# Patient Record
Sex: Male | Born: 1960 | Race: White | Hispanic: No | Marital: Married | Smoking: Never smoker
Health system: Southern US, Community
[De-identification: ages and names within clinical notes are randomized; demographics above are authoritative.]

## PROBLEM LIST (undated history)

## (undated) DIAGNOSIS — I639 Cerebral infarction, unspecified: Secondary | ICD-10-CM

## (undated) DIAGNOSIS — R413 Other amnesia: Secondary | ICD-10-CM

## (undated) DIAGNOSIS — M1990 Unspecified osteoarthritis, unspecified site: Secondary | ICD-10-CM

## (undated) DIAGNOSIS — I330 Acute and subacute infective endocarditis: Secondary | ICD-10-CM

## (undated) DIAGNOSIS — D6861 Antiphospholipid syndrome: Secondary | ICD-10-CM

## (undated) DIAGNOSIS — Z953 Presence of xenogenic heart valve: Secondary | ICD-10-CM

## (undated) DIAGNOSIS — M3211 Endocarditis in systemic lupus erythematosus: Secondary | ICD-10-CM

## (undated) DIAGNOSIS — I358 Other nonrheumatic aortic valve disorders: Secondary | ICD-10-CM

## (undated) DIAGNOSIS — I82409 Acute embolism and thrombosis of unspecified deep veins of unspecified lower extremity: Secondary | ICD-10-CM

## (undated) DIAGNOSIS — I825Z9 Chronic embolism and thrombosis of unspecified deep veins of unspecified distal lower extremity: Secondary | ICD-10-CM

## (undated) DIAGNOSIS — D696 Thrombocytopenia, unspecified: Secondary | ICD-10-CM

## (undated) HISTORY — PX: HERNIA REPAIR: SHX51

## (undated) HISTORY — DX: Other amnesia: R41.3

## (undated) HISTORY — DX: Chronic embolism and thrombosis of unspecified deep veins of unspecified distal lower extremity: I82.5Z9

## (undated) HISTORY — DX: Endocarditis in systemic lupus erythematosus: M32.11

## (undated) HISTORY — DX: Other nonrheumatic aortic valve disorders: I35.8

## (undated) HISTORY — DX: Acute and subacute infective endocarditis: I33.0

## (undated) HISTORY — DX: Thrombocytopenia, unspecified: D69.6

## (undated) HISTORY — DX: Antiphospholipid syndrome: D68.61

## (undated) HISTORY — PX: MOUTH SURGERY: SHX715

## (undated) HISTORY — DX: Cerebral infarction, unspecified: I63.9

---

## 1999-02-10 ENCOUNTER — Ambulatory Visit (HOSPITAL_COMMUNITY): Admission: RE | Admit: 1999-02-10 | Discharge: 1999-02-10 | Payer: Self-pay | Admitting: *Deleted

## 1999-02-10 ENCOUNTER — Encounter: Payer: Self-pay | Admitting: *Deleted

## 2003-05-07 ENCOUNTER — Ambulatory Visit (HOSPITAL_COMMUNITY): Admission: RE | Admit: 2003-05-07 | Discharge: 2003-05-07 | Payer: Self-pay | Admitting: *Deleted

## 2008-08-12 ENCOUNTER — Ambulatory Visit (HOSPITAL_COMMUNITY): Admission: RE | Admit: 2008-08-12 | Discharge: 2008-08-12 | Payer: Self-pay | Admitting: Surgery

## 2010-05-09 LAB — HEMOGLOBIN AND HEMATOCRIT, BLOOD
HCT: 46.6 % (ref 39.0–52.0)
Hemoglobin: 16 g/dL (ref 13.0–17.0)

## 2010-06-15 NOTE — Op Note (Signed)
NAME:  JURIEL, CID NO.:  [id number]   MEDICAL RECORD NO.:  [id number]          PATIENT TYPE:  AMB   LOCATION:  DAY                          FACILITY:  [facility]   PHYSICIAN:  Thornton Park. Daphine Deutscher, MD  DATE OF BIRTH:  1960/08/16   DATE OF PROCEDURE:  08/12/2008  DATE OF DISCHARGE:                               OPERATIVE REPORT   PREOPERATIVE DIAGNOSIS:  Left inguinal hernia.   POSTOPERATIVE DIAGNOSIS:  Left direct inguinal hernia.   PROCEDURE:  Left inguinal herniorrhaphy with Ultrapro mesh.   SURGEON:  Dr. Daphine Deutscher.   ASSISTANT:  None.   ANESTHESIA:  General by LMA.   DESCRIPTION OF PROCEDURE:  This is a 50 year old white male brought to  the OR 11 on Tuesday, August 12, 2008, and given general anesthesia.  The  abdomen was clipped by him earlier and then prepped with a CHT  equivalent and draped sterilely.  An oblique incision was made in the  left inguinal region which is where we had marked it preoperatively.  This was carried down to the external oblique.  He had a fairly thick  fatty panniculus, but once we got down there we could feel the bulge  coming out of his cord and incised the external oblique fibers down to  the ring and this revealed a large bulging mass medially.  I dissected  this off the cord and found it to be coming from about a size of a  nickel defect in the floor of his inguinal canal consistent with a large  direct defect.  I went ahead and closed this with a figure-of-eight  suture of 2-0 Prolene to keep everything reduced.  With it reduced, I  then oversewed this area with a piece of Ultrapro II mesh, suturing it  along the inguinal ligament with a running 2-0 Prolene, bringing it  around the cord structures.  It was sutured medially to the internal  oblique and then when it came around it was tucked behind the external  oblique and then sutured to itself with 2-0 Prolene.   The external oblique was then closed with a running 2-0  Vicryl  incorporating the mesh into that closure as well.  This area was  infiltrated with Marcaine and lidocaine and closed with 4-0 Vicryl with  Benzoin and Steri-Strips.  The patient seemed to tolerate the procedure  well.  He was taken to the recovery room in satisfactory condition.   FINAL DIAGNOSIS:  Left direct inguinal artery, status post open repair  with Ultrapro mesh.      Thornton Park Daphine Deutscher, MD  Electronically Signed     MBM/MEDQ  D:  08/12/2008  T:  08/12/2008  Job:  [id number]   cc:   Joycelyn Rua, M.D.  Fax: [phone]

## 2010-06-18 NOTE — Op Note (Signed)
NAME:  WESTIN, KNOTTS                    ACCOUNT NO.:  [id number]   MEDICAL RECORD NO.:  [id number]                   PATIENT TYPE:  AMB   LOCATION:  DAY                                  FACILITY:  [facility]   PHYSICIAN:  Vikki Ports, M.D.         DATE OF BIRTH:  [birth year]   DATE OF PROCEDURE:  05/07/2003  DATE OF DISCHARGE:                                 OPERATIVE REPORT   PREOPERATIVE DIAGNOSES:  Right inguinal hernia.   POSTOPERATIVE DIAGNOSES:  Right inguinal hernia.   PROCEDURE:  Right inguinal hernia repair with mesh.   SURGEON:  Vikki Ports, M.D.   ANESTHESIA:  General.   DESCRIPTION OF PROCEDURE:  The patient was taken to the operating room,  placed in a supine position and after adequate anesthesia was induced using  endotracheal tube, the abdomen was  prepped and draped in the normal sterile  fashion.  Using an oblique incision overlying the inguinal canal, I  dissected down to the external oblique fascia. This was opened along its  fibers.  The ilioinguinal nerve was identified and preserved.  The spermatic  cord was encircled with a Penrose drain at the pubic tubercle.  Using blunt  dissection, the shelving edge of Cooper's ligament and the inguinal ligament  were identified as was the transversalis fascia.  There was a fairly large  direct inguinal hernia which was reduced into the peritoneum and the floor  of Hasselbalch's triangle was loosely closed with #0 Vicryl sutures. A piece  of onlay mesh was then placed over the floor, tacked to the pubic tubercle  to the transversalis fascia and to the inguinal ligament, split and brought  out lateral to the spermatic cord at the internal ring.  It was also tacked  laterally using a running 2-0 Prolene suture. All tissues were injected  using 0.5 Marcaine, spermatic cord was again inspected with no evidence of  indirect hernia sac was seen.  The external oblique fascia was closed with a  running 3-0  Vicryl, skin incision was closed with staples, sterile dressing  was applied.  The patient tolerated the procedure well and went to PACU in  good condition.                                               Vikki Ports, M.D.    KRH/MEDQ  D:  05/07/2003  T:  05/07/2003  Job:  [id number]

## 2015-07-20 ENCOUNTER — Encounter: Payer: Self-pay | Admitting: Sports Medicine

## 2015-07-20 ENCOUNTER — Ambulatory Visit (INDEPENDENT_AMBULATORY_CARE_PROVIDER_SITE_OTHER): Payer: BLUE CROSS/BLUE SHIELD | Admitting: Sports Medicine

## 2015-07-20 VITALS — BP 137/88 | HR 69 | Ht 68.0 in | Wt 182.0 lb

## 2015-07-20 DIAGNOSIS — M79662 Pain in left lower leg: Secondary | ICD-10-CM | POA: Diagnosis not present

## 2015-07-20 NOTE — Progress Notes (Signed)
Patient ID: Brian Lloyd, male   DOB: 11-10-1960, 55 y.o.   MRN: [id number]  CC: L posterior knee/calf pain  HPI: Brian EbbsWarren W Lloyd is a 55 y.o. yo male with noncontributory PMH presenting with 10 days of L posterior knee and calf pain.  Pt states was in normal state of health ten days ago when had to push 250 lb generator up a hill and near the top, felt a sudden twinge or strain in the back of his L knee.  Hurt right away, but proceeded to mow the lawn (walking up and down hills) and that made it increasingly sore.  Back of knee got swollen the next day, L calf was "swollen and hard like a cantaloupe," no bruising, pain has remained but swelling has come down a lot.  Saw PCP several days after it happened, was told "blood clot" was "ruled out," L calf at that time measured 2.5 cm's larger than R.  Today pain is about the same level as when the injury occurred however the swelling is less, still feels like L calf is harder than R.  Able to walk but hurts, also hurts to sit for prolonged periods.  Using compression sleeve, ice, elevation, and hydrocodone when pain gets really bad, all with relief.    Past medical history reviewed Medications reviewed Allergies reviewed  ROS: Gen: - fevers/chills MSK: - except as per HPI Neuro: no change in gait  PE: BP 137/88 mmHg  Pulse 69  Ht 5\' 8"  (1.727 m)  Wt 182 lb (82.555 kg)  BMI 27.68 kg/m2 Gen: 55 y.o. yo male sitting one exam table in NAD HEENT: normocephalic, atraumatic Pulm: normal work of breathing MSK:  LLE: Patient is tender to palpation diffusely along the medial calf. There is mild soft tissue swelling diffusely of the left lower extremity. Lower extremity measures 1 cm larger than the uninvolved right leg. No palpable defect. Good pulses distally. Strength is intact. Negative Homans. Walking with a slight limp.  Imaging:  MSK-US: Limited images of the posterior left knee and calf were obtained. No Baker's cyst. There is  diffuse hypoechoic changes seen within the medial head of the gastrocnemius but no evidence of hematoma formation. Lateral gastrocnemius appears unremarkable.  A/P: Brian Lloyd is a 55 y.o. yo male presenting with L posterior knee/calf pain of uncertain etiology, possible plantaris rupture.    L posterior knee/calf pain Differential includes plantaris rupture, partial calf tear, vs Baker's cyst rupture. No clinical suspicion of DVT given pt presentation. -- Counseled pt that he most likely suffered a plantaris muscle rupture, US findings explained to pt -- Told it takes a month or so to heel, but he can expect full function of his leg afterwards -- Advised to continue using compression sleeve, do not stretch/apply heat to leg -- Provided with 5/16 inch bilateral heel lifts -- Ice and pain medication PRN. Continue with daily Celebrex (patient is on this chronically)  Follow-up: 2 weeks   Francie Massingominick Kirt Chew, MS4  Patient seen and evaluated with the medical student. I agree with the above plan of care. Patient likely suffered a rupture of his plantaris tendon. This is difficult to see on ultrasound. We will continue to treat him with compression, elevation, activity modification, and heel lifts. Follow-up in 2 weeks for reevaluation.

## 2015-08-01 DIAGNOSIS — I825Z2 Chronic embolism and thrombosis of unspecified deep veins of left distal lower extremity: Secondary | ICD-10-CM | POA: Insufficient documentation

## 2015-08-01 DIAGNOSIS — I825Z9 Chronic embolism and thrombosis of unspecified deep veins of unspecified distal lower extremity: Secondary | ICD-10-CM

## 2015-08-01 HISTORY — DX: Chronic embolism and thrombosis of unspecified deep veins of unspecified distal lower extremity: I82.5Z9

## 2015-08-10 ENCOUNTER — Encounter: Payer: Self-pay | Admitting: Sports Medicine

## 2015-08-10 ENCOUNTER — Ambulatory Visit (HOSPITAL_COMMUNITY)
Admission: RE | Admit: 2015-08-10 | Discharge: 2015-08-10 | Disposition: A | Discharge status: Home / Self Care | Payer: BLUE CROSS/BLUE SHIELD | Source: Ambulatory Visit | Attending: Vascular Surgery | Admitting: Vascular Surgery

## 2015-08-10 ENCOUNTER — Ambulatory Visit (INDEPENDENT_AMBULATORY_CARE_PROVIDER_SITE_OTHER): Payer: BLUE CROSS/BLUE SHIELD | Admitting: Sports Medicine

## 2015-08-10 VITALS — BP 124/78 | HR 59 | Ht 68.0 in | Wt 182.0 lb

## 2015-08-10 DIAGNOSIS — I82402 Acute embolism and thrombosis of unspecified deep veins of left lower extremity: Secondary | ICD-10-CM

## 2015-08-10 DIAGNOSIS — M79605 Pain in left leg: Secondary | ICD-10-CM | POA: Diagnosis present

## 2015-08-10 DIAGNOSIS — I82492 Acute embolism and thrombosis of other specified deep vein of left lower extremity: Secondary | ICD-10-CM | POA: Insufficient documentation

## 2015-08-10 DIAGNOSIS — I82412 Acute embolism and thrombosis of left femoral vein: Secondary | ICD-10-CM | POA: Insufficient documentation

## 2015-08-10 DIAGNOSIS — I82432 Acute embolism and thrombosis of left popliteal vein: Secondary | ICD-10-CM | POA: Diagnosis not present

## 2015-08-10 DIAGNOSIS — I82442 Acute embolism and thrombosis of left tibial vein: Secondary | ICD-10-CM | POA: Diagnosis not present

## 2015-08-10 MED ORDER — RIVAROXABAN (XARELTO) VTE STARTER PACK (15 & 20 MG): ORAL_TABLET | ORAL | Status: DC

## 2015-08-10 NOTE — Progress Notes (Signed)
   Subjective:    Patient ID: Brian Lloyd, male    DOB: 18-Dec-1960, 55 y.o.   MRN: [id number]  HPI  L calf pain - Pain is much better - swelling if persistent, below calf and around ankle - Initially injured about 5 wks ago, while lifting generator - Sometimes has tingling related to swelling in leg - Using compression sleeve has helped - swelling worse with standing better with elevation - No h/o bruising, redness in that area  Review of Systems  Constitutional: Negative for fever and chills.  Respiratory: Negative for shortness of breath.   Musculoskeletal: Negative for myalgias and joint swelling.  Skin: Negative for color change and rash.  Neurological: Negative for numbness.      Objective:   Physical Exam  Constitutional: He appears well-developed and well-nourished.  HENT:  Head: Normocephalic and atraumatic.  Cardiovascular:  Pulses:      Dorsalis pedis pulses are 2+ on the right side, and 2+ on the left side.       Posterior tibial pulses are 1+ on the right side, and 1+ on the left side.  Musculoskeletal:       Left lower leg: He exhibits swelling and edema. He exhibits no tenderness, no bony tenderness, no deformity and no laceration.  L calf and ankle are swollen with 1+ pitting edema to mid calf.  L calf is non tender but is more tense than his R calf.  Few varicose veins in b/l LEs.  Negative homans sign.  Full ROM at knee and ankle.        Assessment & Plan:  Left Leg Swelling - Initially injured while lifting.  Pain has resolved but swelling persists - Considering persistent swelling without pain will r/o DVT with doppler today - From a MSK standpoint patient seems to be almost fully recovered from calf strain/muscle tear.  If doppler is negative he can continue wearing his compression sleeve prn and slow get back to normal activity.  - Will call patient with results of doppler  Addendum: Doppler is positive for DVT. I will go ahead and start the  patient on Xarelto and have him follow-up with his primary care physician, Dr. Tommi Lloyd, tomorrow morning. Patient is advised to seek treatment immediately in the emergency room if he develops any acute shortness of breath or chest pain. He understands.

## 2015-10-01 ENCOUNTER — Other Ambulatory Visit: Payer: Self-pay | Admitting: Physician Assistant

## 2015-10-01 DIAGNOSIS — H539 Unspecified visual disturbance: Secondary | ICD-10-CM

## 2015-10-06 ENCOUNTER — Ambulatory Visit (INDEPENDENT_AMBULATORY_CARE_PROVIDER_SITE_OTHER): Payer: BLUE CROSS/BLUE SHIELD

## 2015-10-06 DIAGNOSIS — I639 Cerebral infarction, unspecified: Secondary | ICD-10-CM

## 2015-10-06 DIAGNOSIS — I638 Other cerebral infarction: Secondary | ICD-10-CM

## 2015-10-06 DIAGNOSIS — H539 Unspecified visual disturbance: Secondary | ICD-10-CM

## 2015-10-06 HISTORY — DX: Cerebral infarction, unspecified: I63.9

## 2015-10-06 MED ORDER — GADOBENATE DIMEGLUMINE 529 MG/ML IV SOLN
17.0000 mL | Freq: Once | INTRAVENOUS | Status: AC | PRN
  Administered 2015-10-06: 17 mL via INTRAVENOUS

## 2015-10-07 ENCOUNTER — Other Ambulatory Visit: Payer: Self-pay | Admitting: Physician Assistant

## 2015-10-07 DIAGNOSIS — Z8673 Personal history of transient ischemic attack (TIA), and cerebral infarction without residual deficits: Secondary | ICD-10-CM

## 2015-10-07 DIAGNOSIS — Z86718 Personal history of other venous thrombosis and embolism: Secondary | ICD-10-CM

## 2015-10-08 ENCOUNTER — Other Ambulatory Visit: Payer: Self-pay | Admitting: Physician Assistant

## 2015-10-08 DIAGNOSIS — Z8673 Personal history of transient ischemic attack (TIA), and cerebral infarction without residual deficits: Secondary | ICD-10-CM

## 2015-10-09 ENCOUNTER — Ambulatory Visit (HOSPITAL_BASED_OUTPATIENT_CLINIC_OR_DEPARTMENT_OTHER): Payer: BLUE CROSS/BLUE SHIELD

## 2015-10-09 ENCOUNTER — Ambulatory Visit (HOSPITAL_COMMUNITY)
Admission: RE | Admit: 2015-10-09 | Discharge: 2015-10-09 | Disposition: A | Discharge status: Home / Self Care | Payer: BLUE CROSS/BLUE SHIELD | Source: Ambulatory Visit | Attending: Cardiology | Admitting: Cardiology

## 2015-10-09 ENCOUNTER — Other Ambulatory Visit: Payer: Self-pay

## 2015-10-09 ENCOUNTER — Other Ambulatory Visit: Payer: Self-pay | Admitting: Physician Assistant

## 2015-10-09 DIAGNOSIS — H539 Unspecified visual disturbance: Secondary | ICD-10-CM

## 2015-10-09 DIAGNOSIS — Z86718 Personal history of other venous thrombosis and embolism: Secondary | ICD-10-CM | POA: Diagnosis not present

## 2015-10-09 DIAGNOSIS — R42 Dizziness and giddiness: Secondary | ICD-10-CM | POA: Diagnosis not present

## 2015-10-09 DIAGNOSIS — Z8673 Personal history of transient ischemic attack (TIA), and cerebral infarction without residual deficits: Secondary | ICD-10-CM

## 2015-10-09 DIAGNOSIS — I6523 Occlusion and stenosis of bilateral carotid arteries: Secondary | ICD-10-CM | POA: Diagnosis not present

## 2015-10-09 HISTORY — PX: TRANSTHORACIC ECHOCARDIOGRAM: SHX275

## 2015-10-16 ENCOUNTER — Telehealth: Payer: Self-pay | Admitting: Cardiology

## 2015-10-16 ENCOUNTER — Other Ambulatory Visit (HOSPITAL_COMMUNITY)
Admission: AD | Admit: 2015-10-16 | Discharge: 2015-10-16 | Disposition: A | Discharge status: Home / Self Care | Payer: BLUE CROSS/BLUE SHIELD | Source: Ambulatory Visit | Attending: Physician Assistant | Admitting: Physician Assistant

## 2015-10-16 DIAGNOSIS — Z029 Encounter for administrative examinations, unspecified: Secondary | ICD-10-CM | POA: Diagnosis not present

## 2015-10-16 NOTE — Telephone Encounter (Signed)
Patient had an echocardiogram done with bubble study to evaluate possible TIA/stroke type symptoms. The study was essentially normal with the exception of concerning finding for possible aortic vegetation. Patient has not had any symptoms to suggest endocarditis, but would warrant to assess this aortic valve finding.  We will arrange initial consult visit forroughly Monday morning to schedule TEE.  Bryan Lemmaavid Javana Schey, MD

## 2015-10-16 NOTE — Telephone Encounter (Signed)
DR HARDING SPOKE TO MR HELPER PA CONCERNING PATIENT.  PER DR HARDING, SCHEDULE AN APPOINTMENT FOR 10/19/15. MR HELPER WILL HAVE PATIENT TO GET ABLODD CULTURES.  OUR OFFICE WILL CONTACT PATIENT ABOUT APPOINTMENT

## 2015-10-16 NOTE — Telephone Encounter (Signed)
Called patient and gave him date, time and location of appointment on 10-19-15 at 10:30 a.m.

## 2015-10-19 ENCOUNTER — Encounter: Payer: Self-pay | Admitting: Cardiology

## 2015-10-19 ENCOUNTER — Ambulatory Visit (INDEPENDENT_AMBULATORY_CARE_PROVIDER_SITE_OTHER): Payer: BLUE CROSS/BLUE SHIELD | Admitting: Cardiology

## 2015-10-19 VITALS — BP 122/90 | Ht 68.0 in | Wt 191.2 lb

## 2015-10-19 DIAGNOSIS — I639 Cerebral infarction, unspecified: Secondary | ICD-10-CM

## 2015-10-19 DIAGNOSIS — I825Z2 Chronic embolism and thrombosis of unspecified deep veins of left distal lower extremity: Secondary | ICD-10-CM

## 2015-10-19 DIAGNOSIS — I358 Other nonrheumatic aortic valve disorders: Secondary | ICD-10-CM | POA: Insufficient documentation

## 2015-10-19 DIAGNOSIS — Z01818 Encounter for other preprocedural examination: Secondary | ICD-10-CM

## 2015-10-19 DIAGNOSIS — I359 Nonrheumatic aortic valve disorder, unspecified: Secondary | ICD-10-CM

## 2015-10-19 DIAGNOSIS — M3211 Endocarditis in systemic lupus erythematosus: Secondary | ICD-10-CM

## 2015-10-19 DIAGNOSIS — Z8673 Personal history of transient ischemic attack (TIA), and cerebral infarction without residual deficits: Secondary | ICD-10-CM

## 2015-10-19 HISTORY — DX: Endocarditis in systemic lupus erythematosus: M32.11

## 2015-10-19 LAB — BASIC METABOLIC PANEL
BUN: 21 mg/dL (ref 7–25)
CALCIUM: 9.7 mg/dL (ref 8.6–10.3)
CO2: 27 mmol/L (ref 20–31)
Chloride: 100 mmol/L (ref 98–110)
Creat: 1.11 mg/dL (ref 0.70–1.33)
GLUCOSE: 98 mg/dL (ref 65–99)
Potassium: 4.6 mmol/L (ref 3.5–5.3)
SODIUM: 137 mmol/L (ref 135–146)

## 2015-10-19 NOTE — Assessment & Plan Note (Signed)
Remains on Xarelto. Will defer to PCP, but for now would continue Xarelto until more data comes out from the TEE

## 2015-10-19 NOTE — Progress Notes (Signed)
PCP: Ethel Rana  Clinic Note: Chief Complaint  Patient presents with  . New Evaluation    pt had blood clot after leg injury  . 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 male with a PMH below who presents today for Cardiology evaluation after an echocardiogram performed for stroke evaluation showed an aortic valve lesion. I was notified by his PCP ([name]) of the results of the echocardiogram and we arranged for him to be seen in follow-up for cardiology consultation.  Brian Lloyd has been treated by Dr. Margaretha Sheffield (Sports Med) for L Calf pain following an injury l(from lifting a heavy generator)- torn tendon, complicated by a DVT --> on Xarelto x 2 months now. He has not yet been to neurology for evaluation  Recent Hospitalizations: ? 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 No mass effect or hemorrhage.  2-D echo 10/09/2015: Mild LVH. EF 60-65%. Pseudo-normal grade 2 diastolic dysfunction. 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 TEE recommended.  Carotid Dopplers 10/09/2015: Homogenous plaque in the right carotid with mild intimal thickening on the left. Less than 40% bilateral stenosis. Normal subclavian arteries. Patent vertebral arteries with antegrade flow.  Interval History: Brian Lloyd has been a relatively healthy gentleman until he had his injury a few months ago. Before that he would walk 20-40 minutes a day up and down hills without any major problems. However he just try going back to doing that a week or so ago and really had a hard time getting back to his previous level. The major issue other than his injury was this recent cerebellar infarct that there is now really only limited his vertical visual field depth perception. When  it first happened a few weeks ago, he really noted almost walking with a list that turn in a circle. Since then he has had some vertigo, but no balance issues. Interestingly, he was on Xarelto when he had the stroke.  With the diagnosis of possible aortic valve vegetation, we checked the cultures which are no growth to date. He has not had any signs of endocarditis such as fevers or chills, fatigue, and no bruising or other unusual symptoms besides his stroke.  Restrict the cardiac standpoint, he is relatively a symptomatically Review of Symptoms as follows: No chest pain or shortness of breath with rest or exertion. He is somewhat deconditioned and had a hard time walking this last time. But no other symptoms to suggest angina or exertional dyspnea. No PND, orthopnea or edema. No palpitations, lightheadedness, dizziness, weakness or syncope/near syncope. No further TIA or amaurosis fugax symptoms. No melena, hematochezia, hematuria, or epstaxis. No claudication.  ROS: A comprehensive was performed. Review of Systems  Constitutional: Negative for chills, fever and malaise/fatigue.  HENT: Negative for nosebleeds.   Eyes:       Poor vertical depth perception  Respiratory: Negative for sputum production and wheezing.   Cardiovascular: Positive for leg swelling (still has mild swelling from recent DVT).       Per history of present illness  Gastrointestinal: Negative for heartburn (Depending on what he eats).  Genitourinary: Negative for hematuria.  Musculoskeletal: Negative for falls, joint pain and myalgias.  Neurological: Positive for dizziness (Vertigo from visual perception) and headaches (A bit more  since his stroke than usual.). Negative for seizures and weakness.  Psychiatric/Behavioral: Negative for depression and memory loss. The patient is nervous/anxious (A bit anxious about all these diagnoses coming on.). The patient does not have insomnia.     Past Medical History:  Diagnosis  Date  . 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 HERNIA REPAIR Bilateral   . MOUTH SURGERY     root canal  . TRANSTHORACIC ECHOCARDIOGRAM  10/09/2015   Mild LVH. EF 60-65%. Pseudo-normal grade 2 diastolic dysfunction. 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 TEE recommended.    Prior to Admission medications   Medication Sig Start Date Authorizing Provider  Rivaroxaban 15 & 20 MG TBPK Take as directed on package: Start with one 15mg  tablet by mouth twice a day with food. On Day 22, switch to one 20mg  tablet daily with food. 08/10/15 Ralene Cork, DO    Social History   Social History  . Marital status: Married    Spouse name: N/A  . Number of children: N/A  . Years of education: N/A   Social History Main Topics  . Smoking status: Never Smoker  . Smokeless tobacco: Never Used  . Alcohol use None  . Drug use: Unknown  . Sexual activity: Not Asked   Other Topics Concern  . None   Social History Narrative  . None   Family History  Problem Relation Age of Onset  . Heart disease Father   . Hypertension Father   . Heart disease Maternal Grandfather   . Hypertension Maternal Grandfather   . Diabetes Paternal Grandfather   . Heart disease Paternal Grandfather     Wt Readings from Last 3 Encounters:  10/19/15 191 lb 3.2 oz (86.7 kg)  08/10/15 182 lb (82.6 kg)  07/20/15 182 lb (82.6 kg)    PHYSICAL EXAM BP 122/90 (BP Location: Right Arm, Patient Position: Sitting, Cuff Size: Normal)   Ht 5\' 8"  (1.727 m)   Wt 191 lb 3.2 oz (86.7 kg)   BMI 29.07 kg/m  General appearance: alert, cooperative, appears stated age, no distres2s and borderline obese, otherwise well-nourished and well-groomed. Neck: no adenopathy,  no carotid bruit and no JVD Lungs: clear to auscultation bilaterally, normal percussion bilaterally and non-labored Heart: regular rate and rhythm, S1 & S2 normal, no murmur, click, rub or gallop; nondisplaced PMI Abdomen: soft, non-tender; bowel sounds normal; no masses,  no organomegaly; no HJR Extremities: extremities normal, atraumatic, no cyanosis or edema  Pulses: 2+ and symmetric;  Skin: mobility and turgor normal, no edema, no evidence of bleeding or bruising and no lesions noted Neurologic: Mental status: Alert, oriented, thought content appropriate Cranial nerves: normal (II-XII grossly intact)    Adult ECG Report  Rate: 63 ;  Rhythm: normal sinus rhythm and Normal axis, intervals and durations.;   Narrative Interpretation: Normal EKG   Other studies Reviewed: Additional studies/ records that were reviewed today include:  Recent Labs:  n/a    ASSESSMENT / PLAN: Problem List Items Addressed This Visit    History of CVA (cerebrovascular accident) without residual deficits   Relevant Orders   ECHO TEE   Basic metabolic panel   CBC   Protime-INR   EKG 12-Lead   DVT,  lower extremity, distal, chronic (HCC) (Chronic)    Remains on Xarelto. Will defer to PCP, but for now would continue Xarelto until more data comes out from the TEE      Cerebellar infarct [facility](HCC)    He really has only ever visual disturbance from residual effect of his stroke. Depending on what we see on the TEE, may consider continuing DOAC treatment with Xarelto versus aspirin.  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 Blood cultures are negative to date. HACEK orgainisms not checked, but in the absence of any febrile type illness to suggest endocarditis, this is unlikely. He may have had endocarditis in the past, but the last time he has had a prolonged febrile illness was several years ago. The last time he had any  dental work done was also couple years ago.  Plan: Based on echo report, will order TEE to better assess. It could likely be a myxoma or fibroblastoma. Having a stroke, we will then need to consider future treatment based on these findings. He may even require surgical removal. For now, I will continue on Xarelto until we have the results -- He will hold Xarelto the morning of his TEE.  I anticipate that the mass would be more likely a Myxoma vs. Fibroelastoma as vegetation is less likely in this clinical scenario.  Regardless, with his presentation being related to likely embolism/CVA, surgical resection is likely the next step. (Would consult CT Surgery following TEE if indicated)      Relevant Orders   ECHO TEE   Basic metabolic panel   CBC   Protime-INR   EKG 12-Lead    Other Visit Diagnoses    Pre-op testing       Relevant Orders   ECHO TEE   Basic metabolic panel   CBC   Protime-INR   EKG 12-Lead      Current medicines are reviewed at length with the patient today. (+/- concerns) n/a The following changes have been made: n/a Studies Ordered:   Orders Placed This Encounter  Procedures  . Basic metabolic panel  . CBC  . Protime-INR  . EKG 12-Lead  . ECHO TEE   SCHEDULE TEE AT [facility]  NEED LABS - PT ,CBC, BMP PRIOR TO PROCEDURE  PLEASE HOLD XARELTO THE MORNING OF TEE PROCEDURE  ROV 1 month with APP or Dr. Herbie BaltimoreHarding.   Bryan Lemmaavid Eddison Searls, M.D., M.S. Interventional Cardiologist   Pager # [phone] Phone # [phone] [address] [place], [place] [address]

## 2015-10-19 NOTE — Assessment & Plan Note (Signed)
He really has only ever visual disturbance from residual effect of his stroke. Depending on what we see on the TEE, may consider continuing DOAC treatment with Xarelto versus aspirin.  Defer to PCP/neurology

## 2015-10-19 NOTE — Progress Notes (Signed)
Instructions given

## 2015-10-19 NOTE — Patient Instructions (Addendum)
SCHEDULE TEE AT [facility]  NEED LABS - PT ,CBC, BMP PRIOR TO PROCEDURE  PLEASE HOLD XARELTO THE MORNING OF TEE PROCEDURE Your physician has requested that you have a TEE. During a TEE, sound waves are used to create images of your heart. It provides your doctor with information about the size and shape of your heart and how well your heart's chambers and valves are working. In this test, a transducer is attached to the end of a flexible tube that's guided down your throat and into your esophagus (the tube leading from you mouth to your stomach) to get a more detailed image of your heart. You are not awake for the procedure. Please see the instruction sheet given to you today. For further information please visit [email or web address].   Your physician recommends that you schedule a follow-up appointment in: 1 MONTH WITH EXTENDER OR DR HARDING.

## 2015-10-19 NOTE — Assessment & Plan Note (Addendum)
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 Blood cultures are negative to date. HACEK orgainisms not checked, but in the absence of any febrile type illness to suggest endocarditis, this is unlikely. He may have had endocarditis in the past, but the last time he has had a prolonged febrile illness was several years ago. The last time he had any dental work done was also couple years ago.  Plan: Based on echo report, will order TEE to better assess. It could likely be a myxoma or fibroblastoma. Having a stroke, we will then need to consider future treatment based on these findings. He may even require surgical removal. For now, I will continue on Xarelto until we have the results -- He will hold Xarelto the morning of his TEE.  I anticipate that the mass would be more likely a Myxoma vs. Fibroelastoma as vegetation is less likely in this clinical scenario.  Regardless, with his presentation being related to likely embolism/CVA, surgical resection is likely the next step. (Would consult CT Surgery following TEE if indicated)

## 2015-10-20 LAB — CBC
HCT: 41.3 % (ref 38.5–50.0)
HEMOGLOBIN: 14.2 g/dL (ref 13.2–17.1)
MCH: 29.2 pg (ref 27.0–33.0)
MCHC: 34.4 g/dL (ref 32.0–36.0)
MCV: 85 fL (ref 80.0–100.0)
MPV: 11.1 fL (ref 7.5–12.5)
RBC: 4.86 MIL/uL (ref 4.20–5.80)
RDW: 13.5 % (ref 11.0–15.0)
WBC: 5.1 10*3/uL (ref 3.8–10.8)

## 2015-10-20 LAB — PROTIME-INR
INR: 1.3 — AB
PROTHROMBIN TIME: 13.9 s — AB (ref 9.0–11.5)

## 2015-10-21 ENCOUNTER — Other Ambulatory Visit: Payer: Self-pay | Admitting: *Deleted

## 2015-10-21 DIAGNOSIS — Z01818 Encounter for other preprocedural examination: Secondary | ICD-10-CM

## 2015-10-21 LAB — CULTURE, BLOOD (ROUTINE X 2)
CULTURE: NO GROWTH
Culture: NO GROWTH

## 2015-10-22 ENCOUNTER — Ambulatory Visit (HOSPITAL_COMMUNITY)
Admission: RE | Admit: 2015-10-22 | Discharge: 2015-10-22 | Disposition: A | Discharge status: Home / Self Care | Payer: BLUE CROSS/BLUE SHIELD | Source: Ambulatory Visit | Attending: Internal Medicine | Admitting: Internal Medicine

## 2015-10-22 ENCOUNTER — Encounter (HOSPITAL_COMMUNITY)
Admission: RE | Disposition: A | Discharge status: Home / Self Care | Payer: Self-pay | Source: Ambulatory Visit | Attending: Internal Medicine

## 2015-10-22 ENCOUNTER — Ambulatory Visit (HOSPITAL_BASED_OUTPATIENT_CLINIC_OR_DEPARTMENT_OTHER): Payer: BLUE CROSS/BLUE SHIELD

## 2015-10-22 ENCOUNTER — Encounter (HOSPITAL_COMMUNITY): Payer: Self-pay

## 2015-10-22 DIAGNOSIS — I350 Nonrheumatic aortic (valve) stenosis: Secondary | ICD-10-CM | POA: Insufficient documentation

## 2015-10-22 DIAGNOSIS — I351 Nonrheumatic aortic (valve) insufficiency: Secondary | ICD-10-CM

## 2015-10-22 DIAGNOSIS — Z01818 Encounter for other preprocedural examination: Secondary | ICD-10-CM

## 2015-10-22 DIAGNOSIS — Z86718 Personal history of other venous thrombosis and embolism: Secondary | ICD-10-CM | POA: Insufficient documentation

## 2015-10-22 DIAGNOSIS — Z8673 Personal history of transient ischemic attack (TIA), and cerebral infarction without residual deficits: Secondary | ICD-10-CM | POA: Insufficient documentation

## 2015-10-22 HISTORY — PX: TEE WITHOUT CARDIOVERSION: SHX5443

## 2015-10-22 SURGERY — ECHOCARDIOGRAM, TRANSESOPHAGEAL
Anesthesia: Moderate Sedation

## 2015-10-22 MED ORDER — DIPHENHYDRAMINE HCL 50 MG/ML IJ SOLN
INTRAMUSCULAR | Status: AC
  Filled 2015-10-22: qty 1

## 2015-10-22 MED ORDER — MIDAZOLAM HCL 5 MG/ML IJ SOLN
INTRAMUSCULAR | Status: AC
  Filled 2015-10-22: qty 2

## 2015-10-22 MED ORDER — LIDOCAINE VISCOUS 2 % MT SOLN
OROMUCOSAL | Status: DC | PRN
  Administered 2015-10-22: 10 mL via OROMUCOSAL

## 2015-10-22 MED ORDER — FENTANYL CITRATE (PF) 100 MCG/2ML IJ SOLN
INTRAMUSCULAR | Status: DC | PRN
  Administered 2015-10-22: 25 ug via INTRAVENOUS
  Administered 2015-10-22: 12.5 ug via INTRAVENOUS
  Administered 2015-10-22: 25 ug via INTRAVENOUS

## 2015-10-22 MED ORDER — MIDAZOLAM HCL 10 MG/2ML IJ SOLN
INTRAMUSCULAR | Status: DC | PRN
  Administered 2015-10-22 (×2): 2 mg via INTRAVENOUS
  Administered 2015-10-22: 1 mg via INTRAVENOUS
  Administered 2015-10-22: 2 mg via INTRAVENOUS

## 2015-10-22 MED ORDER — FENTANYL CITRATE (PF) 100 MCG/2ML IJ SOLN
INTRAMUSCULAR | Status: AC
  Filled 2015-10-22: qty 2

## 2015-10-22 MED ORDER — SODIUM CHLORIDE 0.9 % IV SOLN
INTRAVENOUS | Status: DC
  Administered 2015-10-22: 500 mL via INTRAVENOUS

## 2015-10-22 MED ORDER — LIDOCAINE VISCOUS 2 % MT SOLN
OROMUCOSAL | Status: AC
  Filled 2015-10-22: qty 15

## 2015-10-22 NOTE — Discharge Instructions (Signed)

## 2015-10-22 NOTE — Op Note (Signed)
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 There is mild AI MV is normal  No signif MR TV is normal  Mild TR PV is normal  Trace PI LA LAA without masses NO PFO as tested by color doppler and with injection of agitated saline. LVEF is normal Thoracic aorta is normal.

## 2015-10-22 NOTE — Progress Notes (Signed)
  Echocardiogram Echocardiogram Transesophageal has been performed.  Janalyn HarderWest, Mekel Haverstock R 10/22/2015, 8:56 AM

## 2015-10-23 ENCOUNTER — Other Ambulatory Visit: Payer: Self-pay | Admitting: *Deleted

## 2015-10-23 ENCOUNTER — Encounter (HOSPITAL_COMMUNITY): Payer: Self-pay | Admitting: Internal Medicine

## 2015-10-23 ENCOUNTER — Ambulatory Visit (INDEPENDENT_AMBULATORY_CARE_PROVIDER_SITE_OTHER): Payer: BLUE CROSS/BLUE SHIELD | Admitting: Physician Assistant

## 2015-10-23 ENCOUNTER — Encounter: Payer: Self-pay | Admitting: Cardiovascular Disease

## 2015-10-23 VITALS — BP 126/76 | HR 63 | Ht 68.0 in | Wt 190.8 lb

## 2015-10-23 DIAGNOSIS — Z01818 Encounter for other preprocedural examination: Secondary | ICD-10-CM

## 2015-10-23 DIAGNOSIS — Z01811 Encounter for preprocedural respiratory examination: Secondary | ICD-10-CM

## 2015-10-23 DIAGNOSIS — D689 Coagulation defect, unspecified: Secondary | ICD-10-CM | POA: Diagnosis not present

## 2015-10-23 DIAGNOSIS — I82402 Acute embolism and thrombosis of unspecified deep veins of left lower extremity: Secondary | ICD-10-CM

## 2015-10-23 DIAGNOSIS — I6310 Cerebral infarction due to embolism of unspecified precerebral artery: Secondary | ICD-10-CM

## 2015-10-23 DIAGNOSIS — I359 Nonrheumatic aortic valve disorder, unspecified: Secondary | ICD-10-CM | POA: Diagnosis not present

## 2015-10-23 DIAGNOSIS — I358 Other nonrheumatic aortic valve disorders: Secondary | ICD-10-CM

## 2015-10-23 NOTE — Patient Instructions (Addendum)
Your physician has requested that you have a cardiac catheterization. Cardiac catheterization is used to diagnose and/or treat various heart conditions. Doctors may recommend this procedure for a number of different reasons. The most common reason is to evaluate chest pain. Chest pain can be a symptom of coronary artery disease (CAD), and cardiac catheterization can show whether plaque is narrowing or blocking your heart's arteries. This procedure is also used to evaluate the valves, as well as measure the blood flow and oxygen levels in different parts of your heart. For further information please visit [email or web address].   Following your catheterization, you will not be allowed to drive for 3 days.  No lifting, pushing, or pulling greater that 10 pounds is allowed for 1 week.  You will be required to have the following tests prior to the procedure:  1. Blood work-the blood work can be done no more than 7 days prior to the procedure.  It can be done at any [facility] lab.  There is one downstairs on the first floor of this building and one in the [facility] building [phone]([address], [address]).  2. Chest Xray-the chest xray order has [facility].      HOLD YOUR RIVAROXABAN 24 HOURS PRIOR TO THE CATH.  ( The condition that you want to look up is " FIBROELASTOMA" )

## 2015-10-23 NOTE — Progress Notes (Addendum)
Cardiology Office Note    Date:  10/23/2015   ID:  Brian Lloyd, Brian Lloyd [birth year], MRN [id number]  PCP:  Brian Lloyd  Cardiologist:  Dr. Herbie Lloyd  Chief Complaint  Patient presents with  . Follow-up    seen for Dr. Herbie Lloyd    History of Present Illness:  Brian Lloyd is a 55 y.o. male with PMH of DVT and CVA. He has also been treated by Dr. Margaretha Lloyd with sports medicine for left calf pain after injury which resulted in one tendon, complicated by DVT in July 2017. Patient was seen by Dr. Herbie Lloyd on 10/19/2015 for evaluation after echocardiogram performed for stroke evaluation showed aortic valve lesion. 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 MRI obtained on the same day showed small acute to subacute lacunar infarct in the left cerebellum. No mass effect or hemorrhage. 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 Carotid Doppler obtained on 9/80/2017 showed homogeneous plaque in the right carotid with mild intimal thickening on the left, less than 40% bilaterally.   He underwent outpatient transesophageal echocardiogram on 10/22/2015 showed large mobile mass along the ventricular surface of AV measuring 2 x 1 cm, service was very irregular, appears to be attached to known coronary cusp near the base. Suspicious of fibroelastoma. No evidence of thrombosis.  I have discussed with Dr. Herbie Lloyd and the patient and the her wife regarding benefit and risk of the cardiac catheterization. They are willing to proceed, however they have very concerned that this he has had at least 3 labs so far and his platelet level is still unknown at this point as all the blood draws have platelet clumping.    Past Medical History:  Diagnosis Date  . Cerebellar infarct (HCC) 10/06/2015   Cardiac MRI showed  acute to subacute lacunar infarct in the left cerebellum  . 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 HERNIA REPAIR Bilateral   . MOUTH SURGERY     root canal  . TEE WITHOUT CARDIOVERSION N/A 10/22/2015   Procedure: TRANSESOPHAGEAL ECHOCARDIOGRAM (TEE);  Surgeon: Brian Riffle, MD;  Location: [facility] ENDOSCOPY;  Service: Cardiovascular;  Laterality: N/A;  . TRANSTHORACIC ECHOCARDIOGRAM  10/09/2015   Mild LVH. EF 60-65%. Pseudo-normal grade 2 diastolic dysfunction. 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 TEE recommended.    Current Medications: Outpatient Medications Prior to Visit  Medication Sig Dispense Refill  . Rivaroxaban 15 & 20 MG TBPK Take as directed on package: Start with one 15mg  tablet by mouth twice a day with food. On Day 22, switch to one 20mg  tablet daily with food. 51 each 0   No facility-administered medications prior to visit.      Allergies:   Naproxen   Social History   Social History  . Marital status: Married    Spouse name: N/A  . Number of children: N/A  . Years of education: N/A   Social History Main Topics  . Smoking status: Never Smoker  . Smokeless tobacco: Never Used  . Alcohol use None     Comment: rarely  . Drug use: Unknown  . Sexual activity: Not Asked   Other Topics Concern  . None   Social History Narrative  .  None     Family History:  The patient's family history includes Diabetes in his paternal grandfather; Heart disease in his father, maternal grandfather, and paternal grandfather; Hypertension in his father and maternal grandfather.   ROS:   Please see the history of present illness.    ROS All other systems reviewed and are negative.   PHYSICAL EXAM:   VS:  BP 126/76   Pulse 63   Ht 5\' 8"  (1.727 m)   Wt 190 lb 12.8 oz (86.5 kg)   BMI 29.01 kg/m    GEN: Well nourished, well  developed, in no acute distress  HEENT: normal  Neck: no JVD, carotid bruits, or masses Cardiac: RRR; no murmurs, rubs, or gallops,no edema  Respiratory:  clear to auscultation bilaterally, normal work of breathing GI: soft, nontender, nondistended, + BS MS: no deformity or atrophy  Skin: warm and dry, no rash Neuro:  Alert and Oriented x 3, Strength and sensation are intact Psych: euthymic mood, full affect  Wt Readings from Last 3 Encounters:  10/23/15 190 lb 12.8 oz (86.5 kg)  10/19/15 191 lb 3.2 oz (86.7 kg)  08/10/15 182 lb (82.6 kg)      Studies/Labs Reviewed:   EKG:  EKG is not ordered today.    Recent Labs: 10/19/2015: BUN 21; Creat 1.11; Hemoglobin 14.2; Platelets SEE NOTE; Potassium 4.6; Sodium 137   Lipid Panel No results found for: CHOL, TRIG, HDL, CHOLHDL, VLDL, LDLCALC, LDLDIRECT  Additional studies/ records that were reviewed today include:   Echo 10/09/2015 LV EF: 60% -   65%  ------------------------------------------------------------------- Indications:      Z86.73 History of CVA.  BUBBLE STUDY.  ------------------------------------------------------------------- History:   PMH:  Acquired from the patient and from the patient&'s chart.  ------------------------------------------------------------------- Study Conclusions  - Left ventricle: The cavity size was normal. Wall thickness was   increased in a pattern of mild LVH. Systolic function was normal.   The estimated ejection fraction was in the range of 60% to 65%.   Wall motion was normal; there were no regional wall motion   abnormalities. Features are consistent with a pseudonormal left   ventricular filling pattern, with concomitant abnormal relaxation   and increased filling pressure (grade 2 diastolic dysfunction). - Aortic valve: There was a medium-sized, 1.4 cm (L) x 0.6 cm (W)   vegetation on the left ventricular aspect. - Atrial septum: No defect or patent foramen ovale was  identified.  Impressions:  - There is a medium sized mass on the aortic valve that is c/w a   vegetation.   Consider TEE for further evaluation if clinically indicated.   TEE 10/22/2015 - Aortic valve: Large mobile mass along ventricular surface of AV   Measures 2 x 1 cm. Surface is very irregular (frondlike) Appears   to be attached to noncoronary cusp, near base. Base of this cusp   is thickened. Suspicious for fibroelastoma. Mild AI. - Left atrium: No evidence of thrombus in the atrial cavity or   appendage.    ASSESSMENT:    1. Aortic valve mass   2. Pre-op testing   3. Blood clotting disorder (HCC)   4. Pre-op chest exam   5. DVT (deep venous thrombosis), left   6. 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 Aortic valve mass: Found incidentally as part of workup for recent stroke on TTE, TEE performed yesterday was concerning  for fibroelastoma. Apparently this case has been discussed with Dr. Cornelius Moras in the hospital. The plan is for the patient to undergo preoperative cardiac catheterization for assessment of coronary anatomy prior to possible surgery.  - Risk and benefit of procedure explained to the patient who display clear understanding and agree to proceed. Discussed with patient possible procedural risk include bleeding, vascular injury, renal injury, arrythmia, MI, stroke and loss of limb or life.  - We have tentatively arranged for cardiac catheterization while waiting for further assessment of his platelet level. I have personally talked with Arlys John in our cath lab to make sure that the interventional cardiologist is aware that we should NOT cross the valve during cardiac catheterization as this will increase his chance of stroke.  - I have not placed his cath order yet, I plan to do so after I figure out what to do with his platelet on Monday.  - he will need to hold his Xarelto for 24  hour prior to cath and 48 hour prior to any surgery.  2. Possible clotting disorder: Per wife, he has had 3 recent CBC, all shows clumped platelets on the smear therefore unable to assess total platelet level. She is very concerned about this. Although hypercoagulable clotting disorder can potentially cause his recent DVT and stroke, the presence of aortic mass was still warrant surgical consultation. This will unlikely to interfere with his upcoming cath, however I do agree this is something that we need to figure out prior to any possible surgery. I did call [facility] lab today, the representative was planning to call me back after checking with their lab to see what else we can do to assess the platelet level, I did check about 2 hours later, the representative was still waiting on their lab personal to give her advise, however by the end of day, and I still did not receive any phone call. I will forward this note to his PCP. I plan to call the [facility] main laboratory on Monday morning to discuss other options to assess platelet level, if there is no other way of assessing that, I plan to call the on-call hematologist to further discuss this case to see if he needs a hematology consult prior to any surgical workup. Otherwise his white blood cell count and red blood cell count appears to be normal.  3. CVA: As part of workup for stroke, incidentally found to have a mass on the aortic valve.  4. DVT after leg injury in July 2017, this has been treated with Xarelto    Medication Adjustments/Labs and Tests Ordered: Current medicines are reviewed at length with the patient today.  Concerns regarding medicines are outlined above.  Medication changes, Labs and Tests ordered today are listed in the Patient Instructions below. Patient Instructions  Your physician has requested that you have a cardiac catheterization. Cardiac catheterization is used to diagnose and/or treat various heart conditions.  Doctors may recommend this procedure for a number of different reasons. The most common reason is to evaluate chest pain. Chest pain can be a symptom of coronary artery disease (CAD), and cardiac catheterization can show whether plaque is narrowing or blocking your heart's arteries. This procedure is also used to evaluate the valves, as well as measure the blood flow and oxygen levels in different parts of your heart. For further information please visit [email or web address]   Following your catheterization, you will not be allowed to drive for 3 days.  No lifting, pushing, or  pulling greater that 10 pounds is allowed for 1 week.  You will be required to have the following tests prior to the procedure:  1. Blood work-the blood work can be done no more than 7 days prior to the procedure.  It can be done at any [facility] lab.  There is one downstairs on the first floor of this building and one in the [facility] building [phone]).  2. Chest Xray-the chest xray order has already been placed at the [facility] Building.      HOLD YOUR RIVAROXABAN 24 HOURS PRIOR TO THE CATH.  ( The condition that you want to look up is " FIBROELASTOMA" )        Signed, Azalee Course, PA  10/23/2015 11:53 PM    [facility] [address] [place], [place], [place]  [address] Phone: [phone]; Fax: [phone]

## 2015-10-26 ENCOUNTER — Other Ambulatory Visit: Payer: Self-pay | Admitting: Physician Assistant

## 2015-10-26 ENCOUNTER — Ambulatory Visit
Admission: RE | Admit: 2015-10-26 | Discharge: 2015-10-26 | Disposition: A | Discharge status: Home / Self Care | Payer: BLUE CROSS/BLUE SHIELD | Source: Ambulatory Visit | Attending: Physician Assistant | Admitting: Physician Assistant

## 2015-10-26 DIAGNOSIS — I358 Other nonrheumatic aortic valve disorders: Secondary | ICD-10-CM

## 2015-10-26 DIAGNOSIS — Z01811 Encounter for preprocedural respiratory examination: Secondary | ICD-10-CM

## 2015-10-26 DIAGNOSIS — D691 Qualitative platelet defects: Secondary | ICD-10-CM

## 2015-10-26 LAB — CBC
HEMATOCRIT: 40 % (ref 38.5–50.0)
HEMOGLOBIN: 14.1 g/dL (ref 13.2–17.1)
MCH: 29.7 pg (ref 27.0–33.0)
MCHC: 35.3 g/dL (ref 32.0–36.0)
MCV: 84.4 fL (ref 80.0–100.0)
RBC: 4.74 MIL/uL (ref 4.20–5.80)
RDW: 13.6 % (ref 11.0–15.0)
WBC: 4.3 10*3/uL (ref 3.8–10.8)

## 2015-10-26 NOTE — H&P (Signed)
    Primary Physician: Primary Cardiologist:  Herbie BaltimoreHarding    HPI: Pt rpresents for TEE Hix of DVT then CVA (sept 5)  Pt on Xarelto when had CVA    Pt hospitalized   Discharged home  No new neuro complaints       Past Medical History:  Diagnosis Date  . 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       Infusions:   Allergies  Allergen Reactions  . Naproxen Anaphylaxis    Social History   Social History  . Marital status: Married    Spouse name: N/A  . Number of children: N/A  . Years of education: N/A   Occupational History  . Not on file.   Social History Main Topics  . Smoking status: Never Smoker  . Smokeless tobacco: Never Used  . Alcohol use Not on file     Comment: rarely  . Drug use: Unknown  . Sexual activity: Not on file   Other Topics Concern  . Not on file   Social History Narrative  . No narrative on file    Family History  Problem Relation Age of Onset  . Heart disease Father   . Hypertension Father   . Heart disease Maternal Grandfather   . Hypertension Maternal Grandfather   . Diabetes Paternal Grandfather   . Heart disease Paternal Grandfather     REVIEW OF SYSTEMS:  All systems reviewed  Negative to the above problem except as noted above.    PHYSICAL EXAM: Vitals:   10/22/15 0855 10/22/15 0905  BP: 116/75 123/77  Pulse: (!) 59 62  Resp: 17 17  Temp:      No intake or output data in the 24 hours ending 10/26/15 2252  General:  Well appearing. No respiratory difficulty HEENT: normal Neck: supple. no JVD. Carotids 2+ bilat; no bruits. No lymphadenopathy or thryomegaly appreciated. Cor: PMI nondisplaced. Regular rate & rhythm. No rubs, gallops or murmurs. Lungs: clear Abdomen: soft, nontender, nondistended. No  hepatosplenomegaly. No bruits or masses. Good bowel sounds. Extremities: no cyanosis, clubbing, rash, edema Neuro  Deferred .  ECG:  No EKG  On tele:  SR    No results found for this or any previous visit (from the past 24 hour(s)). Dg Chest 2 View  Result Date: 10/26/2015 CLINICAL DATA:  Preop heart catheterization. EXAM: CHEST  2 VIEW COMPARISON:  None. FINDINGS: The heart size and mediastinal contours are within normal limits. Both lungs are clear. The visualized skeletal structures are unremarkable. IMPRESSION: No active cardiopulmonary disease. Electronically Signed   By: Elige KoHetal  Patel   On: 10/26/2015 12:24     ASSESSMENT:   PT is a 55 yo with recent CVA  Presetns for TEE  Procedure explained  Risks/benefits  Pt understands and agrees to proceed

## 2015-10-27 ENCOUNTER — Other Ambulatory Visit (HOSPITAL_COMMUNITY)
Admission: RE | Admit: 2015-10-27 | Discharge: 2015-10-27 | Disposition: A | Discharge status: Home / Self Care | Payer: BLUE CROSS/BLUE SHIELD | Source: Ambulatory Visit | Attending: Cardiology | Admitting: Cardiology

## 2015-10-27 ENCOUNTER — Telehealth: Payer: Self-pay | Admitting: Physician Assistant

## 2015-10-27 ENCOUNTER — Telehealth: Payer: Self-pay | Admitting: Pharmacist

## 2015-10-27 ENCOUNTER — Other Ambulatory Visit: Payer: Self-pay | Admitting: *Deleted

## 2015-10-27 ENCOUNTER — Other Ambulatory Visit: Payer: Self-pay | Admitting: Physician Assistant

## 2015-10-27 DIAGNOSIS — D691 Qualitative platelet defects: Secondary | ICD-10-CM | POA: Insufficient documentation

## 2015-10-27 LAB — BASIC METABOLIC PANEL
BUN: 18 mg/dL (ref 7–25)
CALCIUM: 9.8 mg/dL (ref 8.6–10.3)
CO2: 26 mmol/L (ref 20–31)
Chloride: 103 mmol/L (ref 98–110)
Creat: 1.27 mg/dL (ref 0.70–1.33)
GLUCOSE: 99 mg/dL (ref 65–99)
Potassium: 4.2 mmol/L (ref 3.5–5.3)
SODIUM: 136 mmol/L (ref 135–146)

## 2015-10-27 LAB — APTT: aPTT: 90 s (ref 22–34)

## 2015-10-27 LAB — PROTIME-INR
INR: 1.4 — ABNORMAL HIGH
Prothrombin Time: 14.4 s — ABNORMAL HIGH (ref 9.0–11.5)

## 2015-10-27 LAB — PLATELET COUNT: PLATELETS: 65 10*3/uL — AB (ref 150–400)

## 2015-10-27 NOTE — Telephone Encounter (Signed)
[facility] calling to report critical value -   aPTT - 90  INR -1.4  Was verified on repeat analysis. Results being faxed.   Will route to [facility], [place] as it appears he has already addressed this result and he was the ordering provider.

## 2015-10-27 NOTE — Telephone Encounter (Signed)
Patient had multiple CBC with clumped platelet resulting in unknown platelet level. I discussed with our [facility] which is part of [facility] about this case on 9/25. It was felt a group of people will have problem with clumping when their blood is collected via the traditional EDTA tube (lavender covered top). Even so, CBC obtained on 9/25 says "platelet clumps noted on smear - count appears adequate". After discussing with lab personal, it was recommended to collect samples in citrated tube which prevent clumping. (also note [facility] lab could not run citrated sample) Lab today shows platelet of 65.  Call the patient to report result. Also discussed with on-call hematologist Dr. Truett PernaSherrill and on-call pathologist Dr. Laureen OchsSmir. Both expressed the fact that platelet clumping alone could be normal variant and does not necessarily mean coagulation disorder, however that does not explain why the platelet is 65. Although there are some cases suggest platelet level obtained form citrated blood could be lower than normal, however per both hematologist and pathologist, from their experience, it should be pretty close if not the same as the true level, therefore a platelet of 65 is still lower than expected. No clumped were documented in this citrated blood work to explain the low number and result was reviewed via smear.   Per both hematology and pathology, repeat platelet level is recommended before surgery. Hematology recommended obtain 3 tubes of blood (CBC, platelet count in citrated blood and platelet count in heparin tube so they can do comparison). Pathologist recommended 2 tubes of blood (CBC and platelet count in citrated blood) and felt citrated tube is enough to replace the heparin tube.  Since overall platelet count is > 50, it is not prohibitive of upcoming diagnostic cath, however this lab should be repeated on the day of cath as this maybe important for upcoming cardiothoracic surgery. Also his  APTT yesterday was 90, his last dose of Xarelto was this morning. He is holding further dose for cath.  Our plan is obtain stat CBC (including platelet in EDTA tube), obtain citrated platelet in blue top tube, and also obtain platelet in heparin tube (if doable here) in the morning of cath. Also obtain APTT as well. If platelet still low, consider have hematology see him here before he leave the hospital in order to facilitate the workup for CT surgery.  Ramond DialSigned, Shayona Hibbitts PA Pager: [phone]

## 2015-10-27 NOTE — Telephone Encounter (Signed)
He is on your schedule for cath this Thursday. Patient was on Xarelto when he took his lab, I think clinical study of Xarelto shows it has nonlinear dose relationship with APTT, do you want him to have a repeat on the day of cath or do you think it will not interfere with the lab result?

## 2015-10-27 NOTE — Telephone Encounter (Addendum)
Discussed with Dr. Clifton JamesMcAlhany, no need to repeat APTT, will cancel APTT lab.

## 2015-10-27 NOTE — Telephone Encounter (Signed)
We do not need to repeat the PTT before his cath. Thanks, chris

## 2015-10-28 ENCOUNTER — Telehealth: Payer: Self-pay | Admitting: Cardiology

## 2015-10-28 NOTE — Telephone Encounter (Signed)
Plan is to recheck platelets tomorrow.  This is not an issue for having cath. If level is still "low", plan is to have Heme-Onc see potentially while in Short Stay.  Bryan Lemmaavid Harding, MD

## 2015-10-28 NOTE — Telephone Encounter (Signed)
Spoke to patient.  PLATELET COUNT Result given . Verbalized understanding  Patient pose the question of what is the surgeon cut off point for surgery in regards to platelet count.  Patient concerned about having cath to soon if platelet count is not improved, should hematology evaluate before cath.  Patient has an appointment with Dr Barry Dieneswens on 11/03/15 Tuesday 4pm.   RN informed patient that Dr Herbie BaltimoreHarding may have to dscuss with Dr Barry Dieneswens.  RN inform a patient will defer to Dr Herbie BaltimoreHarding and contact

## 2015-10-28 NOTE — Telephone Encounter (Signed)
Please call,pt is scheduled for Cath tomorrow. He needs to ask some questions about this asap.

## 2015-10-28 NOTE — Telephone Encounter (Signed)
No answer, left message on answer machine to call back

## 2015-10-29 ENCOUNTER — Telehealth: Payer: Self-pay | Admitting: Physician Assistant

## 2015-10-29 ENCOUNTER — Encounter (HOSPITAL_COMMUNITY): Payer: Self-pay | Admitting: *Deleted

## 2015-10-29 ENCOUNTER — Encounter (HOSPITAL_COMMUNITY)
Admission: RE | Disposition: A | Discharge status: Home / Self Care | Payer: Self-pay | Source: Ambulatory Visit | Attending: Cardiovascular Disease

## 2015-10-29 ENCOUNTER — Ambulatory Visit (HOSPITAL_COMMUNITY)
Admission: RE | Admit: 2015-10-29 | Discharge: 2015-10-29 | Disposition: A | Discharge status: Home / Self Care | Payer: BLUE CROSS/BLUE SHIELD | Source: Ambulatory Visit | Attending: Cardiovascular Disease | Admitting: Cardiovascular Disease

## 2015-10-29 DIAGNOSIS — Z8249 Family history of ischemic heart disease and other diseases of the circulatory system: Secondary | ICD-10-CM | POA: Insufficient documentation

## 2015-10-29 DIAGNOSIS — Z86718 Personal history of other venous thrombosis and embolism: Secondary | ICD-10-CM | POA: Insufficient documentation

## 2015-10-29 DIAGNOSIS — D696 Thrombocytopenia, unspecified: Secondary | ICD-10-CM

## 2015-10-29 DIAGNOSIS — Z01818 Encounter for other preprocedural examination: Secondary | ICD-10-CM

## 2015-10-29 DIAGNOSIS — Z833 Family history of diabetes mellitus: Secondary | ICD-10-CM | POA: Diagnosis not present

## 2015-10-29 DIAGNOSIS — D689 Coagulation defect, unspecified: Secondary | ICD-10-CM | POA: Insufficient documentation

## 2015-10-29 DIAGNOSIS — Z8673 Personal history of transient ischemic attack (TIA), and cerebral infarction without residual deficits: Secondary | ICD-10-CM | POA: Diagnosis not present

## 2015-10-29 DIAGNOSIS — I359 Nonrheumatic aortic valve disorder, unspecified: Secondary | ICD-10-CM | POA: Insufficient documentation

## 2015-10-29 DIAGNOSIS — Z7901 Long term (current) use of anticoagulants: Secondary | ICD-10-CM | POA: Insufficient documentation

## 2015-10-29 HISTORY — DX: Acute embolism and thrombosis of unspecified deep veins of unspecified lower extremity: I82.409

## 2015-10-29 HISTORY — PX: CARDIAC CATHETERIZATION: SHX172

## 2015-10-29 LAB — CBC
HCT: 40.2 % (ref 39.0–52.0)
Hemoglobin: 14.3 g/dL (ref 13.0–17.0)
MCH: 30 pg (ref 26.0–34.0)
MCHC: 35.6 g/dL (ref 30.0–36.0)
MCV: 84.5 fL (ref 78.0–100.0)
PLATELETS: 66 10*3/uL — AB (ref 150–400)
RBC: 4.76 MIL/uL (ref 4.22–5.81)
RDW: 12.8 % (ref 11.5–15.5)
WBC: 5.2 10*3/uL (ref 4.0–10.5)

## 2015-10-29 LAB — APTT: aPTT: 74 seconds — ABNORMAL HIGH (ref 24–36)

## 2015-10-29 SURGERY — CORONARY/GRAFT ANGIOGRAPHY

## 2015-10-29 MED ORDER — HEPARIN SODIUM (PORCINE) 1000 UNIT/ML IJ SOLN
INTRAMUSCULAR | Status: DC | PRN
  Administered 2015-10-29: 4500 [IU] via INTRAVENOUS

## 2015-10-29 MED ORDER — SODIUM CHLORIDE 0.9% FLUSH: 3.0000 mL | INTRAVENOUS | Status: DC | PRN

## 2015-10-29 MED ORDER — HEPARIN (PORCINE) IN NACL 2-0.9 UNIT/ML-% IJ SOLN
INTRAMUSCULAR | Status: DC | PRN
  Administered 2015-10-29: 1000 mL

## 2015-10-29 MED ORDER — ASPIRIN 81 MG PO CHEW
CHEWABLE_TABLET | ORAL | Status: AC
  Filled 2015-10-29: qty 1

## 2015-10-29 MED ORDER — SODIUM CHLORIDE 0.9 % IV SOLN
INTRAVENOUS | Status: DC
  Administered 2015-10-29: 09:00:00 via INTRAVENOUS

## 2015-10-29 MED ORDER — SODIUM CHLORIDE 0.9 % IV SOLN: 250.0000 mL | INTRAVENOUS | Status: DC | PRN

## 2015-10-29 MED ORDER — SODIUM CHLORIDE 0.9 % IV SOLN: INTRAVENOUS | Status: AC

## 2015-10-29 MED ORDER — VERAPAMIL HCL 2.5 MG/ML IV SOLN
INTRAVENOUS | Status: AC
  Filled 2015-10-29: qty 2

## 2015-10-29 MED ORDER — FENTANYL CITRATE (PF) 100 MCG/2ML IJ SOLN
INTRAMUSCULAR | Status: AC
  Filled 2015-10-29: qty 2

## 2015-10-29 MED ORDER — MIDAZOLAM HCL 2 MG/2ML IJ SOLN
INTRAMUSCULAR | Status: AC
  Filled 2015-10-29: qty 2

## 2015-10-29 MED ORDER — SODIUM CHLORIDE 0.9% FLUSH: 3.0000 mL | Freq: Two times a day (BID) | INTRAVENOUS | Status: DC

## 2015-10-29 MED ORDER — IOPAMIDOL (ISOVUE-370) INJECTION 76%
INTRAVENOUS | Status: DC | PRN
  Administered 2015-10-29: 40 mL via INTRA_ARTERIAL

## 2015-10-29 MED ORDER — HEPARIN SODIUM (PORCINE) 1000 UNIT/ML IJ SOLN
INTRAMUSCULAR | Status: AC
  Filled 2015-10-29: qty 1

## 2015-10-29 MED ORDER — IOPAMIDOL (ISOVUE-370) INJECTION 76%
INTRAVENOUS | Status: AC
  Filled 2015-10-29: qty 100

## 2015-10-29 MED ORDER — VERAPAMIL HCL 2.5 MG/ML IV SOLN
INTRAVENOUS | Status: DC | PRN
  Administered 2015-10-29: 10 mL via INTRA_ARTERIAL

## 2015-10-29 MED ORDER — HEPARIN (PORCINE) IN NACL 2-0.9 UNIT/ML-% IJ SOLN
INTRAMUSCULAR | Status: AC
  Filled 2015-10-29: qty 1000

## 2015-10-29 MED ORDER — LIDOCAINE HCL (PF) 1 % IJ SOLN
INTRAMUSCULAR | Status: AC
  Filled 2015-10-29: qty 30

## 2015-10-29 MED ORDER — HEPARIN (PORCINE) IN NACL 2-0.9 UNIT/ML-% IJ SOLN
INTRAMUSCULAR | Status: AC
  Filled 2015-10-29: qty 500

## 2015-10-29 MED ORDER — MIDAZOLAM HCL 2 MG/2ML IJ SOLN
INTRAMUSCULAR | Status: DC | PRN
  Administered 2015-10-29: 1 mg via INTRAVENOUS
  Administered 2015-10-29: 2 mg via INTRAVENOUS

## 2015-10-29 MED ORDER — LIDOCAINE HCL (PF) 1 % IJ SOLN
INTRAMUSCULAR | Status: DC | PRN
  Administered 2015-10-29: 3 mL

## 2015-10-29 MED ORDER — FENTANYL CITRATE (PF) 100 MCG/2ML IJ SOLN
INTRAMUSCULAR | Status: DC | PRN
  Administered 2015-10-29: 25 ug via INTRAVENOUS
  Administered 2015-10-29: 50 ug via INTRAVENOUS

## 2015-10-29 SURGICAL SUPPLY — 9 items
CATH IMPULSE 5F ANG/FL3.5 (CATHETERS) ×2 IMPLANT
DEVICE RAD COMP TR BAND LRG (VASCULAR PRODUCTS) ×2 IMPLANT
GLIDESHEATH SLEND SS 6F .021 (SHEATH) ×2 IMPLANT
KIT HEART LEFT (KITS) ×2 IMPLANT
PACK CARDIAC CATHETERIZATION (CUSTOM PROCEDURE TRAY) ×2 IMPLANT
TRANSDUCER W/STOPCOCK (MISCELLANEOUS) ×2 IMPLANT
TUBING CIL FLEX 10 FLL-RA (TUBING) ×2 IMPLANT
WIRE HI TORQ VERSACORE-J 145CM (WIRE) ×2 IMPLANT
WIRE SAFE-T 1.5MM-J .035X260CM (WIRE) ×2 IMPLANT

## 2015-10-29 NOTE — Interval H&P Note (Signed)
History and Physical Interval Note:  10/29/2015 9:21 AM  Brian Lloyd  has presented today for cardiac cath with the diagnosis of aortic valve mass, pre-op.The various methods of treatment have been discussed with the patient and family. After consideration of risks, benefits and other options for treatment, the patient has consented to  Procedure(s): Left Heart Cath and Coronary Angiography (N/A) as a surgical intervention .  The patient's history has been reviewed, patient examined, no change in status, stable for surgery.  I have reviewed the patient's chart and labs.  Questions were answered to the patient's satisfaction.    Cath Lab Visit (complete for each Cath Lab visit)  Clinical Evaluation Leading to the Procedure:   ACS: No.  Non-ACS:    Anginal Classification: No Symptoms  Anti-ischemic medical therapy: No Therapy  Non-Invasive Test Results: No non-invasive testing performed  Prior CABG: No previous CABG         Verne Carrowhristopher Kamarie Palma

## 2015-10-29 NOTE — Discharge Instructions (Signed)
Radial Site Care °Refer to this sheet in the next few weeks. These instructions provide you with information about caring for yourself after your procedure. Your health care provider may also give you more specific instructions. Your treatment has been planned according to current medical practices, but problems sometimes occur. Call your health care provider if you have any problems or questions after your procedure. °WHAT TO EXPECT AFTER THE PROCEDURE °After your procedure, it is typical to have the following: °· Bruising at the radial site that usually fades within 1-2 weeks. °· Blood collecting in the tissue (hematoma) that may be painful to the touch. It should usually decrease in size and tenderness within 1-2 weeks. °HOME CARE INSTRUCTIONS °· Take medicines only as directed by your health care provider. °· You may shower 24-48 hours after the procedure or as directed by your health care provider. Remove the bandage (dressing) and gently wash the site with plain soap and water. Pat the area dry with a clean towel. Do not rub the site, because this may cause bleeding. °· Do not take baths, swim, or use a hot tub until your health care provider approves. °· Check your insertion site every day for redness, swelling, or drainage. °· Do not apply powder or lotion to the site. °· Do not flex or bend the affected arm for 24 hours or as directed by your health care provider. °· Do not push or pull heavy objects with the affected arm for 24 hours or as directed by your health care provider. °· Do not lift over 10 lb (4.5 kg) for 5 days after your procedure or as directed by your health care provider. °· Ask your health care provider when it is okay to: °¨ Return to work or school. °¨ Resume usual physical activities or sports. °¨ Resume sexual activity. °· Do not drive home if you are discharged the same day as the procedure. Have someone else drive you. °· You may drive 24 hours after the procedure unless otherwise  instructed by your health care provider. °· Do not operate machinery or power tools for 24 hours after the procedure. °· If your procedure was done as an outpatient procedure, which means that you went home the same day as your procedure, a responsible adult should be with you for the first 24 hours after you arrive home. °· Keep all follow-up visits as directed by your health care provider. This is important. °SEEK MEDICAL CARE IF: °· You have a fever. °· You have chills. °· You have increased bleeding from the radial site. Hold pressure on the site. °SEEK IMMEDIATE MEDICAL CARE IF: °· You have unusual pain at the radial site. °· You have redness, warmth, or swelling at the radial site. °· You have drainage (other than a small amount of blood on the dressing) from the radial site. °· The radial site is bleeding, and the bleeding does not stop after 30 minutes of holding steady pressure on the site. °· Your arm or hand becomes pale, cool, tingly, or numb. °  °This information is not intended to replace advice given to you by your health care provider. Make sure you discuss any questions you have with your health care provider. °  °Document Released: 02/19/2010 Document Revised: 02/07/2014 Document Reviewed: 08/05/2013 °Elsevier Interactive Patient Education ©2016 [employer] ° °

## 2015-10-29 NOTE — Progress Notes (Signed)
Pt does not need aspirin pre cath per Victorino DikeJennifer, RN.

## 2015-10-29 NOTE — H&P (View-Only) (Signed)
Cardiology Office Note    Date:  10/23/2015   ID:  Brian Lloyd, Brian Lloyd [birth year], MRN [id number]  PCP:  Brian Lloyd  Cardiologist:  Dr. Herbie Lloyd  Chief Complaint  Patient presents with  . Follow-up    seen for Dr. Herbie Lloyd    History of Present Illness:  Brian Lloyd is a 55 y.o. male with PMH of DVT and CVA. He has also been treated by Dr. Margaretha Lloyd with sports medicine for left calf pain after injury which resulted in one tendon, complicated by DVT in July 2017. Patient was seen by Dr. Herbie Lloyd on 10/19/2015 for evaluation after echocardiogram performed for stroke evaluation showed aortic valve lesion. 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 MRI obtained on the same day showed small acute to subacute lacunar infarct in the left cerebellum. No mass effect or hemorrhage. 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 Carotid Doppler obtained on 9/80/2017 showed homogeneous plaque in the right carotid with mild intimal thickening on the left, less than 40% bilaterally.   He underwent outpatient transesophageal echocardiogram on 10/22/2015 showed large mobile mass along the ventricular surface of AV measuring 2 x 1 cm, service was very irregular, appears to be attached to known coronary cusp near the base. Suspicious of fibroelastoma. No evidence of thrombosis.  I have discussed with Dr. Herbie Lloyd and the patient and the her wife regarding benefit and risk of the cardiac catheterization. They are willing to proceed, however they have very concerned that this he has had at least 3 labs so far and his platelet level is still unknown at this point as all the blood draws have platelet clumping.    Past Medical History:  Diagnosis Date  . Cerebellar infarct (HCC) 10/06/2015   Cardiac MRI showed  acute to subacute lacunar infarct in the left cerebellum  . 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 HERNIA REPAIR Bilateral   . MOUTH SURGERY     root canal  . TEE WITHOUT CARDIOVERSION N/A 10/22/2015   Procedure: TRANSESOPHAGEAL ECHOCARDIOGRAM (TEE);  Surgeon: Pricilla Riffle, MD;  Location: [facility] ENDOSCOPY;  Service: Cardiovascular;  Laterality: N/A;  . TRANSTHORACIC ECHOCARDIOGRAM  10/09/2015   Mild LVH. EF 60-65%. Pseudo-normal grade 2 diastolic dysfunction. 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 TEE recommended.    Current Medications: Outpatient Medications Prior to Visit  Medication Sig Dispense Refill  . Rivaroxaban 15 & 20 MG TBPK Take as directed on package: Start with one 15mg  tablet by mouth twice a day with food. On Day 22, switch to one 20mg  tablet daily with food. 51 each 0   No facility-administered medications prior to visit.      Allergies:   Naproxen   Social History   Social History  . Marital status: Married    Spouse name: N/A  . Number of children: N/A  . Years of education: N/A   Social History Main Topics  . Smoking status: Never Smoker  . Smokeless tobacco: Never Used  . Alcohol use None     Comment: rarely  . Drug use: Unknown  . Sexual activity: Not Asked   Other Topics Concern  . None   Social History Narrative  .  None     Family History:  The patient's family history includes Diabetes in his paternal grandfather; Heart disease in his father, maternal grandfather, and paternal grandfather; Hypertension in his father and maternal grandfather.   ROS:   Please see the history of present illness.    ROS All other systems reviewed and are negative.   PHYSICAL EXAM:   VS:  BP 126/76   Pulse 63   Ht 5\' 8"  (1.727 m)   Wt 190 lb 12.8 oz (86.5 kg)   BMI 29.01 kg/m    GEN: Well nourished, well  developed, in no acute distress  HEENT: normal  Neck: no JVD, carotid bruits, or masses Cardiac: RRR; no murmurs, rubs, or gallops,no edema  Respiratory:  clear to auscultation bilaterally, normal work of breathing GI: soft, nontender, nondistended, + BS MS: no deformity or atrophy  Skin: warm and dry, no rash Neuro:  Alert and Oriented x 3, Strength and sensation are intact Psych: euthymic mood, full affect  Wt Readings from Last 3 Encounters:  10/23/15 190 lb 12.8 oz (86.5 kg)  10/19/15 191 lb 3.2 oz (86.7 kg)  08/10/15 182 lb (82.6 kg)      Studies/Labs Reviewed:   EKG:  EKG is not ordered today.    Recent Labs: 10/19/2015: BUN 21; Creat 1.11; Hemoglobin 14.2; Platelets SEE NOTE; Potassium 4.6; Sodium 137   Lipid Panel No results found for: CHOL, TRIG, HDL, CHOLHDL, VLDL, LDLCALC, LDLDIRECT  Additional studies/ records that were reviewed today include:   Echo 10/09/2015 LV EF: 60% -   65%  ------------------------------------------------------------------- Indications:      Z86.73 History of CVA.  BUBBLE STUDY.  ------------------------------------------------------------------- History:   PMH:  Acquired from the patient and from the patient&'s chart.  ------------------------------------------------------------------- Study Conclusions  - Left ventricle: The cavity size was normal. Wall thickness was   increased in a pattern of mild LVH. Systolic function was normal.   The estimated ejection fraction was in the range of 60% to 65%.   Wall motion was normal; there were no regional wall motion   abnormalities. Features are consistent with a pseudonormal left   ventricular filling pattern, with concomitant abnormal relaxation   and increased filling pressure (grade 2 diastolic dysfunction). - Aortic valve: There was a medium-sized, 1.4 cm (L) x 0.6 cm (W)   vegetation on the left ventricular aspect. - Atrial septum: No defect or patent foramen ovale was  identified.  Impressions:  - There is a medium sized mass on the aortic valve that is c/w a   vegetation.   Consider TEE for further evaluation if clinically indicated.   TEE 10/22/2015 - Aortic valve: Large mobile mass along ventricular surface of AV   Measures 2 x 1 cm. Surface is very irregular (frondlike) Appears   to be attached to noncoronary cusp, near base. Base of this cusp   is thickened. Suspicious for fibroelastoma. Mild AI. - Left atrium: No evidence of thrombus in the atrial cavity or   appendage.    ASSESSMENT:    1. Aortic valve mass   2. Pre-op testing   3. Blood clotting disorder (HCC)   4. Pre-op chest exam   5. DVT (deep venous thrombosis), left   6. 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 Aortic valve mass: Found incidentally as part of workup for recent stroke on TTE, TEE performed yesterday was concerning  for fibroelastoma. Apparently this case has been discussed with Dr. Cornelius Moras in the hospital. The plan is for the patient to undergo preoperative cardiac catheterization for assessment of coronary anatomy prior to possible surgery.  - Risk and benefit of procedure explained to the patient who display clear understanding and agree to proceed. Discussed with patient possible procedural risk include bleeding, vascular injury, renal injury, arrythmia, MI, stroke and loss of limb or life.  - We have tentatively arranged for cardiac catheterization while waiting for further assessment of his platelet level. I have personally talked with Arlys John in our cath lab to make sure that the interventional cardiologist is aware that we should NOT cross the valve during cardiac catheterization as this will increase his chance of stroke.  - I have not placed his cath order yet, I plan to do so after I figure out what to do with his platelet on Monday.  - he will need to hold his Xarelto for 24  hour prior to cath and 48 hour prior to any surgery.  2. Possible clotting disorder: Per wife, he has had 3 recent CBC, all shows clumped platelets on the smear therefore unable to assess total platelet level. She is very concerned about this. Although hypercoagulable clotting disorder can potentially cause his recent DVT and stroke, the presence of aortic mass was still warrant surgical consultation. This will unlikely to interfere with his upcoming cath, however I do agree this is something that we need to figure out prior to any possible surgery. I did call [facility] today, the representative was planning to call me back after checking with their lab to see what else we can do to assess the platelet level, I did check about 2 hours later, the representative was still waiting on their lab personal to give her advise, however by the end of day, and I still did not receive any phone call. I will forward this note to his PCP. I plan to call the [facility] main laboratory on Monday morning to discuss other options to assess platelet level, if there is no other way of assessing that, I plan to call the on-call hematologist to further discuss this case to see if he needs a hematology consult prior to any surgical workup. Otherwise his white blood cell count and red blood cell count appears to be normal.  3. CVA: As part of workup for stroke, incidentally found to have a mass on the aortic valve.  4. DVT after leg injury in July 2017, this has been treated with Xarelto    Medication Adjustments/Labs and Tests Ordered: Current medicines are reviewed at length with the patient today.  Concerns regarding medicines are outlined above.  Medication changes, Labs and Tests ordered today are listed in the Patient Instructions below. Patient Instructions  Your physician has requested that you have a cardiac catheterization. Cardiac catheterization is used to diagnose and/or treat various heart conditions.  Doctors may recommend this procedure for a number of different reasons. The most common reason is to evaluate chest pain. Chest pain can be a symptom of coronary artery disease (CAD), and cardiac catheterization can show whether plaque is narrowing or blocking your heart's arteries. This procedure is also used to evaluate the valves, as well as measure the blood flow and oxygen levels in different parts of your heart. For further information please visit [email or web address]   Following your catheterization, you will not be allowed to drive for 3 days.  No lifting, pushing, or  pulling greater that 10 pounds is allowed for 1 week.  You will be required to have the following tests prior to the procedure:  1. Blood work-the blood work can be done no more than 7 days prior to the procedure.  It can be done at any [facility] lab.  There is one downstairs on the first floor of this building and one in the [facility] building [phone]).  2. Chest Xray-the chest xray order has already been placed at the [facility] Building.      HOLD YOUR RIVAROXABAN 24 HOURS PRIOR TO THE CATH.  ( The condition that you want to look up is " FIBROELASTOMA" )        Signed, Azalee Course, PA  10/23/2015 11:53 PM    [facility] [address] [place], [place], [place]  [address] Phone: [phone]; Fax: [phone]

## 2015-10-29 NOTE — Telephone Encounter (Signed)
Was asked by Dr. Herbie BaltimoreHarding to make sure pt has request for heme-onc consult in the system. Have sent message to patient care coordinator to make sure this is in the works. Have also placed referral order. Zamani Crocker PA-C

## 2015-10-29 NOTE — Telephone Encounter (Signed)
Late entry called @ 5:01 pm 10/28/15.Left message to call back

## 2015-10-30 ENCOUNTER — Encounter (HOSPITAL_COMMUNITY): Payer: Self-pay | Admitting: Cardiovascular Disease

## 2015-11-03 ENCOUNTER — Other Ambulatory Visit: Payer: Self-pay | Admitting: Oncology

## 2015-11-03 ENCOUNTER — Encounter: Payer: BLUE CROSS/BLUE SHIELD | Admitting: Thoracic Surgery (Cardiothoracic Vascular Surgery)

## 2015-11-03 ENCOUNTER — Encounter: Payer: Self-pay | Admitting: Thoracic Surgery (Cardiothoracic Vascular Surgery)

## 2015-11-03 ENCOUNTER — Institutional Professional Consult (permissible substitution) (INDEPENDENT_AMBULATORY_CARE_PROVIDER_SITE_OTHER): Payer: BLUE CROSS/BLUE SHIELD | Admitting: Thoracic Surgery (Cardiothoracic Vascular Surgery)

## 2015-11-03 VITALS — BP 131/81 | HR 61 | Resp 20 | Ht 68.0 in | Wt 190.0 lb

## 2015-11-03 DIAGNOSIS — D696 Thrombocytopenia, unspecified: Secondary | ICD-10-CM

## 2015-11-03 DIAGNOSIS — I359 Nonrheumatic aortic valve disorder, unspecified: Secondary | ICD-10-CM | POA: Diagnosis not present

## 2015-11-03 DIAGNOSIS — I825Z1 Chronic embolism and thrombosis of unspecified deep veins of right distal lower extremity: Secondary | ICD-10-CM

## 2015-11-03 DIAGNOSIS — Z8673 Personal history of transient ischemic attack (TIA), and cerebral infarction without residual deficits: Secondary | ICD-10-CM

## 2015-11-03 DIAGNOSIS — I358 Other nonrheumatic aortic valve disorders: Secondary | ICD-10-CM

## 2015-11-03 NOTE — Patient Instructions (Signed)
Continue all previous medications without any changes at this time  

## 2015-11-03 NOTE — Progress Notes (Signed)
[address]       [place] [address]             [phone]     CARDIOTHORACIC SURGERY CONSULTATION REPORT  Referring Provider is Pricilla Riffle, MD  Primary Cardiologist is Marykay Lex, MD PCP is Lovenia Kim, PA-C  Chief Complaint  Patient presents with  . 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  The patient states that he was in his usual state of health until June of this year when he injured his left calf muscle.  He was pushing a 250 pound generator up a hill and felt a sudden strain behind his left knee. He developed swelling and pain that persisted for more than 10 days.  He was evaluated by Dr. Margaretha Sheffield in the sports medicine clinic and diagnosed with muscular strain and possible tear of the plantaris. Swelling persisted and the patient underwent a lower extremity duplex scan that was positive for DVT.  He was anticoagulated using Xarelto beginning on 08/10/2015. Approximately 2 weeks later the patient experienced an episode of severe dizziness and double vision which lasted approximately 2 minutes and resolved.  He has had several brief recurrent episodes of double vision and mild dizziness over the last 2 months, but all episodes have been shorter duration and less in severity. He reported these symptoms to his primary care physician and ultimately underwent MRI of the brain on 10/06/2015 which revealed a small acute or subacute lacunar infarct in the left cerebellum with no associated mass effect or hemorrhage. A carotid duplex scan was performed and notable for the absence of significant extracranial carotid artery disease and antegrade blood flow  in the vertebral arteries. A transthoracic echocardiogram was performed demonstrating a "medium-sized, 1.4 cm" mass on the aortic valve felt suspicious for possible vegetation. There was no aortic insufficiency. There was normal left ventricular size and systolic function. There was no evidence of patent foramen ovale and no other abnormalities were noted. The patient was referred for cardiology consultation and evaluated by Dr. Herbie Baltimore.  2 sets of blood cultures were obtained and remained no growth. The patient underwent transesophageal echocardiogram on 10/22/2015. This revealed a mobile mass along the ventricular surface of aortic valve.  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 The aortic valve was tricuspid and there was trivial aortic insufficiency. No other significant abnormalities were noted. The patient underwent diagnostic cardiac catheterization on 10/29/2015. This was notable for the absence of significant coronary artery disease. Prior to catheterization routine blood work was abnormal with severe clumping of platelets. Complete blood count was repeated a total of 3 additional times, and platelet count was estimated 65,000 and 66,000 on the 2 samples where platelet count was estimated. The patient was referred for surgical consultation.  He has not been seen by a neurologist  The patient is married and lives with his wife in [place].  He has been retired for many years, previously working as an Acupuncturist. More recently he has been spending much of this time restoring automobiles. The patient states that up until this past June he remained entirely active physically. He exercises on  a regular basis and enjoys walking regularly. He reports no physical limitations prior to this summer. He continues to experience occasional very brief episodes of double vision dating back to the more significant episode of double vision and dizziness  which began in July. He states that symptoms are positional and he can oftentimes stop the double vision simply by changing the position of his head. He denies any fevers, chills, or night sweats. He reports normal appetite. He has a long history of arthritis and arthralgias. He states that despite the fact that he remains anticoagulated using Xarelto his blood clots very quickly if he scratches himself or cuts himself.  His injury to the left calf has healed and his walking is now essentially back to normal.  Past Medical History:  Diagnosis Date  . 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 DVT (deep venous thrombosis) (HCC)    left leg, on Xarelto  . 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 Thrombocytopenia (HCC)     Past Surgical History:  Procedure Laterality Date  . CARDIAC CATHETERIZATION N/A 10/29/2015   Procedure: Coronary/Graft Angiography;  Surgeon: Kathleene Hazel, MD;  Location: [facility] INVASIVE CV LAB;  Service: Cardiovascular;  Laterality: N/A;  . HERNIA REPAIR Bilateral   . MOUTH SURGERY     root canal  . TEE WITHOUT CARDIOVERSION N/A 10/22/2015   Procedure: TRANSESOPHAGEAL ECHOCARDIOGRAM (TEE);  Surgeon: Pricilla Riffle, MD;  Location: [facility] ENDOSCOPY;  Service: Cardiovascular;  Laterality: N/A;  . TRANSTHORACIC ECHOCARDIOGRAM  10/09/2015   Mild LVH. EF 60-65%. Pseudo-normal grade 2 diastolic dysfunction. 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 TEE recommended.    Family History  Problem Relation Age of Onset  . Heart disease Father   . Hypertension Father   . Heart disease Maternal Grandfather   . Hypertension Maternal Grandfather   . Diabetes Paternal Grandfather   . Heart disease Paternal Grandfather     Social History   Social History  . Marital status: Married     Spouse name: N/A  . Number of children: N/A  . Years of education: N/A   Occupational History  . Not on file.   Social History Main Topics  . Smoking status: Never Smoker  . Smokeless tobacco: Never Used  . Alcohol use Not on file     Comment: rarely  . Drug use: No  . Sexual activity: Not on file   Other Topics Concern  . Not on file   Social History Narrative  . No narrative on file    Current Outpatient Prescriptions  Medication Sig Dispense Refill  . Multiple Vitamin (MULTIVITAMIN) tablet Take 1 tablet by mouth daily.    . rivaroxaban (XARELTO) 20 MG TABS tablet Take 20 mg by mouth daily with supper.     No current facility-administered medications for this visit.     Allergies  Allergen Reactions  . Naproxen Anaphylaxis      Review of Systems:   General:  normal appetite, normal energy, no weight gain, no weight loss, no fever  Cardiac:  no chest pain with exertion, no chest pain at rest, noSOB with exertion, no resting SOB, no PND, no orthopnea, no palpitations, no arrhythmia, no atrial fibrillation, no LE edema, no dizzy spells, no syncope  Respiratory:  no shortness of  breath, no home oxygen, no productive cough, no dry cough, no bronchitis, no wheezing, no hemoptysis, no asthma, no pain with inspiration or cough, no sleep apnea, no CPAP at night  GI:   no difficulty swallowing, no reflux, no frequent heartburn, no hiatal hernia, no abdominal pain, no constipation, no diarrhea, no hematochezia, no hematemesis, no melena  GU:   no dysuria,  no frequency, no urinary tract infection, no hematuria, no enlarged prostate, no kidney stones, no kidney disease  Vascular:  no pain suggestive of claudication, no pain in feet, no leg cramps, no varicose veins, no DVT, no non-healing foot ulcer  Neuro:   + stroke, + TIA's, no seizures, no headaches, no temporary blindness one eye,  no slurred speech, no peripheral neuropathy, no chronic pain, no instability of gait, no  memory/cognitive dysfunction  Musculoskeletal: + arthritis, no joint swelling, no myalgias, no difficulty walking, normal mobility   Skin:   no rash, no itching, no skin infections, no pressure sores or ulcerations  Psych:   no anxiety, no depression, no nervousness, no unusual recent stress  Eyes:   + blurry vision, no floaters, + recent vision changes, does not wear glasses or contacts  ENT:   no hearing loss, no loose or painful teeth, no dentures  Hematologic:  no easy bruising, ? abnormal bleeding, ? clotting disorder, no frequent epistaxis  Endocrine:  no diabetes, does not check CBG's at home     Physical Exam:   BP 131/81   Pulse 61   Resp 20   Ht 5\' 8"  (1.727 m)   Wt 190 lb (86.2 kg)   SpO2 98% Comment: RA  BMI 28.89 kg/m   General:    well-appearing  HEENT:  Unremarkable   Neck:   no JVD, no bruits, no adenopathy   Chest:   clear to auscultation, symmetrical breath sounds, no wheezes, no rhonchi   CV:   RRR, no murmur   Abdomen:  soft, non-tender, no masses   Extremities:  warm, well-perfused, pulses palpable, no LE edema  Rectal/GU  Deferred  Neuro:   Grossly non-focal and symmetrical throughout  Skin:   Clean and dry, no rashes, no breakdown   Diagnostic Tests:  MRI HEAD WITHOUT AND WITH CONTRAST  TECHNIQUE: Multiplanar, multiecho pulse sequences of the brain and surrounding structures were obtained without and with intravenous contrast.  CONTRAST:  17mL MULTIHANCE GADOBENATE DIMEGLUMINE 529 MG/ML IV SOLN  COMPARISON:  None.  FINDINGS: There is a small 5-6 mm focus of mildly restricted diffusion in the left superior cerebellum (series 100, image 16). Mild associated T2 and FLAIR hyperintensity.  Major intracranial vascular flow voids are preserved. No other diffusion abnormality.  Elsewhere normal for age gray and white matter signal throughout the brain. No chronic cortical encephalomalacia or chronic cerebral blood products. No abnormal  enhancement identified. No dural thickening. Normal cavernous sinus. Bilateral orbits soft tissues appear normal.  No midline shift, mass effect, evidence of mass lesion, ventriculomegaly, extra-axial collection or acute intracranial hemorrhage. Cervicomedullary junction and pituitary are within normal limits. Negative visualized cervical spine. Visible internal auditory structures appear normal. Mastoids are clear. Mild to moderate ethmoid and sphenoid sinus mucosal thickening. Negative scalp soft tissues. Visualized bone marrow signal is within normal limits.  IMPRESSION: 1. There is a small acute to subacute lacunar infarct in the left cerebellum. No associated mass effect or hemorrhage. 2. Otherwise the MRI appearance of the brain is normal for age.  Electronically Signed: By: Odessa Fleming  M.D. On: 10/06/2015 11:32   Transthoracic Echocardiography  Patient:    Ryne, Mctigue MR #:       [id number] Study Date: 10/09/2015 Gender:     M Age:        54 Height:     172.7 cm Weight:     82.6 kg BSA:        2.01 m^2 Pt. Status: Room:   ATTENDING    Rollene Rotunda, MD  SONOGRAPHER  Aida Raider, RDCS  ORDERING     Lovenia Kim [address], [place] [id number]  PERFORMING   [facility]  cc:  ------------------------------------------------------------------- LV EF: 60% -   65%  ------------------------------------------------------------------- Indications:      Z86.73 History of CVA.  BUBBLE STUDY.  ------------------------------------------------------------------- History:   PMH:  Acquired from the patient and from the patient&'s chart.  ------------------------------------------------------------------- Study Conclusions  - Left ventricle: The cavity size was normal. Wall thickness was   increased in a pattern of mild LVH. Systolic function was normal.   The estimated ejection fraction was in the range of 60% to 65%.   Wall  motion was normal; there were no regional wall motion   abnormalities. Features are consistent with a pseudonormal left   ventricular filling pattern, with concomitant abnormal relaxation   and increased filling pressure (grade 2 diastolic dysfunction). - Aortic valve: There was a medium-sized, 1.4 cm (L) x 0.6 cm (W)   vegetation on the left ventricular aspect. - Atrial septum: No defect or patent foramen ovale was identified.  Impressions:  - There is a medium sized mass on the aortic valve that is c/w a   vegetation.   Consider TEE for further evaluation if clinically indicated.  ------------------------------------------------------------------- Study data:   Study status:  Routine.  Procedure:  The patient reported no pain pre or post test. Transthoracic echocardiography for left ventricular function evaluation, for right ventricular function evaluation, and for assessment of valvular function. Image quality was adequate. Intravenous contrast (agitated saline) was administered to identify shunting.  Study completion:  There were no complications.          Transthoracic echocardiography.  M-mode, complete 2D, spectral Doppler, and color Doppler.  Birthdate: Patient birthdate: 12/18/1960.  Age:  Patient is 55 yr old.  Sex: Gender: male.    BMI: 27.7 kg/m^2.  Blood pressure:     124/78 Patient status:  Outpatient.  Study date:  Study date: 10/09/2015. Study time: 09:29 AM.  Location:  [facility]  -------------------------------------------------------------------  ------------------------------------------------------------------- Left ventricle:  The cavity size was normal. Wall thickness was increased in a pattern of mild LVH. Systolic function was normal. The estimated ejection fraction was in the range of 60% to 65%. Wall motion was normal; there were no regional wall motion abnormalities. Features are consistent with a pseudonormal left ventricular filling  pattern, with concomitant abnormal relaxation and increased filling pressure (grade 2 diastolic dysfunction).  ------------------------------------------------------------------- Aortic valve:   Mildly thickened leaflets. There was a medium-sized, 1.4 cm (L) x 0.6 cm (W) vegetation on the left ventricular aspect.  Doppler:   There was no stenosis.   There was no regurgitation.  ------------------------------------------------------------------- Aorta:  The aorta was normal, not dilated, and non-diseased. Aortic root: The aortic root was normal in size. Ascending aorta: The ascending aorta was normal in size.  ------------------------------------------------------------------- Mitral valve:   Mildly thickened leaflets .  Doppler:   There was no evidence for stenosis.   There was trivial  regurgitation. Peak gradient (D): 5 mm Hg.  ------------------------------------------------------------------- Left atrium:  The atrium was normal in size.  ------------------------------------------------------------------- Atrial septum:  Bubble contrast was given. There was no evidence of PFO or ASD No defect or patent foramen ovale was identified.  ------------------------------------------------------------------- Right ventricle:  The cavity size was normal. Systolic function was normal.  ------------------------------------------------------------------- Pulmonic valve:    Structurally normal valve.   Cusp separation was normal.  Doppler:  Transvalvular velocity was within the normal range. There was no regurgitation.  ------------------------------------------------------------------- Tricuspid valve:   The valve appears to be grossly normal. Doppler:  There was mild regurgitation.  ------------------------------------------------------------------- Right atrium:  The atrium was normal in  size.  ------------------------------------------------------------------- Pericardium:  There was no pericardial effusion.  ------------------------------------------------------------------- Systemic veins: Inferior vena cava: The vessel was normal in size. The respirophasic diameter changes were in the normal range (>= 50%), consistent with normal central venous pressure.  ------------------------------------------------------------------- Post procedure conclusions Ascending Aorta:  - The aorta was normal, not dilated, and non-diseased.  ------------------------------------------------------------------- Measurements   Left ventricle                         Value        Reference  LV ID, ED, PLAX chordal                44.5  mm     43 - 52  LV ID, ES, PLAX chordal                25.6  mm     23 - 38  LV fx shortening, PLAX chordal         42    %      >=29  LV PW thickness, ED                    11.3  mm     ---------  IVS/LV PW ratio, ED                    1            <=1.3  Stroke volume, 2D                      89    ml     ---------  Stroke volume/bsa, 2D                  44    ml/m^2 ---------  LV e&', lateral                         10.1  cm/s   ---------  LV E/e&', lateral                       10.89        ---------  LV e&', medial                          8.99  cm/s   ---------  LV E/e&', medial                        12.24        ---------  LV e&', average  9.55  cm/s   ---------  LV E/e&', average                       11.52        ---------    Ventricular septum                     Value        Reference  IVS thickness, ED                      11.3  mm     ---------    LVOT                                   Value        Reference  LVOT ID, S                             22    mm     ---------  LVOT area                              3.8   cm^2   ---------  LVOT ID                                22    mm     ---------  LVOT peak  velocity, S                  97.8  cm/s   ---------  LVOT mean velocity, S                  63    cm/s   ---------  LVOT VTI, S                            23.5  cm     ---------  LVOT peak gradient, S                  4     mm Hg  ---------  Stroke volume (SV), LVOT DP            89.3  ml     ---------  Stroke index (SV/bsa), LVOT DP         44.5  ml/m^2 ---------    Aorta                                  Value        Reference  Aortic root ID, ED                     36    mm     ---------    Left atrium                            Value        Reference  LA ID, A-P, ES  37    mm     ---------  LA ID/bsa, A-P                         1.84  cm/m^2 <=2.2  LA volume, S                           54    ml     ---------  LA volume/bsa, S                       26.9  ml/m^2 ---------  LA volume, ES, 1-p A4C                 53    ml     ---------  LA volume/bsa, ES, 1-p A4C             26.4  ml/m^2 ---------  LA volume, ES, 1-p A2C                 53    ml     ---------  LA volume/bsa, ES, 1-p A2C             26.4  ml/m^2 ---------    Mitral valve                           Value        Reference  Mitral E-wave peak velocity            110   cm/s   ---------  Mitral A-wave peak velocity            99.3  cm/s   ---------  Mitral deceleration time               222   ms     150 - 230  Mitral peak gradient, D                5     mm Hg  ---------  Mitral E/A ratio, peak                 1.1          ---------    Tricuspid valve                        Value        Reference  Tricuspid regurg peak velocity         255   cm/s   ---------  Tricuspid peak RV-RA gradient          26    mm Hg  ---------    Right ventricle                        Value        Reference  RV s&', lateral, S                      11.8  cm/s   ---------  Legend: (L)  and  (H)  mark values outside specified reference range.  ------------------------------------------------------------------- Prepared  and Electronically Authenticated by  Kristeen Miss, M.D. 2017-09-08T11:45:16   Transesophageal Echocardiography  Patient:    Yadiel, Aubry MR #:       [id number] Study Date: 10/22/2015 Gender:     Judie Petit  Age:        35 Height:     172.7 cm Weight:     86.8 kg BSA:        2.06 m^2 Pt. Status: Room:   ADMITTING    Dietrich Pates, M.D.  ATTENDING    Dietrich Pates, M.D.  ORDERING     Dietrich Pates, M.D.  PERFORMING   Dietrich Pates, M.D.  REFERRING    Dietrich Pates, M.D.  SONOGRAPHER  Sheralyn Boatman  cc:  -------------------------------------------------------------------  ------------------------------------------------------------------- Indications:      424.1 Aortic valve disorders.  ------------------------------------------------------------------- History:   PMH:   Stroke.  ------------------------------------------------------------------- Study Conclusions  - Aortic valve: Large mobile mass along ventricular surface of AV   Measures 2 x 1 cm. Surface is very irregular (frondlike) Appears   to be attached to noncoronary cusp, near base. Base of this cusp   is thickened. Suspicious for fibroelastoma. Mild AI. - Left atrium: No evidence of thrombus in the atrial cavity or   appendage.  ------------------------------------------------------------------- Study data:   Study status:  Routine.  Consent:  The risks, benefits, and alternatives to the procedure were explained to the patient and informed consent was obtained.  Procedure:  Initial setup. The patient was brought to the laboratory. Surface ECG leads were monitored. Sedation. Conscious sedation was administered by cardiology staff. Transesophageal echocardiography. Topical anesthesia was obtained using viscous lidocaine. An adult multiplane transesophageal probe was inserted by the attending cardiologistwithout difficulty. Image quality was adequate.  Study completion:  The patient tolerated the procedure well.  There were no complications.  Administered medications:   Fentanyl, 67.69mcg, IV.  Midazolam, 7mg , IV.          Diagnostic transesophageal echocardiography.  2D and color Doppler.  Birthdate:  Patient birthdate: 11/18/1960.  Age:  Patient is 55 yr old.  Sex:  Gender: male.    BMI: 29.1 kg/m^2.  Blood pressure:     136/87  Patient status:  Inpatient.  Study date:  Study date: 10/22/2015. Study time: 08:08 AM.  Location:  Endoscopy.  -------------------------------------------------------------------  ------------------------------------------------------------------- Left ventricle:  LVEF is normal.  ------------------------------------------------------------------- Aortic valve:  Large mobile mass along ventricular surface of AV Measures 2 x 1 cm. Surface is very irregular (frondlike) Appears to be attached to noncoronary cusp, near base. Base of this cusp is thickened. Suspicious for fibroelastoma. Mild AI.  ------------------------------------------------------------------- Aorta:  Normal thoracic aorta  ------------------------------------------------------------------- Mitral valve:  MV is normal Trace MR.  ------------------------------------------------------------------- Left atrium:   No evidence of thrombus in the atrial cavity or appendage.  ------------------------------------------------------------------- Atrial septum:  NO PFO as tested with injection of agitated saline.   ------------------------------------------------------------------- Pulmonic valve:   PV is normal . Trace PI.  ------------------------------------------------------------------- Tricuspid valve:  TV is normal MIld TR.   ------------------------------------------------------------------- Post procedure conclusions Ascending Aorta:  - Normal thoracic aorta  ------------------------------------------------------------------- Prepared and Electronically Authenticated by  Dietrich Pates, M.D. 2017-09-22T08:20:49   Coronary/Graft Angiography  Conclusion   1. No angiographic evidence of CAD  Recommendations: No further ischemic workup.   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 Cardiac cath indicated for pre-operative exclusion of CAD.   Procedure: The risks, benefits, complications, treatment options, and expected outcomes were discussed with the patient. The patient and/or family concurred with the proposed plan, giving informed consent. The patient was brought to the cath lab after IV hydration was begun and oral premedication was  given. The patient was further sedated with Versed and Fentanyl. The right wrist was assessed with a modified Allens test which was positive. The right wrist was prepped and draped in a sterile fashion. 1% lidocaine was used for local anesthesia. Using the modified Seldinger access technique, a 5 French sheath was placed in the right radial artery. 3 mg Verapamil was given through the sheath. 4500 units IV heparin was given. Standard diagnostic catheters were used to perform selective coronary angiography. I did not cross the aortic valve. The sheath was removed from the right radial artery and a Terumo hemostasis band was applied at the arteriotomy site on the right wrist.      Estimated blood loss <50 mL. . During this procedure the patient was administered the following to achieve and maintain moderate conscious sedation: Versed 3 mg, Fentanyl 75 mcg, while the patient's heart rate, blood pressure, and oxygen saturation were continuously monitored. The period of conscious sedation was 18 minutes, of which I was present face-to-face 100% of this time.    Complications   Complications documented before study signed (10/29/2015 10:07 AM EDT)    CORONARY/GRAFT ANGIOGRAPHY   None Documented by Kathleene Hazel, MD 10/29/2015  10:07 AM EDT  Time Range: Intra-procedure      Coronary Findings   Dominance: Right  Left Anterior Descending  Vessel is large.  Second Diagonal Branch  Vessel is moderate in size.  Third Diagonal Branch  Vessel is small in size.  Ramus Intermedius  Vessel is moderate in size.  Left Circumflex  Vessel is moderate in size.  Second Obtuse Marginal Branch  Vessel is small in size.  Right Coronary Artery  Vessel is large. Vessel is angiographically normal.  Right Posterior Descending Artery  Vessel is large in size.  Coronary Diagrams   Diagnostic Diagram     Implants     No implant documentation for this case.  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  I have personally reviewed the patient's MRI of the brain, recent transthoracic and transesophageal echocardiograms, and diagnostic cardiac catheterization. The mass measures approximately 1 x 2 cm in its greatest dimensions.  It is difficult to tell for certain but the mass appears to be adherent to the ventricular surface of the aortic valve near the base of the left and non-coronary leaflets. The mass is quite mobile.  Anatomical characteristics by echocardiogram are not definitively diagnostic but could be consistent with a papillary fibro-elastoma, vegetation, or thrombus.  The aortic valve is tricuspid and appears to be functioning normally with trivial aortic insufficiency. Left ventricular size and function remains normal. The patient does not have significant coronary artery disease. Recent blood cultures were negative and the patient has no constitutional signs or symptoms to suggest a  history of endocarditis. Patient does have abnormal blood work with severe clumping of platelets and thrombocytopenia on routine complete blood count performed prior to catheterization.   Plan:  I have discussed matters at length with the patient and his wife in the office today. They understand that the only definitive means to identify the etiology  of the mass adherent to his aortic valve is with surgical resection. Given the history of recent small embolic stroke, elective surgical intervention seems most appropriate both for definitive diagnosis and to prevent further embolic events. Surgical resection will likely best be approached through the aortic valve and will likely require excision of at least a portion of the valve leaflets. Although it may be possible to repair the aortic valve, there is a significant chance that valve replacement may be required. Prior to proceeding with surgery the patient will need hematology consultation to rule out the possibility of an underlying hypercoagulable state and further characterize the nature of the patient's abnormal platelet count. If findings are suggestive of the presence of a hypercoagulable state then it might be that the mass actually represents a clot and it might be possible to avoid surgery altogether. During the interim period of time we will obtain a cardiac gated CT angiogram of the heart to further evaluate the aortic valve and the mass itself. I suspect the mass is probably too small and too mobile to be adequately characterize using MRI.  Finally, it might be reasonable for the patient to be formally evaluated by a neurologist, particularly if he continues to have recurrent episodes of diplopia. The patient will return to our office within the next few weeks once he has been formally evaluated by a hematologist. Both he and his wife understand that there remains a small but significant risk of recurrent embolization and possible stroke during the  interim period of time.   I spent in excess of 90 minutes during the conduct of this office consultation and >50% of this time involved direct face-to-face encounter with the patient for counseling and/or coordination of their care.   Salvatore Decentlarence H. Cornelius Moraswen, MD 11/03/2015 2:56 PM

## 2015-11-03 NOTE — Progress Notes (Signed)
Doris - I can see him Wed 10/4 at 1:30 PM. I will put in lab. Please call patient Thanks DrG

## 2015-11-04 ENCOUNTER — Ambulatory Visit (INDEPENDENT_AMBULATORY_CARE_PROVIDER_SITE_OTHER): Payer: BLUE CROSS/BLUE SHIELD | Admitting: Oncology

## 2015-11-04 ENCOUNTER — Other Ambulatory Visit: Payer: Self-pay | Admitting: Oncology

## 2015-11-04 ENCOUNTER — Other Ambulatory Visit: Payer: Self-pay | Admitting: *Deleted

## 2015-11-04 ENCOUNTER — Encounter: Payer: Self-pay | Admitting: Oncology

## 2015-11-04 ENCOUNTER — Encounter (INDEPENDENT_AMBULATORY_CARE_PROVIDER_SITE_OTHER): Payer: Self-pay

## 2015-11-04 ENCOUNTER — Encounter: Payer: Self-pay | Admitting: Hematology and Oncology

## 2015-11-04 VITALS — BP 119/73 | HR 82 | Temp 97.9°F | Ht 68.0 in | Wt 189.9 lb

## 2015-11-04 DIAGNOSIS — Z886 Allergy status to analgesic agent status: Secondary | ICD-10-CM | POA: Diagnosis not present

## 2015-11-04 DIAGNOSIS — Z8673 Personal history of transient ischemic attack (TIA), and cerebral infarction without residual deficits: Secondary | ICD-10-CM | POA: Diagnosis not present

## 2015-11-04 DIAGNOSIS — D696 Thrombocytopenia, unspecified: Secondary | ICD-10-CM

## 2015-11-04 DIAGNOSIS — I359 Nonrheumatic aortic valve disorder, unspecified: Secondary | ICD-10-CM

## 2015-11-04 DIAGNOSIS — I825Z1 Chronic embolism and thrombosis of unspecified deep veins of right distal lower extremity: Secondary | ICD-10-CM | POA: Diagnosis not present

## 2015-11-04 DIAGNOSIS — I358 Other nonrheumatic aortic valve disorders: Secondary | ICD-10-CM

## 2015-11-04 LAB — COMPREHENSIVE METABOLIC PANEL
ALBUMIN: 4.4 g/dL (ref 3.5–5.0)
ALT: 30 U/L (ref 17–63)
ANION GAP: 8 (ref 5–15)
AST: 30 U/L (ref 15–41)
Alkaline Phosphatase: 53 U/L (ref 38–126)
BUN: 20 mg/dL (ref 6–20)
CHLORIDE: 106 mmol/L (ref 101–111)
CO2: 25 mmol/L (ref 22–32)
Calcium: 10.1 mg/dL (ref 8.9–10.3)
Creatinine, Ser: 1.41 mg/dL — ABNORMAL HIGH (ref 0.61–1.24)
GFR calc Af Amer: >60 mL/min (ref >60)
GFR calc non Af Amer: 54 mL/min — ABNORMAL LOW (ref >60)
GLUCOSE: 101 mg/dL — AB (ref 65–99)
POTASSIUM: 4.5 mmol/L (ref 3.5–5.1)
SODIUM: 139 mmol/L (ref 135–145)
Total Bilirubin: 1.2 mg/dL (ref 0.3–1.2)
Total Protein: 7.8 g/dL (ref 6.5–8.1)

## 2015-11-04 LAB — CBC WITH DIFFERENTIAL/PLATELET
BASOS ABS: 0 10*3/uL (ref 0.0–0.1)
BASOS PCT: 0 %
EOS ABS: 0.1 10*3/uL (ref 0.0–0.7)
Eosinophils Relative: 2 %
HCT: 40 % (ref 39.0–52.0)
Hemoglobin: 13.9 g/dL (ref 13.0–17.0)
Lymphocytes Relative: 20 %
Lymphs Abs: 1.2 10*3/uL (ref 0.7–4.0)
MCH: 29.4 pg (ref 26.0–34.0)
MCHC: 34.8 g/dL (ref 30.0–36.0)
MCV: 84.6 fL (ref 78.0–100.0)
MONO ABS: 0.3 10*3/uL (ref 0.1–1.0)
MONOS PCT: 5 %
NEUTROS PCT: 74 %
Neutro Abs: 4.6 10*3/uL (ref 1.7–7.7)
Platelets: DECREASED 10*3/uL (ref 150–400)
RBC: 4.73 MIL/uL (ref 4.22–5.81)
RDW: 12.8 % (ref 11.5–15.5)
WBC: 6.2 10*3/uL (ref 4.0–10.5)

## 2015-11-04 LAB — SAVE SMEAR

## 2015-11-04 LAB — LACTATE DEHYDROGENASE: LDH: 262 U/L — AB (ref 98–192)

## 2015-11-04 MED ORDER — ASPIRIN EC 81 MG PO TBEC: 81.0000 mg | DELAYED_RELEASE_TABLET | Freq: Every day | ORAL | Status: AC

## 2015-11-04 NOTE — Patient Instructions (Signed)
Stop Xarelto when you finish current prescription Begin aspirin 81 mg one daily Schedule an appointment with an eye doctor Return visit with Hematology Dr Reece AgarG in 4-6 weeks

## 2015-11-04 NOTE — Progress Notes (Signed)
New Patient Hematology   Brian Lloyd [id number] 18-Nov-1960 55 y.o. 11/04/2015  CC:   Reason for referral:  Rule out spurious thrombocytopenia in a gentleman who had a DVT in July and recently had a left cerebellar lacunar infarct.   HPI:  55 year old man who I has been an overall excellent health without any major medical or surgical illness. He works restoring cars. He tore a tendon in his left calf at the end of May. He treated this with ice and elevation. About 4-5 weeks later he developed pain, swelling, and discoloration of the left calf. Venous Doppler study done on July 10 showed a deep venous thrombosis from the mid femoral vein down including the peroneal, popliteal, and tibial veins. He was started on anticoagulation with Xarelto. He did well until early September when he developed sudden onset of vertigo and change in his vision which he describes as a disturbance in his vertical but not horizontal vision. No associated headache. MRI of the brain was done on September 5 and I personally reviewed these images with our neuroradiologists. There is a small acute versus subacute area of infarction in the left cerebellum in the distribution of the vertebrobasilar artery.. No other evidence of chronic vascular disease in the brain. He has no cardiovascular risk factors. He is a normotensive, nonsmoker, nondiabetic.  Carotid Doppler studies done on September 8 which showed homogeneous plaque in the right distal common carotid artery, intimal thickening in the distal left common carotid artery, 1-39 percent stenosis in the internal carotid arteries with no obstruction to flow, normal subclavian arteries, normal flow in the vertebral arteries. An echocardiogram with a bubble study done on September 8 showed normal left ventricular ventricular size and function, mild LVH, ejection fraction 60-65 percent, grade 2 diastolic dysfunction. A 1.4 x 0.6 cm vegetation seen on the left ventricular  aspect of the aortic valve. No atrial septal defect or patent foramen ovale identified. Transesophageal echocardiogram done on September 21 confirmed the presence of a large mobile mass on the ventricular surface of the aortic valve. The vegetation had an irregular frond-like appearance and appeared to be attached to a noncoronary cusp near the base. Echo characteristics suspicious for a fibroelastoma. No evidence of thrombus in the atrial cavity or atrial appendage. He was evaluated by cardiology and underwent coronary angiography on September 28 which showed no evidence of coronary artery disease.  Recent CBCs initially recorded on September 18 showed a normal white count, hemoglobin of 14, but platelets could not be measured due to platelet clumping. This occurred again on CBCs done on September 25. A CBC done on September 26 reported platelets of 65,000 and on September 28, 66,000. A blood chemistry profile done today remarkable for an elevated creatinine of 1.4 with a normal BUN of 20, borderline elevation of calcium at 10.1 with normal albumin of 4.4. Normal transaminases. Bilirubin upper normal at 1.2. Mild elevation of LDH 262 lab normal up to 192. CBC with hemoglobin 13.9, MCV 85, white count 6200, 74% neutrophils, 2011 says, 5 monocytes, 2 eosinophils. Platelets again clumped in vitro.  He has no signs or symptoms of a collagen vascular disease. 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 He has had chronic musculoskeletal pain in his neck for years. Naprosyn worked very well for the pain but he had to stop it when he developed an anaphylactic reaction to a medication that contained Naprosyn and another component. He was taking Celebrex without any problems. He has  no history of hepatitis, yellow jaundice, malaria, or mononucleosis. He does not get headaches. He states that he always clots very quickly when he gets cut even since he has been on a blood thinner.  There is no prior history of any renal disease. No thyroid disease. No prior blood clots. No signs or symptoms of infection. His teeth are in good repair. He appreciates the fact that bad teeth can lead to endocarditis. This happened to his father who had to have his infected heart valve replaced.    PMH: Past Medical History:  Diagnosis Date  . 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 Cerebellar infarct (HCC) 10/06/2015   Brain MRI showed acute to subacute lacunar infarct in the left cerebellum  . DVT (deep venous thrombosis) (HCC)    left leg, on Xarelto  . 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 Thrombocytopenia (HCC)   No history of seizure. No history of inflammatory arthritis and specifically denies history of lupus or rheumatoid arthritis.  Past Surgical History:  Procedure Laterality Date  . CARDIAC CATHETERIZATION N/A 10/29/2015   Procedure: Coronary/Graft Angiography;  Surgeon: Kathleene Hazel, MD;  Location: [facility] INVASIVE CV LAB;  Service: Cardiovascular;  Laterality: N/A;  . HERNIA REPAIR Bilateral   . MOUTH SURGERY     root canal  . TEE WITHOUT CARDIOVERSION N/A 10/22/2015   Procedure: TRANSESOPHAGEAL ECHOCARDIOGRAM (TEE);  Surgeon: Pricilla Riffle, MD;  Location: [facility] ENDOSCOPY;  Service: Cardiovascular;  Laterality: N/A;  . TRANSTHORACIC ECHOCARDIOGRAM  10/09/2015   Mild LVH. EF 60-65%. Pseudo-normal grade 2 diastolic dysfunction. 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 TEE recommended.    Allergies: Allergies  Allergen Reactions  . Naproxen Anaphylaxis  Patient states he has taken aspirin in the past and has not had any reactions.  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 He is exposed to paints and blood thinners. Married. Wife accompanies him today. They have no children. He has a 57 year old sister who is obese but  otherwise healthy.  he has never smoked. . he does not use drugs and specifically denies any IV drug use or cocaine use.Marland Kitchen He drinks alcoholic beverages only on rare occasions.  Family History: Family History  Problem Relation Age of Onset  . Heart disease Father   . Hypertension Father   . Heart disease Maternal Grandfather   . Hypertension Maternal Grandfather   . Diabetes Paternal Grandfather   . Heart disease Paternal Grandfather     Review of Systems: See HPI  Physical Exam: Blood pressure 119/73, pulse 82, temperature 97.9 F (36.6 C), temperature source Oral, height 5\' 8"  (1.727 m), weight 189 lb 14.4 oz (86.1 kg), SpO2 99 %. Wt Readings from Last 3 Encounters:  11/04/15 189 lb 14.4 oz (86.1 kg)  11/03/15 190 lb (86.2 kg)  10/29/15 190 lb (86.2 kg)     General appearance: Healthy-appearing Caucasian man HENNT: Pharynx no erythema, exudate, mass, or ulcer.Good dentition. No thyromegaly or thyroid nodules Lymph nodes: No cervical, supraclavicular, or axillary lymphadenopathy Breasts:  Lungs: Clear to auscultation, resonant to percussion throughout Heart: Regular rhythm, no murmur, no gallop, no rub, no click, no edema Abdomen: Soft, nontender, normal bowel sounds, no mass, no organomegaly Extremities: No edema, no calf tenderness. Left calf 42 cm, right 41 cm. Left ankle 23.5 cm, right 22 cm. Musculoskeletal: no joint deformities GU:  Vascular: Carotid pulses 2+, no bruits, distal pulses: Dorsalis pedis and posterior tibial pulses 2+ symmetric Neurologic: Alert, oriented, PERRLA, optic discs sharp, vessels prominent, increased cup-to-disc ratio , no hemorrhage or exudate, cranial nerves grossly normal, motor strength 5 over 5, reflexes 1+ symmetric, upper body coordination normal, gait normal, Skin: No rash or ecchymosis    Lab Results: Lab Results  Component Value Date   WBC 6.2 11/04/2015   HGB 13.9 11/04/2015   HCT 40.0 11/04/2015   MCV 84.6 11/04/2015   PLT   11/04/2015    PLATELET CLUMPS NOTED ON SMEAR, COUNT APPEARS DECREASED     Chemistry      Component Value Date/Time   NA 139 11/04/2015 1350   K 4.5 11/04/2015 1350   CL 106 11/04/2015 1350   CO2 25 11/04/2015 1350   BUN 20 11/04/2015 1350   CREATININE 1.41 (H) 11/04/2015 1350   CREATININE 1.27 10/26/2015 1114      Component Value Date/Time   CALCIUM 10.1 11/04/2015 1350   ALKPHOS 53 11/04/2015 1350   AST 30 11/04/2015 1350   ALT 30 11/04/2015 1350   BILITOT 1.2 11/04/2015 1350          Review of peripheral blood film: Normochromic normocytic red cells. No spherocytes, schistocytes, or polychromasia. No Red cell inclusions. Neutrophils and lymphocytes appear mature. Platelets are mildly decreased 6-8 per high-power field estimated count 80-120,000. There are a number of platelet clumps on the sample anticoagulated with  EDTA. I saw a single platelet clump on the citrate anticoagulated tube.   Radiological Studies: See discussion above    Summary and Impression: 55 year old man with a DVT following trauma to his left calf back in July. Now presents while on full dose Xarelto anticoagulation with a punctate left cerebellar infarction. Evaluation shows an atypical appearing vegetation on the aortic valve. No evidence of a PFO or ASD on TTE or TEE. Normal coronaries. Normal carotid artery Dopplers for age. No obvious risk factors for cerebrovascular disease. No obvious infection. Good dentition. No skin lesions. Platelet clumping noted on multiple blood specimens.  I think we can dispense with the platelet clumping as the etiology of his aortic valve vegetation. This is a in vitro artifact seen in some patients. It has nothing to do with in vivo platelet function as far as we know. This can usually be obviated by drawing the blood in a citrate anticoagulant. This worked partially in this man although I was still able to see some platelet clumping in the citrate sample. My estimate  of his platelet count by blood film review with 6-10 platelets per high-power field is approximately 100-120,000.  With respect to a "hypercoagulable state" one can sometimes see marantic endocarditis in patients with collagen vascular disease and antiphospholipid antibody syndrome. He has no signs or symptoms pointing to a collagen vascular disorder. He has no signs or symptoms to suggest a bacterial  nonbacterial endocarditis. I think the impression of the cardiologist on the TEE that this could represent a fibroelastoma is the most tenable possibility at present.   In the absence of findings of a PFO or ASD, and given the fact that the CNS event happened while he was on full dose anticoagulation, I doubt very much that the clot in his leg is related to what we are seeing in his heart. In fact, I do not think that extending his Xarelto anticoagulation is going to give him any benefit or protection. I think he would be better served  by starting an antiplatelet agent.  Recommendation: I reviewed his scans with our neuro radiologists. They felt that a cardiac MRI might be able to tell clot versus solid mass on the aortic valve. I discussed the situation with the referring cardio vascular surgeon Dr. Cornelius Moras. He feels the lesion is too small to be adequately assessed by cardiac MRI and has scheduled a CT scan. The patient will complete 3 months of anticoagulation in the next few days. I advised him to continue the Xarelto until he completed the remaining pills in his bottle but to begin aspirin 81 mg daily today and continue the aspirin after he stops the Xarelto. I am going to screen him for antiphospholipid antibodies. This is one of the rare situations in hematology where one can see both arterial and venous thrombosis in the same individual. The other is in association with a myeloproliferative disorder. There is no evidence, given his normal hemoglobin and white count, that this is the case in this  gentleman.  Retinal vessels are prominent with an increased cup-to-disc ratio. I advised him to get a formal ophthalmologic consultation.  This is a challenging situation. He may ultimately come to cardiac valve surgery.  Time spent on this consult 1 hour of direct face-to-face time with the patient and his wife, an additional 3 hours reviewing peripheral blood film, radiographs, ancillary data, and dictation.    Cephas Darby, MD, FACP  Hematology-Oncology/Internal Medicine  11/04/2015, 4:07 PM

## 2015-11-05 ENCOUNTER — Other Ambulatory Visit: Payer: Self-pay | Admitting: *Deleted

## 2015-11-05 DIAGNOSIS — I358 Other nonrheumatic aortic valve disorders: Secondary | ICD-10-CM

## 2015-11-05 LAB — BETA-2-GLYCOPROTEIN I ABS, IGG/M/A
BETA 2 GLYCO 1 IGM: 77 GPI IgM units — AB (ref 0–32)
Beta-2 Glyco 1 IgA: 60 GPI IgA units — ABNORMAL HIGH (ref 0–25)
Beta-2 Glyco I IgG: 98 GPI IgG units — ABNORMAL HIGH (ref 0–20)

## 2015-11-06 ENCOUNTER — Other Ambulatory Visit: Payer: Self-pay | Admitting: Oncology

## 2015-11-06 ENCOUNTER — Encounter: Payer: Self-pay | Admitting: Oncology

## 2015-11-06 DIAGNOSIS — D6861 Antiphospholipid syndrome: Secondary | ICD-10-CM

## 2015-11-06 DIAGNOSIS — I330 Acute and subacute infective endocarditis: Secondary | ICD-10-CM | POA: Insufficient documentation

## 2015-11-06 DIAGNOSIS — I824Y1 Acute embolism and thrombosis of unspecified deep veins of right proximal lower extremity: Secondary | ICD-10-CM

## 2015-11-06 DIAGNOSIS — I63111 Cerebral infarction due to embolism of right vertebral artery: Secondary | ICD-10-CM

## 2015-11-06 HISTORY — DX: Antiphospholipid syndrome: D68.61

## 2015-11-06 LAB — CARDIOLIPIN ANTIBODIES, IGG, IGM, IGA
ANTICARDIOLIPIN IGA: 10 U/mL (ref 0–11)
ANTICARDIOLIPIN IGM: 45 [MPL'U]/mL — AB (ref 0–12)
Anticardiolipin IgG: 132 GPL U/mL — ABNORMAL HIGH (ref 0–14)

## 2015-11-06 MED ORDER — WARFARIN SODIUM 5 MG PO TABS: 5.0000 mg | ORAL_TABLET | Freq: Every day | ORAL | 11 refills | Status: DC

## 2015-11-06 NOTE — Progress Notes (Signed)
Gentleman I just evaluated 2 days ago referred for further evaluation of in vitro platelet clumping. I had repeat blood samples drawn in both EDTA and citrate anticoagulants. In vitro platelet clumping is usually a artifact which results in a spuriously low platelet count. I reviewed his peripheral blood film. This confirmed platelet clumping. This was more prominent in the EDTA specimen but there were still some platelet clumps in the citrate specimen. Estimate of platelet count by visual inspection was 6-8 per high-power field which would be approximately 90-100,000, still lower than normal but higher than the machine count of 65,000. Lab just returned today showing very high titers of anti-phospholipid antibodies to both anticardiolipin and beta 2 glycoprotein 1 both IgG and IgM. Given recent venous clot in his left lower extremity and presumable arterial embolus to the left vertebrobasilar artery system with findings of a punctate infarction in the cerebellum, together with the antibody studies, this is diagnostic for antiphospholipid antibody syndrome. This may explain the vegetation on his aortic valve. This could very well be marantic nonbacterial endocarditis. There is controversy in the literature on the best way to anticoagulate these patients. It is my opinion given both venous and arterial events that he should be on both Coumadin and low-dose aspirin. We have seen an unacceptable number of recurrent thrombotic events and antiphospholipid antibody patients anticoagulated with the NOACs which might explain why he had these events while on Xarelto. With respect to marantic endocarditis, there is literature in patients with lupus that Plaquenil decreases the formation of new lesions. I'm not sure that this can be extrapolated to this man's situation. He has no signs or symptoms of a collagen vascular disorder.  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 This is a complicated situation. He seemed to have a good understanding of what we talked about. I'm going to have him start Coumadin today 5 mg daily. Stop Xarelto on Sunday. Come in Monday for a PT/INR. He will likely need a Lovenox bridge until he is fully anticoagulated on the Coumadin. He is advised to continue low-dose aspirin 81 mg which I started on October 4.

## 2015-11-06 NOTE — Progress Notes (Signed)
Man I just saw 2 days ago to evaluate platelet clumping in setting of recent venous (LLE) DVT & arterial (cerebellar/vertebrobasialr artery infarction). Review of blood film showed platelet clumping in both EDTA & citrate anticoagulant but more pronounced in EDTA.  Platelet estimate by my review 6-8 platelets/HPF estimate 90-120,000. Lab returned with high positive antibody titers to both IgG & IgM  anticardiolipin & beta-2-glycoprotein-1. Findings in combination with recent thrombotic events diagnostic for antiphospholipid antibody syndrome. I called and discussed with patient. I  am advising that he start coumadin 5 mg daily today. Take last dose of Xarelto Sunday. Come to office Monday for PT/INR. He will likely need lovenox bridge until coumadin therapeutic.  Continue ASA 81 mg which I started on 10/4. 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  Controversy in literature on best way to anticoagulate these people. In my opinion, given both venous & arterial events, he should be on both coumadin & aspirin long term. There is some literature in patients with lupus that plaquenil may prevent formation of new vegetations. I am not sure this can be extrapolated to this patient with no signs or symptoms of a collagen vascular disorder. I will check ANA & ESR.

## 2015-11-09 ENCOUNTER — Other Ambulatory Visit: Payer: BLUE CROSS/BLUE SHIELD

## 2015-11-09 ENCOUNTER — Telehealth: Payer: Self-pay | Admitting: Pharmacist

## 2015-11-09 NOTE — Telephone Encounter (Signed)
Contacted patient for interchange from rivaroxaban to warfarin (bridge w/ enoxaparin). Patient states he wants to hold on switching for now until plans for cardiac surgery are in place. Provided education to patient that warfarin is preferred clinically due to thrombosis on rivaroxaban therapy, antiphospholipid antibody syndrome, and heart valve conditions. Patient verbalized understanding and insisted on maintaining on rivaroxaban for now. Will follow up with patient within 1 week to re-visit anticoagulation plan of care. Dr. Cyndie ChimeGranfortuna notified.

## 2015-11-10 ENCOUNTER — Ambulatory Visit (INDEPENDENT_AMBULATORY_CARE_PROVIDER_SITE_OTHER)
Admission: RE | Admit: 2015-11-10 | Discharge: 2015-11-10 | Disposition: A | Discharge status: Home / Self Care | Payer: BLUE CROSS/BLUE SHIELD | Source: Ambulatory Visit | Attending: Cardiology | Admitting: Cardiology

## 2015-11-10 DIAGNOSIS — I359 Nonrheumatic aortic valve disorder, unspecified: Secondary | ICD-10-CM | POA: Diagnosis not present

## 2015-11-10 DIAGNOSIS — I358 Other nonrheumatic aortic valve disorders: Secondary | ICD-10-CM

## 2015-11-10 MED ORDER — IOPAMIDOL (ISOVUE-370) INJECTION 76%
80.0000 mL | Freq: Once | INTRAVENOUS | Status: AC | PRN
  Administered 2015-11-10: 80 mL via INTRAVENOUS

## 2015-11-11 ENCOUNTER — Encounter: Payer: BLUE CROSS/BLUE SHIELD | Admitting: Hematology and Oncology

## 2015-11-11 ENCOUNTER — Encounter: Payer: Self-pay | Admitting: Thoracic Surgery (Cardiothoracic Vascular Surgery)

## 2015-11-11 ENCOUNTER — Ambulatory Visit (INDEPENDENT_AMBULATORY_CARE_PROVIDER_SITE_OTHER): Payer: BLUE CROSS/BLUE SHIELD | Admitting: Thoracic Surgery (Cardiothoracic Vascular Surgery)

## 2015-11-11 ENCOUNTER — Other Ambulatory Visit: Payer: Self-pay | Admitting: *Deleted

## 2015-11-11 VITALS — BP 133/88 | HR 66 | Resp 18 | Ht 68.0 in | Wt 190.0 lb

## 2015-11-11 DIAGNOSIS — D6861 Antiphospholipid syndrome: Secondary | ICD-10-CM

## 2015-11-11 DIAGNOSIS — I358 Other nonrheumatic aortic valve disorders: Secondary | ICD-10-CM

## 2015-11-11 DIAGNOSIS — I359 Nonrheumatic aortic valve disorder, unspecified: Secondary | ICD-10-CM

## 2015-11-11 DIAGNOSIS — I330 Acute and subacute infective endocarditis: Secondary | ICD-10-CM

## 2015-11-11 DIAGNOSIS — M3211 Endocarditis in systemic lupus erythematosus: Secondary | ICD-10-CM | POA: Diagnosis not present

## 2015-11-11 NOTE — Progress Notes (Signed)
[address]       [place] [address]             [phone]     CARDIOTHORACIC SURGERY OFFICE NOTE  Referring Provider is Pricilla Riffle, MD  Primary Cardiologist is Marykay Lex, MD PCP is Lovenia Kim, PA-C   HPI:  Patient returns to the office today for follow-up of the small somewhat mobile-appearing mass noted on the ventricular surface of the aortic valve on previous transthoracic and transesophageal echocardiograms. He was originally seen in consultation on 11/03/2015. Since then he was seen in consultation by Dr. Cyndie Chime who has found that the patient has anti-phospholipid antibody syndrome documented by the presence of significant elevation of IgG anticardiolipin and beta-2-GP-1 antibodies. He has recommended that the patient be transitioned to warfarin and aspirin for long-term anticoagulation. The patient has also undergone cardiac gated CT angiogram of the heart to further evaluate the anatomical characteristics associated with this mass, and the patient returns to our office today for follow-up. He has not yet started taking warfarin and currently remains anticoagulated using Xarelto.  He reports no new problems or complaints since his last office visit.   Current Outpatient Prescriptions  Medication Sig Dispense Refill  . aspirin EC 81 MG tablet Take 1 tablet (81 mg total) by mouth daily. Patient states he has taken aspirin in the past without getting a reaction    . Multiple Vitamin (MULTIVITAMIN) tablet Take 1 tablet by mouth daily.    Marland Kitchen warfarin (COUMADIN) 5 MG tablet Take 1 tablet (5 mg total) by mouth daily. (Patient not taking: Reported on 11/11/2015) 30 tablet 11   No current facility-administered medications for this visit.       Physical Exam:   BP 133/88 (BP Location: Right Arm, Cuff Size: Normal)   Pulse 66   Resp 18   Ht 5\' 8"  (1.727 m)   Wt 190 lb (86.2 kg)   SpO2 98%   BMI 28.89 kg/m    General:  Well-appearing  Chest:   Clear to auscultation  CV:   Regular rate and rhythm without murmur  Incisions:  n/a  Abdomen:  Soft  Extremities:  Warm and well-perfused  Diagnostic Tests:  Cardiac CTA  TECHNIQUE: The patient was scanned on a Philips 256 scanner. A 120 kV retrospective scan was triggered in the descending thoracic aorta at 111 HU's. Gantry rotation speed was 270 msecs and collimation was .9 mm. No beta blockade or nitro were given. The 3D data set was reconstructed in 5% intervals of the R-R cycle. Systolic and diastolic phases were analyzed on a dedicated work station using MPR, MIP and VRT modes. The patient received 80 cc of contrast.  FINDINGS: Aortic Valve: Trileaflet. No calcifications. There is a lesion in between left and non-coronary cusp that measures 17 x 12 x 9 mm that could represents a thrombus or a fibroelastoma.  Aorta:  Normal size, no calcifications, no dissection.  Sinotubular Junction:  30 x 30 mm  Ascending Thoracic Aorta:  31 x 31 mm  Aortic Arch:  28 x 27 mm  Descending Thoracic Aorta:  27 x 27 mm  Sinus of Valsalva Measurements:  Non-coronary:  36 mm  Right -coronary:  34 mm  Left -coronary:  36 mm  Coronary Arteries:  The study was performed without use of NTG.  There is right dominance. Left coronary artery originates posteriorly in between left and non-coronary cusp and continues anteriorly in a usual  fashion. There is no plaque in the proximal arteries.  There is normal pulmonary vein drainage into the left atrium.  Left atrial appendage has no thrombus.  Normal size of the pulmonary artery.  IMPRESSION: 1. There is a lesion in between left and non-coronary cusp that measures 17 x 12 x 9 mm that could represents a thrombus or a fibroelastoma. There is no invasion into the adjacent structures.  An aggressive anticoagulation and follow up TEE in 3-4 weeks is recommended.  2. Normal  size of the thoracic aorta, no calcifications, no dissection.  3. Left coronary artery originates posteriorly in between left and non-coronary cusp and continues anteriorly in a usual fashion. There is no plaque in the proximal arteries.  Tobias AlexanderKatarina Nelson   Electronically Signed   By: Tobias AlexanderKatarina  Nelson   On: 11/10/2015 17:30    Impression:  I completely agree with Dr. 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 Unfortunately, that does not come with adequate clinical data to definitively make a recommendation for treatment. With regards to the small mass noted on echocardiogram adherent to the ventricular surface of the aortic valve, the diagnosis makes perfect sense. The appearance of this mass on TEE and cardiac gated CT are most consistent with the appearance of a vegetation. In the setting of Libman-Sacks endocarditis this is not infectious but presumably an autoimmune phenomenon with the vegetation consisting of a combination of inflammatory cells and components of clot. He only definitive means to establish a diagnosis would be with surgical resection. Risk of recurrent embolization with or without long-term anticoagulation is not well-defined. Given the size of this lesion and the fact that surgery should come with relatively low associated risks, I favor surgical resection. Moreover, presuming this is a noninfectious vegetation surgery should come with relatively high likelihood that the aortic valve can be preserved.    Plan:  I discussed matters at length with the patient and his wife in the office today. The indications, risks, and potential benefits of surgery have been discussed in detail. The primary indications include ability to obtain a definitive diagnosis and eliminate risk for repeat embolization. Risks associated with surgery should be very low given the fact that the patient is  relatively young, has normal left ventricular function, does not have significant coronary artery disease, and has relatively few comorbid medical problems. He certainly might be at risk for additional clotting or embolic events, but hopefully this should be mediated through the use of systemic anticoagulation. The likelihood that his aortic valve should be able to be preserved remained high but there remains a chance that valve repair or replacement might be necessary. Long-term risks without surgical intervention are somewhat difficult to predict. All of the patient's questions have been answered. He desires to proceed with surgery at some point within the next few weeks.  We tented we plan to proceed with resection of the aortic valve mass on Wednesday, 12/09/2015. The patient will return to our office for follow-up on Monday, 12/07/2015. He is now agreeable with transitioning to warfarin and aspirin anticoagulation as previously recommended by Dr. Cyndie ChimeGranfortuna.  I have instructed the patient to stop taking warfarin on Friday, 12/04/2015 in anticipation of his upcoming surgery. We will make arrangements for Lovenox injections for bridging.  All of his questions have been addressed.  I spent in excess of 30 minutes during the conduct of this office consultation and >50% of this time involved direct face-to-face encounter  with the patient for counseling and/or coordination of their care.    Salvatore Decent. Cornelius Moras, MD 11/11/2015 3:21 PM

## 2015-11-11 NOTE — Patient Instructions (Signed)
Continue all previous medications without any changes at this time  Transition to warfarin and aspirin as instructed by Dr Cyndie ChimeGranfortuna  Stop taking warfarin (Coumadin) on Friday 12/03/2015 in anticipation of surgery  Begin lovenox injections after stopping warfarin

## 2015-11-12 ENCOUNTER — Encounter: Payer: Self-pay | Admitting: Oncology

## 2015-11-12 ENCOUNTER — Encounter: Payer: BLUE CROSS/BLUE SHIELD | Admitting: Thoracic Surgery (Cardiothoracic Vascular Surgery)

## 2015-11-12 ENCOUNTER — Telehealth: Payer: Self-pay | Admitting: Oncology

## 2015-11-12 ENCOUNTER — Other Ambulatory Visit: Payer: Self-pay | Admitting: Oncology

## 2015-11-12 DIAGNOSIS — I824Y2 Acute embolism and thrombosis of unspecified deep veins of left proximal lower extremity: Secondary | ICD-10-CM

## 2015-11-12 DIAGNOSIS — D6861 Antiphospholipid syndrome: Secondary | ICD-10-CM

## 2015-11-12 DIAGNOSIS — I63112 Cerebral infarction due to embolism of left vertebral artery: Secondary | ICD-10-CM

## 2015-11-12 MED ORDER — ENOXAPARIN SODIUM 150 MG/ML ~~LOC~~ SOLN: 120.0000 mg | SUBCUTANEOUS | Status: DC

## 2015-11-12 MED ORDER — ENOXAPARIN SODIUM 120 MG/0.8ML ~~LOC~~ SOLN: 120.0000 mg | SUBCUTANEOUS | 3 refills | Status: DC

## 2015-11-12 NOTE — Progress Notes (Signed)
Mr Brian PetitM. Malvin JohnsSaw Dr Cornelius Moraswen, CVTS, yesterday. Decision to proceed with surgery to remove vegetation from aortic valve on 11/8.  I have advised patient to start Coumadin on Friday, 10/13; stop Xarelto on Saturday 10/14. Report to my office Tues AM  10/17 for PT/INR check. If not therapeutic, lovenox bridge. Stop coumadin 5 days before surgery. Peri-op lovenox bridge. Patient has my cell phone # to call if any issues.

## 2015-11-12 NOTE — Telephone Encounter (Signed)
warfarin (COUMADIN) 5 MG tablet pt has questions about the medication Call the home phone unless after lunch then call the cell [phone]

## 2015-11-12 NOTE — Telephone Encounter (Signed)
Pt has questions about coumadin He states he is going out of town and will not be available for lab work until Tuesday am 10/17, he has not started the coumadin If you would like to speak to him you may call his home # before lunch or his cell at lunch or after Please advise

## 2015-11-13 ENCOUNTER — Telehealth: Payer: Self-pay | Admitting: Pharmacist

## 2015-11-13 NOTE — Telephone Encounter (Signed)
Tried contacting patient to schedule appointment for follow-up warfarin/enoxaparin bridge. Unable to reach patient.

## 2015-11-16 ENCOUNTER — Other Ambulatory Visit: Payer: Self-pay | Admitting: *Deleted

## 2015-11-16 NOTE — Telephone Encounter (Signed)
I spoke with him on Friday and he was instructed to start lovenox 120 mg daily and coumadin 5 mg daily. He will come in Tues 10/17 for PT/INR.

## 2015-11-17 ENCOUNTER — Other Ambulatory Visit: Payer: Self-pay | Admitting: Oncology

## 2015-11-17 ENCOUNTER — Encounter: Payer: Self-pay | Admitting: Oncology

## 2015-11-17 ENCOUNTER — Other Ambulatory Visit (INDEPENDENT_AMBULATORY_CARE_PROVIDER_SITE_OTHER): Payer: BLUE CROSS/BLUE SHIELD

## 2015-11-17 DIAGNOSIS — I824Y1 Acute embolism and thrombosis of unspecified deep veins of right proximal lower extremity: Secondary | ICD-10-CM

## 2015-11-17 DIAGNOSIS — D6861 Antiphospholipid syndrome: Secondary | ICD-10-CM

## 2015-11-17 DIAGNOSIS — I63111 Cerebral infarction due to embolism of right vertebral artery: Secondary | ICD-10-CM

## 2015-11-17 DIAGNOSIS — M3211 Endocarditis in systemic lupus erythematosus: Secondary | ICD-10-CM

## 2015-11-17 DIAGNOSIS — I330 Acute and subacute infective endocarditis: Secondary | ICD-10-CM

## 2015-11-17 DIAGNOSIS — I639 Cerebral infarction, unspecified: Secondary | ICD-10-CM

## 2015-11-17 DIAGNOSIS — I825Z1 Chronic embolism and thrombosis of unspecified deep veins of right distal lower extremity: Secondary | ICD-10-CM

## 2015-11-17 LAB — CBC WITH DIFFERENTIAL/PLATELET
BASOS ABS: 0 10*3/uL (ref 0.0–0.1)
BASOS PCT: 0 %
Eosinophils Absolute: 0.2 10*3/uL (ref 0.0–0.7)
Eosinophils Relative: 4 %
HEMATOCRIT: 42.5 % (ref 39.0–52.0)
HEMOGLOBIN: 15.1 g/dL (ref 13.0–17.0)
LYMPHS PCT: 26 %
Lymphs Abs: 1.3 10*3/uL (ref 0.7–4.0)
MCH: 29.7 pg (ref 26.0–34.0)
MCHC: 35.5 g/dL (ref 30.0–36.0)
MCV: 83.7 fL (ref 78.0–100.0)
Monocytes Absolute: 0.3 10*3/uL (ref 0.1–1.0)
Monocytes Relative: 5 %
NEUTROS ABS: 3.2 10*3/uL (ref 1.7–7.7)
NEUTROS PCT: 64 %
Platelets: DECREASED 10*3/uL (ref 150–400)
RBC: 5.08 MIL/uL (ref 4.22–5.81)
RDW: 12.8 % (ref 11.5–15.5)
WBC: 5 10*3/uL (ref 4.0–10.5)

## 2015-11-17 LAB — PROTIME-INR
INR: 1.22
PROTHROMBIN TIME: 15.5 s — AB (ref 11.4–15.2)

## 2015-11-17 NOTE — Addendum Note (Signed)
Addended by: Bufford SpikesFULCHER, Neeley Sedivy N on: 11/17/2015 10:59 AM   Modules accepted: Orders

## 2015-11-17 NOTE — Progress Notes (Signed)
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 Found to have a vegetation on aortic valve and significant elevation of antiphospholipid antibodies. Started lovenox 1.5 mg/kg SQ 10/13 & coumadin 5 mg QD. ASA 81 mg started 10/11.In for PT/INR this AM. I called to advise him he is still subtherapeutic and needs to increase coumadin to 7.5 mg daily and continue lovenox. He reports  1 episode over weekend and one this AM of recurrent neuro symptoms with change in vision & transient memory loss. I told him to come to ED now so we can get an MRI. He feels fine now and wants to wait until tomorrow. I told him if he has another spell tonight, he needs to come to hospital tonight. He agreed.

## 2015-11-18 ENCOUNTER — Ambulatory Visit (HOSPITAL_COMMUNITY)
Admission: RE | Admit: 2015-11-18 | Discharge: 2015-11-18 | Disposition: A | Discharge status: Home / Self Care | Payer: BLUE CROSS/BLUE SHIELD | Source: Ambulatory Visit | Attending: Oncology | Admitting: Oncology

## 2015-11-18 ENCOUNTER — Other Ambulatory Visit: Payer: Self-pay | Admitting: Oncology

## 2015-11-18 ENCOUNTER — Encounter (HOSPITAL_COMMUNITY): Payer: Self-pay | Admitting: Oncology

## 2015-11-18 ENCOUNTER — Inpatient Hospital Stay (HOSPITAL_COMMUNITY)
Admission: AD | Admit: 2015-11-18 | Discharge: 2015-11-20 | DRG: 065 | Disposition: A | Discharge status: Home / Self Care | Payer: BLUE CROSS/BLUE SHIELD | Source: Ambulatory Visit | Attending: Internal Medicine | Admitting: Internal Medicine

## 2015-11-18 DIAGNOSIS — I825Z2 Chronic embolism and thrombosis of unspecified deep veins of left distal lower extremity: Secondary | ICD-10-CM | POA: Diagnosis present

## 2015-11-18 DIAGNOSIS — I509 Heart failure, unspecified: Secondary | ICD-10-CM

## 2015-11-18 DIAGNOSIS — M3211 Endocarditis in systemic lupus erythematosus: Secondary | ICD-10-CM | POA: Diagnosis present

## 2015-11-18 DIAGNOSIS — I63112 Cerebral infarction due to embolism of left vertebral artery: Secondary | ICD-10-CM

## 2015-11-18 DIAGNOSIS — R42 Dizziness and giddiness: Secondary | ICD-10-CM | POA: Diagnosis present

## 2015-11-18 DIAGNOSIS — I2510 Atherosclerotic heart disease of native coronary artery without angina pectoris: Secondary | ICD-10-CM | POA: Diagnosis not present

## 2015-11-18 DIAGNOSIS — B9689 Other specified bacterial agents as the cause of diseases classified elsewhere: Secondary | ICD-10-CM

## 2015-11-18 DIAGNOSIS — N179 Acute kidney failure, unspecified: Secondary | ICD-10-CM | POA: Diagnosis present

## 2015-11-18 DIAGNOSIS — I824Y2 Acute embolism and thrombosis of unspecified deep veins of left proximal lower extremity: Secondary | ICD-10-CM

## 2015-11-18 DIAGNOSIS — I6340 Cerebral infarction due to embolism of unspecified cerebral artery: Secondary | ICD-10-CM | POA: Diagnosis present

## 2015-11-18 DIAGNOSIS — I358 Other nonrheumatic aortic valve disorders: Secondary | ICD-10-CM

## 2015-11-18 DIAGNOSIS — J9811 Atelectasis: Secondary | ICD-10-CM

## 2015-11-18 DIAGNOSIS — D696 Thrombocytopenia, unspecified: Secondary | ICD-10-CM

## 2015-11-18 DIAGNOSIS — Z7901 Long term (current) use of anticoagulants: Secondary | ICD-10-CM

## 2015-11-18 DIAGNOSIS — I63119 Cerebral infarction due to embolism of unspecified vertebral artery: Secondary | ICD-10-CM | POA: Diagnosis not present

## 2015-11-18 DIAGNOSIS — I639 Cerebral infarction, unspecified: Secondary | ICD-10-CM | POA: Diagnosis not present

## 2015-11-18 DIAGNOSIS — I330 Acute and subacute infective endocarditis: Secondary | ICD-10-CM | POA: Diagnosis not present

## 2015-11-18 DIAGNOSIS — I825Z1 Chronic embolism and thrombosis of unspecified deep veins of right distal lower extremity: Secondary | ICD-10-CM

## 2015-11-18 DIAGNOSIS — R740 Nonspecific elevation of levels of transaminase and lactic acid dehydrogenase [LDH]: Secondary | ICD-10-CM | POA: Diagnosis not present

## 2015-11-18 DIAGNOSIS — Z888 Allergy status to other drugs, medicaments and biological substances status: Secondary | ICD-10-CM

## 2015-11-18 DIAGNOSIS — D6861 Antiphospholipid syndrome: Secondary | ICD-10-CM

## 2015-11-18 DIAGNOSIS — I82402 Acute embolism and thrombosis of unspecified deep veins of left lower extremity: Secondary | ICD-10-CM

## 2015-11-18 DIAGNOSIS — Z8249 Family history of ischemic heart disease and other diseases of the circulatory system: Secondary | ICD-10-CM | POA: Diagnosis not present

## 2015-11-18 DIAGNOSIS — Z86718 Personal history of other venous thrombosis and embolism: Secondary | ICD-10-CM

## 2015-11-18 DIAGNOSIS — D691 Qualitative platelet defects: Secondary | ICD-10-CM | POA: Diagnosis not present

## 2015-11-18 DIAGNOSIS — Z833 Family history of diabetes mellitus: Secondary | ICD-10-CM | POA: Diagnosis not present

## 2015-11-18 DIAGNOSIS — Z8673 Personal history of transient ischemic attack (TIA), and cerebral infarction without residual deficits: Secondary | ICD-10-CM | POA: Diagnosis not present

## 2015-11-18 DIAGNOSIS — I359 Nonrheumatic aortic valve disorder, unspecified: Secondary | ICD-10-CM | POA: Diagnosis not present

## 2015-11-18 DIAGNOSIS — H532 Diplopia: Secondary | ICD-10-CM

## 2015-11-18 HISTORY — DX: Cerebral infarction, unspecified: I63.9

## 2015-11-18 MED ORDER — GADOBENATE DIMEGLUMINE 529 MG/ML IV SOLN
20.0000 mL | Freq: Once | INTRAVENOUS | Status: AC
  Administered 2015-11-18: 18 mL via INTRAVENOUS

## 2015-11-18 MED ORDER — HEPARIN (PORCINE) IN NACL 100-0.45 UNIT/ML-% IJ SOLN
1150.0000 [IU]/h | INTRAMUSCULAR | Status: DC
  Administered 2015-11-18: 1200 [IU]/h via INTRAVENOUS
  Filled 2015-11-18: qty 250

## 2015-11-18 MED ORDER — HEPARIN (PORCINE) IN NACL 100-0.45 UNIT/ML-% IJ SOLN: 1200.0000 [IU]/h | INTRAMUSCULAR | Status: DC

## 2015-11-18 MED ORDER — HEPARIN (PORCINE) IN NACL 100-0.45 UNIT/ML-% IJ SOLN
1200.0000 [IU]/h | INTRAMUSCULAR | Status: DC
  Filled 2015-11-18: qty 250

## 2015-11-18 MED ORDER — SODIUM CHLORIDE 0.9% FLUSH
3.0000 mL | Freq: Two times a day (BID) | INTRAVENOUS | Status: DC
  Administered 2015-11-18: 3 mL via INTRAVENOUS
  Administered 2015-11-19: 10 mL via INTRAVENOUS
  Administered 2015-11-19: 3 mL via INTRAVENOUS

## 2015-11-18 NOTE — Consult Note (Signed)
Referring MD: Dr. 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      HPI: 55 year old man I just saw for the first time on October 4 referred to evaluate thrombocytopenia. Please see my office consult for complete details. To summarize, he tore a tendon in his left calf at the end of May. He developed a left lower extremity DVT on July 10 and was started on anticoagulation with Xarelto. While on anticoagulation, he developed sudden onset of vertigo and change in vision. MRI done on September 5 showed a small acute versus subacute infarction in the left cerebellum. Carotid Dopplers were normal except for some plaque in the right distal common carotid. No obstruction to flow. Echocardiogram with bubble study done September 8 with normal left ventricular size and function, mild LVH, EF 60-65 percent, grade 2 diastolic dysfunction. A 1.4 x 0.6. Centimeter vegetation seen on the left ventricular aspect of the aortic valve. No ASD or PFO on bubble study. TEE on 9/21 confirmed a large mobile mass on the aortic valve. It was attached to the noncoronary cusp. No thrombus in the atrial cavity or atrial appendage. Coronary angiography done September 28 showed no coronary artery disease. CT coronary angiogram done on October 10 confirms the presence of the vegetation in between the left and noncoronary cusp of the aortic valve. 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 The patient had no signs or symptoms of infection. White count 6200 with 74% neutrophils. No fevers. ESR 2 mm Good dentition. No signs or symptoms of a collagen vascular disorder. Borderline elevation of creatinine at 1.4. Platelet count recorded as 66,000 but platelet clumping noted on smear. I reviewed the blood film with a citrate anticoagulant. This got rid of most of the platelet clumps but not all.  I estimated that his platelet count was approximately 100-120,000. I sent labs for antiphospholipid antibodies and both anticardiolipin antibodies and antibodies to beta 2 glycoprotein 1 were significantly elevated. Lupus anticoagulant couldn't be checked since he was on Xarelto at that time. 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 I stopped the Xarelto and started him on Lovenox 1.5 mg/kg daily, Coumadin 5 mg daily, and aspirin 81 mg daily beginning on October 13. INR 1.2 after 4 doses of Coumadin checked on October 17. When I called the patient on the evening of October 17 to tell him the lab values and advise him to continue the Lovenox and increase his Coumadin to 7.5 mg daily, he reported to me that over the weekend he had another episode of change in vision and vertigo on Sunday with loss of memory for the event. He had another episode on Tuesday morning. I strongly encouraged him to come to the hospital for further evaluation. He elected to wait until today. MRI and MRA were done. They showed 2 new small areas of acute infarction 1 in the left and the other in the right frontal lobes. Evolutionary changes in the area of the previous infarction in the left cerebellum. I called the patient in for urgent evaluation and treatment. He has not had any headaches, dysarthria, focal weakness or paresthesias. He continues to have pain in his neck and trapezius muscles. Acute on chronic.     Past Medical History:  Diagnosis Date  . 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 Antiphospholipid antibody syndrome ([place]) 11/06/2015   11/04/15 significant elevation of IgG anticardiolipin & beta-2-GP-1 antibodies  . 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 DVT (deep venous thrombosis) ([facility])    left leg, on Xarelto  . 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 Thrombocytopenia ([place])   :No history of hepatitis, yellow jaundice, malaria, or mononucleosis. No prior history of a collagen vascular disorder.   Past Surgical History:  Procedure Laterality Date  . CARDIAC CATHETERIZATION N/A 10/29/2015   Procedure: Coronary/Graft Angiography;  Surgeon: Burnell Blanks, MD;  Location: [facility];  Service: Cardiovascular;  Laterality: N/A;  . HERNIA REPAIR Bilateral   . MOUTH SURGERY     root canal  . TEE WITHOUT CARDIOVERSION N/A 10/22/2015   Procedure: TRANSESOPHAGEAL ECHOCARDIOGRAM (TEE);  Surgeon: Fay Records, MD;  Location: [facility] ENDOS[facility];  Service: Cardiovascular;  Laterality: N/A;  . TRANSTHORACIC ECHOCARDIOGRAM  10/09/2015   Mild LVH. EF 60-65%. Pseudo-normal grade 2 diastolic dysfunction. 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 TEE recommended.  :  :  Allergies  Allergen Reactions  . Naproxen Anaphylaxis  :  Family History  Problem Relation Age of Onset  . Heart disease Father   . Hypertension Father   . Heart disease Maternal Grandfather   . Hypertension Maternal Grandfather   . Diabetes Paternal Grandfather   . Heart disease Paternal Grandfather   :See my office consult note. No family member with collagen vascular disorder. His father had endocarditis and required a heart valve replacement. Source of infection felt to be a carious tooth.   Social History   Social History  . Marital status: Married    Spouse name: N/A  . Number of children: None   . Years of education: N/A   Occupational History  . He works as a Cabin crew    Social History Main Topics  . Smoking status: Never Smoker  . Smokeless tobacco: Never Used  . Alcohol  use Not on file     Comment: rarely  . Drug use: No  . Sexual activity: Not on file   Other Topics Concern  . Not on file   Social History Narrative  . No narrative on file  :  ROS: Eyes:See history of present illness Throat: No dysphagia  Neck: Chronic neck pain Resp:  No cough or dyspnea. No chest pain or palpitations Cardio: See above GI:No abdominal pain or change in bowel habit. Extremities: Recent left lower extremity DVT. Lymph nodes:  Neurologic:  See history of present illness Skin: . No rash or ecchymosis. Genitourinary: No urinary tract symptoms. No hematuria. Hematology: No bleeding problems.   Vitals: Vitals:   11/18/15 2000  BP: 130/90  Pulse: 68  Resp: 18  Temp: 99 F (37.2 C)    PHYSICAL EXAM: General appearance:Well-nourished Caucasian man who is alert and oriented 3 HEENT: Pharynx no erythema or exudate or mass. Teeth in good repair. Neck with full range of motion. Lymph Nodes: No cervical, supraclavicular, or axillary adenopathy Resp: Lungs clear to auscultation and resonant to percussion throughout Cardio: Regular cardiac rhythm without murmur gallop or rub Vascular: Carotids 2+ no bruits. Absent right radial  pulse. 2+ ulnar pulse. Ulnar and radial pulses 2+ on the left. Dorsalis pedis pulses 2+. Breasts: GI: Abdomen is soft and nontender without mass or organomegaly GU: Extremities: No edema. No calf tenderness. Neurologic: Alert and oriented 3. No facial asymmetry. Tongue is midline. Palate elevates symmetrically. Full extraocular movements. No nystagmus. Visual fields not tested. Pupils equal round and reactive to light. Optic discs sharp. Vessels normal. No hemorrhage or exudate. No Roth spots. Motor strength is 5 over 5 all extremities upper and lower. Upper body coordination is normal. Gait was normal on my office exam last week. Not tested tonight. Skin: No rash or ecchymosis. No Osler nodes. No Janeway spots. No split or  hemorrhages.  Labs:   Recent Labs  11/17/15 1020  WBC 5.0  HGB 15.1  HCT 42.5  PLT PLATELET CLUMPS NOTED ON SMEAR, COUNT APPEARS DECREASED   No results for input(s): NA, K, CL, CO2, GLUCOSE, BUN, CREATININE, CALCIUM in the last 72 hours.  Blood smear review: See office consult note. Platelets mildly decreased. Platelet clumping noted. No immature myeloid cells are lymphocytes. No schistocytes.  Images Studies/Results:  Mr Angiogram Head Wo Contrast  Result Date: 11/18/2015 CLINICAL DATA:  Dizziness, confusion, and memory loss with blurred vision over the last 3 months. Aortic valve vegetation and endocarditis. DVT. Cerebellar infarct. Anti phospholipid antibody syndrome. EXAM: MRI HEAD WITHOUT AND WITH CONTRAST MRA HEAD WITHOUT CONTRAST TECHNIQUE: Multiplanar, multiecho pulse sequences of the brain and surrounding structures were obtained without and with intravenous contrast. Angiographic images of the head were obtained using MRA technique without contrast. CONTRAST:  104m MULTIHANCE GADOBENATE DIMEGLUMINE 529 MG/ML IV SOLN COMPARISON:  MRI brain 10/06/2015. FINDINGS: MRI HEAD FINDINGS Brain: Previously noted punctate infarct in the posterior left cerebellum is near completely resolved on the DWI images. A new acute punctate cortical infarct is present in the anterior left frontal lobe on image 37 of series 3 in 27 of series 6. No other new or acute infarcts are present. T2 signal changes associated with the cerebellar infarct. Focal T2 cortical signal abnormality is present anteriorly in the right frontal lobe on image 19 of series 7 without associated diffusion abnormality. There is associated enhancement within this region of the right frontal lobe. No other pathologic enhancement is present. No other significant white matter disease is present. The basal ganglia are intact. The insular ribbon is normal. The brainstem and cerebellum are otherwise normal. The internal auditory canals are  within normal limits. Vascular: Flow is present in the major intracranial arteries. Skull and upper cervical spine: The skullbase is within normal limits. Midline sagittal structures are normal. The upper cervical spine is within normal limits. Sinuses/Orbits: The paranasal sinuses and mastoid air cells are clear. The globes and orbits are intact. MRA HEAD FINDINGS Internal carotid arteries are within normal limits from the high cervical segments through the ICA termini bilaterally. The A1 and M1 segments are normal. MCA bifurcations are within normal limits. The anterior communicating artery is patent. ACA and MCA branch vessels are within normal limits. The vertebral arteries are codominant. The left PICA origin is visualized and normal. The right PICA origin is not visualized. The basilar artery is within normal limits. The right posterior cerebral artery originates from the basilar tip. The left posterior cerebral artery is of fetal type with a small left P1 segment. IMPRESSION: 1. Interval evolution of punctate left cerebellar infarct. 2. Subacute cortical infarct in the anterior right frontal lobe with associated enhancement. The differential diagnosis would include  less likely in a septic emboli with focal encephalitis. There is no abscess formation. 3. Acute punctate cortical nonhemorrhagic infarct in the anterior left frontal lobe. 4. Infarcts of varying ages are compatible with a central embolic source in this patient with endocarditis. 5. Normal variant MRA circle of Willis without significant proximal stenosis, aneurysm, or branch vessel occlusion. Electronically Signed   By: San Morelle M.D.   On: 11/18/2015 15:04   Mr Jeri Cos WP Contrast  Result Date: 11/18/2015 CLINICAL DATA:  Dizziness, confusion, and memory loss with blurred vision over the last 3 months. Aortic valve vegetation and endocarditis. DVT. Cerebellar infarct. Anti phospholipid antibody syndrome. EXAM: MRI HEAD WITHOUT AND  WITH CONTRAST MRA HEAD WITHOUT CONTRAST TECHNIQUE: Multiplanar, multiecho pulse sequences of the brain and surrounding structures were obtained without and with intravenous contrast. Angiographic images of the head were obtained using MRA technique without contrast. CONTRAST:  6m MULTIHANCE GADOBENATE DIMEGLUMINE 529 MG/ML IV SOLN COMPARISON:  MRI brain 10/06/2015. FINDINGS: MRI HEAD FINDINGS Brain: Previously noted punctate infarct in the posterior left cerebellum is near completely resolved on the DWI images. A new acute punctate cortical infarct is present in the anterior left frontal lobe on image 37 of series 3 in 27 of series 6. No other new or acute infarcts are present. T2 signal changes associated with the cerebellar infarct. Focal T2 cortical signal abnormality is present anteriorly in the right frontal lobe on image 19 of series 7 without associated diffusion abnormality. There is associated enhancement within this region of the right frontal lobe. No other pathologic enhancement is present. No other significant white matter disease is present. The basal ganglia are intact. The insular ribbon is normal. The brainstem and cerebellum are otherwise normal. The internal auditory canals are within normal limits. Vascular: Flow is present in the major intracranial arteries. Skull and upper cervical spine: The skullbase is within normal limits. Midline sagittal structures are normal. The upper cervical spine is within normal limits. Sinuses/Orbits: The paranasal sinuses and mastoid air cells are clear. The globes and orbits are intact. MRA HEAD FINDINGS Internal carotid arteries are within normal limits from the high cervical segments through the ICA termini bilaterally. The A1 and M1 segments are normal. MCA bifurcations are within normal limits. The anterior communicating artery is patent. ACA and MCA branch vessels are within normal limits. The vertebral arteries are codominant. The left PICA origin is  visualized and normal. The right PICA origin is not visualized. The basilar artery is within normal limits. The right posterior cerebral artery originates from the basilar tip. The left posterior cerebral artery is of fetal type with a small left P1 segment. IMPRESSION: 1. Interval evolution of punctate left cerebellar infarct. 2. Subacute cortical infarct in the anterior right frontal lobe with associated enhancement. The differential diagnosis would include less likely in a septic emboli with focal encephalitis. There is no abscess formation. 3. Acute punctate cortical nonhemorrhagic infarct in the anterior left frontal lobe. 4. Infarcts of varying ages are compatible with a central embolic source in this patient with endocarditis. 5. Normal variant MRA circle of Willis without significant proximal stenosis, aneurysm, or branch vessel occlusion. Electronically Signed   By: CSan MorelleM.D.   On: 11/18/2015 15:04        Assessment: Active Problems:   Cerebellar infarct (H[facility])   Antiphospholipid antibody syndrome ([facility])   Aortic valve vegetation   Libman-Sacks endocarditis ([facility])   Acute cerebral infarction ([facility]   Impression: Complicated situation with likely  primary antiphospholipid antibody syndrome with a platelet/fibrin vegetation on the aortic valve. Initial cerebellar lacunar infarct while on Xarelto for a left lower extremity DVT which occurred 2 months prior to the neurologic symptoms. Now new bifrontal cerebral infarcts likely embolic which occurred while the patient was on aspirin, Lovenox 1.5 mg/kg daily, and Coumadin which was not yet therapeutic. No evidence of an ASD or PFO, significant carotid disease, or abnormalities in major cerebral vessels. Emboli likely coming from the aortic valve vegetation despite anticoagulation.  Recommendation: We had originally planned for  elective cardiac surgery to remove the vegetation scheduled for November 8. However in view of events over  the last 72 hours, we need to reconsider this plan and attempt surgery as soon as possible. I am advising full dose unfractionated heparin anticoagulation. Once heparin started then reverse Coumadin with 2 mg of IV vitamin K. Hold aspirin pending cardiovascular surgery evaluation. We will get survey blood cultures although I think the chance of this being a infectious endocarditis are extremely low. We will ask for neurology opinion. Cardiovascular surgery already involved and will see the patient.  Above plan discussed in detail with the patient and his wife. Wife expresses great concern that it is the blood thinners that were responsible for his stroke. She is concerned that the anticoagulants are allowing the vegetation to fragment. There is no way to know for sure, this is a unique situation with little guidance from the medical literature. It is my opinion that the risk of not continuing anticoagulation far exceeds the risk of keeping him on heparin until a decision is made on surgery. Prior to the events of the last 24 hours, I did discuss this patient's clinical situation with Dr. Johny Blamer, international expert on antiphospholipid antibody syndrome and coagulation expert at [facility]. He agreed with our initial plan of Lovenox/Coumadin then either observation for an interval of 4-6 weeks with repeat imaging or to proceed with resection of the vegetation. I communicated this to the patient and his wife. The patient has agreed with the plan outlined above.  Time spent on this consultation 3 hours with over 90 minutes with direct face-to-face contact with the patient and his wife.   Murriel Hopper, MD, [place]  Hematology-Oncology/Internal Medicine  11/18/2015, 8:24 PM

## 2015-11-18 NOTE — Progress Notes (Signed)
ANTICOAGULATION CONSULT NOTE - Initial Consult  Pharmacy Consult for Heparin Indication: stroke and antiphospholipid antibody syndrome  Allergies  Allergen Reactions  . Naproxen Anaphylaxis    Patient Measurements: Height: 5\' 8"  (172.7 cm) Weight: 190 lb 0.6 oz (86.2 kg) IBW/kg (Calculated) : 68.4 Heparin Dosing Weight: 86 kg  Vital Signs: Temp: 99 F (37.2 C) (10/18 2000) Temp Source: Oral (10/18 2000) BP: 130/90 (10/18 2000) Pulse Rate: 68 (10/18 2000)  Labs:  Recent Labs  11/17/15 1020  HGB 15.1  HCT 42.5  PLT PLATELET CLUMPS NOTED ON SMEAR, COUNT APPEARS DECREASED  LABPROT 15.5*  INR 1.22    Estimated Creatinine Clearance: 61.7 mL/min (by C-G formula based on SCr of 1.41 mg/dL (H)).   Medical History: Past Medical History:  Diagnosis Date  . Acute cerebral infarction ([facility]) 11/18/2015   Bifrontal embolic infarcts MRI/MRA 11/18/15; known aortic valve vegetation on lovenox/coumadin/ASA  . Antiphospholipid antibody syndrome ([facility]) 11/06/2015   11/04/15 significant elevation of IgG anticardiolipin & beta-2-GP-1 antibodies  . 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 DVT (deep venous thrombosis) ([facility])    left leg, on Xarelto  . 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 Thrombocytopenia [facility](HCC)    Assessment:  55 yr old male with complicated coagulation issues to begin IV heparin tonight.  Recently diagnosed antiphospholipid antibody syndrome, known vegetation on aortic valve, with recurrent cerebral emboli. 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   Has been on Lovenox 1.5 mg/kg (120 mg) daily since 10/13.  Coumadin 5 mg daily begun on 10/13, then increased to 7.5 mg daily on 10/17 when INR only 1.22.   Also taking Aspirin 81 mg daily. Thrombocytopenia. Platelets in clumps on 10/17 CBC, last value reported was 66 on 10/29/15. Dr. Cyndie ChimeGranfortuna estimated platelet count 100-200,000 after reviewing smear and getting rid of most of the clumps.  Patient reports no bleeding.    Patient reports last Lovenox injection this morning at ~0830, along with Coumadin 7.5 mg.  Admit labs to be drawn.  IV access to be obtained.  Discussed with Dr. Beckie Saltsivet, who discussed with Dr. Cyndie ChimeGranfortuna.  They would like to begin IV heparin tonight, rather than waiting 24 hrs from last Lovenox injection. Will target lower end of therapeutic heparin range for stroke.  Discussed with patient and wife.  Goal of Therapy:  Heparin level 0.3-0.5 units/ml  Monitor platelets by anticoagulation protocol: Yes   Plan:   Add baseline heparin level to admit labs, due to Lovenox dose this am.  Begin Heparin drip at 1200 units/hr (~14 units/kg/hr)  Heparin level ~ 6 hrs after drip begins.  Daily heparin level and CBC while on heparin.  Dennie FettersEgan, Nil Xiong Donovan, RPh Pager: [phone] 11/18/2015,10:51 PM

## 2015-11-18 NOTE — Progress Notes (Signed)
Pt admitted directly from home with stroke diagnosis, alert and oriented, denies any discomfort, pt settled in bed with tele monitor put and verified on pt, admitting Doc paged for orders, call light and family at bedside, will however continue to monitor. Obasogie-Asidi, Jeremias [place] Efe

## 2015-11-19 ENCOUNTER — Encounter (HOSPITAL_COMMUNITY): Payer: Self-pay | Admitting: *Deleted

## 2015-11-19 DIAGNOSIS — I359 Nonrheumatic aortic valve disorder, unspecified: Secondary | ICD-10-CM

## 2015-11-19 DIAGNOSIS — Z951 Presence of aortocoronary bypass graft: Secondary | ICD-10-CM | POA: Insufficient documentation

## 2015-11-19 DIAGNOSIS — N179 Acute kidney failure, unspecified: Secondary | ICD-10-CM

## 2015-11-19 DIAGNOSIS — Z886 Allergy status to analgesic agent status: Secondary | ICD-10-CM

## 2015-11-19 DIAGNOSIS — M25511 Pain in right shoulder: Secondary | ICD-10-CM

## 2015-11-19 DIAGNOSIS — I63119 Cerebral infarction due to embolism of unspecified vertebral artery: Secondary | ICD-10-CM | POA: Diagnosis present

## 2015-11-19 DIAGNOSIS — I639 Cerebral infarction, unspecified: Secondary | ICD-10-CM

## 2015-11-19 DIAGNOSIS — M542 Cervicalgia: Secondary | ICD-10-CM

## 2015-11-19 DIAGNOSIS — G8929 Other chronic pain: Secondary | ICD-10-CM

## 2015-11-19 DIAGNOSIS — R748 Abnormal levels of other serum enzymes: Secondary | ICD-10-CM

## 2015-11-19 DIAGNOSIS — Z86718 Personal history of other venous thrombosis and embolism: Secondary | ICD-10-CM

## 2015-11-19 LAB — CBC WITH DIFFERENTIAL/PLATELET
BASOS ABS: 0 10*3/uL (ref 0.0–0.1)
BASOS PCT: 0 %
EOS ABS: 0.2 10*3/uL (ref 0.0–0.7)
Eosinophils Relative: 4 %
HEMATOCRIT: 37.2 % — AB (ref 39.0–52.0)
HEMOGLOBIN: 13 g/dL (ref 13.0–17.0)
Lymphocytes Relative: 34 %
Lymphs Abs: 1.6 10*3/uL (ref 0.7–4.0)
MCH: 29.2 pg (ref 26.0–34.0)
MCHC: 34.9 g/dL (ref 30.0–36.0)
MCV: 83.6 fL (ref 78.0–100.0)
MONOS PCT: 6 %
Monocytes Absolute: 0.3 10*3/uL (ref 0.1–1.0)
NEUTROS ABS: 2.6 10*3/uL (ref 1.7–7.7)
NEUTROS PCT: 56 %
Platelets: 68 10*3/uL — ABNORMAL LOW (ref 150–400)
RBC: 4.45 MIL/uL (ref 4.22–5.81)
RDW: 12.9 % (ref 11.5–15.5)
WBC: 4.7 10*3/uL (ref 4.0–10.5)

## 2015-11-19 LAB — BASIC METABOLIC PANEL
Anion gap: 38 — ABNORMAL HIGH (ref 5–15)
BUN: 21 mg/dL — AB (ref 6–20)
CHLORIDE: 97 mmol/L — AB (ref 101–111)
CO2: 17 mmol/L — ABNORMAL LOW (ref 22–32)
Calcium: 6.2 mg/dL — CL (ref 8.9–10.3)
Creatinine, Ser: 1.05 mg/dL (ref 0.61–1.24)
GFR calc Af Amer: >60 mL/min (ref >60)
GFR calc non Af Amer: >60 mL/min (ref >60)
GLUCOSE: 108 mg/dL — AB (ref 65–99)
POTASSIUM: 3.8 mmol/L (ref 3.5–5.1)
SODIUM: 152 mmol/L — AB (ref 135–145)

## 2015-11-19 LAB — SEDIMENTATION RATE: SED RATE: 2 mm/h (ref 0–30)

## 2015-11-19 LAB — PROTIME-INR
INR: 2.56
INR: 1.39
INR: 1.7
PROTHROMBIN TIME: 20.2 s — AB (ref 11.4–15.2)
Prothrombin Time: 28 seconds — ABNORMAL HIGH (ref 11.4–15.2)
Prothrombin Time: 17.1 seconds — ABNORMAL HIGH (ref 11.4–15.2)

## 2015-11-19 LAB — COMPREHENSIVE METABOLIC PANEL
ALBUMIN: 4.1 g/dL (ref 3.5–5.0)
ALBUMIN: 4 g/dL (ref 3.5–5.0)
ALBUMIN: 4.1 g/dL (ref 3.5–5.0)
ALK PHOS: 44 U/L (ref 38–126)
ALK PHOS: 43 U/L (ref 38–126)
ALK PHOS: 44 U/L (ref 38–126)
ALT: 149 U/L — AB (ref 17–63)
ALT: 141 U/L — AB (ref 17–63)
ALT: 142 U/L — AB (ref 17–63)
ANION GAP: 10 (ref 5–15)
ANION GAP: 7 (ref 5–15)
AST: 84 U/L — AB (ref 15–41)
AST: 77 U/L — AB (ref 15–41)
AST: 95 U/L — AB (ref 15–41)
Anion gap: 7 (ref 5–15)
BILIRUBIN TOTAL: 0.9 mg/dL (ref 0.3–1.2)
BILIRUBIN TOTAL: 0.6 mg/dL (ref 0.3–1.2)
BILIRUBIN TOTAL: 0.8 mg/dL (ref 0.3–1.2)
BUN: 21 mg/dL — AB (ref 6–20)
BUN: 18 mg/dL (ref 6–20)
BUN: 23 mg/dL — AB (ref 6–20)
CALCIUM: 9.1 mg/dL (ref 8.9–10.3)
CALCIUM: 9.2 mg/dL (ref 8.9–10.3)
CALCIUM: 9.4 mg/dL (ref 8.9–10.3)
CO2: 26 mmol/L (ref 22–32)
CO2: 24 mmol/L (ref 22–32)
CO2: 26 mmol/L (ref 22–32)
CREATININE: 1.3 mg/dL — AB (ref 0.61–1.24)
CREATININE: 1.17 mg/dL (ref 0.61–1.24)
Chloride: 103 mmol/L (ref 101–111)
Chloride: 106 mmol/L (ref 101–111)
Chloride: 106 mmol/L (ref 101–111)
Creatinine, Ser: 1.32 mg/dL — ABNORMAL HIGH (ref 0.61–1.24)
GFR calc Af Amer: >60 mL/min (ref >60)
GFR calc Af Amer: >60 mL/min (ref >60)
GFR calc Af Amer: >60 mL/min (ref >60)
GFR calc non Af Amer: 58 mL/min — ABNORMAL LOW (ref >60)
GFR calc non Af Amer: >60 mL/min (ref >60)
GFR, EST NON AFRICAN AMERICAN: 59 mL/min — AB (ref >60)
GLUCOSE: 145 mg/dL — AB (ref 65–99)
GLUCOSE: 150 mg/dL — AB (ref 65–99)
GLUCOSE: 155 mg/dL — AB (ref 65–99)
Potassium: 3.5 mmol/L (ref 3.5–5.1)
Potassium: 3.8 mmol/L (ref 3.5–5.1)
Potassium: 4.6 mmol/L (ref 3.5–5.1)
SODIUM: 136 mmol/L (ref 135–145)
SODIUM: 140 mmol/L (ref 135–145)
Sodium: 139 mmol/L (ref 135–145)
TOTAL PROTEIN: 7.3 g/dL (ref 6.5–8.1)
TOTAL PROTEIN: 7.2 g/dL (ref 6.5–8.1)
TOTAL PROTEIN: 7.3 g/dL (ref 6.5–8.1)

## 2015-11-19 LAB — CBC
HEMATOCRIT: 38.8 % — AB (ref 39.0–52.0)
Hemoglobin: 13.4 g/dL (ref 13.0–17.0)
MCH: 29.2 pg (ref 26.0–34.0)
MCHC: 34.5 g/dL (ref 30.0–36.0)
MCV: 84.5 fL (ref 78.0–100.0)
PLATELETS: 65 10*3/uL — AB (ref 150–400)
RBC: 4.59 MIL/uL (ref 4.22–5.81)
RDW: 12.9 % (ref 11.5–15.5)
WBC: 4.7 10*3/uL (ref 4.0–10.5)

## 2015-11-19 LAB — HEPARIN LEVEL (UNFRACTIONATED)
HEPARIN UNFRACTIONATED: 0.27 [IU]/mL — AB (ref 0.30–0.70)
Heparin Unfractionated: 0.55 IU/mL (ref 0.30–0.70)
Heparin Unfractionated: 0.61 IU/mL (ref 0.30–0.70)
Heparin Unfractionated: 0.56 IU/mL (ref 0.30–0.70)

## 2015-11-19 LAB — IMMUNOFIXATION ELECTROPHORESIS
IgA/Immunoglobulin A, Serum: 207 mg/dL (ref 90–386)
IgG (Immunoglobin G), Serum: 1516 mg/dL (ref 700–1600)
IgM (Immunoglobulin M), Srm: 338 mg/dL — ABNORMAL HIGH (ref 20–172)
TOTAL PROTEIN: 8.4 g/dL (ref 6.0–8.5)

## 2015-11-19 LAB — ANTINUCLEAR ANTIBODIES, IFA: ANA TITER 1: NEGATIVE

## 2015-11-19 LAB — RHEUMATOID FACTOR

## 2015-11-19 LAB — APTT: APTT: 113 s — AB (ref 24–36)

## 2015-11-19 MED ORDER — CHLORHEXIDINE GLUCONATE 0.12 % MT SOLN: 15.0000 mL | Freq: Once | OROMUCOSAL | Status: DC

## 2015-11-19 MED ORDER — HYDROCODONE-ACETAMINOPHEN 5-325 MG PO TABS
1.0000 | ORAL_TABLET | Freq: Four times a day (QID) | ORAL | Status: DC | PRN
Start: 2015-11-19 — End: 2015-11-19
  Administered 2015-11-19: 2 via ORAL
  Filled 2015-11-19: qty 2

## 2015-11-19 MED ORDER — METOPROLOL TARTRATE 12.5 MG HALF TABLET
12.5000 mg | ORAL_TABLET | Freq: Once | ORAL | Status: AC
  Administered 2015-11-19: 12.5 mg via ORAL
  Filled 2015-11-19: qty 1

## 2015-11-19 MED ORDER — CHLORHEXIDINE GLUCONATE 4 % EX LIQD
30.0000 mL | CUTANEOUS | Status: DC
  Filled 2015-11-19: qty 30

## 2015-11-19 MED ORDER — HYDROCODONE-ACETAMINOPHEN 5-325 MG PO TABS
1.0000 | ORAL_TABLET | Freq: Once | ORAL | Status: AC
  Administered 2015-11-19: 1 via ORAL
  Filled 2015-11-19: qty 1

## 2015-11-19 MED ORDER — HYDROCODONE-ACETAMINOPHEN 5-325 MG PO TABS: 1.0000 | ORAL_TABLET | Freq: Every day | ORAL | Status: DC

## 2015-11-19 MED ORDER — ACETAMINOPHEN 325 MG PO TABS
650.0000 mg | ORAL_TABLET | Freq: Four times a day (QID) | ORAL | Status: DC | PRN
  Administered 2015-11-19: 650 mg via ORAL
  Filled 2015-11-19: qty 2

## 2015-11-19 MED ORDER — VITAMIN K1 10 MG/ML IJ SOLN
2.0000 mg | Freq: Once | INTRAVENOUS | Status: AC
  Administered 2015-11-19: 2 mg via INTRAVENOUS
  Filled 2015-11-19: qty 0.2

## 2015-11-19 NOTE — Progress Notes (Signed)
ANTICOAGULATION CONSULT NOTE - Follow-up Consult  Pharmacy Consult for Heparin Indication: stroke and antiphospholipid antibody syndrome  Allergies  Allergen Reactions  . Naproxen Anaphylaxis    Patient Measurements: Height: 5\' 8"  (172.7 cm) Weight: 190 lb 0.6 oz (86.2 kg) IBW/kg (Calculated) : 68.4 Heparin Dosing Weight: 86 kg  Vital Signs: Temp: 98.2 F (36.8 C) (10/19 2150) Temp Source: Oral (10/19 2150) BP: 121/80 (10/19 2150) Pulse Rate: 62 (10/19 2150)  Labs:  Recent Labs  11/17/15 1020 11/18/15 2348  11/19/15 0501 11/19/15 0728 11/19/15 1202 11/19/15 1940  HGB 15.1 13.0  --   --   --  13.4  --   HCT 42.5 37.2*  --   --   --  38.8*  --   PLT PLATELET CLUMPS NOTED ON SMEAR, COUNT APPEARS DECREASED 68*  --   --   --  65*  --   APTT  --  113*  --   --   --   --   --   LABPROT 15.5* 20.2*  --   --  28.0*  --  17.1*  INR 1.22 1.70  --   --  2.56  --  1.39  HEPARINUNFRC  --   --   < > 0.61  --  0.56 0.27*  CREATININE  --  1.32*  --   --  1.05 1.30*  --   < > = values in this interval not displayed.  Estimated Creatinine Clearance: 66.9 mL/min (by C-G formula based on SCr of 1.3 mg/dL (H)).   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 Pt is on IV heparin now, plan to have excision of arotic mass on 10/24  Heparin level 0.27 this evening slightly subtherapeutic after rate decreased to 950 units/hr. INR was 2.56 this morning, pt received 2 mg IV vitamin K, I rechecked INR this evening which is now 1.39.   Goal of Therapy:  Heparin level 0.3-0.5 units/ml  Monitor platelets by anticoagulation protocol: Yes   Plan:  Increase heparin rate slightly to 1000 units/hr F/u AM labs Daily heparin level and CBC Follow for any bleeding  Bayard HuggerMei Emonnie Cannady, PharmD, BCPS  Clinical Pharmacist  Pager: [phone]   11/19/2015  10:34 PM

## 2015-11-19 NOTE — Progress Notes (Signed)
Hematology: Stable overnight with no new neuro signs or symptoms. Neuro exam this AM remains stable with no focal deficits. Point tenderness C-5-6. INR was still subtherapeutic @ 1.7 yesterday post coumadin 5 mg x 4, 7.5 x 1 but up this AM @ 2.6. Coumadin & ASA stopped in anticipation of probable surgery to excise vegetation from aortic valve. On unfractionated heparin. I am not concerned with platelet count of 66,000: platelet counts have been spuriously low due to in vitro platelet clumping. ANA, RF negative. ESR 2 mm.  New transaminase elevations. Creatinine increased but stable at 1.3. Impression: 1. 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 LLE DVT - initially felt to be a provoked event but subsequent arterial event while on Xarelto 3. Initial left cerebellar embolic infarct 4. 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 No signs/sxs/lab evidence of infection felt secondary to APLS/Libman-Sachs endocarditis. No PFO/ASD 5. New neuro sxs w findings of new, bifrontal cerebral infarcts on 10/18 MRI while on lovenox, ASA, & coumadin Rec: See consult note: Cardiac Surg to see & determine timeline for resection of vegetation Continue therapeutic UF heparin despite platelet count - see above Hold ASA for now Consider renal US to R/O emboli to kidneys  Consider MRI C-spine to eval acute on chronic neck/right shoulder pain previously well controlled w celebrex

## 2015-11-19 NOTE — H&P (Signed)
Date: 11/19/2015               Patient Name:  Brian Lloyd MRN: [id number]  DOB: January 29, 1961 Age / Sex: 55 y.o., male   PCP: Lovenia Kim, PA-C         Medical Service: Internal Medicine Teaching Service         Attending Physician: Dr. Inez Catalina, MD    First Contact: Dr. Nelson Chimes Pager: [phone]  Second Contact: Dr. Earlene Plater Pager: [phone]       After Hours (After 5p/  First Contact Pager: [phone]  weekends / holidays): Second Contact Pager: [phone]   Chief Complaint: Sudden onset of vertigo and vision changes  History of Present Illness: Brian Lloyd is a 54 year old male with PMHx of DVT, CVA and antiphospholipid antibody syndrome who presents after completing an MRI and MRA today showing two new  infarctions, one in the left and another in the right frontal lobes.  He had stated that over the weekend he had experienced change in vision, vertigo and acute memory loss which prompted the current imaging.    He has been having several events with these symptoms since he was started on Xarelto in 07/17 for a DVT.  Two months prior he had a tendon rupture in his left calf.  These episodes of vertigo and vision changes started in 9/17.  He reported they can last from several minutes to an hour and occur spontaneously.    His first CVA was noted on 10/06/15.  MRI showed a small infarction in the left cerebellum.  Carotid dopplers showed no flow obstruction.  Echocardiogram with bubble study on 10/09/15 showed a medium sized mass on the aortic valve consistent with a vegetation and confirmed with a TEE.  No ASD or PFO on bubble study. 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    During the course of the last couple of months there were no signs of systemic infection.  However, Antiphospholipid antibodies were significantly elevated.  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  He was switched  from Xarelto to lovenox 1.5mg /kg daily, coumadin 5mg  daily and aspirin 81mg  daily on 11/13/15.  On 11/17/15 patient's INR showed that he was still sub-therapeutic.  When Dr. Cyndie Chime called to let him know to increase his coumadin dose the patient reported the symptoms he had over the weekend.  He was advised to come into the hospital but elected to wait until the following day.  Today after the MRI and MRA results returned patient was notified and agreed to come into the hospital.  Patient denied headaches, dysarthria, focal or generalized weakness, or dysphagia.   Meds:  Current Facility-Administered Medications for the 11/18/15 encounter [facility] Encounter)  Medication  . enoxaparin (LOVENOX) injection 120 mg   Current Meds  Medication Sig  . aspirin EC 81 MG tablet Take 1 tablet (81 mg total) by mouth daily. Patient states he has taken aspirin in the past without getting a reaction  . enoxaparin (LOVENOX) 120 MG/0.8ML injection Inject 0.8 mLs (120 mg total) into the skin daily.  . Multiple Vitamin (MULTIVITAMIN) tablet Take 1 tablet by mouth daily.  . psyllium (METAMUCIL) 58.6 % packet Take 1 packet by mouth daily.  Marland Kitchen warfarin (COUMADIN) 5 MG tablet Take 1 tablet (5 mg total) by mouth daily.     Allergies: Allergies as of 11/18/2015 - Review Complete 11/18/2015  Allergen Reaction Noted  .  Naproxen Anaphylaxis 07/20/2015   Past Medical History:  Diagnosis Date  . Acute cerebral infarction (HCC) 11/18/2015   Bifrontal embolic infarcts MRI/MRA 11/18/15; known aortic valve vegetation on lovenox/coumadin/ASA  . Antiphospholipid antibody syndrome (HCC) 11/06/2015   11/04/15 significant elevation of IgG anticardiolipin & beta-2-GP-1 antibodies  . 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 DVT (deep venous  thrombosis) (HCC)    left leg, on Xarelto  . 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 Thrombocytopenia (HCC)     Family History Father:  Heart disease and Hypertension Maternal Grandfather:  Heart disease and hypertension Paternal Grandfather:  Diabetes and Heart disease  Social History: Tobacco use: never smoker Alcohol use:  Rarely Illicit drug use:  Denies   Review of Systems: A complete ROS was negative except as per HPI.  Physical Exam: Blood pressure 123/84, pulse 67, temperature 99 F (37.2 C), temperature source Oral, resp. rate 18, height 5\' 8"  (1.727 m), weight 190 lb 0.6 oz (86.2 kg), SpO2 98 %. Vitals:   11/18/15 2000 11/18/15 2200 11/19/15 0000  BP: 130/90 123/84 129/80  Pulse: 68 67 70  Resp: 18 18 18   Temp: 99 F (37.2 C)    TempSrc: Oral    SpO2: 99% 98% 97%  Weight: 190 lb 0.6 oz (86.2 kg)    Height: 5\' 8"  (1.727 m)     General: Vital signs reviewed.  Patient is well-developed and well-nourished, in no acute distress and cooperative with exam.  Head: Normocephalic and atraumatic. Eyes: EOMI, conjunctivae normal, no scleral icterus.  Optic discs sharp and no hemorrhages noted.  Neck: Supple, trachea midline, normal ROM Cardiovascular: RRR, S1 normal, S2 normal, no murmurs, gallops, or rubs. Pulmonary/Chest: Clear to auscultation bilaterally, no wheezes, rales, or rhonchi. Abdominal: Soft, non-tender, non-distended, BS +, no masses, organomegaly, or guarding present.  Musculoskeletal: No joint deformities, erythema, or stiffness, ROM full and nontender. Extremities: No lower extremity edema bilaterally,  pulses symmetric and intact bilaterally. No cyanosis or clubbing. Neurological: A&O x3, Strength is normal and symmetric bilaterally, cranial nerve II-XII are grossly intact, no focal motor deficit, sensory intact to light touch bilaterally.  Skin: Warm, dry and intact. No  rashes or erythema. No osler nodes or janeway spots. Psychiatric: Normal mood and affect. speech and behavior is normal. Cognition and memory are normal.   CMP     Component Value Date/Time   NA 140 11/18/2015 2348   K 3.5 11/18/2015 2348   CL 106 11/18/2015 2348   CO2 24 11/18/2015 2348   GLUCOSE 155 (H) 11/18/2015 2348   BUN 23 (H) 11/18/2015 2348   CREATININE 1.32 (H) 11/18/2015 2348   CREATININE 1.27 10/26/2015 1114   CALCIUM 9.2 11/18/2015 2348   PROT 7.2 11/18/2015 2348   PROT 8.4 11/17/2015 1020   ALBUMIN 4.0 11/18/2015 2348   AST 95 (H) 11/18/2015 2348   ALT 142 (H) 11/18/2015 2348   ALKPHOS 44 11/18/2015 2348   BILITOT 0.6 11/18/2015 2348   GFRNONAA 58 (L) 11/18/2015 2348   GFRAA >60 11/18/2015 2348   CBC    Component Value Date/Time   WBC 4.7 11/18/2015 2348   RBC 4.45 11/18/2015 2348   HGB 13.0 11/18/2015 2348   HCT 37.2 (L)  11/18/2015 2348   PLT 68 (L) 11/18/2015 2348   MCV 83.6 11/18/2015 2348   MCH 29.2 11/18/2015 2348   MCHC 34.9 11/18/2015 2348   RDW 12.9 11/18/2015 2348   LYMPHSABS 1.6 11/18/2015 2348   MONOABS 0.3 11/18/2015 2348   EOSABS 0.2 11/18/2015 2348   BASOSABS 0.0 11/18/2015 2348     EKG: none  CXR: none  Assessment & Plan by Problem: [name] is a 55 year old with recurrent cerebral emboli with recently diagnosed antiphospholipid antibody with known vegetation on the aortic valve.  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  Libman-Sacks endocarditis is often associated with antiphospholipid antibody syndrome of which the patient  was recently diagnosed with.  Patient was planned for elective cardiac surgery to remove the vegetation on 12/09/15.  Because of the most recent events the recommendation was to have the surgery as soon as possible.  The recommendation was discussed with the patient and wife for over one hour.  They agreed to having the surgery moved up and remain on  anticoagulation up until surgery.  He will be started on unfractionated heparin.  Once heparin is started then recommend reversing coumadin with 2mg  of IV vitamin K.  We will hold aspirin.  Will obtain blood cultures although suspicion is low for infectious endocarditis.  Cardiology is already following. - Heparin per pharmacy consult - Cardiac monitoring - INR - Blood cultures - CMP - CBC w/diff  Acute cerebral infarction  His acute cerebral infarctions are likely embolic from his aortic valve vegetation.  His initial cerebellar lacunar infarct while on Xarelto for DVT of left lower extremity occurred in September 2017. Today new bifrontal cerebral infarcts occurred will patient was on aspirin, Lovenox 1.5mg /kg daily and Coumadin, not yet therapeutic.  No evidence of ASD or PFO or significant carotid disease or abnormalities in major cerebral vessels.  It is likely that the patient will need cardiothoracic surgery to help prevent future embolic strokes if anticoagulation alone is not beneficial. - Heparin per pharmacy consult - Cardiology following  Antiphospholipid antibody syndrome  Recently diagnosed this month. - Heparin per pharmacy consult  Acutely Elevated LFTs AST is 95 and ALT is 142.  Etiology is not clear.  Patients LFTs were normal on 11/04/15.  The start of aspirin could be the cause of the acute liver injury. - CMP in the morning   Dispo: Admit patient to Inpatient with expected length of stay greater than 2 midnights.  Signed: Camelia PhenesJessica Ratliff Cariah Salatino, DO 11/19/2015, 12:03 AM  Pager: [phone]

## 2015-11-19 NOTE — Consult Note (Signed)
[facility] for Infectious Disease  Total days of antibiotics 0               Reason for Consult: Endocarditis    Referring Physician: Dr. Daryll Drown  Principal Problem:   Acute cerebral infarction 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 ID was consulted for further recommendations regarding his endocarditis.   Over the course of the last several months, patient denies fever, chills, weight loss, chest pain, shortness of breath, cough, URI symptoms, nausea, vomiting, diarrhea, constipation, skin rashes, blood in his stool. He admits to chronic intermittent night sweats which have not changed.   Patient does admit to having 4 cats and frequent scratches. The most recent cat was rescued off of the street about one year ago. He denies other pets or exposures to other animals. Patient traveled to [place] about one year ago and denies any unusual symptoms. Patient went snorkeling in the [place] in [place] about one year ago as well and reports developing a severe upper respiratory infection at that time which resolved on its own.   Patient is up to date on his colon cancer screening. He had a colonoscopy at age 8 which he reports as "clean as a whistle" and did not need follow up for 10 years. He is  non-smoker and occasional drinker.   Past Medical History:  Diagnosis Date  . Acute cerebral infarction ([place]) 11/18/2015   Bifrontal embolic infarcts MRI/MRA 11/18/15; known aortic valve vegetation on lovenox/coumadin/ASA  . Antiphospholipid antibody syndrome ([place]) 11/06/2015   11/04/15 significant elevation of IgG anticardiolipin & beta-2-GP-1 antibodies  . 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 Cerebellar infarct ([place]) 10/06/2015   Cardiac MRI showed acute to subacute lacunar infarct in the left cerebellum  . DVT (deep venous thrombosis) (HCC)    left leg, on Xarelto  . DVT, lower extremity, distal, chronic ([place]) 08/2015   Left calf DVT following injury  . 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 Thrombocytopenia ([place])     Allergies:  Allergies  Allergen Reactions  . Naproxen Anaphylaxis    Current antibiotics: None  MEDICATIONS: . sodium chloride flush  3 mL Intravenous Q12H    Social History  Substance Use Topics  . Smoking status: Never Smoker  . Smokeless tobacco: Never Used  . Alcohol use No     Comment: rarely    Family History  Problem Relation Age of Onset  . Heart disease Father   . Hypertension Father   . Heart disease Maternal Grandfather   . Hypertension Maternal Grandfather   . Diabetes Paternal Grandfather   . Heart disease Paternal Grandfather    Review of Systems: A complete ROS was negative except as per HPI.   OBJECTIVE:  Vitals:   11/19/15 0400 11/19/15 0600 11/19/15 0800 11/19/15 1334  BP: 119/68 124/73 118/66 108/67  Pulse: 79 (!) 59 77 (!) 58  Resp: 18 18 18 18   Temp:  98.2 F (36.8 C) 98 F (36.7 C) 97.9 F (36.6 C)  TempSrc:  Oral Oral Oral  SpO2: 97% 99% 100% 99%  Weight:      Height:       General: Vital signs reviewed.  Patient is well-developed and well-nourished, in no acute distress and cooperative with exam.  Head: Normocephalic and atraumatic. Eyes: PERRLA,  conjunctivae normal, no scleral icterus.  Neck: Supple, trachea midline, no anterior or posterior cervical lymphadenopathy. No supraclavicular adenopathy. Mouth: Normal oropharynx, no signs of thrush.  Cardiovascular: RRR, S1 normal, S2 normal, no murmurs, gallops, or rubs. Pulmonary/Chest: Clear to auscultation bilaterally, no wheezes, rales, or rhonchi. Abdominal: Soft, non-tender, non-distended, BS + Extremities: No lower extremity edema bilaterally, pulses symmetric and intact bilaterally. No left calf tenderness. Neurological: A&O x3, Strength is normal and symmetric bilaterally, cranial nerve II-XII are grossly intact, no focal motor deficit, sensory intact to light touch bilaterally.  Skin: Warm, dry and intact. No rashes or erythema. No evidence of thromboembolic phenomena such as janeway lesions, splinter hemorrhages or osler nodes. Psychiatric: Normal mood and affect. speech and behavior is normal. Cognition and memory are normal.   LABS: Results for orders placed or performed during the hospital encounter of 11/18/15 (from the past 48 hour(s))  Protime-INR     Status: Abnormal   Collection Time: 11/18/15 11:48 PM  Result Value Ref Range   Prothrombin Time 20.2 (H) 11.4 - 15.2 seconds   INR 1.70   APTT     Status: Abnormal   Collection Time: 11/18/15 11:48 PM  Result Value Ref Range   aPTT 113 (H) 24 - 36 seconds    Comment:        IF BASELINE aPTT IS ELEVATED, SUGGEST PATIENT RISK ASSESSMENT BE USED TO DETERMINE APPROPRIATE ANTICOAGULANT THERAPY.   CBC WITH DIFFERENTIAL     Status: Abnormal   Collection Time: 11/18/15 11:48 PM  Result Value Ref Range   WBC 4.7 4.0 - 10.5 K/uL   RBC 4.45 4.22 - 5.81 MIL/uL   Hemoglobin 13.0 13.0 - 17.0 g/dL   HCT 37.2 (L) 39.0 - 52.0 %   MCV 83.6 78.0 - 100.0 fL   MCH 29.2 26.0 - 34.0 pg   MCHC 34.9 30.0 - 36.0 g/dL   RDW 12.9 11.5 - 15.5 %   Platelets 68 (L) 150 - 400 K/uL    Comment: CONSISTENT WITH PREVIOUS RESULT   Neutrophils  Relative % 56 %   Neutro Abs 2.6 1.7 - 7.7 K/uL   Lymphocytes Relative 34 %   Lymphs Abs 1.6 0.7 - 4.0 K/uL   Monocytes Relative 6 %   Monocytes Absolute 0.3 0.1 - 1.0 K/uL   Eosinophils Relative 4 %   Eosinophils Absolute 0.2 0.0 - 0.7 K/uL   Basophils Relative 0 %   Basophils Absolute 0.0 0.0 - 0.1 K/uL  Comprehensive metabolic panel     Status: Abnormal   Collection Time: 11/18/15 11:48 PM  Result Value Ref Range   Sodium 140 135 - 145 mmol/L   Potassium 3.5 3.5 - 5.1 mmol/L   Chloride 106 101 - 111 mmol/L   CO2 24 22 - 32 mmol/L   Glucose, Bld 155 (H) 65 - 99 mg/dL   BUN 23 (H) 6 - 20 mg/dL   Creatinine,  Ser 1.32 (H) 0.61 - 1.24 mg/dL   Calcium 9.2 8.9 - 10.3 mg/dL   Total Protein 7.2 6.5 - 8.1 g/dL   Albumin 4.0 3.5 - 5.0 g/dL   AST 95 (H) 15 - 41 U/L   ALT 142 (H) 17 - 63 U/L   Alkaline Phosphatase 44 38 - 126 U/L   Total Bilirubin 0.6 0.3 - 1.2 mg/dL   GFR calc non Af Amer 58 (L) >60 mL/min   GFR calc Af Amer >60 >60 mL/min    Comment: (NOTE) The eGFR has been calculated using the CKD EPI equation. This calculation has not been validated in all clinical situations. eGFR's persistently <60 mL/min signify possible Chronic Kidney Disease.    Anion gap 10 5 - 15  Heparin level (unfractionated)     Status: None   Collection Time: 11/18/15 11:49 PM  Result Value Ref Range   Heparin Unfractionated 0.55 0.30 - 0.70 IU/mL    Comment:        IF HEPARIN RESULTS ARE BELOW EXPECTED VALUES, AND PATIENT DOSAGE HAS BEEN CONFIRMED, SUGGEST FOLLOW UP TESTING OF ANTITHROMBIN III LEVELS.   Heparin level (unfractionated)     Status: None   Collection Time: 11/19/15  5:01 AM  Result Value Ref Range   Heparin Unfractionated 0.61 0.30 - 0.70 IU/mL    Comment:        IF HEPARIN RESULTS ARE BELOW EXPECTED VALUES, AND PATIENT DOSAGE HAS BEEN CONFIRMED, SUGGEST FOLLOW UP TESTING OF ANTITHROMBIN III LEVELS.   Basic metabolic panel     Status: Abnormal   Collection Time: 11/19/15   7:28 AM  Result Value Ref Range   Sodium 152 (H) 135 - 145 mmol/L    Comment: DELTA CHECK NOTED   Potassium 3.8 3.5 - 5.1 mmol/L   Chloride 97 (L) 101 - 111 mmol/L   CO2 17 (L) 22 - 32 mmol/L   Glucose, Bld 108 (H) 65 - 99 mg/dL   BUN 21 (H) 6 - 20 mg/dL   Creatinine, Ser 1.05 0.61 - 1.24 mg/dL   Calcium 6.2 (LL) 8.9 - 10.3 mg/dL    Comment: DELTA CHECK NOTED CRITICAL RESULT CALLED TO, READ BACK BY AND VERIFIED WITH: D.JOHNSON,RN 11/19/15 @0903  BY V.WILKINS    GFR calc non Af Amer >60 >60 mL/min   GFR calc Af Amer >60 >60 mL/min    Comment: (NOTE) The eGFR has been calculated using the CKD EPI equation. This calculation has not been validated in all clinical situations. eGFR's persistently <60 mL/min signify possible Chronic Kidney Disease.    Anion gap 38 (H) 5 - 15  Protime-INR     Status: Abnormal   Collection Time: 11/19/15  7:28 AM  Result Value Ref Range   Prothrombin Time 28.0 (H) 11.4 - 15.2 seconds   INR 2.56   Heparin level (unfractionated)     Status: None   Collection Time: 11/19/15 12:02 PM  Result Value Ref Range   Heparin Unfractionated 0.56 0.30 - 0.70 IU/mL    Comment:        IF HEPARIN RESULTS ARE BELOW EXPECTED VALUES, AND PATIENT DOSAGE HAS BEEN CONFIRMED, SUGGEST FOLLOW UP TESTING OF ANTITHROMBIN III LEVELS.   Comprehensive metabolic panel     Status: Abnormal   Collection Time: 11/19/15 12:02 PM  Result Value Ref Range   Sodium 139 135 - 145 mmol/L    Comment: DELTA CHECK NOTED   Potassium 4.6 3.5 - 5.1 mmol/L    Comment: DELTA CHECK  NOTED   Chloride 106 101 - 111 mmol/L   CO2 26 22 - 32 mmol/L   Glucose, Bld 145 (H) 65 - 99 mg/dL   BUN 21 (H) 6 - 20 mg/dL   Creatinine, Ser 1.30 (H) 0.61 - 1.24 mg/dL   Calcium 9.1 8.9 - 10.3 mg/dL    Comment: DELTA CHECK NOTED   Total Protein 7.3 6.5 - 8.1 g/dL   Albumin 4.1 3.5 - 5.0 g/dL   AST 84 (H) 15 - 41 U/L   ALT 149 (H) 17 - 63 U/L   Alkaline Phosphatase 44 38 - 126 U/L   Total Bilirubin 0.9 0.3  - 1.2 mg/dL   GFR calc non Af Amer 59 (L) >60 mL/min   GFR calc Af Amer >60 >60 mL/min    Comment: (NOTE) The eGFR has been calculated using the CKD EPI equation. This calculation has not been validated in all clinical situations. eGFR's persistently <60 mL/min signify possible Chronic Kidney Disease.    Anion gap 7 5 - 15  CBC     Status: Abnormal   Collection Time: 11/19/15 12:02 PM  Result Value Ref Range   WBC 4.7 4.0 - 10.5 K/uL   RBC 4.59 4.22 - 5.81 MIL/uL   Hemoglobin 13.4 13.0 - 17.0 g/dL   HCT 38.8 (L) 39.0 - 52.0 %   MCV 84.5 78.0 - 100.0 fL   MCH 29.2 26.0 - 34.0 pg   MCHC 34.5 30.0 - 36.0 g/dL   RDW 12.9 11.5 - 15.5 %   Platelets 65 (L) 150 - 400 K/uL    Comment: REPEATED TO VERIFY CONSISTENT WITH PREVIOUS RESULT     MICRO: BCx 9/15 >> NGTD BCx 10/18 >> pending  IMAGING: Mr Angiogram Head Wo Contrast  Result Date: 11/18/2015 CLINICAL DATA:  Dizziness, confusion, and memory loss with blurred vision over the last 3 months. Aortic valve vegetation and endocarditis. DVT. Cerebellar infarct. Anti phospholipid antibody syndrome. EXAM: MRI HEAD WITHOUT AND WITH CONTRAST MRA HEAD WITHOUT CONTRAST TECHNIQUE: Multiplanar, multiecho pulse sequences of the brain and surrounding structures were obtained without and with intravenous contrast. Angiographic images of the head were obtained using MRA technique without contrast. CONTRAST:  8m MULTIHANCE GADOBENATE DIMEGLUMINE 529 MG/ML IV SOLN COMPARISON:  MRI brain 10/06/2015. FINDINGS: MRI HEAD FINDINGS Brain: Previously noted punctate infarct in the posterior left cerebellum is near completely resolved on the DWI images. A new acute punctate cortical infarct is present in the anterior left frontal lobe on image 37 of series 3 in 27 of series 6. No other new or acute infarcts are present. T2 signal changes associated with the cerebellar infarct. Focal T2 cortical signal abnormality is present anteriorly in the right frontal lobe on  image 19 of series 7 without associated diffusion abnormality. There is associated enhancement within this region of the right frontal lobe. No other pathologic enhancement is present. No other significant white matter disease is present. The basal ganglia are intact. The insular ribbon is normal. The brainstem and cerebellum are otherwise normal. The internal auditory canals are within normal limits. Vascular: Flow is present in the major intracranial arteries. Skull and upper cervical spine: The skullbase is within normal limits. Midline sagittal structures are normal. The upper cervical spine is within normal limits. Sinuses/Orbits: The paranasal sinuses and mastoid air cells are clear. The globes and orbits are intact. MRA HEAD FINDINGS Internal carotid arteries are within normal limits from the high cervical segments through the ICA termini bilaterally. The A1  and M1 segments are normal. MCA bifurcations are within normal limits. The anterior communicating artery is patent. ACA and MCA branch vessels are within normal limits. The vertebral arteries are codominant. The left PICA origin is visualized and normal. The right PICA origin is not visualized. The basilar artery is within normal limits. The right posterior cerebral artery originates from the basilar tip. The left posterior cerebral artery is of fetal type with a small left P1 segment. IMPRESSION: 1. Interval evolution of punctate left cerebellar infarct. 2. Subacute cortical infarct in the anterior right frontal lobe with associated enhancement. The differential diagnosis would include less likely in a septic emboli with focal encephalitis. There is no abscess formation. 3. Acute punctate cortical nonhemorrhagic infarct in the anterior left frontal lobe. 4. Infarcts of varying ages are compatible with a central embolic source in this patient with endocarditis. 5. Normal variant MRA circle of Willis without significant proximal stenosis, aneurysm, or  branch vessel occlusion. Electronically Signed   By: San Morelle M.D.   On: 11/18/2015 15:04   Mr Jeri Cos KL Contrast  Result Date: 11/18/2015 CLINICAL DATA:  Dizziness, confusion, and memory loss with blurred vision over the last 3 months. Aortic valve vegetation and endocarditis. DVT. Cerebellar infarct. Anti phospholipid antibody syndrome. EXAM: MRI HEAD WITHOUT AND WITH CONTRAST MRA HEAD WITHOUT CONTRAST TECHNIQUE: Multiplanar, multiecho pulse sequences of the brain and surrounding structures were obtained without and with intravenous contrast. Angiographic images of the head were obtained using MRA technique without contrast. CONTRAST:  71m MULTIHANCE GADOBENATE DIMEGLUMINE 529 MG/ML IV SOLN COMPARISON:  MRI brain 10/06/2015. FINDINGS: MRI HEAD FINDINGS Brain: Previously noted punctate infarct in the posterior left cerebellum is near completely resolved on the DWI images. A new acute punctate cortical infarct is present in the anterior left frontal lobe on image 37 of series 3 in 27 of series 6. No other new or acute infarcts are present. T2 signal changes associated with the cerebellar infarct. Focal T2 cortical signal abnormality is present anteriorly in the right frontal lobe on image 19 of series 7 without associated diffusion abnormality. There is associated enhancement within this region of the right frontal lobe. No other pathologic enhancement is present. No other significant white matter disease is present. The basal ganglia are intact. The insular ribbon is normal. The brainstem and cerebellum are otherwise normal. The internal auditory canals are within normal limits. Vascular: Flow is present in the major intracranial arteries. Skull and upper cervical spine: The skullbase is within normal limits. Midline sagittal structures are normal. The upper cervical spine is within normal limits. Sinuses/Orbits: The paranasal sinuses and mastoid air cells are clear. The globes and orbits are  intact. MRA HEAD FINDINGS Internal carotid arteries are within normal limits from the high cervical segments through the ICA termini bilaterally. The A1 and M1 segments are normal. MCA bifurcations are within normal limits. The anterior communicating artery is patent. ACA and MCA branch vessels are within normal limits. The vertebral arteries are codominant. The left PICA origin is visualized and normal. The right PICA origin is not visualized. The basilar artery is within normal limits. The right posterior cerebral artery originates from the basilar tip. The left posterior cerebral artery is of fetal type with a small left P1 segment. IMPRESSION: 1. Interval evolution of punctate left cerebellar infarct. 2. Subacute cortical infarct in the anterior right frontal lobe with associated enhancement. The differential diagnosis would include less likely in a septic emboli with focal encephalitis. There is no  abscess formation. 3. Acute punctate cortical nonhemorrhagic infarct in the anterior left frontal lobe. 4. Infarcts of varying ages are compatible with a central embolic source in this patient with endocarditis. 5. Normal variant MRA circle of Willis without significant proximal stenosis, aneurysm, or branch vessel occlusion. Electronically Signed   By: San Morelle M.D.   On: 11/18/2015 15:04   HISTORICAL MICRO/IMAGING  Assessment/Plan:   Mr. Brass is a 47 M with recent diagnosis of left plantaris rupture in May 2017, followed by a presumed provoked left DVT in July 2017 treated with Xarelto, Embolic CVA in September 2017 with subsequent diagnosis of a 1.4 x 0.6 cm vegetation on the aortic valve and a new diagnosis of Antiphospholipid Antibody Syndrome (elevated Beta-2 glycoprotein 1) who presented to the ED on 10/18 after his follow up MRI and MRA revealed new acute infarctions associated with diplopia and vertigo. ID was consulted for further recommendations regarding his endocarditis.    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 Regarding an infectious etiology, patient has been afebrile and without leukocytosis since diagnosis on October 16, 2015. He reports no infectious symptoms and no evidence of thromboembolic phenomena on his skin; however, patient has had recurrent embolic strokes likely due to his large vegetation on his aortic valve. BCx from September are NGTD. Repeat BCx are pending. Culture negative infective endocarditis is a possibility (albeit he does not display infectious symptoms); however, he does have exposure to cats (Bartonella). Otherwise, he does not have significant other risk factors for culture negative IE including homelessness, alcoholism, travel to the [place], recent antibiotic use or exposure to farm animals (Brucella, Coxiella, Legionella). The most common causes of culture negative IE are zoonotic and fungi. The HACEK organisms should be easy to isolate on culture. Non-bacterial thrombotic endocarditis is most commonly seen in patients with advanced malignancy or in SLE; however, patient is up to date on his colon cancer screening, never-smoker and there are no obvious symptoms pointing towards another source of malignancy or SLE. Given his diagnosis of antiphospholipid antibody syndrome, most likely diagnosis is Libman-Sacks Endocarditis. However, to be prudent we will complete work up for IE with repeat blood cultures and Bartonella testing.  -BCx 10/18>> pending -Needs repeat BCx (2 more for a total of 3) -Repeat CBC/CMET tomorrow am (will try to coordinate blood draws) -Check Bartonella antibody, Hepatitis C antibody and HIV -Continue anticoagulation per primary and hematology -Repeat TTE -CVTS to see for possible surgical intervention  Elevated Liver Enzymes: With acute mild to moderate elevations of AST and ALT, previously normal, would favor medication effect versus embolic phenomena  (however, would expect to be higher).  -Caution with hydrocodone-acetaminophen--> would consider switching to oxycodone only or NSAIDs -Hepatitis C antibody -HIV  Martyn Malay, DO PGY-3 Internal Medicine Resident Pager # [phone] 11/19/2015 5:28 PM

## 2015-11-19 NOTE — Progress Notes (Signed)
ANTICOAGULATION CONSULT NOTE - Follow-up Consult  Pharmacy Consult for Heparin Indication: stroke and antiphospholipid antibody syndrome  Allergies  Allergen Reactions  . Naproxen Anaphylaxis    Patient Measurements: Height: 5\' 8"  (172.7 cm) Weight: 190 lb 0.6 oz (86.2 kg) IBW/kg (Calculated) : 68.4 Heparin Dosing Weight: 86 kg  Vital Signs: Temp: 98.2 F (36.8 C) (10/19 0600) Temp Source: Oral (10/19 0600) BP: 124/73 (10/19 0600) Pulse Rate: 59 (10/19 0600)  Labs:  Recent Labs  11/17/15 1020 11/18/15 2348 11/18/15 2349 11/19/15 0501 11/19/15 0728  HGB 15.1 13.0  --   --   --   HCT 42.5 37.2*  --   --   --   PLT PLATELET CLUMPS NOTED ON SMEAR, COUNT APPEARS DECREASED 68*  --   --   --   APTT  --  113*  --   --   --   LABPROT 15.5* 20.2*  --   --  28.0*  INR 1.22 1.70  --   --  2.56  HEPARINUNFRC  --   --  0.55 0.61  --   CREATININE  --  1.32*  --   --  1.05    Estimated Creatinine Clearance: 82.9 mL/min (by C-G formula based on SCr of 1.05 mg/dL).   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 Pt has been on Lovenox 1.5 mg/kg daily + Coumadin 5 mg daily since 10/13, then increased to 7.5 mg daily on 10/17 when INR only 1.22. Thrombocytopenia. Hgb wnl, plt 68 (low but stable over past month or so, [place] not concerned per note).    Heparin level slightly supratherapeutic for lower goal (0.56) after rate decrease. No bleed reported.  Goal of Therapy:  Heparin level 0.3-0.5 units/ml  Monitor platelets by anticoagulation protocol: Yes   Plan:  Decrease heparin to 950 units/h 6hr heparin level Daily heparin level and CBC Follow for any bleeding Surgery for 11/8, may move up  Babs BertinHaley Sriya Kroeze, PharmD, BCPS Clinical Pharmacist 11/19/2015 9:46 AM

## 2015-11-19 NOTE — Progress Notes (Signed)
CRITICAL VALUE ALERT  Critical value received: 6.2  Date of notification:  11/19/15  Time of notification:  0904  Critical value read back:Yes.    Nurse who received alert:  Bard Herbertaphne, RN  MD notified (1st page):  yes  Time of first page:  0904  MD notified (2nd page):  Time of second page:  Responding MD:  Dr. Nelson ChimesAmin  Time MD responded:  [phone]

## 2015-11-19 NOTE — Progress Notes (Signed)
   Subjective: Patient was feeling better this morning. He states that he did not had any more episode of blurry vision or transient loss of memory since he was in hospital. He had one episode yesterday before coming to the hospital.  Objective:  Vital signs in last 24 hours: Vitals:   11/19/15 0400 11/19/15 0600 11/19/15 0800 11/19/15 1334  BP: 119/68 124/73 118/66 108/67  Pulse: 79 (!) 59 77 (!) 58  Resp: 18 18 18 18   Temp:  98.2 F (36.8 C) 98 F (36.7 C) 97.9 F (36.6 C)  TempSrc:  Oral Oral Oral  SpO2: 97% 99% 100% 99%  Weight:      Height:       Gen. well-built, well-nourished man, sitting comfortably in a chair, alert and oriented. Lungs. Clear bilaterally CV. Regular rate and rhythm, no murmur rub or gallop. Abdomen. Soft, nontender, nondistended, bowel sounds positive. Neuro. Strength and sensations are normal and equal bilaterally. No focal deficit noted. Extremities. No edema, no cyanosis, pulses 2+ bilaterally  Assessment/Plan:  [name] is a 55 year old with recurrent cerebral emboli with recently diagnosed antiphospholipid antibody with known vegetation on the aortic valve.  Libman-Sacks endocarditis. He is on heparin infusion. Only definitive treatment is surgery to remove those vegetations. He has been seen by Dr. Barry Dieneswens as an outpatient, and has a scheduled surgery for November 9 initially. I was told by Dr. Barry Dieneswens office that he is going to evaluate him tonight after OR, and they will schedule him for surgery either on October 24 or 27th. -Blood culture results are not back yet.  The current embolic stroke. He is getting the current embolic stroke from his aortic valve vegetations. He is on heparin infusion with therapeutic INR. He needs a surgery for definitive management. -Monitor for any focal deficits symptoms suggested stroke.  Abnormal liver function. Not sure about the cause, as his liver functions were normal on October 4. Might be due to  this embolic phenomena affecting his liver. -We will monitor liver functions.  Dispo: Anticipated discharge in approximately 2-3 day(s).   Arnetha CourserSumayya Albirda Shiel, MD 11/19/2015, 3:47 PM Pager: [phone]

## 2015-11-19 NOTE — Progress Notes (Signed)
ANTICOAGULATION CONSULT NOTE - Follow-up Consult  Pharmacy Consult for Heparin Indication: stroke and antiphospholipid antibody syndrome  Allergies  Allergen Reactions  . Naproxen Anaphylaxis    Patient Measurements: Height: 5\' 8"  (172.7 cm) Weight: 190 lb 0.6 oz (86.2 kg) IBW/kg (Calculated) : 68.4 Heparin Dosing Weight: 86 kg  Vital Signs: Temp: 99 F (37.2 C) (10/18 2000) Temp Source: Oral (10/18 2000) BP: 119/68 (10/19 0400) Pulse Rate: 79 (10/19 0400)  Labs:  Recent Labs  11/17/15 1020 11/18/15 2348 11/18/15 2349 11/19/15 0501  HGB 15.1 13.0  --   --   HCT 42.5 37.2*  --   --   PLT PLATELET CLUMPS NOTED ON SMEAR, COUNT APPEARS DECREASED 68*  --   --   APTT  --  113*  --   --   LABPROT 15.5* 20.2*  --   --   INR 1.22 1.70  --   --   HEPARINUNFRC  --   --  0.55 0.61  CREATININE  --  1.32*  --   --     Estimated Creatinine Clearance: 65.9 mL/min (by C-G formula based on SCr of 1.32 mg/dL (H)).   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 Pt has been on Lovenox 1.5 mg/kg (120 mg) daily since 10/13.  Coumadin 5 mg daily begun on 10/13, then increased to 7.5 mg daily on 10/17 when INR only 1.22.  Also taking Aspirin 81 mg daily. Thrombocytopenia. Hgb 13, plt 68 (low but stable over past month or so)    Patient reports last Lovenox injection 10/18~0830, along with Coumadin 7.5 mg. Baseline heparin level 0.55 which is therapeutic - likely due to Lovenox dose from 10/18. Heparin level 0.61 (slightly supratherapeutic for low goal level of 0.3-0.5) on gtt at 1200 units/hr.  Goal of Therapy:  Heparin level 0.3-0.5 units/ml  Monitor platelets by anticoagulation protocol: Yes   Plan:  Decrease heparin drip to 1050 units/hr  F/u 6 hr heparin level  Christoper Fabianaron Walter Min, PharmD, BCPS Clinical pharmacist, pager  [phone] 11/19/2015,5:55 AM

## 2015-11-19 NOTE — Progress Notes (Signed)
      [address]       [name] [id number]             [phone]     CARDIOTHORACIC SURGERY PROGRESS NOTE  Subjective: Feels fine at present, but today was a stressful day for the patient and his family.  Details of brief neurologic events discussed.    Objective: Vital signs in last 24 hours: Temp:  [97.8 F (36.6 C)-98.2 F (36.8 C)] 97.8 F (36.6 C) (10/19 1729) Pulse Rate:  [58-79] 60 (10/19 1729) Cardiac Rhythm: Normal sinus rhythm (10/19 1930) Resp:  [16-18] 18 (10/19 1729) BP: (108-130)/(66-84) 130/80 (10/19 1729) SpO2:  [97 %-100 %] 99 % (10/19 1729)  Physical Exam:  Rhythm:   sinus  Breath sounds: clear  Heart sounds:  RRR w/out murmur  Incisions:  n/a  Abdomen:  soft  Extremities:  warm   Intake/Output from previous day: 10/18 0701 - 10/19 0700 In: 59.6 [I.V.:59.6] Out: -  Intake/Output this shift: No intake/output data recorded.  Lab Results:  Recent Labs  11/18/15 2348 11/19/15 1202  WBC 4.7 4.7  HGB 13.0 13.4  HCT 37.2* 38.8*  PLT 68* 65*   BMET:  Recent Labs  11/19/15 0728 11/19/15 1202  NA 152* 139  K 3.8 4.6  CL 97* 106  CO2 17* 26  GLUCOSE 108* 145*  BUN 21* 21*  CREATININE 1.05 1.30*  CALCIUM 6.2* 9.1    CBG (last 3)  No results for input(s): GLUCAP in the last 72 hours. PT/INR:   Recent Labs  11/19/15 0728  LABPROT 28.0*  INR 2.56    CXR:  N/A  Assessment/Plan:   Patient is well known to me from recent outpatient consultation.  He was last seen in our office on 11/11/2015 at which time we made tentative plans for surgery.  Given recurrent embolic events it makes sense to proceed sooner rather than later.    We tentatively plan for OR on Tuesday 11/24/2015 presuming that his INR has normalized and the patient remains clinically stable.  Will follow.  I spent in excess of 15 minutes during the conduct of this hospital encounter and >50% of this time involved direct face-to-face encounter with the  patient for counseling and/or coordination of their care.   Purcell Nailslarence H Jerret Mcbane, MD 11/19/2015 9:13 PM

## 2015-11-19 NOTE — Care Management Note (Signed)
Case Management Note  Patient Details  Name: Nolene EbbsWarren W Jarmon MRN: [id number] Date of Birth: 1960-12-27  Subjective/Objective:   Pt admitted with CVA. He is from home with his spouse. On lovenox/ coumadin for antiphospholipid syndrome. 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               Action/Plan: On IV heparin for possible surgery. CM following for discharge needs.   Expected Discharge Date:                  Expected Discharge Plan:     In-House Referral:     Discharge planning Services     Post Acute Care Choice:    Choice offered to:     DME Arranged:    DME Agency:     HH Arranged:    HH Agency:     Status of Service:  In process, will continue to follow  If discussed at Long Length of Stay Meetings, dates discussed:    Additional Comments:  Kermit BaloKelli F Izabel Chim, RN 11/19/2015, 10:36 AM

## 2015-11-19 NOTE — Progress Notes (Signed)
Paged by the nurse about headache.  We went to evaluate the patient and he states he gets headaches twice a month that starts in his neck and travels up to his head and becomes diffuse.  He states this is similar to his monthly headaches.  Neuro exam showed CN II-XII intact.  Denies vision changes, focal or generalized weakness, dysarthria or dysphagia.  He asked for tylenol and it was ordered.

## 2015-11-20 ENCOUNTER — Inpatient Hospital Stay (HOSPITAL_COMMUNITY): Payer: BLUE CROSS/BLUE SHIELD

## 2015-11-20 ENCOUNTER — Other Ambulatory Visit: Payer: Self-pay | Admitting: Oncology

## 2015-11-20 ENCOUNTER — Other Ambulatory Visit: Payer: Self-pay | Admitting: *Deleted

## 2015-11-20 DIAGNOSIS — M3211 Endocarditis in systemic lupus erythematosus: Secondary | ICD-10-CM

## 2015-11-20 DIAGNOSIS — I358 Other nonrheumatic aortic valve disorders: Secondary | ICD-10-CM

## 2015-11-20 DIAGNOSIS — R740 Nonspecific elevation of levels of transaminase and lactic acid dehydrogenase [LDH]: Secondary | ICD-10-CM

## 2015-11-20 DIAGNOSIS — H532 Diplopia: Secondary | ICD-10-CM

## 2015-11-20 DIAGNOSIS — I330 Acute and subacute infective endocarditis: Secondary | ICD-10-CM

## 2015-11-20 DIAGNOSIS — I63119 Cerebral infarction due to embolism of unspecified vertebral artery: Secondary | ICD-10-CM

## 2015-11-20 DIAGNOSIS — I6340 Cerebral infarction due to embolism of unspecified cerebral artery: Principal | ICD-10-CM

## 2015-11-20 DIAGNOSIS — D691 Qualitative platelet defects: Secondary | ICD-10-CM

## 2015-11-20 DIAGNOSIS — D6861 Antiphospholipid syndrome: Secondary | ICD-10-CM

## 2015-11-20 LAB — COMPREHENSIVE METABOLIC PANEL
ALBUMIN: 4 g/dL (ref 3.5–5.0)
ALT: 132 U/L — ABNORMAL HIGH (ref 17–63)
ANION GAP: 7 (ref 5–15)
AST: 66 U/L — AB (ref 15–41)
Alkaline Phosphatase: 45 U/L (ref 38–126)
BUN: 16 mg/dL (ref 6–20)
CHLORIDE: 104 mmol/L (ref 101–111)
CO2: 25 mmol/L (ref 22–32)
Calcium: 9.3 mg/dL (ref 8.9–10.3)
Creatinine, Ser: 1.15 mg/dL (ref 0.61–1.24)
GFR calc Af Amer: >60 mL/min (ref >60)
GFR calc non Af Amer: >60 mL/min (ref >60)
GLUCOSE: 107 mg/dL — AB (ref 65–99)
POTASSIUM: 3.8 mmol/L (ref 3.5–5.1)
SODIUM: 136 mmol/L (ref 135–145)
Total Bilirubin: 0.8 mg/dL (ref 0.3–1.2)
Total Protein: 7.2 g/dL (ref 6.5–8.1)

## 2015-11-20 LAB — CBC
HCT: 38.8 % — ABNORMAL LOW (ref 39.0–52.0)
HEMATOCRIT: 38 % — AB (ref 39.0–52.0)
Hemoglobin: 13.2 g/dL (ref 13.0–17.0)
Hemoglobin: 13.7 g/dL (ref 13.0–17.0)
MCH: 29.3 pg (ref 26.0–34.0)
MCH: 29.7 pg (ref 26.0–34.0)
MCHC: 34.7 g/dL (ref 30.0–36.0)
MCHC: 35.3 g/dL (ref 30.0–36.0)
MCV: 84.4 fL (ref 78.0–100.0)
MCV: 84.2 fL (ref 78.0–100.0)
PLATELETS: 57 10*3/uL — AB (ref 150–400)
Platelets: 57 10*3/uL — ABNORMAL LOW (ref 150–400)
RBC: 4.5 MIL/uL (ref 4.22–5.81)
RBC: 4.61 MIL/uL (ref 4.22–5.81)
RDW: 12.8 % (ref 11.5–15.5)
RDW: 12.8 % (ref 11.5–15.5)
WBC: 5.7 10*3/uL (ref 4.0–10.5)
WBC: 5 10*3/uL (ref 4.0–10.5)

## 2015-11-20 LAB — PROTIME-INR
INR: 1.28
INR: 1.21
PROTHROMBIN TIME: 16.1 s — AB (ref 11.4–15.2)
Prothrombin Time: 15.4 seconds — ABNORMAL HIGH (ref 11.4–15.2)

## 2015-11-20 LAB — PULMONARY FUNCTION TEST
DL/VA % pred: 95 %
DL/VA: 4.29 ml/min/mmHg/L
DLCO COR % PRED: 78 %
DLCO COR: 23.14 ml/min/mmHg
DLCO unc % pred: 76 %
DLCO unc: 22.53 ml/min/mmHg
FEF 25-75 POST: 2.17 L/s
FEF 25-75 Pre: 2.09 L/sec
FEF2575-%Change-Post: 3 %
FEF2575-%PRED-POST: 73 %
FEF2575-%Pred-Pre: 71 %
FEV1-%Change-Post: 1 %
FEV1-%PRED-POST: 81 %
FEV1-%Pred-Pre: 81 %
FEV1-Post: 2.83 L
FEV1-Pre: 2.8 L
FEV1FVC-%CHANGE-POST: 4 %
FEV1FVC-%PRED-PRE: 96 %
FEV6-%Change-Post: -3 %
FEV6-%PRED-PRE: 88 %
FEV6-%Pred-Post: 85 %
FEV6-Post: 3.71 L
FEV6-Pre: 3.83 L
FEV6FVC-%Pred-Post: 104 %
FEV6FVC-%Pred-Pre: 104 %
FVC-%CHANGE-POST: -3 %
FVC-%PRED-POST: 81 %
FVC-%PRED-PRE: 84 %
FVC-POST: 3.71 L
FVC-PRE: 3.83 L
POST FEV1/FVC RATIO: 76 %
PRE FEV1/FVC RATIO: 73 %
Post FEV6/FVC ratio: 100 %
Pre FEV6/FVC Ratio: 100 %
RV % pred: 74 %
RV: 1.54 L
TLC % pred: 81 %
TLC: 5.33 L

## 2015-11-20 LAB — ABO/RH: ABO/RH(D): A POS

## 2015-11-20 LAB — HIV ANTIBODY (ROUTINE TESTING W REFLEX): HIV Screen 4th Generation wRfx: NONREACTIVE

## 2015-11-20 LAB — HEMOGLOBIN A1C
Hgb A1c MFr Bld: 5.3 % (ref 4.8–5.6)
Mean Plasma Glucose: 105 mg/dL

## 2015-11-20 LAB — APTT: aPTT: 79 seconds — ABNORMAL HIGH (ref 24–36)

## 2015-11-20 LAB — HEPARIN LEVEL (UNFRACTIONATED)
HEPARIN UNFRACTIONATED: 0.17 [IU]/mL — AB (ref 0.30–0.70)
HEPARIN UNFRACTIONATED: 0.14 [IU]/mL — AB (ref 0.30–0.70)

## 2015-11-20 LAB — TYPE AND SCREEN
ABO/RH(D): A POS
ANTIBODY SCREEN: NEGATIVE

## 2015-11-20 MED ORDER — ENOXAPARIN SODIUM 120 MG/0.8ML ~~LOC~~ SOLN: 90.0000 mg | Freq: Two times a day (BID) | SUBCUTANEOUS | 3 refills | Status: DC

## 2015-11-20 MED ORDER — ALBUTEROL SULFATE (2.5 MG/3ML) 0.083% IN NEBU
2.5000 mg | INHALATION_SOLUTION | Freq: Once | RESPIRATORY_TRACT | Status: AC
  Administered 2015-11-20: 2.5 mg via RESPIRATORY_TRACT

## 2015-11-20 NOTE — Progress Notes (Signed)
Patient being discharged home today due to family circumstances. Surgery scheduled for Tuesday with anticoagulation until that time. Thorough discussions with Dr. Cyndie ChimeGranfortuna and the Resident Team conclude patient will go home on enoxaparin. Patient weight is 86kg, therefore will supply enoxaparin 90mg  every 12 hours. Follow-up anti-Xa heparin level scheduled for Monday with Dr. Cyndie ChimeGranfortuna. Patient supplied with six syringes of enoxaparin 100mg /mL (Lot [id number], expiration 10/2017). Counseled on how to expel 0.1 mL liquid from syringe before use, proper administration technique and timing, and possible side effects. Patient verbalized understanding.   Claria Diceachel Stryder Poitra, PharmD Candidate, [facility] Monika SalkAngela Hazelwood, PharmD Candidate, [facility] Roxana HiresSandra Abasa-Addo, PharmD Candidate, [facility]

## 2015-11-20 NOTE — Progress Notes (Signed)
Patient refusing repeat blood cultures and further infectious disease work up including physical exam. This was discussed with primary team and Dr. Cyndie ChimeGranfortuna. Infectious Disease will sign off for now, but will follow results of Blood Cultures drawn on 10/18 which are in process, HIV, Hepatitis C, and Bartonella antibody. Agree with current treatment plan put in place by Primary Team, CVTS and Hematology.  Karlene LinemanAlexa Shonte Beutler, DO PGY-3 Internal Medicine Resident Pager # [phone] 11/20/2015 10:26 AM

## 2015-11-20 NOTE — Discharge Summary (Signed)
Name: Brian Lloyd MRN: [id number] DOB: [birth year] 55 y.o. PCP: Brian KimMark Hepler, PA-C  Date of Admission: 11/18/2015  7:47 PM Date of Discharge: 11/20/2015 Attending Physician: Brian CatalinaEmily B Mullen, MD  Discharge Diagnosis: 1. Multiple embolic stroke. 2.Libman-Sacks endocarditis 3.Antiphospholipid antibody syndrome    Discharge Medications:   Medication List    STOP taking these medications   warfarin 5 MG tablet Commonly known as:  COUMADIN     TAKE these medications   aspirin EC 81 MG tablet Take 1 tablet (81 mg total) by mouth daily. Patient states he has taken aspirin in the past without getting a reaction   enoxaparin 120 MG/0.8ML injection Commonly known as:  LOVENOX Inject 0.6 mLs (90 mg total) into the skin every 12 (twelve) hours. What changed:  how much to take  when to take this   multivitamin tablet Take 1 tablet by mouth daily.   psyllium 58.6 % packet Commonly known as:  METAMUCIL Take 1 packet by mouth daily.       Disposition and follow-up:   [facility]  At the hospital follow up visit please address:  1.  His anticoagulation status, and INR.  2.  Labs / imaging needed at time of follow-up: INR  3.  Pending labs/ test needing follow-up: Blood culture results.  Follow-up Appointments:  Monday, October 23 with Brian Lloyd at [facility]10:45AM.  Hospital Course by problem list:   Brian Lloyd has had a complicated history over the last few months.  In May of this year, he had a tendon rupture in the calf and had decreased mobility afterwards.  He developed a DVT in July of this year and was paced on xarelto.  He  had these episodes of blurry vision, foggy sensorium and vertigo since that time.  He had an MRI in September showing a small infarction in the left CBL.  At that time he had a TTE which showed an aortic vegetation and he was subsequently diagnosed with  antiphospholipid antibody Syndrome.  At that time he was changed to coumadin for anticoagulation.  The vegetation is thought to be related to his Antiphospholipid antibody syndrome. Later he had more episodes of transient loss of memory, blurry vision and vertigo, on repeat CT scan shows 2 new infarcts.  Libman-Sacks endocarditis with frequent embolic strokes. As he continued to get infarcts in spite of being on anticoagulation, he was admitted to the hospital for IV heparin. Cardiothoracic surgery was involved, he was seeing Brian Lloyd as an outpatient, and he  was  planned to get his surgery for aortic valve replacement on November 9. Because of his frequent embolic strokes, surgery was moved to October 24. He was discharge home due to a family emergency on Lovenox 90 mg twice daily. He has an follow-up appointment on Monday to see Brian Lloyd and have his INR checked. Although he has no sign of infection, we did blood cultures and the results are still pending. He was instructed to seek immediate medical attention if he ever experienced those symptoms again.  Abnormal liver function. His liver enzymes are still elevated, although slightly improved on the second day.  Might be due to embolic phenomena or autoimmune process itself.  Thrombocytopenia. He is having gradually decreasing platelets. There are stable at 57 last 2 days. There was in vitro platelet clumping on smear.  Discharge Vitals:   BP 105/68 (BP Location: Left Arm)   Pulse Marland Kitchen(!)  56   Temp 97.7 F (36.5 C) (Oral)   Resp 18   Ht 5\' 8"  (1.727 m)   Wt 190 lb 0.6 oz (86.2 kg)   SpO2 100%   BMI 28.89 kg/m   Gen.well-built, well-nourished man, alert and oriented. Lungs.Clear bilaterally CV. Regular rate and rhythm,no murmur rub or gallop. Abdomen.Soft, nontender, nondistended, bowel sounds positive. Extremities.No edema, no cyanosis, pulses 2+ bilaterally  Pertinent Labs, Studies, and Procedures:  CBC Latest Ref Rng &  Units 11/20/2015 11/19/2015 11/19/2015  WBC 4.0 - 10.5 K/uL 5.0 5.7 4.7  Hemoglobin 13.0 - 17.0 g/dL 09.8 11.9 14.7  Hematocrit 39.0 - 52.0 % 38.8(L) 38.0(L) 38.8(L)  Platelets 150 - 400 K/uL 57(L) 57(L) 65(L)   CMP Latest Ref Rng & Units 11/20/2015 11/19/2015 11/19/2015  Glucose 65 - 99 mg/dL 829(F) 621(H) 086(V)  BUN 6 - 20 mg/dL 16 18 78(I)  Creatinine 0.61 - 1.24 mg/dL 6.96 2.95 2.84(X)  Sodium 135 - 145 mmol/L 136 136 139  Potassium 3.5 - 5.1 mmol/L 3.8 3.8 4.6  Chloride 101 - 111 mmol/L 104 103 106  CO2 22 - 32 mmol/L 25 26 26   Calcium 8.9 - 10.3 mg/dL 9.3 9.4 9.1  Total Protein 6.5 - 8.1 g/dL 7.2 7.3 7.3  Total Bilirubin 0.3 - 1.2 mg/dL 0.8 0.8 0.9  Alkaline Phos 38 - 126 U/L 45 43 44  AST 15 - 41 U/L 66(H) 77(H) 84(H)  ALT 17 - 63 U/L 132(H) 141(H) 149(H)   Hep C antibody. <0.1 HIV. Nonreactive  MR brain with and without contrast and MRA.  FINDINGS: MRI HEAD FINDINGS  Brain: Previously noted punctate infarct in the posterior left cerebellum is near completely resolved on the DWI images. A new acute punctate cortical infarct is present in the anterior left frontal lobe on image 37 of series 3 in 27 of series 6. No other new or acute infarcts are present. T2 signal changes associated with the cerebellar infarct.  Focal T2 cortical signal abnormality is present anteriorly in the right frontal lobe on image 19 of series 7 without associated diffusion abnormality. There is associated enhancement within this region of the right frontal lobe. No other pathologic enhancement is present.  No other significant white matter disease is present. The basal ganglia are intact. The insular ribbon is normal.  The brainstem and cerebellum are otherwise normal. The internal auditory canals are within normal limits.  Vascular: Flow is present in the major intracranial arteries.  Skull and upper cervical spine: The skullbase is within normal limits. Midline sagittal structures are  normal. The upper cervical spine is within normal limits.  Sinuses/Orbits: The paranasal sinuses and mastoid air cells are clear. The globes and orbits are intact.  MRA HEAD FINDINGS  Internal carotid arteries are within normal limits from the high cervical segments through the ICA termini bilaterally. The A1 and M1 segments are normal. MCA bifurcations are within normal limits. The anterior communicating artery is patent. ACA and MCA branch vessels are within normal limits.  The vertebral arteries are codominant. The left PICA origin is visualized and normal. The right PICA origin is not visualized. The basilar artery is within normal limits. The right posterior cerebral artery originates from the basilar tip. The left posterior cerebral artery is of fetal type with a small left P1 segment.  IMPRESSION: 1. Interval evolution of punctate left cerebellar infarct. 2. Subacute cortical infarct in the anterior right frontal lobe with associated enhancement. The differential diagnosis would include less likely in  a septic emboli with focal encephalitis. There is no abscess formation. 3. Acute punctate cortical nonhemorrhagic infarct in the anterior left frontal lobe. 4. Infarcts of varying ages are compatible with a central embolic source in this patient with endocarditis. 5. Normal variant MRA circle of Willis without significant proximal stenosis, aneurysm, or branch vessel occlusion.  Discharge Instructions: Discharge Instructions    Diet - low sodium heart healthy    Complete by:  As directed    Discharge instructions    Complete by:  As directed    It was pleasure taking care of you. Please inject 90 mg of Lovenox in your skin every 12 hourly as directed. Please come Monday morning for your level check. U have an appointment with Dr. Dorette Grate on Monday morning at 10:30AM. Please follow the instructions given by your surgeon regarding your upcoming surgery.   Increase activity  slowly    Complete by:  As directed       Signed: Arnetha Courser, MD 11/20/2015, 1:32 PM   Pager: [phone]

## 2015-11-20 NOTE — Progress Notes (Signed)
Hematology Addendum: Full discussion of premature release of pre-op orders and erroneous medication administration with patient in the company of our medical team.  I was contacted by his wife. Concerned re his mental health  staying in hospital until planned surgery. In addition, her father died yesterday and Mr Brian Lloyd wants to be able to provide emotional support for her. I discussed risks/benefits of staying on IV heparin. Not practical to do this as outpatient on a weekend. Would also require a central venous catheter & I do not want anything irritating his heart right now. As a compromise, I will change him to 1 mg/kg  BID dosing of lovenox. He will return to hospital immediately if any new neuro symptoms.

## 2015-11-20 NOTE — Progress Notes (Signed)
ANTICOAGULATION CONSULT NOTE - Follow-up Consult  Pharmacy Consult for Heparin Indication: stroke and antiphospholipid antibody syndrome  Allergies  Allergen Reactions  . Naproxen Anaphylaxis    Patient Measurements: Height: 5\' 8"  (172.7 cm) Weight: 190 lb 0.6 oz (86.2 kg) IBW/kg (Calculated) : 68.4 Heparin Dosing Weight: 86 kg  Vital Signs: Temp: 97.7 F (36.5 C) (10/20 0641) Temp Source: Oral (10/20 0641) BP: 105/68 (10/20 0641) Pulse Rate: 56 (10/20 0641)  Labs:  Recent Labs  11/18/15 2348  11/19/15 0728 11/19/15 1202 11/19/15 1940 11/19/15 2248 11/20/15 0528  HGB 13.0  --   --  13.4  --  13.2  --   HCT 37.2*  --   --  38.8*  --  38.0*  --   PLT 68*  --   --  65*  --  57*  --   APTT 113*  --   --   --   --  79*  --   LABPROT 20.2*  --  28.0*  --  17.1* 16.1* 15.4*  INR 1.70  --  2.56  --  1.39 1.28 1.21  HEPARINUNFRC  --   < >  --  0.56 0.27*  --  0.17*  CREATININE 1.32*  --  1.05 1.30*  --  1.17  --   < > = values in this interval not displayed.  Estimated Creatinine Clearance: 74.4 mL/min (by C-G formula based on SCr of 1.17 mg/dL).  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 Pt is on IV heparin now, plan to have excision of arotic mass on 10/24.  Heparin level subtherapeutic (0.17) on gtt at 1000 units/hr. No issues with line or bleeding reported per RN.  Plt remain low at 57 - follow trend closely.  Goal of Therapy:  Heparin level 0.3-0.5 units/ml  Monitor platelets by anticoagulation protocol: Yes   Plan:  Increase heparin rate slightly to 1150 units/hr F/u 6 hr heparin level  Christoper Fabianaron Esparanza Krider, PharmD, BCPS Clinical pharmacist, pager [phone] 11/20/2015 7:03 AM

## 2015-11-20 NOTE — Care Management Note (Signed)
Case Management Note  Patient Details  Name: Brian Lloyd MRN: [id number] Date of Birth: 08/13/1960  Subjective/Objective:                    Action/Plan: Pt discharging home with self care. Pt on lovenox at discharge but was taking this prior to admission. No further needs per CM.  Expected Discharge Date:                  Expected Discharge Plan:  Home/Self Care  In-House Referral:     Discharge planning Services     Post Acute Care Choice:    Choice offered to:     DME Arranged:    DME Agency:     HH Arranged:    HH Agency:     Status of Service:  Completed, signed off  If discussed at [employer] Length of Stay Meetings, dates discussed:    Additional Comments:  [name], RN 11/20/2015, 2:07 PM

## 2015-11-20 NOTE — Progress Notes (Signed)
Patient is being dc home. Dc instructions given and patient verbalized understanding. Patient awaiting transportation at this time. 

## 2015-11-20 NOTE — Progress Notes (Signed)
      [address]       [name] [id number]             [phone]     CARDIOTHORACIC SURGERY PROGRESS NOTE  Subjective: No complaints.  No more transient neurologic Sx.  Patient has decided to go home on lovenox injections between now and the time of surgery  Objective: Vital signs in last 24 hours: Temp:  [97.7 F (36.5 C)-98.3 F (36.8 C)] 98.3 F (36.8 C) (10/20 1420) Pulse Rate:  [56-62] 60 (10/20 1420) Cardiac Rhythm: Sinus bradycardia (10/20 0700) Resp:  [18] 18 (10/20 1420) BP: (105-130)/(60-80) 128/72 (10/20 1420) SpO2:  [99 %-100 %] 100 % (10/20 1420)  Physical Exam:  Rhythm:   sinus  Breath sounds: clear  Heart sounds:  RRR  Incisions:  n/a  Abdomen:  soft  Extremities:  warm   Intake/Output from previous day: 10/19 0701 - 10/20 0700 In: 250 [I.V.:250] Out: -  Intake/Output this shift: No intake/output data recorded.  Lab Results:  Recent Labs  11/19/15 2248 11/20/15 0528  WBC 5.7 5.0  HGB 13.2 13.7  HCT 38.0* 38.8*  PLT 57* 57*   BMET:  Recent Labs  11/19/15 2248 11/20/15 0528  NA 136 136  K 3.8 3.8  CL 103 104  CO2 26 25  GLUCOSE 150* 107*  BUN 18 16  CREATININE 1.17 1.15  CALCIUM 9.4 9.3    CBG (last 3)  No results for input(s): GLUCAP in the last 72 hours. PT/INR:   Recent Labs  11/20/15 0528  LABPROT 15.4*  INR 1.21    CXR:  N/A  Assessment/Plan:  I have again reviewed the indications, risks, and potential benefits of surgery for resection of the presumed vegetation or mass adherent to the aortic valve with the patient this afternoon.  In the event that the patient's aortic valve cannot be repaired, discussion was held comparing the relative risks of mechanical valve replacement with need for lifelong anticoagulation versus use of a bioprosthetic tissue valve and the associated potential for late structural valve deterioration and failure.  This discussion was placed in the context of the patient's  particular circumstances, and as a result the patient specifically requests that their valve be replaced using a bioprosthetic tissue valve.   The patient understands and accepts all potential associated risks of surgery including but not limited to risk of death, stroke, myocardial infarction, congestive heart failure, respiratory failure, renal failure, pneumonia, bleeding requiring blood transfusion and or reexploration, arrhythmia, heart block or bradycardia requiring permanent pacemaker, aortic dissection or other major vascular complication, pleural effusions or other delayed complications related to continued congestive heart failure, and other late complications related to valve replacement including structural valve deterioration and failure, thrombosis, endocarditis, or paravalvular leak.  For surgery next Tuesday. Patient has been instructed to take his last dose of Lovenox on Monday morning.   I spent in excess of 15 minutes during the conduct of this hospital encounter and >50% of this time involved direct face-to-face encounter with the patient for counseling and/or coordination of their care.    Purcell Nailslarence H Owen, MD 11/20/2015 2:30pm

## 2015-11-20 NOTE — Progress Notes (Signed)
Writer called Lab again for patient's heparin level before dc.

## 2015-11-20 NOTE — Progress Notes (Signed)
Called lab to notify about patient's heparin level draw.

## 2015-11-20 NOTE — Progress Notes (Signed)
Hematology: No new neuro signs or sxs. Heparin subtherapeutic this AM & dose has been adjusted. Hb/Platelets stable. Exam  No focal neuro deficits. He saw cardiac surgeon last PM. Surgery tentatively posted for next Tuesday 10/24. Pre-op orders got released inadvertantly  by RN this AM and he got a dose of metoprolol. BP now 105/68, pulse 56. Other tests ordered for surgery will now be cancelled. Persistent, but not progressive,unexplained, transaminase elevations. Auto-immune? Only new meds were coumadin & lovenox. Impression: 1.Primary anti-phospholipid antibody syndrome 2.Libman-Sachs endocarditis due to 1. 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 Critical that he be kept in therapeutic range     Brian DarbyJames Tsuneo Faison, MD, FACP  Hematology-Oncology/Internal Medicine [phone]

## 2015-11-20 NOTE — Progress Notes (Signed)
Subjective: Patient was little upset today, that while he was kept nothing by mouth and was given a dose of metoprolol, which makes him dizzy and dropped his blood pressure.  Objective:  Vital signs in last 24 hours: Vitals:   11/19/15 1334 11/19/15 1729 11/19/15 2150 11/20/15 0641  BP: 108/67 130/80 121/80 105/68  Pulse: (!) 58 60 62 (!) 56  Resp: 18 18 18 18   Temp: 97.9 F (36.6 C) 97.8 F (36.6 C) 98.2 F (36.8 C) 97.7 F (36.5 C)  TempSrc: Oral Oral Oral Oral  SpO2: 99% 99% 100% 100%  Weight:      Height:       Gen. well-built, well-nourished man, alert and oriented. Lungs. Clear bilaterally CV. Regular rate and rhythm, no murmur rub or gallop. Abdomen. Soft, nontender, nondistended, bowel sounds positive. Extremities. No edema, no cyanosis, pulses 2+ bilaterally Neuro. Patient refused to have a neuro exam done, stating that Dr. Cyndie ChimeGranfortuna already did this morning.  Labs. CMP Latest Ref Rng & Units 11/20/2015 11/19/2015 11/19/2015  Glucose 65 - 99 mg/dL 454(U107(H) 981(X150(H) 914(N145(H)  BUN 6 - 20 mg/dL 16 18 82(N21(H)  Creatinine 0.61 - 1.24 mg/dL 5.621.15 1.301.17 8.65(H1.30(H)  Sodium 135 - 145 mmol/L 136 136 139  Potassium 3.5 - 5.1 mmol/L 3.8 3.8 4.6  Chloride 101 - 111 mmol/L 104 103 106  CO2 22 - 32 mmol/L 25 26 26   Calcium 8.9 - 10.3 mg/dL 9.3 9.4 9.1  Total Protein 6.5 - 8.1 g/dL 7.2 7.3 7.3  Total Bilirubin 0.3 - 1.2 mg/dL 0.8 0.8 0.9  Alkaline Phos 38 - 126 U/L 45 43 44  AST 15 - 41 U/L 66(H) 77(H) 84(H)  ALT 17 - 63 U/L 132(H) 141(H) 149(H)   CBC Latest Ref Rng & Units 11/20/2015 11/19/2015 11/19/2015  WBC 4.0 - 10.5 K/uL 5.0 5.7 4.7  Hemoglobin 13.0 - 17.0 g/dL 84.613.7 96.213.2 95.213.4  Hematocrit 39.0 - 52.0 % 38.8(L) 38.0(L) 38.8(L)  Platelets 150 - 400 K/uL 57(L) 57(L) 65(L)   INR. 1.21  Assessment/Plan:  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  his INR this morning was subtherapeutic at 1.21. Heparin dose was increased. Blood culture results still pending. Dr. Barry Dieneswens saw him last evening, he'll have his surgery now on October 24. His father-in-law passed away yesterday and he wants to go home to give his wife emotional support and attend funeral. After discussion with Dr. Cyndie ChimeGranfortuna we will stop his heparin at 3 PM today and he will be discharged home on Lovenox 1 mg per KG twice daily dose. And will follow the instructions given by Dr. Barry Dieneswens regarding his surgery.  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 He did not had any recent focal deficits. And denies any more nausea and episodes of being dizzy or blurry vision as he had them before with his stroke.  Abnormal liver function. His liver enzymes are still elevated, mildly improved as compared to yesterday. Might be due to embolic phenomena or autoimmune process itself.  Thrombocytopenia. He is having gradually decreasing platelets. There are stable at 57 last 2 days. There was in vitro platelet clumping on smear.  Previous abnormal kidney functions. His creatinine and BUN has been normalized.   Dispo: Being discharged today.  Arnetha CourserSumayya Fredrica Capano, MD 11/20/2015, 8:59 AM Pager: [phone]

## 2015-11-20 NOTE — Progress Notes (Signed)
Patient asked to not be disturbed tonight due to headache..the patient refused 1am vitals

## 2015-11-20 NOTE — Progress Notes (Signed)
Staff walked patient to the car at this time.

## 2015-11-20 NOTE — Progress Notes (Signed)
Patient wanted to take a shower and he is on heparin drip, MD paged and she said it was ok for patient to be saline locked for the shower.

## 2015-11-20 NOTE — Progress Notes (Signed)
Received a call from vascular regarding patient's doppler schedule for Monday at 9 am. Patient was notified.

## 2015-11-20 NOTE — Progress Notes (Signed)
RT note- made attempt to draw ABG on 11/19/15 at 22:00 hrs with negative results. Pt was suffering from a migraine at this time and did not want any one in his room until morning.

## 2015-11-21 LAB — HEPATITIS C ANTIBODY

## 2015-11-23 ENCOUNTER — Inpatient Hospital Stay (HOSPITAL_COMMUNITY): Admit: 2015-11-23 | Payer: BLUE CROSS/BLUE SHIELD

## 2015-11-23 ENCOUNTER — Encounter: Payer: Self-pay | Admitting: Oncology

## 2015-11-23 ENCOUNTER — Encounter (HOSPITAL_COMMUNITY): Payer: Self-pay | Admitting: Certified Registered Nurse Anesthetist

## 2015-11-23 ENCOUNTER — Ambulatory Visit (HOSPITAL_COMMUNITY)
Admission: RE | Admit: 2015-11-23 | Discharge: 2015-11-23 | Disposition: A | Discharge status: Home / Self Care | Payer: BLUE CROSS/BLUE SHIELD | Source: Ambulatory Visit | Attending: Oncology | Admitting: Oncology

## 2015-11-23 ENCOUNTER — Other Ambulatory Visit: Payer: Self-pay | Admitting: *Deleted

## 2015-11-23 ENCOUNTER — Inpatient Hospital Stay (HOSPITAL_COMMUNITY)
Admission: RE | Admit: 2015-11-23 | Discharge: 2015-11-23 | Disposition: A | Discharge status: Home / Self Care | Payer: BLUE CROSS/BLUE SHIELD | Source: Ambulatory Visit

## 2015-11-23 ENCOUNTER — Ambulatory Visit (INDEPENDENT_AMBULATORY_CARE_PROVIDER_SITE_OTHER): Payer: BLUE CROSS/BLUE SHIELD | Admitting: Oncology

## 2015-11-23 VITALS — BP 123/78 | HR 58 | Temp 97.7°F | Ht 68.0 in | Wt 189.1 lb

## 2015-11-23 DIAGNOSIS — I63119 Cerebral infarction due to embolism of unspecified vertebral artery: Secondary | ICD-10-CM

## 2015-11-23 DIAGNOSIS — D696 Thrombocytopenia, unspecified: Secondary | ICD-10-CM

## 2015-11-23 DIAGNOSIS — D6861 Antiphospholipid syndrome: Secondary | ICD-10-CM

## 2015-11-23 DIAGNOSIS — I330 Acute and subacute infective endocarditis: Secondary | ICD-10-CM

## 2015-11-23 DIAGNOSIS — I749 Embolism and thrombosis of unspecified artery: Secondary | ICD-10-CM | POA: Diagnosis not present

## 2015-11-23 DIAGNOSIS — Z01818 Encounter for other preprocedural examination: Secondary | ICD-10-CM | POA: Insufficient documentation

## 2015-11-23 DIAGNOSIS — Z8673 Personal history of transient ischemic attack (TIA), and cerebral infarction without residual deficits: Secondary | ICD-10-CM

## 2015-11-23 DIAGNOSIS — I639 Cerebral infarction, unspecified: Secondary | ICD-10-CM

## 2015-11-23 DIAGNOSIS — I825Z2 Chronic embolism and thrombosis of unspecified deep veins of left distal lower extremity: Secondary | ICD-10-CM

## 2015-11-23 DIAGNOSIS — B9689 Other specified bacterial agents as the cause of diseases classified elsewhere: Secondary | ICD-10-CM

## 2015-11-23 LAB — COMPREHENSIVE METABOLIC PANEL
ALT: 100 U/L — AB (ref 17–63)
ANION GAP: 7 (ref 5–15)
AST: 52 U/L — ABNORMAL HIGH (ref 15–41)
Albumin: 4.2 g/dL (ref 3.5–5.0)
Alkaline Phosphatase: 43 U/L (ref 38–126)
BUN: 19 mg/dL (ref 6–20)
CHLORIDE: 106 mmol/L (ref 101–111)
CO2: 24 mmol/L (ref 22–32)
CREATININE: 1.19 mg/dL (ref 0.61–1.24)
Calcium: 9.5 mg/dL (ref 8.9–10.3)
GFR calc non Af Amer: >60 mL/min (ref >60)
Glucose, Bld: 101 mg/dL — ABNORMAL HIGH (ref 65–99)
POTASSIUM: 4.4 mmol/L (ref 3.5–5.1)
SODIUM: 137 mmol/L (ref 135–145)
Total Bilirubin: 0.8 mg/dL (ref 0.3–1.2)
Total Protein: 7.5 g/dL (ref 6.5–8.1)

## 2015-11-23 LAB — URINALYSIS, ROUTINE W REFLEX MICROSCOPIC
Bilirubin Urine: NEGATIVE
Glucose, UA: NEGATIVE mg/dL
Hgb urine dipstick: NEGATIVE
Ketones, ur: NEGATIVE mg/dL
LEUKOCYTES UA: NEGATIVE
NITRITE: NEGATIVE
PH: 6 (ref 5.0–8.0)
Protein, ur: NEGATIVE mg/dL
SPECIFIC GRAVITY, URINE: 1.026 (ref 1.005–1.030)

## 2015-11-23 LAB — CBC WITH DIFFERENTIAL/PLATELET
BASOS PCT: 1 %
Basophils Absolute: 0 10*3/uL (ref 0.0–0.1)
EOS ABS: 0.1 10*3/uL (ref 0.0–0.7)
Eosinophils Relative: 3 %
HCT: 39.6 % (ref 39.0–52.0)
HEMOGLOBIN: 14 g/dL (ref 13.0–17.0)
Lymphocytes Relative: 31 %
Lymphs Abs: 1.4 10*3/uL (ref 0.7–4.0)
MCH: 29.4 pg (ref 26.0–34.0)
MCHC: 35.4 g/dL (ref 30.0–36.0)
MCV: 83.2 fL (ref 78.0–100.0)
MONOS PCT: 6 %
Monocytes Absolute: 0.3 10*3/uL (ref 0.1–1.0)
NEUTROS PCT: 60 %
Neutro Abs: 2.7 10*3/uL (ref 1.7–7.7)
PLATELETS: 106 10*3/uL — AB (ref 150–400)
RBC: 4.76 MIL/uL (ref 4.22–5.81)
RDW: 12.5 % (ref 11.5–15.5)
WBC: 4.5 10*3/uL (ref 4.0–10.5)

## 2015-11-23 LAB — BARTONELLA ANITBODY PANEL
B HENSELAE IGG: NEGATIVE {titer}
B QUINTANA IGM: NEGATIVE {titer}

## 2015-11-23 LAB — HEPARIN ANTI-XA: HEPARIN LMW: 0.53 [IU]/mL

## 2015-11-23 LAB — BARTONELLA ANTIBODY PANEL
B henselae IgM: NEGATIVE titer
B quintana IgG: NEGATIVE titer

## 2015-11-23 MED ORDER — SODIUM CHLORIDE 0.9 % IV SOLN
INTRAVENOUS | Status: DC
  Filled 2015-11-23: qty 2.5

## 2015-11-23 MED ORDER — DEXMEDETOMIDINE HCL IN NACL 400 MCG/100ML IV SOLN
0.1000 ug/kg/h | INTRAVENOUS | Status: DC
  Filled 2015-11-23: qty 100

## 2015-11-23 MED ORDER — POTASSIUM CHLORIDE 2 MEQ/ML IV SOLN
80.0000 meq | INTRAVENOUS | Status: DC
  Filled 2015-11-23: qty 40

## 2015-11-23 MED ORDER — CEFUROXIME SODIUM 1.5 G IJ SOLR
1.5000 g | INTRAMUSCULAR | Status: AC
  Administered 2015-11-24: .5 g via INTRAVENOUS
  Administered 2015-11-24: 1.5 g via INTRAVENOUS
  Filled 2015-11-23 (×2): qty 1.5

## 2015-11-23 MED ORDER — TRANEXAMIC ACID 1000 MG/10ML IV SOLN
1.5000 mg/kg/h | INTRAVENOUS | Status: AC
  Administered 2015-11-24 (×2): 1.5 mg/kg/h via INTRAVENOUS
  Filled 2015-11-23: qty 25

## 2015-11-23 MED ORDER — MAGNESIUM SULFATE 50 % IJ SOLN
40.0000 meq | INTRAMUSCULAR | Status: DC
Start: 2015-11-24 — End: 2015-11-24
  Filled 2015-11-23: qty 10

## 2015-11-23 MED ORDER — VANCOMYCIN HCL 1000 MG IV SOLR
INTRAVENOUS | Status: AC
  Administered 2015-11-24: 1000 mL
  Filled 2015-11-23: qty 1000

## 2015-11-23 MED ORDER — TRANEXAMIC ACID (OHS) PUMP PRIME SOLUTION
2.0000 mg/kg | INTRAVENOUS | Status: DC
  Filled 2015-11-23: qty 1.72

## 2015-11-23 MED ORDER — DEXTROSE 5 % IV SOLN
750.0000 mg | INTRAVENOUS | Status: DC
  Filled 2015-11-23: qty 750

## 2015-11-23 MED ORDER — DOPAMINE-DEXTROSE 3.2-5 MG/ML-% IV SOLN
0.0000 ug/kg/min | INTRAVENOUS | Status: DC
  Filled 2015-11-23: qty 250

## 2015-11-23 MED ORDER — SODIUM CHLORIDE 0.9 % IV SOLN
1250.0000 mg | INTRAVENOUS | Status: AC
  Administered 2015-11-24: 1250 mg via INTRAVENOUS
  Filled 2015-11-23: qty 1250

## 2015-11-23 MED ORDER — SODIUM CHLORIDE 0.9 % IV SOLN
INTRAVENOUS | Status: DC
  Filled 2015-11-23: qty 30

## 2015-11-23 MED ORDER — PLASMA-LYTE 148 IV SOLN
INTRAVENOUS | Status: DC
  Filled 2015-11-23: qty 2.5

## 2015-11-23 MED ORDER — PHENYLEPHRINE HCL 10 MG/ML IJ SOLN
30.0000 ug/min | INTRAMUSCULAR | Status: DC
  Filled 2015-11-23: qty 2

## 2015-11-23 MED ORDER — NITROGLYCERIN IN D5W 200-5 MCG/ML-% IV SOLN
2.0000 ug/min | INTRAVENOUS | Status: AC
  Administered 2015-11-24: 5 ug/min via INTRAVENOUS
  Filled 2015-11-23: qty 250

## 2015-11-23 MED ORDER — TRANEXAMIC ACID (OHS) BOLUS VIA INFUSION
15.0000 mg/kg | INTRAVENOUS | Status: AC
  Administered 2015-11-24: 1287 mg via INTRAVENOUS
  Filled 2015-11-23: qty 1287

## 2015-11-23 MED ORDER — EPINEPHRINE PF 1 MG/ML IJ SOLN
0.0000 ug/min | INTRAVENOUS | Status: AC
  Administered 2015-11-24: 5 ug/min via INTRAVENOUS
  Filled 2015-11-23: qty 4

## 2015-11-23 NOTE — Pre-Procedure Instructions (Signed)
    [facility]  11/23/2015      [id number] - [place], [place] - [address] [place] [id number] Phone: [phone] Fax: [phone]    Your procedure is scheduled on  Tuesday, November 24, 2015  [facility] at [facility]30 [facility].M.  Call this number if you have problems the morning of surgery:  [phone]   Remember:  Do not eat food or drink liquids after midnight.  Take these medicines the morning of surgery with A SIP OF WATER : if needed: pain medication, Visine eye drops Stop taking vitamins, fish oil and herbal medications. Do not take any NSAIDs ie: Ibuprofen, Advil, Naproxen, BC and Goody Powder;stop now.  Do not wear jewelry, make-up or nail polish.  Do not wear lotions, powders, or perfumes, or deoderant.  Do not shave 48 hours prior to surgery.  Men may shave face and neck.  Do not bring valuables to the hospital.  [facility] any belongings or valuables.  Contacts, dentures or bridgework may not be worn into surgery.  Leave your suitcase in the car.  After surgery it may be brought to your room.  For patients admitted to the hospital, discharge time will be determined by your treatment team.  Patients discharged the day of surgery will not be allowed to drive home.

## 2015-11-23 NOTE — Patient Instructions (Signed)
To Radiology now for Chest X-ray Take 40 mg of lovenox when you get home today [facility] at 5:30 AM tomorrow for surgery Return visit with Dr Reece AgarG in 3-4 weeks

## 2015-11-23 NOTE — Progress Notes (Signed)
Pt denies having a stress test. Pt made aware to stop taking vitamins, fish oil and herbal medications. Do not take any NSAIDs ie: Ibuprofen, Advil, Naproxen, BC and Goody Powder. Pt verbalized understanding of all pre-op instructions. Spoke with Alycia Rossettiyan, RN, regarding Aspirin administration the morning of procedure; Alycia RossettiRyan advised, pt to hold Aspirin on morning of surgery. Pt verbalized understanding of all pre-op instructions. Anesthesia reviewed pt history ( see note).

## 2015-11-23 NOTE — Progress Notes (Signed)
Hematology and Oncology Follow Up Visit  Brian Lloyd [id number] May 01, 1960 55 y.o. 11/23/2015 12:35 PM   Principle Diagnosis: Encounter Diagnoses  Name Primary?  . Thrombocytopenia ([place]) Yes  . 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 Aortic valve vegetation   . Cerebellar infarct ([place])   . Acute cerebral infarction ([place])   . Antiphospholipid antibody syndrome ([facility])      Interim History:  Short interval follow-up visit for this 55 year old man I recently saw for an office consultation on October 4 referred to evaluate thrombocytopenia. This turned out to be the tip of the iceberg. He presented with a history of a left lower extremity DVT occurring about one month following a tendon injury and was on full dose Xarelto. In mid-September he had sudden onset of vertigo and vertical diplopia and was found to have a small acute infarct in the left cerebellum. Echocardiogram did not show any ASD or PFO. However, a large 1.7 cm vegetation was seen on the aortic valve. At time of his visit with me, I reviewed the peripheral blood film. His platelets clumped spontaneously in vitro. This was partially but not completely obviated by using citrate anticoagulant. I obtained antiphospholipid antibodies and they were elevated in all 3 immunoglobulin classes both anticardiolipin and antibody beta 2 glycoprotein 1. I Advised the patient to stop the Xarelto. I started him on Lovenox 1.5 mg/kg per day, aspirin 81 mg daily, and Coumadin 5 mg daily. When he returned for an INR check on last Tuesday, he reported new neurologic symptoms over the weekend despite anticoagulants noted above. He was admitted to the hospital Wednesday evening.. Coumadin stopped. He was put on intravenous unfractionated heparin. Repeat MRI now showed 2 new areas of  acute/subacute infarction in the left and right frontal lobes. He had no further neurologic events on the IV heparin and low-dose aspirin. He has been under evaluation by cardiovascular surgery. Initial elective resection of the vegetation was scheduled for early November. However in view of the recurrent thrombotic events on anticoagulation, surgery has been moved up and will be done tomorrow 10/24. While he was in the hospital, his wife's father died. Although I was uncomfortable sending him home until definitive surgery, under the circumstances, he was discharged on Friday 10/21 on split dose Lovenox 1 mg/kg every 12 hours. Low-dose aspirin continued. He was instructed to take his Lovenox at 7 AM this morning prior to his visit with me. He received some conflicting information from his surgeon and did not take his dose. He reports no new neurologic symptoms over the weekend. No new changes in vision. No vertigo. Additional lab obtained at time of his visit and subsequently showed a normal white count with differential, ESR 2 mm, mild polyclonal IgM elevation, ANA negative, rheumatoid factor negative, negative blood cultures, negative HIV and hepatitis C, Bartonella antibodies pending.  Medications: reviewed  Allergies:  Allergies  Allergen Reactions  . Naproxen Anaphylaxis    Review of Systems: See interim history Remaining ROS negative:   Physical Exam: Blood pressure 123/78, pulse (!) 58, temperature 97.7 F (36.5 C), temperature source Oral, height 5' 8"  (1.727 m), weight 189 lb 1.6 oz (85.8 kg), SpO2 100 %. Wt Readings from Last 3 Encounters:  11/23/15 189 lb 1.6 oz (85.8 kg)  11/18/15 190 lb 0.6 oz (86.2 kg)  11/11/15 190 lb (86.2  kg)     General appearance: Well-nourished Caucasian man HENNT: Pharynx no erythema, exudate, mass, or ulcer. No thyromegaly or thyroid nodules Lymph nodes: No cervical, supraclavicular, or axillary lymphadenopathy Breasts:  Lungs: Clear to  auscultation, resonant to percussion throughout Heart: Regular rhythm, no murmur, no gallop, no rub, no click, no edema Abdomen: Soft, nontender, normal bowel sounds, no mass, no organomegaly Extremities: No edema, no calf tenderness Musculoskeletal: no joint deformities GU:  Vascular: Carotid pulses 2+, no bruits, distal pulses:  Neurologic: Alert, oriented, PERRLA, optic discs sharp and vessels normal, no hemorrhage or exudate but there is symmetric changes of the arteries which are atypical. cranial nerves grossly normal, full extraocular movements. No nystagmus. No facial asymmetry. Tongue midline. Palate elevates symmetrically. motor strength 5 over 5, reflexes 1+ symmetric, upper body coordination normal, gait normal,  Skin: No rash or ecchymosis  Lab Results: CBC W/Diff    Component Value Date/Time   WBC 4.5 11/23/2015 1058   RBC 4.76 11/23/2015 1058   HGB 14.0 11/23/2015 1058   HCT 39.6 11/23/2015 1058   PLT 106 (L) 11/23/2015 1058   MCV 83.2 11/23/2015 1058   MCH 29.4 11/23/2015 1058   MCHC 35.4 11/23/2015 1058   RDW 12.5 11/23/2015 1058   LYMPHSABS 1.4 11/23/2015 1058   MONOABS 0.3 11/23/2015 1058   EOSABS 0.1 11/23/2015 1058   BASOSABS 0.0 11/23/2015 1058     Chemistry      Component Value Date/Time   NA 137 11/23/2015 1058   K 4.4 11/23/2015 1058   CL 106 11/23/2015 1058   CO2 24 11/23/2015 1058   BUN 19 11/23/2015 1058   CREATININE 1.19 11/23/2015 1058   CREATININE 1.27 10/26/2015 1114      Component Value Date/Time   CALCIUM 9.5 11/23/2015 1058   ALKPHOS 43 11/23/2015 1058   AST 52 (H) 11/23/2015 1058   ALT 100 (H) 11/23/2015 1058   BILITOT 0.8 11/23/2015 1058    Anti-Xa level this morning representing a trough: 0.53 which is still in the therapeutic range.   Radiological Studies: Dg Chest 2 View  Result Date: 10/26/2015 CLINICAL DATA:  Preop heart catheterization. EXAM: CHEST  2 VIEW COMPARISON:  None. FINDINGS: The heart size and mediastinal  contours are within normal limits. Both lungs are clear. The visualized skeletal structures are unremarkable. IMPRESSION: No active cardiopulmonary disease. Electronically Signed   By: Kathreen Devoid   On: 10/26/2015 12:24   Mr Angiogram Head Wo Contrast  Result Date: 11/18/2015 CLINICAL DATA:  Dizziness, confusion, and memory loss with blurred vision over the last 3 months. Aortic valve vegetation and endocarditis. DVT. Cerebellar infarct. Anti phospholipid antibody syndrome. EXAM: MRI HEAD WITHOUT AND WITH CONTRAST MRA HEAD WITHOUT CONTRAST TECHNIQUE: Multiplanar, multiecho pulse sequences of the brain and surrounding structures were obtained without and with intravenous contrast. Angiographic images of the head were obtained using MRA technique without contrast. CONTRAST:  21m MULTIHANCE GADOBENATE DIMEGLUMINE 529 MG/ML IV SOLN COMPARISON:  MRI brain 10/06/2015. FINDINGS: MRI HEAD FINDINGS Brain: Previously noted punctate infarct in the posterior left cerebellum is near completely resolved on the DWI images. A new acute punctate cortical infarct is present in the anterior left frontal lobe on image 37 of series 3 in 27 of series 6. No other new or acute infarcts are present. T2 signal changes associated with the cerebellar infarct. Focal T2 cortical signal abnormality is present anteriorly in the right frontal lobe on image 19 of series 7 without associated diffusion abnormality. There is  associated enhancement within this region of the right frontal lobe. No other pathologic enhancement is present. No other significant white matter disease is present. The basal ganglia are intact. The insular ribbon is normal. The brainstem and cerebellum are otherwise normal. The internal auditory canals are within normal limits. Vascular: Flow is present in the major intracranial arteries. Skull and upper cervical spine: The skullbase is within normal limits. Midline sagittal structures are normal. The upper cervical spine  is within normal limits. Sinuses/Orbits: The paranasal sinuses and mastoid air cells are clear. The globes and orbits are intact. MRA HEAD FINDINGS Internal carotid arteries are within normal limits from the high cervical segments through the ICA termini bilaterally. The A1 and M1 segments are normal. MCA bifurcations are within normal limits. The anterior communicating artery is patent. ACA and MCA branch vessels are within normal limits. The vertebral arteries are codominant. The left PICA origin is visualized and normal. The right PICA origin is not visualized. The basilar artery is within normal limits. The right posterior cerebral artery originates from the basilar tip. The left posterior cerebral artery is of fetal type with a small left P1 segment. IMPRESSION: 1. Interval evolution of punctate left cerebellar infarct. 2. Subacute cortical infarct in the anterior right frontal lobe with associated enhancement. The differential diagnosis would include less likely in a septic emboli with focal encephalitis. There is no abscess formation. 3. Acute punctate cortical nonhemorrhagic infarct in the anterior left frontal lobe. 4. Infarcts of varying ages are compatible with a central embolic source in this patient with endocarditis. 5. Normal variant MRA circle of Willis without significant proximal stenosis, aneurysm, or branch vessel occlusion. Electronically Signed   By: San Morelle M.D.   On: 11/18/2015 15:04   Mr Jeri Cos ZO Contrast  Result Date: 11/18/2015 CLINICAL DATA:  Dizziness, confusion, and memory loss with blurred vision over the last 3 months. Aortic valve vegetation and endocarditis. DVT. Cerebellar infarct. Anti phospholipid antibody syndrome. EXAM: MRI HEAD WITHOUT AND WITH CONTRAST MRA HEAD WITHOUT CONTRAST TECHNIQUE: Multiplanar, multiecho pulse sequences of the brain and surrounding structures were obtained without and with intravenous contrast. Angiographic images of the head were  obtained using MRA technique without contrast. CONTRAST:  32m MULTIHANCE GADOBENATE DIMEGLUMINE 529 MG/ML IV SOLN COMPARISON:  MRI brain 10/06/2015. FINDINGS: MRI HEAD FINDINGS Brain: Previously noted punctate infarct in the posterior left cerebellum is near completely resolved on the DWI images. A new acute punctate cortical infarct is present in the anterior left frontal lobe on image 37 of series 3 in 27 of series 6. No other new or acute infarcts are present. T2 signal changes associated with the cerebellar infarct. Focal T2 cortical signal abnormality is present anteriorly in the right frontal lobe on image 19 of series 7 without associated diffusion abnormality. There is associated enhancement within this region of the right frontal lobe. No other pathologic enhancement is present. No other significant white matter disease is present. The basal ganglia are intact. The insular ribbon is normal. The brainstem and cerebellum are otherwise normal. The internal auditory canals are within normal limits. Vascular: Flow is present in the major intracranial arteries. Skull and upper cervical spine: The skullbase is within normal limits. Midline sagittal structures are normal. The upper cervical spine is within normal limits. Sinuses/Orbits: The paranasal sinuses and mastoid air cells are clear. The globes and orbits are intact. MRA HEAD FINDINGS Internal carotid arteries are within normal limits from the high cervical segments through the ICA termini  bilaterally. The A1 and M1 segments are normal. MCA bifurcations are within normal limits. The anterior communicating artery is patent. ACA and MCA branch vessels are within normal limits. The vertebral arteries are codominant. The left PICA origin is visualized and normal. The right PICA origin is not visualized. The basilar artery is within normal limits. The right posterior cerebral artery originates from the basilar tip. The left posterior cerebral artery is of fetal  type with a small left P1 segment. IMPRESSION: 1. Interval evolution of punctate left cerebellar infarct. 2. Subacute cortical infarct in the anterior right frontal lobe with associated enhancement. The differential diagnosis would include less likely in a septic emboli with focal encephalitis. There is no abscess formation. 3. Acute punctate cortical nonhemorrhagic infarct in the anterior left frontal lobe. 4. Infarcts of varying ages are compatible with a central embolic source in this patient with endocarditis. 5. Normal variant MRA circle of Willis without significant proximal stenosis, aneurysm, or branch vessel occlusion. Electronically Signed   By: San Morelle M.D.   On: 11/18/2015 15:04   Ct Coronary Morph W/cta Cor W/score W/ca W/cm &/or Wo/cm  Addendum Date: 11/10/2015   ADDENDUM REPORT: 11/10/2015 17:30 CLINICAL DATA:  55 year old male with h/o stroke and a mobile echodensity found on the non-coronary cusp of the aortic valve. EXAM: Cardiac CTA TECHNIQUE: The patient was scanned on a Philips 256 scanner. A 120 kV retrospective scan was triggered in the descending thoracic aorta at 111 HU's. Gantry rotation speed was 270 msecs and collimation was .9 mm. No beta blockade or nitro were given. The 3D data set was reconstructed in 5% intervals of the R-R cycle. Systolic and diastolic phases were analyzed on a dedicated work station using MPR, MIP and VRT modes. The patient received 80 cc of contrast. FINDINGS: Aortic Valve: Trileaflet. No calcifications. There is a lesion in between left and non-coronary cusp that measures 17 x 12 x 9 mm that could represents a thrombus or a fibroelastoma. Aorta:  Normal size, no calcifications, no dissection. Sinotubular Junction:  30 x 30 mm Ascending Thoracic Aorta:  31 x 31 mm Aortic Arch:  28 x 27 mm Descending Thoracic Aorta:  27 x 27 mm Sinus of Valsalva Measurements: Non-coronary:  36 mm Right -coronary:  34 mm Left -coronary:  36 mm Coronary Arteries:   The study was performed without use of NTG. There is right dominance. Left coronary artery originates posteriorly in between left and non-coronary cusp and continues anteriorly in a usual fashion. There is no plaque in the proximal arteries. There is normal pulmonary vein drainage into the left atrium. Left atrial appendage has no thrombus. Normal size of the pulmonary artery. IMPRESSION: 1. There is a lesion in between left and non-coronary cusp that measures 17 x 12 x 9 mm that could represents a thrombus or a fibroelastoma. There is no invasion into the adjacent structures. An aggressive anticoagulation and follow up TEE in 3-4 weeks is recommended. 2. Normal size of the thoracic aorta, no calcifications, no dissection. 3. Left coronary artery originates posteriorly in between left and non-coronary cusp and continues anteriorly in a usual fashion. There is no plaque in the proximal arteries. Ena Dawley Electronically Signed   By: Ena Dawley   On: 11/10/2015 17:30   Result Date: 11/10/2015 EXAM: OVER-READ INTERPRETATION  CT CHEST The following report is an over-read performed by radiologist Dr. Rebekah Chesterfield [facility] on 11/10/2015. This over-read does not include interpretation of cardiac or coronary anatomy  or pathology. The coronary CTA interpretation by the cardiologist is attached. COMPARISON:  None. FINDINGS: Several tiny pulmonary nodules are noted throughout the visualized portions of the lungs, measuring up to 4 mm in the right lower lobe (image 43 of series 5). No other larger more suspicious appearing pulmonary nodules or masses are noted within the visualized portions of the thorax. Within the visualized thorax there is no acute consolidative airspace disease, no pleural effusions, no pneumothorax and no lymphadenopathy. Visualized portions of the upper abdomen are unremarkable. There are no aggressive appearing lytic or blastic lesions noted in the visualized portions  of the skeleton. IMPRESSION: 1. Several tiny pulmonary nodules in the lungs bilaterally measuring 4 mm or less in size. These are nonspecific, and are statistically likely benign. No follow-up needed if patient is low-risk (and has no known or suspected primary neoplasm). Non-contrast chest CT can be considered in 12 months if patient is high-risk. This recommendation follows the consensus statement: Guidelines for Management of Incidental Pulmonary Nodules Detected on CT Images: From the Fleischner Society 2017; Radiology 2017; 284:228-243. Electronically Signed: By: Vinnie Langton M.D. On: 11/10/2015 15:07    Impression:  #1. Primary antiphospholipid antibody syndrome  #2. 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 Aortic valve vegetation  #4. Recurrent embolic stroke secondary to #1 and #3  Plan: He will proceed with attempted resection of the aortic valve vegetation tomorrow by Dr. Darylene Price. He remains at a extremely high risk for recurrent events and I am recommending that he take 40 mg of Lovenox when he returns home today. Resume anticoagulation as soon as possible after the surgery and continue indefinitely. Following 5 mg of warfarin 4 days and a single dose at 7.5 mg, INR became therapeutic while he was in the hospital. I would resume his warfarin as possible after the surgery at 5 mg daily and adjust as needed to maintain INR 2.5-3.5.    CC: Patient Care Team: Corine Shelter, PA-C as PCP - General (Physician Assistant)   Annia Belt, MD 10/23/201712:35 PM

## 2015-11-23 NOTE — Anesthesia Preprocedure Evaluation (Addendum)
Anesthesia Evaluation  Patient identified by MRN, date of birth, ID band Patient awake    Reviewed: Allergy & Precautions, H&P , NPO status , Patient's Chart, lab work & pertinent test results  Airway Mallampati: II  TM Distance: >3 FB Neck ROM: Full    Dental no notable dental hx. (+) Teeth Intact, Dental Advisory Given   Pulmonary neg pulmonary ROS,    Pulmonary exam normal breath sounds clear to auscultation       Cardiovascular + Valvular Problems/Murmurs  Rhythm:Regular Rate:Normal     Neuro/Psych CVA, No Residual Symptoms negative psych ROS   GI/Hepatic negative GI ROS, Neg liver ROS,   Endo/Other  negative endocrine ROS  Renal/GU negative Renal ROS  negative genitourinary   Musculoskeletal   Abdominal   Peds  Hematology negative hematology ROS (+)   Anesthesia Other Findings   Reproductive/Obstetrics negative OB ROS                          Anesthesia Physical Anesthesia Plan  ASA: III  Anesthesia Plan: General   Post-op Pain Management:    Induction: Intravenous  Airway Management Planned: Oral ETT  Additional Equipment: Arterial line, CVP, PA Cath, TEE, Ultrasound Guidance Line Placement and 3D TEE  Intra-op Plan:   Post-operative Plan: Post-operative intubation/ventilation  Informed Consent: I have reviewed the patients History and Physical, chart, labs and discussed the procedure including the risks, benefits and alternatives for the proposed anesthesia with the patient or authorized representative who has indicated his/her understanding and acceptance.   Dental advisory given  Plan Discussed with: CRNA  Anesthesia Plan Comments:         Anesthesia Quick Evaluation

## 2015-11-23 NOTE — Progress Notes (Addendum)
Anesthesia Chart Review: SAME DAY WORK-UP.  Patient is a 55 year old male scheduled for excision of aortic valve mass, possible AV repair or replacement on 11/24/15 by Dr. Cornelius Moras.  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  Patient was seen by hematologist Dr. Cyndie Chime today. He wrote: "He will proceed with attempted resection of the aortic valve vegetation tomorrow by Dr. Tressie Stalker. He remains at a extremely high risk for recurrent events and I am recommending that he take 40 mg of Lovenox when he returns home today. Resume anticoagulation as soon as possible after the surgery and continue indefinitely. Following 5 mg of warfarin 4 days and a single dose at 7.5 mg, INR became therapeutic while he was in the hospital. 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 Primary cardiologist is Dr. Bryan Lemma.  Meds include ASA 81 mg, Norco. Has been on Lovenox bridge while warfarin on hold.  10/19/15 EKG: NSR.  11/10/15 CT cardiac: IMPRESSION: 1. There is a lesion in between left and non-coronary cusp that measures 17 x 12 x 9 mm that could represents a thrombus or a fibroelastoma. There is no invasion into the adjacent structures. An aggressive anticoagulation and follow up TEE in 3-4 weeks is recommended. 2. Normal size of the thoracic aorta, no calcifications, no dissection. 3. Left coronary artery originates posteriorly in between left and non-coronary cusp and continues anteriorly in a usual fashion.  There is no plaque in the proximal arteries.  10/29/15 Cardiac cath: 1. No angiographic evidence of CAD Recommendations: No further ischemic workup.   10/22/15 TEE: Study Conclusions - Aortic valve: Large mobile mass along ventricular surface of AV   Measures 2 x 1 cm. Surface is very irregular (frondlike) Appears   to be attached to noncoronary cusp, near base. Base of this cusp   is thickened. Suspicious for fibroelastoma. Mild AI. - Left atrium: No evidence of thrombus in the atrial cavity or   appendage.  10/09/15 TTE/Bubble Study: Study Conclusions - Left ventricle: The cavity size was normal. Wall thickness was   increased in a pattern of mild LVH. Systolic function was normal.   The estimated ejection fraction was in the range of 60% to 65%.   Wall motion was normal; there were no regional wall motion   abnormalities. Features are consistent with a pseudonormal left   ventricular filling pattern, with concomitant abnormal relaxation   and increased filling pressure (grade 2 diastolic dysfunction). - Aortic valve: There was a medium-sized, 1.4 cm (L) x 0.6 cm (W)   vegetation on the left ventricular aspect. - Atrial septum: No defect or patent foramen ovale was identified. Impressions: - There is a medium sized mass on the aortic valve that is c/w a   vegetation.   Consider TEE for further evaluation if clinically indicated.  10/09/15 Carotid U/S: Homogeneous plaque on the right. Intimal thickening on the left. 1-39% BICA stenosis. Normal subclavian arteries, bilaterally. Patent vertebral arteries with antegrade flow.   11/18/15 MRI/MRA Head: IMPRESSION: 1. Interval evolution of punctate left cerebellar infarct. 2. Subacute cortical infarct in the anterior right frontal lobe with associated enhancement.  The differential diagnosis would include less likely in a septic emboli with focal encephalitis. There is no abscess formation. 3. Acute punctate cortical nonhemorrhagic  infarct in the anterior left frontal lobe. 4. Infarcts of varying ages are compatible with a central embolic source in this patient with endocarditis. 5. Normal variant MRA circle of Willis without significant proximal stenosis, aneurysm, or branch vessel occlusion.  11/23/15 CXR: MPRESSION: No active cardiopulmonary disease.  11/20/15 PFTs: FVC 3.83 (84%), FEV1 2.80 (81%), DLCO 22.53 (76%).   Labs from 11/23/15 noted. PLT count 106K. H/H 14.0/39.6. Cr 1.19. AST 52, ALT 100, slightly down from 11/20/15. HIV non-reactive, HCV ab < 0.1 on 11/20/15. He will need a T&S, PT/PTT on arrival and ABG if ordered by surgeon or anesthesiologists. (Update: No ABG needed per Dr. Cornelius Moraswen.)  Shonna ChockAllison Kailena Lubas, PA-C [facility]Anest[facility]iology Phone [phone] 11/23/2015 2:47 PM

## 2015-11-24 ENCOUNTER — Inpatient Hospital Stay (HOSPITAL_COMMUNITY): Payer: BLUE CROSS/BLUE SHIELD | Admitting: Certified Registered Nurse Anesthetist

## 2015-11-24 ENCOUNTER — Encounter (HOSPITAL_COMMUNITY)
Admission: RE | Disposition: A | Discharge status: Home / Self Care | Payer: Self-pay | Source: Ambulatory Visit | Attending: Thoracic Surgery (Cardiothoracic Vascular Surgery)

## 2015-11-24 ENCOUNTER — Inpatient Hospital Stay (HOSPITAL_COMMUNITY): Payer: BLUE CROSS/BLUE SHIELD

## 2015-11-24 ENCOUNTER — Other Ambulatory Visit: Payer: Self-pay

## 2015-11-24 ENCOUNTER — Inpatient Hospital Stay (HOSPITAL_COMMUNITY)
Admission: RE | Admit: 2015-11-24 | Discharge: 2015-12-02 | DRG: 219 | Disposition: A | Discharge status: Home / Self Care | Payer: BLUE CROSS/BLUE SHIELD | Source: Ambulatory Visit | Attending: Thoracic Surgery (Cardiothoracic Vascular Surgery) | Admitting: Thoracic Surgery (Cardiothoracic Vascular Surgery)

## 2015-11-24 ENCOUNTER — Inpatient Hospital Stay (HOSPITAL_COMMUNITY): Admit: 2015-11-24 | Payer: BLUE CROSS/BLUE SHIELD | Admitting: Thoracic Surgery (Cardiothoracic Vascular Surgery)

## 2015-11-24 ENCOUNTER — Encounter (HOSPITAL_COMMUNITY): Payer: Self-pay | Admitting: *Deleted

## 2015-11-24 DIAGNOSIS — Z6828 Body mass index (BMI) 28.0-28.9, adult: Secondary | ICD-10-CM

## 2015-11-24 DIAGNOSIS — I359 Nonrheumatic aortic valve disorder, unspecified: Secondary | ICD-10-CM | POA: Diagnosis not present

## 2015-11-24 DIAGNOSIS — I330 Acute and subacute infective endocarditis: Secondary | ICD-10-CM

## 2015-11-24 DIAGNOSIS — Z86718 Personal history of other venous thrombosis and embolism: Secondary | ICD-10-CM | POA: Diagnosis not present

## 2015-11-24 DIAGNOSIS — E8770 Fluid overload, unspecified: Secondary | ICD-10-CM | POA: Diagnosis not present

## 2015-11-24 DIAGNOSIS — Y838 Other surgical procedures as the cause of abnormal reaction of the patient, or of later complication, without mention of misadventure at the time of the procedure: Secondary | ICD-10-CM | POA: Diagnosis not present

## 2015-11-24 DIAGNOSIS — D62 Acute posthemorrhagic anemia: Secondary | ICD-10-CM | POA: Diagnosis not present

## 2015-11-24 DIAGNOSIS — N170 Acute kidney failure with tubular necrosis: Secondary | ICD-10-CM | POA: Diagnosis not present

## 2015-11-24 DIAGNOSIS — Y92234 Operating room of hospital as the place of occurrence of the external cause: Secondary | ICD-10-CM | POA: Diagnosis not present

## 2015-11-24 DIAGNOSIS — M3211 Endocarditis in systemic lupus erythematosus: Secondary | ICD-10-CM

## 2015-11-24 DIAGNOSIS — Z8673 Personal history of transient ischemic attack (TIA), and cerebral infarction without residual deficits: Secondary | ICD-10-CM

## 2015-11-24 DIAGNOSIS — Z8249 Family history of ischemic heart disease and other diseases of the circulatory system: Secondary | ICD-10-CM | POA: Diagnosis not present

## 2015-11-24 DIAGNOSIS — Z7901 Long term (current) use of anticoagulants: Secondary | ICD-10-CM

## 2015-11-24 DIAGNOSIS — Z888 Allergy status to other drugs, medicaments and biological substances status: Secondary | ICD-10-CM

## 2015-11-24 DIAGNOSIS — I959 Hypotension, unspecified: Secondary | ICD-10-CM | POA: Diagnosis not present

## 2015-11-24 DIAGNOSIS — J9811 Atelectasis: Secondary | ICD-10-CM | POA: Diagnosis not present

## 2015-11-24 DIAGNOSIS — Z7982 Long term (current) use of aspirin: Secondary | ICD-10-CM

## 2015-11-24 DIAGNOSIS — I351 Nonrheumatic aortic (valve) insufficiency: Secondary | ICD-10-CM | POA: Diagnosis present

## 2015-11-24 DIAGNOSIS — I358 Other nonrheumatic aortic valve disorders: Secondary | ICD-10-CM | POA: Diagnosis present

## 2015-11-24 DIAGNOSIS — I2510 Atherosclerotic heart disease of native coronary artery without angina pectoris: Secondary | ICD-10-CM | POA: Diagnosis not present

## 2015-11-24 DIAGNOSIS — Z953 Presence of xenogenic heart valve: Secondary | ICD-10-CM

## 2015-11-24 DIAGNOSIS — I97790 Other intraoperative cardiac functional disturbances during cardiac surgery: Secondary | ICD-10-CM | POA: Diagnosis not present

## 2015-11-24 DIAGNOSIS — I663 Occlusion and stenosis of cerebellar arteries: Secondary | ICD-10-CM | POA: Diagnosis not present

## 2015-11-24 DIAGNOSIS — Z951 Presence of aortocoronary bypass graft: Secondary | ICD-10-CM

## 2015-11-24 DIAGNOSIS — J939 Pneumothorax, unspecified: Secondary | ICD-10-CM

## 2015-11-24 DIAGNOSIS — I6310 Cerebral infarction due to embolism of unspecified precerebral artery: Secondary | ICD-10-CM | POA: Diagnosis not present

## 2015-11-24 DIAGNOSIS — D6861 Antiphospholipid syndrome: Secondary | ICD-10-CM

## 2015-11-24 DIAGNOSIS — I825Z2 Chronic embolism and thrombosis of unspecified deep veins of left distal lower extremity: Secondary | ICD-10-CM

## 2015-11-24 DIAGNOSIS — I63119 Cerebral infarction due to embolism of unspecified vertebral artery: Secondary | ICD-10-CM

## 2015-11-24 DIAGNOSIS — D696 Thrombocytopenia, unspecified: Secondary | ICD-10-CM | POA: Diagnosis present

## 2015-11-24 DIAGNOSIS — Q245 Malformation of coronary vessels: Secondary | ICD-10-CM | POA: Diagnosis not present

## 2015-11-24 DIAGNOSIS — I82402 Acute embolism and thrombosis of unspecified deep veins of left lower extremity: Secondary | ICD-10-CM | POA: Diagnosis not present

## 2015-11-24 DIAGNOSIS — J90 Pleural effusion, not elsewhere classified: Secondary | ICD-10-CM

## 2015-11-24 DIAGNOSIS — Y712 Prosthetic and other implants, materials and accessory cardiovascular devices associated with adverse incidents: Secondary | ICD-10-CM | POA: Diagnosis not present

## 2015-11-24 HISTORY — PX: AORTIC VALVE REPLACEMENT: SHX41

## 2015-11-24 HISTORY — PX: TEE WITHOUT CARDIOVERSION: SHX5443

## 2015-11-24 HISTORY — DX: Presence of xenogenic heart valve: Z95.3

## 2015-11-24 HISTORY — PX: EXCISION OF ATRIAL MYXOMA: SHX5821

## 2015-11-24 HISTORY — PX: CORONARY ARTERY BYPASS GRAFT: SHX141

## 2015-11-24 HISTORY — DX: Presence of aortocoronary bypass graft: Z95.1

## 2015-11-24 LAB — POCT I-STAT, CHEM 8
BUN: 14 mg/dL (ref 6–20)
BUN: 15 mg/dL (ref 6–20)
BUN: 15 mg/dL (ref 6–20)
BUN: 15 mg/dL (ref 6–20)
BUN: 15 mg/dL (ref 6–20)
BUN: 16 mg/dL (ref 6–20)
BUN: 17 mg/dL (ref 6–20)
BUN: 17 mg/dL (ref 6–20)
BUN: 17 mg/dL (ref 6–20)
BUN: 18 mg/dL (ref 6–20)
BUN: 18 mg/dL (ref 6–20)
CALCIUM ION: 1.33 mmol/L (ref 1.15–1.40)
CALCIUM ION: 1.08 mmol/L — AB (ref 1.15–1.40)
CALCIUM ION: 1.32 mmol/L (ref 1.15–1.40)
CALCIUM ION: 1.17 mmol/L (ref 1.15–1.40)
CALCIUM ION: 1.15 mmol/L (ref 1.15–1.40)
CALCIUM ION: 1.08 mmol/L — AB (ref 1.15–1.40)
CHLORIDE: 104 mmol/L (ref 101–111)
CHLORIDE: 104 mmol/L (ref 101–111)
CHLORIDE: 101 mmol/L (ref 101–111)
CHLORIDE: 104 mmol/L (ref 101–111)
CHLORIDE: 104 mmol/L (ref 101–111)
CHLORIDE: 108 mmol/L (ref 101–111)
CHLORIDE: 102 mmol/L (ref 101–111)
CREATININE: 0.8 mg/dL (ref 0.61–1.24)
CREATININE: 0.8 mg/dL (ref 0.61–1.24)
CREATININE: 0.8 mg/dL (ref 0.61–1.24)
CREATININE: 1 mg/dL (ref 0.61–1.24)
CREATININE: 1.1 mg/dL (ref 0.61–1.24)
CREATININE: 1.2 mg/dL (ref 0.61–1.24)
Calcium, Ion: 1.13 mmol/L — ABNORMAL LOW (ref 1.15–1.40)
Calcium, Ion: 1.17 mmol/L (ref 1.15–1.40)
Calcium, Ion: 1.19 mmol/L (ref 1.15–1.40)
Calcium, Ion: 1.38 mmol/L (ref 1.15–1.40)
Calcium, Ion: 1.43 mmol/L — ABNORMAL HIGH (ref 1.15–1.40)
Chloride: 102 mmol/L (ref 101–111)
Chloride: 105 mmol/L (ref 101–111)
Chloride: 105 mmol/L (ref 101–111)
Chloride: 105 mmol/L (ref 101–111)
Creatinine, Ser: 0.7 mg/dL (ref 0.61–1.24)
Creatinine, Ser: 0.9 mg/dL (ref 0.61–1.24)
Creatinine, Ser: 1 mg/dL (ref 0.61–1.24)
Creatinine, Ser: 1.1 mg/dL (ref 0.61–1.24)
Creatinine, Ser: 1.1 mg/dL (ref 0.61–1.24)
GLUCOSE: 149 mg/dL — AB (ref 65–99)
GLUCOSE: 113 mg/dL — AB (ref 65–99)
GLUCOSE: 171 mg/dL — AB (ref 65–99)
GLUCOSE: 173 mg/dL — AB (ref 65–99)
GLUCOSE: 75 mg/dL (ref 65–99)
GLUCOSE: 85 mg/dL (ref 65–99)
Glucose, Bld: 118 mg/dL — ABNORMAL HIGH (ref 65–99)
Glucose, Bld: 126 mg/dL — ABNORMAL HIGH (ref 65–99)
Glucose, Bld: 175 mg/dL — ABNORMAL HIGH (ref 65–99)
Glucose, Bld: 87 mg/dL (ref 65–99)
Glucose, Bld: 95 mg/dL (ref 65–99)
HCT: 35 % — ABNORMAL LOW (ref 39.0–52.0)
HCT: 33 % — ABNORMAL LOW (ref 39.0–52.0)
HCT: 26 % — ABNORMAL LOW (ref 39.0–52.0)
HCT: 21 % — ABNORMAL LOW (ref 39.0–52.0)
HCT: 29 % — ABNORMAL LOW (ref 39.0–52.0)
HCT: 20 % — ABNORMAL LOW (ref 39.0–52.0)
HEMATOCRIT: 23 % — AB (ref 39.0–52.0)
HEMATOCRIT: 25 % — AB (ref 39.0–52.0)
HEMATOCRIT: 27 % — AB (ref 39.0–52.0)
HEMATOCRIT: 27 % — AB (ref 39.0–52.0)
HEMATOCRIT: 29 % — AB (ref 39.0–52.0)
HEMOGLOBIN: 8.5 g/dL — AB (ref 13.0–17.0)
HEMOGLOBIN: 8.8 g/dL — AB (ref 13.0–17.0)
HEMOGLOBIN: 9.2 g/dL — AB (ref 13.0–17.0)
HEMOGLOBIN: 9.9 g/dL — AB (ref 13.0–17.0)
Hemoglobin: 11.9 g/dL — ABNORMAL LOW (ref 13.0–17.0)
Hemoglobin: 11.2 g/dL — ABNORMAL LOW (ref 13.0–17.0)
Hemoglobin: 9.2 g/dL — ABNORMAL LOW (ref 13.0–17.0)
Hemoglobin: 7.1 g/dL — ABNORMAL LOW (ref 13.0–17.0)
Hemoglobin: 9.9 g/dL — ABNORMAL LOW (ref 13.0–17.0)
Hemoglobin: 6.8 g/dL — CL (ref 13.0–17.0)
Hemoglobin: 7.8 g/dL — ABNORMAL LOW (ref 13.0–17.0)
POTASSIUM: 3.9 mmol/L (ref 3.5–5.1)
POTASSIUM: 4.1 mmol/L (ref 3.5–5.1)
POTASSIUM: 4.9 mmol/L (ref 3.5–5.1)
POTASSIUM: 5 mmol/L (ref 3.5–5.1)
POTASSIUM: 5.3 mmol/L — AB (ref 3.5–5.1)
Potassium: 4.3 mmol/L (ref 3.5–5.1)
Potassium: 4.4 mmol/L (ref 3.5–5.1)
Potassium: 4.5 mmol/L (ref 3.5–5.1)
Potassium: 4.7 mmol/L (ref 3.5–5.1)
Potassium: 4.6 mmol/L (ref 3.5–5.1)
Potassium: 5 mmol/L (ref 3.5–5.1)
SODIUM: 134 mmol/L — AB (ref 135–145)
SODIUM: 138 mmol/L (ref 135–145)
SODIUM: 138 mmol/L (ref 135–145)
SODIUM: 138 mmol/L (ref 135–145)
SODIUM: 139 mmol/L (ref 135–145)
SODIUM: 140 mmol/L (ref 135–145)
SODIUM: 142 mmol/L (ref 135–145)
Sodium: 139 mmol/L (ref 135–145)
Sodium: 138 mmol/L (ref 135–145)
Sodium: 140 mmol/L (ref 135–145)
Sodium: 139 mmol/L (ref 135–145)
TCO2: 20 mmol/L (ref 0–100)
TCO2: 23 mmol/L (ref 0–100)
TCO2: 23 mmol/L (ref 0–100)
TCO2: 25 mmol/L (ref 0–100)
TCO2: 26 mmol/L (ref 0–100)
TCO2: 26 mmol/L (ref 0–100)
TCO2: 26 mmol/L (ref 0–100)
TCO2: 26 mmol/L (ref 0–100)
TCO2: 27 mmol/L (ref 0–100)
TCO2: 27 mmol/L (ref 0–100)
TCO2: 27 mmol/L (ref 0–100)

## 2015-11-24 LAB — PREPARE RBC (CROSSMATCH)

## 2015-11-24 LAB — PROTIME-INR
INR: 1.02
INR: 1.38
PROTHROMBIN TIME: 17 s — AB (ref 11.4–15.2)
Prothrombin Time: 13.4 seconds (ref 11.4–15.2)

## 2015-11-24 LAB — GRAM STAIN

## 2015-11-24 LAB — POCT I-STAT 3, ART BLOOD GAS (G3+)
ACID-BASE DEFICIT: 1 mmol/L (ref 0.0–2.0)
ACID-BASE EXCESS: 1 mmol/L (ref 0.0–2.0)
Acid-base deficit: 6 mmol/L — ABNORMAL HIGH (ref 0.0–2.0)
BICARBONATE: 21 mmol/L (ref 20.0–28.0)
BICARBONATE: 25.4 mmol/L (ref 20.0–28.0)
BICARBONATE: 23.8 mmol/L (ref 20.0–28.0)
BICARBONATE: 23.7 mmol/L (ref 20.0–28.0)
Bicarbonate: 25.1 mmol/L (ref 20.0–28.0)
O2 SAT: 100 %
O2 Saturation: 100 %
O2 Saturation: 99 %
O2 Saturation: 99 %
O2 Saturation: 96 %
PCO2 ART: 48.9 mmHg — AB (ref 32.0–48.0)
PCO2 ART: 42.2 mmHg (ref 32.0–48.0)
PH ART: 7.241 — AB (ref 7.350–7.450)
PH ART: 7.387 (ref 7.350–7.450)
PO2 ART: 379 mmHg — AB (ref 83.0–108.0)
PO2 ART: 119 mmHg — AB (ref 83.0–108.0)
PO2 ART: 145 mmHg — AB (ref 83.0–108.0)
PO2 ART: 88 mmHg (ref 83.0–108.0)
Patient temperature: 36.9
TCO2: 26 mmol/L (ref 0–100)
TCO2: 23 mmol/L (ref 0–100)
TCO2: 27 mmol/L (ref 0–100)
TCO2: 25 mmol/L (ref 0–100)
TCO2: 25 mmol/L (ref 0–100)
pCO2 arterial: 42.8 mmHg (ref 32.0–48.0)
pCO2 arterial: 29 mmHg — ABNORMAL LOW (ref 32.0–48.0)
pCO2 arterial: 41.6 mmHg (ref 32.0–48.0)
pH, Arterial: 7.376 (ref 7.350–7.450)
pH, Arterial: 7.523 — ABNORMAL HIGH (ref 7.350–7.450)
pH, Arterial: 7.365 (ref 7.350–7.450)
pO2, Arterial: 348 mmHg — ABNORMAL HIGH (ref 83.0–108.0)

## 2015-11-24 LAB — GLUCOSE, CAPILLARY
GLUCOSE-CAPILLARY: 127 mg/dL — AB (ref 65–99)
GLUCOSE-CAPILLARY: 129 mg/dL — AB (ref 65–99)
GLUCOSE-CAPILLARY: 150 mg/dL — AB (ref 65–99)
Glucose-Capillary: 121 mg/dL — ABNORMAL HIGH (ref 65–99)
Glucose-Capillary: 168 mg/dL — ABNORMAL HIGH (ref 65–99)
Glucose-Capillary: 135 mg/dL — ABNORMAL HIGH (ref 65–99)

## 2015-11-24 LAB — CULTURE, BLOOD (ROUTINE X 2)
Culture: NO GROWTH
Culture: NO GROWTH

## 2015-11-24 LAB — CBC
HEMATOCRIT: 29.1 % — AB (ref 39.0–52.0)
Hemoglobin: 10.4 g/dL — ABNORMAL LOW (ref 13.0–17.0)
MCH: 29.5 pg (ref 26.0–34.0)
MCHC: 35.7 g/dL (ref 30.0–36.0)
MCV: 82.7 fL (ref 78.0–100.0)
PLATELETS: 61 10*3/uL — AB (ref 150–400)
RBC: 3.52 MIL/uL — ABNORMAL LOW (ref 4.22–5.81)
RDW: 12.8 % (ref 11.5–15.5)
WBC: 13.6 10*3/uL — AB (ref 4.0–10.5)

## 2015-11-24 LAB — SURGICAL PCR SCREEN
MRSA, PCR: NEGATIVE
Staphylococcus aureus: NEGATIVE

## 2015-11-24 LAB — POCT I-STAT 4, (NA,K, GLUC, HGB,HCT)
Glucose, Bld: 108 mg/dL — ABNORMAL HIGH (ref 65–99)
HCT: 29 % — ABNORMAL LOW (ref 39.0–52.0)
Hemoglobin: 9.9 g/dL — ABNORMAL LOW (ref 13.0–17.0)
Potassium: 4.4 mmol/L (ref 3.5–5.1)
Sodium: 142 mmol/L (ref 135–145)

## 2015-11-24 LAB — APTT
APTT: 45 s — AB (ref 24–36)
aPTT: 75 seconds — ABNORMAL HIGH (ref 24–36)

## 2015-11-24 LAB — PLATELET COUNT: Platelets: 138 10*3/uL — ABNORMAL LOW (ref 150–400)

## 2015-11-24 SURGERY — EXCISION, MYXOMA, CARDIAC ATRIUM
Anesthesia: General | Site: Chest

## 2015-11-24 MED ORDER — PHENYLEPHRINE 40 MCG/ML (10ML) SYRINGE FOR IV PUSH (FOR BLOOD PRESSURE SUPPORT)
PREFILLED_SYRINGE | INTRAVENOUS | Status: AC
  Filled 2015-11-24: qty 40

## 2015-11-24 MED ORDER — MUPIROCIN 2 % EX OINT
1.0000 "application " | TOPICAL_OINTMENT | Freq: Once | CUTANEOUS | Status: AC
  Administered 2015-11-24: 1 via TOPICAL

## 2015-11-24 MED ORDER — ORAL CARE MOUTH RINSE
15.0000 mL | Freq: Two times a day (BID) | OROMUCOSAL | Status: DC
  Administered 2015-11-24: 15 mL via OROMUCOSAL

## 2015-11-24 MED ORDER — SODIUM CHLORIDE 0.9 % IV SOLN
INTRAVENOUS | Status: DC
  Administered 2015-11-24: 18:00:00 via INTRAVENOUS

## 2015-11-24 MED ORDER — ASPIRIN EC 325 MG PO TBEC: 325.0000 mg | DELAYED_RELEASE_TABLET | Freq: Every day | ORAL | Status: DC

## 2015-11-24 MED ORDER — PHENYLEPHRINE HCL 10 MG/ML IJ SOLN
INTRAVENOUS | Status: DC | PRN
  Administered 2015-11-24: 15 ug/min via INTRAVENOUS

## 2015-11-24 MED ORDER — PROTAMINE SULFATE 10 MG/ML IV SOLN
INTRAVENOUS | Status: DC | PRN
  Administered 2015-11-24: 50 mg via INTRAVENOUS
  Administered 2015-11-24: 30 mg via INTRAVENOUS
  Administered 2015-11-24 (×2): 50 mg via INTRAVENOUS
  Administered 2015-11-24 (×2): 30 mg via INTRAVENOUS
  Administered 2015-11-24 (×2): 50 mg via INTRAVENOUS
  Administered 2015-11-24: 20 mg via INTRAVENOUS
  Administered 2015-11-24: 50 mg via INTRAVENOUS

## 2015-11-24 MED ORDER — PROPOFOL 10 MG/ML IV BOLUS
INTRAVENOUS | Status: AC
  Filled 2015-11-24: qty 40

## 2015-11-24 MED ORDER — TRANEXAMIC ACID 1000 MG/10ML IV SOLN
1.5000 mg/kg/h | INTRAVENOUS | Status: DC
  Filled 2015-11-24: qty 25

## 2015-11-24 MED ORDER — FENTANYL CITRATE (PF) 250 MCG/5ML IJ SOLN
INTRAMUSCULAR | Status: AC
  Filled 2015-11-24: qty 5

## 2015-11-24 MED ORDER — 0.9 % SODIUM CHLORIDE (POUR BTL) OPTIME
TOPICAL | Status: DC | PRN
  Administered 2015-11-24: 6000 mL
  Administered 2015-11-24 (×3): 1000 mL

## 2015-11-24 MED ORDER — HEPARIN SODIUM (PORCINE) 1000 UNIT/ML IJ SOLN
INTRAMUSCULAR | Status: AC
  Filled 2015-11-24: qty 2

## 2015-11-24 MED ORDER — SODIUM CHLORIDE 0.9% FLUSH: 10.0000 mL | INTRAVENOUS | Status: DC | PRN

## 2015-11-24 MED ORDER — ACETAMINOPHEN 160 MG/5ML PO SOLN: 650.0000 mg | Freq: Once | ORAL | Status: AC

## 2015-11-24 MED ORDER — EPINEPHRINE PF 1 MG/10ML IJ SOSY
PREFILLED_SYRINGE | INTRAMUSCULAR | Status: AC
  Filled 2015-11-24: qty 10

## 2015-11-24 MED ORDER — MUPIROCIN 2 % EX OINT
TOPICAL_OINTMENT | CUTANEOUS | Status: AC
  Administered 2015-11-24: 07:00:00
  Filled 2015-11-24: qty 22

## 2015-11-24 MED ORDER — PLASMA-LYTE 148 IV SOLN
INTRAVENOUS | Status: DC
  Filled 2015-11-24: qty 2.5

## 2015-11-24 MED ORDER — PROPOFOL 10 MG/ML IV BOLUS
INTRAVENOUS | Status: DC | PRN
  Administered 2015-11-24: 100 mg via INTRAVENOUS
  Administered 2015-11-24: 50 mg via INTRAVENOUS

## 2015-11-24 MED ORDER — ALBUMIN HUMAN 5 % IV SOLN
250.0000 mL | INTRAVENOUS | Status: AC | PRN
  Administered 2015-11-24 (×4): 250 mL via INTRAVENOUS
  Filled 2015-11-24 (×2): qty 250

## 2015-11-24 MED ORDER — BISACODYL 5 MG PO TBEC
10.0000 mg | DELAYED_RELEASE_TABLET | Freq: Every day | ORAL | Status: DC
  Administered 2015-11-27 – 2015-12-01 (×2): 10 mg via ORAL
  Filled 2015-11-24 (×4): qty 2

## 2015-11-24 MED ORDER — CEFUROXIME SODIUM 1.5 G IJ SOLR
1.5000 g | Freq: Two times a day (BID) | INTRAMUSCULAR | Status: AC
  Administered 2015-11-24 – 2015-11-26 (×4): 1.5 g via INTRAVENOUS
  Filled 2015-11-24 (×5): qty 1.5

## 2015-11-24 MED ORDER — LACTATED RINGERS IV SOLN
INTRAVENOUS | Status: DC | PRN
  Administered 2015-11-24 (×2): via INTRAVENOUS

## 2015-11-24 MED ORDER — METOPROLOL TARTRATE 25 MG/10 ML ORAL SUSPENSION: 12.5000 mg | Freq: Two times a day (BID) | ORAL | Status: DC

## 2015-11-24 MED ORDER — NOREPINEPHRINE BITARTRATE 1 MG/ML IV SOLN
0.0000 ug/min | INTRAVENOUS | Status: DC
  Filled 2015-11-24: qty 4

## 2015-11-24 MED ORDER — FENTANYL CITRATE (PF) 250 MCG/5ML IJ SOLN
INTRAMUSCULAR | Status: AC
  Filled 2015-11-24: qty 20

## 2015-11-24 MED ORDER — TRAMADOL HCL 50 MG PO TABS
50.0000 mg | ORAL_TABLET | ORAL | Status: DC | PRN
  Administered 2015-11-28: 50 mg via ORAL
  Administered 2015-11-29: 100 mg via ORAL
  Filled 2015-11-24: qty 2
  Filled 2015-11-24: qty 1

## 2015-11-24 MED ORDER — ROCURONIUM BROMIDE 10 MG/ML (PF) SYRINGE
PREFILLED_SYRINGE | INTRAVENOUS | Status: AC
  Filled 2015-11-24: qty 40

## 2015-11-24 MED ORDER — ONDANSETRON HCL 4 MG/2ML IJ SOLN
4.0000 mg | Freq: Four times a day (QID) | INTRAMUSCULAR | Status: DC | PRN
  Administered 2015-11-24 – 2015-11-28 (×4): 4 mg via INTRAVENOUS
  Filled 2015-11-24 (×4): qty 2

## 2015-11-24 MED ORDER — POTASSIUM CHLORIDE 10 MEQ/50ML IV SOLN: 10.0000 meq | INTRAVENOUS | Status: DC

## 2015-11-24 MED ORDER — METOPROLOL TARTRATE 12.5 MG HALF TABLET
12.5000 mg | ORAL_TABLET | Freq: Once | ORAL | Status: AC
  Administered 2015-11-24: 12.5 mg via ORAL
  Filled 2015-11-24: qty 1

## 2015-11-24 MED ORDER — SODIUM CHLORIDE 0.9 % IV SOLN
INTRAVENOUS | Status: DC
  Administered 2015-11-25: 3.4 [IU]/h via INTRAVENOUS
  Filled 2015-11-24: qty 2.5

## 2015-11-24 MED ORDER — METOPROLOL TARTRATE 5 MG/5ML IV SOLN: 2.5000 mg | INTRAVENOUS | Status: DC | PRN

## 2015-11-24 MED ORDER — DOPAMINE-DEXTROSE 3.2-5 MG/ML-% IV SOLN
INTRAVENOUS | Status: DC | PRN
  Administered 2015-11-24: 5 ug/kg/min via INTRAVENOUS

## 2015-11-24 MED ORDER — MAGNESIUM SULFATE 4 GM/100ML IV SOLN
4.0000 g | Freq: Once | INTRAVENOUS | Status: AC
  Administered 2015-11-24: 4 g via INTRAVENOUS
  Filled 2015-11-24: qty 100

## 2015-11-24 MED ORDER — CHLORHEXIDINE GLUCONATE 4 % EX LIQD: 30.0000 mL | CUTANEOUS | Status: DC

## 2015-11-24 MED ORDER — MORPHINE SULFATE (PF) 2 MG/ML IV SOLN
1.0000 mg | INTRAVENOUS | Status: DC | PRN
  Administered 2015-11-24 (×2): 2 mg via INTRAVENOUS
  Filled 2015-11-24 (×2): qty 1

## 2015-11-24 MED ORDER — FENTANYL CITRATE (PF) 250 MCG/5ML IJ SOLN
INTRAMUSCULAR | Status: DC | PRN
  Administered 2015-11-24 (×2): 250 ug via INTRAVENOUS
  Administered 2015-11-24: 100 ug via INTRAVENOUS
  Administered 2015-11-24: 650 ug via INTRAVENOUS
  Administered 2015-11-24: 250 ug via INTRAVENOUS

## 2015-11-24 MED ORDER — SODIUM CHLORIDE 0.9% FLUSH: 3.0000 mL | INTRAVENOUS | Status: DC | PRN

## 2015-11-24 MED ORDER — DOPAMINE-DEXTROSE 1.6-5 MG/ML-% IV SOLN
INTRAVENOUS | Status: DC | PRN
  Administered 2015-11-24: 5 ug/kg/min via INTRAVENOUS

## 2015-11-24 MED ORDER — MILRINONE LACTATE IN DEXTROSE 20-5 MG/100ML-% IV SOLN
0.3750 ug/kg/min | INTRAVENOUS | Status: AC
  Administered 2015-11-24: .2 ug/kg/min via INTRAVENOUS
  Filled 2015-11-24: qty 100

## 2015-11-24 MED ORDER — MILRINONE LACTATE IN DEXTROSE 20-5 MG/100ML-% IV SOLN
0.0000 ug/kg/min | INTRAVENOUS | Status: DC
  Administered 2015-11-25: 0.2 ug/kg/min via INTRAVENOUS
  Filled 2015-11-24: qty 100

## 2015-11-24 MED ORDER — ALBUMIN HUMAN 5 % IV SOLN
INTRAVENOUS | Status: DC | PRN
Start: 2015-11-24 — End: 2015-11-24
  Administered 2015-11-24 (×3): via INTRAVENOUS

## 2015-11-24 MED ORDER — SODIUM CHLORIDE 0.9 % IV SOLN
INTRAVENOUS | Status: DC | PRN
  Administered 2015-11-24: 0.2 ug/kg/h via INTRAVENOUS

## 2015-11-24 MED ORDER — VANCOMYCIN HCL IN DEXTROSE 1-5 GM/200ML-% IV SOLN
1000.0000 mg | Freq: Once | INTRAVENOUS | Status: AC
Start: 2015-11-24 — End: 2015-11-24
  Administered 2015-11-24: 1000 mg via INTRAVENOUS
  Filled 2015-11-24 (×2): qty 200

## 2015-11-24 MED ORDER — SODIUM CHLORIDE 0.9% FLUSH
3.0000 mL | Freq: Two times a day (BID) | INTRAVENOUS | Status: DC
  Administered 2015-11-25 – 2015-11-26 (×3): 3 mL via INTRAVENOUS
  Administered 2015-11-26: 09:00:00 via INTRAVENOUS
  Administered 2015-11-27 – 2015-12-01 (×6): 3 mL via INTRAVENOUS

## 2015-11-24 MED ORDER — LACTATED RINGERS IV SOLN
INTRAVENOUS | Status: DC | PRN
  Administered 2015-11-24: 07:00:00 via INTRAVENOUS

## 2015-11-24 MED ORDER — GLUTARALDEHYDE 0.625% SOAKING SOLUTION
TOPICAL | Status: DC | PRN
  Administered 2015-11-24: 1 via TOPICAL

## 2015-11-24 MED ORDER — SODIUM CHLORIDE 0.9 % IV SOLN: 250.0000 mL | INTRAVENOUS | Status: DC

## 2015-11-24 MED ORDER — PHENYLEPHRINE HCL 10 MG/ML IJ SOLN
INTRAMUSCULAR | Status: DC | PRN
  Administered 2015-11-24: 120 ug via INTRAVENOUS
  Administered 2015-11-24: 40 ug via INTRAVENOUS
  Administered 2015-11-24 (×3): 80 ug via INTRAVENOUS

## 2015-11-24 MED ORDER — MIDAZOLAM HCL 10 MG/2ML IJ SOLN
INTRAMUSCULAR | Status: AC
  Filled 2015-11-24: qty 2

## 2015-11-24 MED ORDER — CALCIUM CHLORIDE 10 % IV SOLN
INTRAVENOUS | Status: DC | PRN
  Administered 2015-11-24: 300 mg via INTRAVENOUS

## 2015-11-24 MED ORDER — LACTATED RINGERS IV SOLN
500.0000 mL | Freq: Once | INTRAVENOUS | Status: AC | PRN
  Administered 2015-11-24: 500 mL via INTRAVENOUS

## 2015-11-24 MED ORDER — FENTANYL CITRATE (PF) 250 MCG/5ML IJ SOLN
INTRAMUSCULAR | Status: AC
  Filled 2015-11-24: qty 10

## 2015-11-24 MED ORDER — FAMOTIDINE IN NACL 20-0.9 MG/50ML-% IV SOLN
20.0000 mg | Freq: Two times a day (BID) | INTRAVENOUS | Status: AC
  Administered 2015-11-24: 20 mg via INTRAVENOUS

## 2015-11-24 MED ORDER — ROCURONIUM BROMIDE 100 MG/10ML IV SOLN
INTRAVENOUS | Status: DC | PRN
  Administered 2015-11-24 (×4): 50 mg via INTRAVENOUS
  Administered 2015-11-24: 100 mg via INTRAVENOUS
  Administered 2015-11-24: 50 mg via INTRAVENOUS

## 2015-11-24 MED ORDER — SODIUM CHLORIDE 0.9 % IV SOLN
INTRAVENOUS | Status: DC | PRN
  Administered 2015-11-24: 1 [IU]/h via INTRAVENOUS

## 2015-11-24 MED ORDER — NITROGLYCERIN IN D5W 200-5 MCG/ML-% IV SOLN: 0.0000 ug/min | INTRAVENOUS | Status: DC

## 2015-11-24 MED ORDER — CHLORHEXIDINE GLUCONATE 0.12 % MT SOLN
15.0000 mL | Freq: Once | OROMUCOSAL | Status: AC
  Administered 2015-11-24: 15 mL via OROMUCOSAL
  Filled 2015-11-24: qty 15

## 2015-11-24 MED ORDER — PANTOPRAZOLE SODIUM 40 MG PO TBEC
40.0000 mg | DELAYED_RELEASE_TABLET | Freq: Every day | ORAL | Status: DC
  Administered 2015-11-25 – 2015-12-02 (×8): 40 mg via ORAL
  Filled 2015-11-24 (×8): qty 1

## 2015-11-24 MED ORDER — ACETAMINOPHEN 160 MG/5ML PO SOLN: 1000.0000 mg | Freq: Four times a day (QID) | ORAL | Status: DC

## 2015-11-24 MED ORDER — OXYCODONE HCL 5 MG PO TABS
5.0000 mg | ORAL_TABLET | ORAL | Status: DC | PRN
  Administered 2015-11-24: 5 mg via ORAL
  Administered 2015-11-25 – 2015-12-02 (×15): 10 mg via ORAL
  Filled 2015-11-24 (×2): qty 2
  Filled 2015-11-24: qty 1
  Filled 2015-11-24 (×4): qty 2
  Filled 2015-11-24: qty 1
  Filled 2015-11-24 (×7): qty 2
  Filled 2015-11-24: qty 1
  Filled 2015-11-24: qty 2

## 2015-11-24 MED ORDER — INSULIN REGULAR BOLUS VIA INFUSION
0.0000 [IU] | Freq: Three times a day (TID) | INTRAVENOUS | Status: DC
  Filled 2015-11-24: qty 10

## 2015-11-24 MED ORDER — GLUTARALDEHYDE 0.625% SOAKING SOLUTION
Freq: Once | TOPICAL | Status: DC
  Filled 2015-11-24: qty 50

## 2015-11-24 MED ORDER — DEXMEDETOMIDINE HCL IN NACL 200 MCG/50ML IV SOLN
0.0000 ug/kg/h | INTRAVENOUS | Status: DC
  Administered 2015-11-24: 0.1 ug/kg/h via INTRAVENOUS
  Filled 2015-11-24: qty 50

## 2015-11-24 MED ORDER — PLASMA-LYTE 148 IV SOLN
INTRAVENOUS | Status: DC | PRN
  Administered 2015-11-24: 500 mL

## 2015-11-24 MED ORDER — EPINEPHRINE PF 1 MG/10ML IJ SOSY
PREFILLED_SYRINGE | INTRAMUSCULAR | Status: DC | PRN
  Administered 2015-11-24: .3 ug via INTRAVENOUS
  Administered 2015-11-24: .1 ug via INTRAVENOUS
  Administered 2015-11-24 (×2): .3 ug via INTRAVENOUS

## 2015-11-24 MED ORDER — MIDAZOLAM HCL 2 MG/2ML IJ SOLN: 2.0000 mg | INTRAMUSCULAR | Status: DC | PRN

## 2015-11-24 MED ORDER — ACETAMINOPHEN 500 MG PO TABS
1000.0000 mg | ORAL_TABLET | Freq: Four times a day (QID) | ORAL | Status: AC
  Administered 2015-11-24 – 2015-11-29 (×16): 1000 mg via ORAL
  Filled 2015-11-24 (×17): qty 2

## 2015-11-24 MED ORDER — ACETAMINOPHEN 650 MG RE SUPP
650.0000 mg | Freq: Once | RECTAL | Status: AC
  Administered 2015-11-24: 650 mg via RECTAL

## 2015-11-24 MED ORDER — DOCUSATE SODIUM 100 MG PO CAPS
200.0000 mg | ORAL_CAPSULE | Freq: Every day | ORAL | Status: DC
  Administered 2015-11-25 – 2015-12-02 (×6): 200 mg via ORAL
  Filled 2015-11-24 (×7): qty 2

## 2015-11-24 MED ORDER — DEXMEDETOMIDINE HCL IN NACL 400 MCG/100ML IV SOLN
0.4000 ug/kg/h | INTRAVENOUS | Status: DC
  Filled 2015-11-24: qty 100

## 2015-11-24 MED ORDER — MORPHINE SULFATE (PF) 2 MG/ML IV SOLN
1.0000 mg | INTRAVENOUS | Status: DC | PRN
  Administered 2015-11-24 – 2015-11-26 (×6): 2 mg via INTRAVENOUS
  Filled 2015-11-24 (×6): qty 1

## 2015-11-24 MED ORDER — SODIUM CHLORIDE 0.45 % IV SOLN
INTRAVENOUS | Status: DC | PRN
  Administered 2015-11-24: 17:00:00 via INTRAVENOUS

## 2015-11-24 MED ORDER — PHENYLEPHRINE HCL 10 MG/ML IJ SOLN
0.0000 ug/min | INTRAVENOUS | Status: DC
  Administered 2015-11-25: 50 ug/min via INTRAVENOUS
  Filled 2015-11-24 (×3): qty 2

## 2015-11-24 MED ORDER — MIDAZOLAM HCL 5 MG/5ML IJ SOLN
INTRAMUSCULAR | Status: DC | PRN
  Administered 2015-11-24: 3 mg via INTRAVENOUS
  Administered 2015-11-24: 1 mg via INTRAVENOUS
  Administered 2015-11-24 (×3): 2 mg via INTRAVENOUS

## 2015-11-24 MED ORDER — LACTATED RINGERS IV SOLN: INTRAVENOUS | Status: DC

## 2015-11-24 MED ORDER — BISACODYL 10 MG RE SUPP: 10.0000 mg | Freq: Every day | RECTAL | Status: DC

## 2015-11-24 MED ORDER — SODIUM CHLORIDE 0.9 % IJ SOLN
OROMUCOSAL | Status: DC | PRN
  Administered 2015-11-24 (×6): 4 mL via TOPICAL

## 2015-11-24 MED ORDER — ASPIRIN 81 MG PO CHEW: 324.0000 mg | CHEWABLE_TABLET | Freq: Every day | ORAL | Status: DC

## 2015-11-24 MED ORDER — HEPARIN SODIUM (PORCINE) 1000 UNIT/ML IJ SOLN
INTRAMUSCULAR | Status: DC | PRN
  Administered 2015-11-24 (×2): 25 mL via INTRAVENOUS

## 2015-11-24 MED ORDER — CHLORHEXIDINE GLUCONATE 0.12 % MT SOLN
15.0000 mL | OROMUCOSAL | Status: AC
  Administered 2015-11-24: 15 mL via OROMUCOSAL

## 2015-11-24 MED ORDER — LACTATED RINGERS IV SOLN
INTRAVENOUS | Status: DC | PRN
  Administered 2015-11-24 (×3): via INTRAVENOUS

## 2015-11-24 MED ORDER — LACTATED RINGERS IV SOLN
INTRAVENOUS | Status: DC
  Administered 2015-11-24: 17:00:00 via INTRAVENOUS

## 2015-11-24 MED ORDER — METOPROLOL TARTRATE 12.5 MG HALF TABLET
12.5000 mg | ORAL_TABLET | Freq: Two times a day (BID) | ORAL | Status: DC
  Administered 2015-11-26 – 2015-12-02 (×10): 12.5 mg via ORAL
  Filled 2015-11-24 (×12): qty 1

## 2015-11-24 MED ORDER — SODIUM CHLORIDE 0.9 % IR SOLN
Status: DC | PRN
  Administered 2015-11-24: 3000 mL

## 2015-11-24 MED ORDER — NITROGLYCERIN 0.2 MG/ML ON CALL CATH LAB
INTRAVENOUS | Status: DC | PRN
  Administered 2015-11-24 (×4): 40 ug via INTRAVENOUS

## 2015-11-24 MED ORDER — SODIUM CHLORIDE 0.9% FLUSH
10.0000 mL | Freq: Two times a day (BID) | INTRAVENOUS | Status: DC
  Administered 2015-11-24: 40 mL
  Administered 2015-11-25: 10 mL
  Administered 2015-11-25: 20 mL
  Administered 2015-11-26 (×2): 10 mL

## 2015-11-24 MED FILL — Potassium Chloride Inj 2 mEq/ML: INTRAVENOUS | Qty: 40 | Status: AC

## 2015-11-24 MED FILL — Sodium Chloride IV Soln 0.9%: INTRAVENOUS | Qty: 3000 | Status: AC

## 2015-11-24 MED FILL — Mannitol IV Soln 20%: INTRAVENOUS | Qty: 500 | Status: AC

## 2015-11-24 MED FILL — Electrolyte-R (PH 7.4) Solution: INTRAVENOUS | Qty: 4000 | Status: AC

## 2015-11-24 MED FILL — Heparin Sodium (Porcine) Inj 1000 Unit/ML: INTRAMUSCULAR | Qty: 10 | Status: AC

## 2015-11-24 MED FILL — Heparin Sodium (Porcine) Inj 1000 Unit/ML: INTRAMUSCULAR | Qty: 30 | Status: AC

## 2015-11-24 MED FILL — Sodium Bicarbonate IV Soln 8.4%: INTRAVENOUS | Qty: 50 | Status: AC

## 2015-11-24 MED FILL — Magnesium Sulfate Inj 50%: INTRAMUSCULAR | Qty: 10 | Status: AC

## 2015-11-24 MED FILL — Lidocaine HCl IV Inj 20 MG/ML: INTRAVENOUS | Qty: 10 | Status: AC

## 2015-11-24 SURGICAL SUPPLY — 139 items
ADAPTER CARDIO PERF ANTE/RETRO (ADAPTER) ×6 IMPLANT
ADAPTER MULTI PERFUSION 15 (ADAPTER) ×3 IMPLANT
APPLIER CLIP 9.375 SM OPEN (CLIP) ×3
ATTRACTOMAT 16X20 MAGNETIC DRP (DRAPES) ×3 IMPLANT
BANDAGE ELASTIC 4 VELCRO ST LF (GAUZE/BANDAGES/DRESSINGS) ×3 IMPLANT
BANDAGE ELASTIC 6 VELCRO ST LF (GAUZE/BANDAGES/DRESSINGS) ×3 IMPLANT
BLADE MINI RND TIP GREEN BEAV (BLADE) ×3 IMPLANT
BLADE STERNUM SYSTEM 6 (BLADE) ×3 IMPLANT
BLADE SURG 10 STRL SS (BLADE) ×3 IMPLANT
BLADE SURG 11 STRL SS (BLADE) ×6 IMPLANT
BLADE SURG 15 STRL LF DISP TIS (BLADE) ×4 IMPLANT
BLADE SURG 15 STRL SS (BLADE) ×2
BLADE ULTRA TIP 2M (BLADE) ×3 IMPLANT
BNDG GAUZE ELAST 4 BULKY (GAUZE/BANDAGES/DRESSINGS) ×3 IMPLANT
CANISTER SUCTION 2500CC (MISCELLANEOUS) ×3 IMPLANT
CANNULA AORTIC ROOT 9FR (CANNULA) ×3 IMPLANT
CANNULA EZ GLIDE AORTIC 21FR (CANNULA) ×6 IMPLANT
CANNULA GUNDRY RCSP 15FR (MISCELLANEOUS) ×6 IMPLANT
CANNULA MC2 2 STG 36/46 NON-V (CANNULA) ×4 IMPLANT
CANNULA SOFTFLOW AORTIC 7M21FR (CANNULA) ×3 IMPLANT
CANNULA VENOUS 2 STG 34/46 (CANNULA) ×2
CANNULA VESSEL 3MM 2 BLNT TIP (CANNULA) ×6 IMPLANT
CATH CPB KIT OWEN (MISCELLANEOUS) IMPLANT
CATH HEART VENT LEFT (CATHETERS) ×2 IMPLANT
CATH THORACIC 36FR RT ANG (CATHETERS) ×3 IMPLANT
CLEANER TIP ELECTROSURG 2X2 (MISCELLANEOUS) ×3 IMPLANT
CLIP APPLIE 9.375 SM OPEN (CLIP) ×2 IMPLANT
CLIP FOGARTY SPRING 6M (CLIP) ×6 IMPLANT
CLIP RETRACTION 3.0MM CORONARY (MISCELLANEOUS) ×3 IMPLANT
CLIP TI MEDIUM 24 (CLIP) ×6 IMPLANT
CLIP TI WIDE RED SMALL 24 (CLIP) ×6 IMPLANT
CONN 1/2X1/2X1/2  BEN (MISCELLANEOUS) ×1
CONN 1/2X1/2X1/2 BEN (MISCELLANEOUS) ×2 IMPLANT
CONT SPEC 4OZ CLIKSEAL STRL BL (MISCELLANEOUS) ×6 IMPLANT
COUNTER NEEDLE 20 DBL MAG RED (NEEDLE) ×6 IMPLANT
COVER SURGICAL LIGHT HANDLE (MISCELLANEOUS) IMPLANT
CRADLE DONUT ADULT HEAD (MISCELLANEOUS) ×3 IMPLANT
DERMABOND ADVANCED (GAUZE/BANDAGES/DRESSINGS) ×1
DERMABOND ADVANCED .7 DNX12 (GAUZE/BANDAGES/DRESSINGS) ×2 IMPLANT
DEVICE SUT CK QUICK LOAD INDV (Prosthesis & Implant Heart) ×6 IMPLANT
DEVICE SUT CK QUICK LOAD MINI (Prosthesis & Implant Heart) ×18 IMPLANT
DRAIN CHANNEL 32F RND 10.7 FF (WOUND CARE) ×3 IMPLANT
DRAPE BILATERAL SPLIT (DRAPES) ×3 IMPLANT
DRAPE CARDIOVASCULAR INCISE (DRAPES)
DRAPE CV SPLIT W-CLR ANES SCRN (DRAPES) ×3 IMPLANT
DRAPE HALF SHEET 40X57 (DRAPES) ×3 IMPLANT
DRAPE INCISE IOBAN 66X45 STRL (DRAPES) ×12 IMPLANT
DRAPE PERI GROIN 82X75IN TIB (DRAPES) ×3 IMPLANT
DRAPE SLUSH/WARMER DISC (DRAPES) ×3 IMPLANT
DRAPE SRG 135X102X78XABS (DRAPES) IMPLANT
DRSG AQUACEL AG ADV 3.5X14 (GAUZE/BANDAGES/DRESSINGS) ×6 IMPLANT
DRSG COVADERM 4X14 (GAUZE/BANDAGES/DRESSINGS) IMPLANT
ELECT BLADE 4.0 EZ CLEAN MEGAD (MISCELLANEOUS) ×3
ELECT REM PT RETURN 9FT ADLT (ELECTROSURGICAL) ×6
ELECTRODE BLDE 4.0 EZ CLN MEGD (MISCELLANEOUS) ×2 IMPLANT
ELECTRODE REM PT RTRN 9FT ADLT (ELECTROSURGICAL) ×4 IMPLANT
FELT TEFLON 1X6 (MISCELLANEOUS) ×6 IMPLANT
FLUID NSS /IRRIG 3000 ML XXX (IV SOLUTION) ×3 IMPLANT
GAUZE SPONGE 4X4 12PLY STRL (GAUZE/BANDAGES/DRESSINGS) IMPLANT
GAUZE SPONGE 4X4 16PLY XRAY LF (GAUZE/BANDAGES/DRESSINGS) ×3 IMPLANT
GLOVE BIO SURGEON STRL SZ 6 (GLOVE) ×12 IMPLANT
GLOVE BIO SURGEON STRL SZ 6.5 (GLOVE) ×12 IMPLANT
GLOVE BIO SURGEON STRL SZ7 (GLOVE) ×3 IMPLANT
GLOVE BIO SURGEON STRL SZ7.5 (GLOVE) IMPLANT
GLOVE ORTHO TXT STRL SZ7.5 (GLOVE) ×9 IMPLANT
GOWN STRL REUS W/ TWL LRG LVL3 (GOWN DISPOSABLE) ×16 IMPLANT
GOWN STRL REUS W/TWL LRG LVL3 (GOWN DISPOSABLE) ×8
HEMOSTAT POWDER SURGIFOAM 1G (HEMOSTASIS) ×9 IMPLANT
INSERT FOGARTY XLG (MISCELLANEOUS) ×3 IMPLANT
KIT BASIN OR (CUSTOM PROCEDURE TRAY) ×3 IMPLANT
KIT DRAINAGE VACCUM ASSIST (KITS) ×3 IMPLANT
KIT ROOM TURNOVER OR (KITS) ×3 IMPLANT
KIT SUCTION CATH 14FR (SUCTIONS) ×15 IMPLANT
KIT SUT CK MINI COMBO 4X17 (Prosthesis & Implant Heart) ×36 IMPLANT
LEAD PACING MYOCARDI (MISCELLANEOUS) ×3 IMPLANT
LINE VENT (MISCELLANEOUS) ×3 IMPLANT
LOOP VESSEL MAXI BLUE (MISCELLANEOUS) ×3 IMPLANT
MARKER GRAFT CORONARY BYPASS (MISCELLANEOUS) ×6 IMPLANT
NS IRRIG 1000ML POUR BTL (IV SOLUTION) ×24 IMPLANT
PACK OPEN HEART (CUSTOM PROCEDURE TRAY) ×3 IMPLANT
PAD ARMBOARD 7.5X6 YLW CONV (MISCELLANEOUS) ×6 IMPLANT
PENCIL BUTTON HOLSTER BLD 10FT (ELECTRODE) ×6 IMPLANT
PUNCH AORTIC ROTATE  4.5MM 8IN (MISCELLANEOUS) ×3 IMPLANT
SET CARDIOPLEGIA MPS 5001102 (MISCELLANEOUS) ×6 IMPLANT
SET IRRIG TUBING LAPAROSCOPIC (IRRIGATION / IRRIGATOR) ×3 IMPLANT
SOLUTION ANTI FOG 6CC (MISCELLANEOUS) ×6 IMPLANT
SPONGE GAUZE 4X4 12PLY STER LF (GAUZE/BANDAGES/DRESSINGS) ×6 IMPLANT
SPONGE LAP 18X18 X RAY DECT (DISPOSABLE) ×12 IMPLANT
SPONGE LAP 4X18 X RAY DECT (DISPOSABLE) ×3 IMPLANT
SUT BONE WAX W31G (SUTURE) ×6 IMPLANT
SUT ETHIBON 2 0 V 52N 30 (SUTURE) ×6 IMPLANT
SUT ETHIBON EXCEL 2-0 V-5 (SUTURE) IMPLANT
SUT ETHIBOND 2 0 SH (SUTURE)
SUT ETHIBOND 2 0 SH 36X2 (SUTURE) IMPLANT
SUT ETHIBOND 2 0 V4 (SUTURE) IMPLANT
SUT ETHIBOND 2 0V4 GREEN (SUTURE) IMPLANT
SUT ETHIBOND 4 0 RB 1 (SUTURE) IMPLANT
SUT ETHIBOND V-5 VALVE (SUTURE) IMPLANT
SUT ETHIBOND X763 2 0 SH 1 (SUTURE) ×18 IMPLANT
SUT MNCRL AB 3-0 PS2 18 (SUTURE) ×12 IMPLANT
SUT MNCRL AB 4-0 PS2 18 (SUTURE) ×3 IMPLANT
SUT PDS AB 1 CTX 36 (SUTURE) ×15 IMPLANT
SUT PROLENE 4 0 RB 1 (SUTURE) ×15
SUT PROLENE 4 0 SH DA (SUTURE) ×9 IMPLANT
SUT PROLENE 4-0 RB1 .5 CRCL 36 (SUTURE) ×30 IMPLANT
SUT PROLENE 5 0 C 1 36 (SUTURE) IMPLANT
SUT PROLENE 6 0 C 1 30 (SUTURE) ×15 IMPLANT
SUT PROLENE 7 0 BV1 MDA (SUTURE) ×3 IMPLANT
SUT PROLENE 7.0 RB 3 (SUTURE) ×15 IMPLANT
SUT PROLENE BLUE 7 0 (SUTURE) ×3 IMPLANT
SUT PROLENE POLY MONO (SUTURE) ×9 IMPLANT
SUT SILK  1 MH (SUTURE) ×1
SUT SILK 1 MH (SUTURE) ×2 IMPLANT
SUT SILK 1 TIES 10X30 (SUTURE) ×3 IMPLANT
SUT SILK 2 0 SH CR/8 (SUTURE) IMPLANT
SUT SILK 2 0 TIES 10X30 (SUTURE) ×3 IMPLANT
SUT SILK 3 0 SH CR/8 (SUTURE) IMPLANT
SUT SILK 4 0 TIE 10X30 (SUTURE) ×3 IMPLANT
SUT STEEL 6MS V (SUTURE) IMPLANT
SUT STEEL STERNAL CCS#1 18IN (SUTURE) ×6 IMPLANT
SUT STEEL SZ 6 DBL 3X14 BALL (SUTURE) ×12 IMPLANT
SUT VIC AB 2-0 CT1 27 (SUTURE) ×1
SUT VIC AB 2-0 CT1 TAPERPNT 27 (SUTURE) ×2 IMPLANT
SUT VIC AB 2-0 CTX 27 (SUTURE) IMPLANT
SUTURE E-PAK OPEN HEART (SUTURE) ×6 IMPLANT
SWAB COLLECTION DEVICE MRSA (MISCELLANEOUS) ×3 IMPLANT
SYSTEM SAHARA CHEST DRAIN ATS (WOUND CARE) ×3 IMPLANT
TAPE CLOTH SURG 4X10 WHT LF (GAUZE/BANDAGES/DRESSINGS) ×6 IMPLANT
TOWEL OR 17X24 6PK STRL BLUE (TOWEL DISPOSABLE) ×9 IMPLANT
TOWEL OR 17X26 10 PK STRL BLUE (TOWEL DISPOSABLE) ×6 IMPLANT
TRAY FOLEY IC TEMP SENS 16FR (CATHETERS) ×3 IMPLANT
TUBE ANAEROBIC SPECIMEN COL (MISCELLANEOUS) ×3 IMPLANT
TUBE SUCTION CARDIAC 10FR (CANNULA) ×3 IMPLANT
TUBING INSUFFLATION (TUBING) ×3 IMPLANT
UNDERPAD 30X30 (UNDERPADS AND DIAPERS) ×3 IMPLANT
VALVE MAGNA EASE AORTIC 25MM (Prosthesis & Implant Heart) ×3 IMPLANT
VENT LEFT HEART 12002 (CATHETERS) ×3
WATER STERILE IRR 1000ML POUR (IV SOLUTION) ×6 IMPLANT
YANKAUER SUCT BULB TIP NO VENT (SUCTIONS) ×3 IMPLANT

## 2015-11-24 NOTE — Progress Notes (Addendum)
RT NOTE:  Cardiac rapid wean started @ 2020. Pt following commands.

## 2015-11-24 NOTE — Brief Op Note (Addendum)
11/24/2015  11:12 AM  PATIENT:  Brian Lloyd  55 y.o. male  PRE-OPERATIVE DIAGNOSIS:  1. Libman-Sacks endocarditis 2.AV MASS 3. Mild to moderate AI  POST-OPERATIVE DIAGNOSIS: 1. Libman-Sacks endocarditis 2.AV MASS 3. Mild to moderate AI  PROCEDURE:  TRANSESOPHAGEAL ECHOCARDIOGRAM (TEE),  MEDIAN STERNOTOMY for EXCISION OF AORTIC VALVE MASS,   AORTIC VALVE REPLACEMENT  CORONARY ARTERY BYPASS GRAFTING X 2 -SVG to LAD -SVG to OM1 ENDOSCOPIC HARVEST GREATER SAPHENOUS VEIN -Right Thigh   SURGEON:    Purcell Nailslarence H Owen, MD  ASSISTANTS:  Ardelle Ballsonielle M Zimmerman, PA-C  ANESTHESIA:   Gaynelle AduWilliam Fitzgerald, MD  CROSSCLAMP TIME:   125'  CARDIOPULMONARY BYPASS TIME: 167'  FINDINGS:  Hemorrhagic vegetation densely adherent to ventricular surface of aortic valve  Numerous PMN's but no organisms seen on intraoperative Gram stain of vegetation  Moderate (2+/3+) central aortic insufficiency after attempted valve repair requiring valve replacement  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

## 2015-11-24 NOTE — Procedures (Signed)
Extubation Procedure Note NIF: -40 VC: 1400 Pt completed Rapid wean without complication Follows all commands, cuff leak positive, no stridor Pt clearly speaks name, location Cough/clears secretions Extubated to 2L [place]  Patient Details:   Name: Brian Lloyd DOB: [birth year] MRN: [id number]   Airway Documentation:   Extubated to 2L [place]  Evaluation  O2 sats: stable throughout Complications: No apparent complications Patient did tolerate procedure well. Bilateral Breath Sounds: Clear   Yes  Elmer PickerClifton, Delise Simenson D 11/24/2015, 9:30 PM

## 2015-11-24 NOTE — OR Nursing (Signed)
1714 Rolling call made to SICU.

## 2015-11-24 NOTE — Anesthesia Procedure Notes (Addendum)
Procedure Name: Intubation Date/Time: 11/24/2015 8:03 AM Performed by: Reine JustFLOWERS, Aston Lieske T Pre-anesthesia Checklist: Patient identified, Emergency Drugs available, Suction available, Patient being monitored and Timeout performed Patient Re-evaluated:Patient Re-evaluated prior to inductionOxygen Delivery Method: Circle system utilized and Simple face mask Preoxygenation: Pre-oxygenation with 100% oxygen Intubation Type: IV induction Ventilation: Mask ventilation without difficulty Laryngoscope Size: Miller and 3 Grade View: Grade I Tube type: Subglottic suction tube Tube size: 8.0 mm Number of attempts: 1 Airway Equipment and Method: Patient positioned with wedge pillow and Stylet Placement Confirmation: ETT inserted through vocal cords under direct vision,  positive ETCO2 and breath sounds checked- equal and bilateral Secured at: 22 cm Tube secured with: Tape Dental Injury: Teeth and Oropharynx as per pre-operative assessment

## 2015-11-24 NOTE — Op Note (Signed)
CARDIOTHORACIC SURGERY OPERATIVE NOTE  Date of Procedure:  11/24/2015  Preoperative Diagnosis:   Aortic Valve Mass  Recurrent Embolic Stroke  Postoperative Diagnosis: same   Procedure:    Excision of Aortic Valve Mass   Aortic Valve Replacement  [facility] Magna Ease Pericardial Tissue Valve (size 25 mm, model # 3300TFX, serial # [id number])   Coronary Artery Bypass Grafting x 2   Saphenous Vein Graft to Distal Left Anterior Descending Coronary Artery  Saphenous Vein Graft to Obtuse Marginal Branch of Left Circumflex Coronary Artery  Endoscopic Vein Harvest from Right Thigh     Surgeon: Salvatore Decent. 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 above the left-non coronary commissure  Acute onset diffuse ST segment elevation with hypotension and severe dysfunction of anterior and lateral wall of LV consistent with myocardial ischemia involving left main coronary artery at termination of procedure prior to transport  Normal LV function at completion of procedure after CABG x2                  BRIEF CLINICAL NOTE AND INDICATIONS FOR SURGERY  Patient is a 55 year old male with unremarkable past medical history who has been referred for surgical consultation because of arecently small embolic stroke and was found to have a mass adherent to his aortic valve on transesophageal echocardiogram. The patient states that he was in his usual state of health until June of this year when he injured his left calf muscle. He was pushing a 250 pound generator up a hill and felt a  sudden strain behind his left knee. He developed swelling and pain that persisted for more than 10 days. He was evaluated by Dr. Margaretha Sheffield in the sports medicine clinic and diagnosed with muscular strain and possible tear of the plantaris. Swelling persisted and the patient underwent a lower extremity duplex scan that was positive for DVT. He was anticoagulated using Xarelto beginning on 08/10/2015. Approximately 2 weeks later the patient experienced an episode of severe dizziness and double vision which lasted approximately 2 minutes and resolved. He has had several brief recurrent episodes of double vision and mild dizziness over the last 2 months, but all episodes have been shorter duration and less in severity. He reported these symptoms to his primary care physician and ultimately underwent MRI of the brain on 10/06/2015 which revealed a small acute or subacute lacunar infarct in the left cerebellum with no associated mass effect or hemorrhage. A carotid duplex scan was performed andnotable for the absence of significant extracranial carotid artery disease and antegrade blood flow in the vertebral arteries. A transthoracic echocardiogram was performed demonstrating a "medium-sized, 1.4 cm" mass on the aortic valve felt suspicious for possible vegetation. There was no aortic insufficiency. There was normal left ventricular size and systolic function. There was no evidence of patent foramen ovale and no other abnormalities were noted.The patient was referred for cardiology consultation and evaluated by Dr. Herbie Baltimore. 2 sets of blood cultures were obtained and remained no growth. The patient underwent transesophageal echocardiogram on 10/22/2015. This revealed a mobile mass along the ventricular surface of aortic valve. 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 The aortic valve was tricuspid and there was trivial aortic insufficiency. No other significant  abnormalities were noted. The patient underwent diagnostic cardiac catheterization on 10/29/2015. This was notable for the absence of significant coronary artery disease. Prior to catheterization routine blood work was abnormal with severe clumping of platelets. Complete blood count was repeated a total of 3 additional times, and platelet count was estimated 65,000and 66,000 on the 2 samples where platelet count was estimated. The patient was referred for surgical consultation.  He was seen in consultation by Dr. Cyndie Chime who has found that the patient has anti-phospholipid antibody syndrome documented by the presence of significant elevation of IgG anticardiolipin and beta-2-GP-1 antibodies. He has recommended that the patient be transitioned to warfarin and aspirin for long-term anticoagulation.  He was readmitted to the hospital 11/18/2015 with transient neurologic symptoms c/w recurrent small embolic stroke despite warfarin anticoagulation.  The patient has been seen in consultation and counseled at length regarding the indications, risks and potential benefits of surgery.  All questions have been answered, and the patient provides full informed consent for the operation as described.    DETAILS OF THE OPERATIVE PROCEDURE  Preparation:  The patient is brought to the operating room on the above mentioned date and central monitoring was established by the anesthesia team including placement of Swan-Ganz catheter and radial arterial line. The patient is placed in the supine position on the operating table.  Intravenous antibiotics are administered. General endotracheal anesthesia is induced uneventfully. A Foley catheter is placed.  Baseline transesophageal echocardiogram was performed.  Findings were notable for normal left ventricular systolic function. There was mild central aortic insufficiency. On initial images the mass adherent to the ventricular surface of the aortic valve was not well seen.  With careful examination the mass could be identified but it appeared somewhat smaller than it appeared when it was imaged previously. In particular, the long frond-like mobile portion appeared to be gone but there remained a sessile mass that was clearly visualized.  The patient's chest, abdomen, both groins, and both lower extremities are prepared and draped in a sterile manner. A time out procedure is performed.   Surgical Approach:  A median sternotomy incision was performed and the pericardium is opened. The ascending aorta is normal in appearance.    Extracorporeal Cardiopulmonary Bypass and Myocardial Protection:  The ascending aorta and the right atrium are cannulated for cardiopulmonary bypass.  Adequate heparinization is verified.   A retrograde cardioplegia cannula is placed through the right atrium into the coronary sinus.  The operative field was continuously flooded with carbon dioxide gas.  The entire pre-bypass portion of the operation was notable for stable hemodynamics.  Cardiopulmonary bypass was begun and the surface of the heart is inspected.  A left ventricular vent was placed through the right superior pulmonary vein. A cardioplegia cannula is placed in the ascending aorta.  A temperature probe was placed in the interventricular septum.  The patient is cooled to 32C systemic temperature.  The aortic cross clamp is applied and cold blood cardioplegia is delivered initially in an antegrade fashion through the aortic root.  Supplemental cardioplegia is given retrograde through the coronary sinus catheter.  Iced saline slush is applied for topical hypothermia.  The initial cardioplegic arrest is rapid with early diastolic arrest.  Repeat doses of cardioplegia are administered intermittently throughout the entire cross clamp portion of the operation through the coronary sinus catheter in order to maintain completely flat electrocardiogram and septal myocardial temperature below  15C.  Myocardial protection was felt to be excellent.   Excision of Aortic Valve Mass:  An oblique transverse aortotomy incision was performed.  The aortic valve is exposed and examined. The aortic surface of the valve appeared normal. The aortic valve was trileaflet. There was a friable sessile mass located just beneath the valve adherent to the ventricular surface of the leaflets immediately beneath the commissure between the left and noncoronary leaflets of the valve. On gross inspection the valve appeared similar to a vegetation with particulate debris and some component of organized clot. Portions of the vegetation are sent for stat Gram stain, routine culture, fungal culture, and AFB culture. The mass is then carefully shaved off of the undersurface of the valve using a 15 blade knife. The mass has dense fibrotic component adherent to the undersurface of the valve and the tissue immediately below the annulus. The masses shaved off in 1 piece and sent to pathology for histology. Preliminary Gram stain reveals no organisms seen with numerous white blood cells including mostly PMNs.   Aortic Valve Repair:  A pulse lavage irrigator is utilized to irrigate the valve, the aortic root, and the ventricle.  The valve was carefully inspected. There are fenestrations noted in the tips of the leaflets of both the left and the noncoronary leaflet at the apex of the commissure between the left and the noncoronary leaflets. The valve itself appears intact and potentially repairable. The fenestrations are closed using running 7-0 Prolene suture. The commissure between the left and the noncoronary leaflets is then plicated using a pair of pledgeted horizontal mattress 4-0 Prolene sutures. The valve is tested with saline and appears competent.  The aortotomy was closed using a 2-layer closure of running 4-0 Prolene suture.  One final dose of warm retrograde "hot shot" cardioplegia was administered retrograde  through the coronary sinus catheter while all air was evacuated through the aortic root.  The aortic cross clamp was removed after a total cross clamp time of 64 minutes.  Epicardial pacing wires are fixed to the right ventricular outflow tract and to the right atrial appendage. The patient is rewarmed to 37C temperature. The aortic and left ventricular vents are removed.  The patient is weaned and disconnected from cardiopulmonary bypass.  The patient's rhythm at separation from bypass was sinus.  The patient was weaned from cardioplegic bypass without any inotropic support. Total cardiopulmonary bypass time for the operation was 86 minutes.  Followup transesophageal echocardiogram performed after separation from bypass revealed increased central aortic insufficiency.  The jet of aortic insufficiency with central but noticeably more severe than it was preoperatively. Severity was estimated between 2+ and 3+. The patient was evaluated intraoperatively by Dr. Eden Emms. Attempts to quantify severity of aortic insufficiency were unsuccessful.  It was felt that the aortic insufficiency was significantly worse than preoperative and likely to be associated with inadequate long-term outcome. A decision was made to proceed with valve replacement.   Aortic Valve Replacement:  Cardiopulmonary bypass was resumed.  A left ventricular vent was replaced through the right superior pulmonary vein. A retrograde cardioplegia cannula was replaced into the coronary sinus. A cardioplegia cannula is placed in the ascending aorta.  A temperature probe was placed in the interventricular septum.  The patient is cooled to 32C systemic temperature.  The aortic cross clamp is applied and cold blood cardioplegia is delivered initially in an antegrade fashion through the aortic root.  Supplemental cardioplegia is given retrograde through the coronary sinus catheter.  Iced  saline slush is applied for topical hypothermia.  The initial  cardioplegic arrest is rapid with early diastolic arrest.  Repeat doses of cardioplegia are administered intermittently throughout the entire cross clamp portion of the operation through the coronary sinus catheter in order to maintain completely flat electrocardiogram and septal myocardial temperature below 15C.  Myocardial protection was felt to be excellent.  The aortotomy incision was reopened.   The aortic valve leaflets were excised sharply.  The aortic annulus was sized to accept a 25 mm prosthesis.  The aortic root and left ventricle were irrigated with copious cold saline solution.  The patient's left main coronary artery was notably anomalous and arose immediately above the aortic valve annulus at the commissure between the left and noncoronary leaflet. Because of this an effort was made to place the valve sutures somewhat low across this commissure to avoid possibility of compromise of the left main coronary artery.  Aortic valve replacement was performed using interrupted horizontal mattress 2-0 Ethibond pledgeted sutures with pledgets in the subannular position.  An [facility] Ease pericardial tissue valve (size 25 mm, model # 3300TFX, serial # [id number]) was implanted uneventfully. All sutures were secured using a Cor-knot device.  The valve seated appropriately with adequate space beneath the left main and right coronary artery, although the distance between the left main coronary artery and the valve sewing ring was fairly tight but felt to be adequate.  The aortotomy was closed using a 2-layer closure of running 4-0 Prolene suture.  One final dose of warm retrograde "hot shot" cardioplegia was administered retrograde through the coronary sinus catheter while all air was evacuated through the aortic root.  The aortic cross clamp was removed after a second cross clamp time of 61 minutes.  Epicardial pacing wires are fixed to the right ventricular outflow tract and to the right atrial  appendage. The patient is rewarmed to 37C temperature. The aortic and left ventricular vents are removed.  The patient is weaned and disconnected from cardiopulmonary bypass.  The patient's rhythm at separation from bypass was sinus.  The patient was weaned from cardiopulmonary bypass without any inotropic support. Total cardiopulmonary bypass time for the second operation was 81 minutes.  Followup transesophageal echocardiogram performed after separation from bypass revealed a well-seated aortic valve prosthesis that was functioning normally and without any sign of perivalvular leak.  Left ventricular function was unchanged from preoperatively.  The aortic and venous cannula were removed uneventfully. Protamine was administered to reverse the anticoagulation. The mediastinum and pleural space were inspected for hemostasis and irrigated with saline solution. The mediastinum was drained using 2 chest tubes placed through separate stab incisions inferiorly.  The soft tissues anterior to the aorta were reapproximated loosely. The sternum is closed with double strength sternal wire. The soft tissues anterior to the sternum were closed in multiple layers and the skin is closed with a running subcuticular skin closure.  The patient remained remarkably stable throughout this entire portion of the operation. Preparations were made to transport the patient to the surgical intensive care unit. However, immediately prior to moving the patient off of the operating table to a bed, the patient developed sudden onset hypotension. Pulmonary artery pressures increased. EKG revealed diffuse ST segment elevation. The transesophageal echo probe was replaced and TEE revealed severe akinesis of the anterior wall and lateral wall. The inferior wall and right ventricle were moving normally. The patient's hemodynamic status continued to deteriorate fairly rapidly.  Compromise of the origin of the left  main coronary artery was  suspected.  The patient's chest, abdomen, both groins, and both lower extremities are prepared and draped in a sterile manner.  The patient is heparinized systemically. The median sternotomy incision was reopened. Simultaneously, the greater saphenous vein is obtained from the patient's right thigh using endoscopic vein harvest technique. The saphenous vein is notably good quality conduit. After removal of the saphenous vein, the small surgical incisions in the lower extremity are closed with absorbable suture.  The ascending aorta and the right atrium are cannulated for cardiopulmonary bypass.  Cardiopulmonary bypass was begun. A cardioplegia cannula is placed in the ascending aorta.  A retrograde cardioplegia cannula is placed into the coronary sinus. A temperature probe was placed in the interventricular septum.  The patient is allowed to cool passively to [facility]32[facility] systemic temperature.  The aortic cross clamp is applied and cold blood cardioplegia is delivered initially in an antegrade fashion through the aortic root.   Supplemental cardioplegia is given retrograde through the coronary sinus catheter.  Iced saline slush is applied for topical hypothermia.  The initial cardioplegic arrest is rapid with early diastolic arrest.  Repeat doses of cardioplegia are administered intermittently throughout the entire cross clamp portion of the operation through the aortic root,  through the coronary sinus catheter, and through subsequently placed vein grafts in order to maintain completely flat electrocardiogram and septal myocardial temperature below 15C.  Myocardial protection was felt to be excellent.   Coronary Artery Bypass Grafting:   The obtuse marginal branch of the left circumflex coronary artery was grafted using a reversed greater saphenous vein graft in an end-to-side fashion.  At the site of distal anastomosis the target vessel was good quality and measured approximately 2.0 mm in diameter.  The  distal left anterior coronary artery was grafted with a reversed greater saphenous vein graft in an end-to-side fashion.  At the site of distal anastomosis the target vessel was good quality and measured approximately 2.0 mm in diameter.  Both proximal vein graft anastomoses were placed directly to the ascending aorta prior to removal of the aortic cross clamp.  The aortic cross clamp was removed after a total third cross clamp time of 45 minutes, such that the grand total for all 3 procedures was 170 minutes.   Procedure Completion:  All proximal and distal coronary anastomoses were inspected for hemostasis and appropriate graft orientation. The patient is rewarmed to 37C temperature. The patient is weaned and disconnected from cardiopulmonary bypass.  The patient's rhythm at separation from bypass was sinus.  The patient was weaned from cardiopulmonary bypass on low dose milrinone and dopamine infusions. Total cardiopulmonary bypass time for the third operation was 76 minutes sessions that the grand total for all 3 procedures was 243 minutes.  Followup transesophageal echocardiogram performed after separation from bypass revealed normal LV systolic function.  The bioprosthetic tissue valve in aortic position was functioning normally. There was no aortic insufficiency.  The aortic and venous cannula were removed uneventfully. Protamine was administered to reverse the anticoagulation. The mediastinum and pleural space were inspected for hemostasis and irrigated with saline solution.  The soft tissues anterior to the aorta were reapproximated loosely. The sternum is closed with double strength sternal wire. The soft tissues anterior to the sternum were closed in multiple layers and the skin is closed with a running subcuticular skin closure.  The post-bypass portion of the operation was notable for stable rhythm and hemodynamics.  No blood products were administered during the  operation.  Disposition:  The patient tolerated the procedure well and is transported to the surgical intensive care in stable condition. There are no intraoperative complications. All sponge instrument and needle counts are verified correct at completion of the operation.    Salvatore Decent. Cornelius Moras MD 11/24/2015

## 2015-11-24 NOTE — Progress Notes (Signed)
  Echocardiogram Echocardiogram Transesophageal has been performed.  Janalyn HarderWest, Margery Szostak R 11/24/2015, 11:50 AM

## 2015-11-24 NOTE — OR Nursing (Signed)
1648 SICU  Given second second call.

## 2015-11-24 NOTE — Progress Notes (Signed)
CT surgery PM rounds  Recovering after AVR CABG Stable hemodynamics CXR clear Postop care

## 2015-11-24 NOTE — Transfer of Care (Signed)
Immediate Anesthesia Transfer of Care Note  Patient[facility]  Procedure(s) Performed: Procedure(s): EXCISION OF AORTIC VALVE MASS  (N/A) AORTIC VALVE REPLACEMENT (N/A) TRANSESOPHAGEAL ECHOCARDIOGRAM (TEE) (N/A) CORONARY ARTERY BYPASS GRAFTING (CABG)x 2 WITH ENDOSCOPIC HARVESTING OF RIGHT SAPHENOUS VEIN -SVG to LAD -SVG to OM1 (N/A)  Patient Location: SICU  Anesthesia Type:General  Level of Consciousness: Patient remains intubated per anesthesia plan  Airway & Oxygen Therapy: Patient remains intubated per anesthesia plan and Patient placed on Ventilator (see vital sign flow sheet for setting)  Post-op Assessment: Report given to RN and Post -op Vital signs reviewed and stable  Post vital signs: Reviewed and stable  Last Vitals:  Vitals:   11/24/15 1725 11/24/15 1740  BP: 98/66   Pulse: 94 85  Resp: 13 12  Temp: 37.1 C 37 C    Last Pain:  Vitals:   11/24/15 0636  TempSrc: Oral         Complications: No apparent anesthesia complications

## 2015-11-24 NOTE — OR Nursing (Signed)
1624 SICU called and given update per A. Hegarty, Charity fundraiserN.

## 2015-11-24 NOTE — H&P (Signed)
[address]       [name] [id number]             [phone]          CARDIOTHORACIC SURGERY HISTORY AND PHYSICAL EXAM  Referring Provider is Pricilla Riffleoss, Paula V, MD  Primary Cardiologist is Marykay LexHarding, David W, MD PCP is Lovenia KimHEPLER,MARK, PA-C      Chief Complaint  Patient presents with  . 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  The patient states that he was in his usual state of health until June of this year when he injured his left calf muscle.  He was pushing a 250 pound generator up a hill and felt a sudden strain behind his left knee. He developed swelling and pain that persisted for more than 10 days.  He was evaluated by Dr. Margaretha Sheffieldraper in the sports medicine clinic and diagnosed with muscular strain and possible tear of the plantaris. Swelling persisted and the patient underwent a lower extremity duplex scan that was positive for DVT.  He was anticoagulated using Xarelto beginning on 08/10/2015. Approximately 2 weeks later the patient experienced an episode of severe dizziness and double vision which lasted approximately 2 minutes and resolved.  He has had several brief recurrent episodes of double vision and mild dizziness over the last 2 months, but all episodes have been shorter duration and less in severity. He reported these symptoms to his primary care physician and ultimately underwent MRI of the brain on 10/06/2015 which revealed a small acute or subacute lacunar infarct in the left cerebellum with no associated mass effect or hemorrhage. A carotid duplex scan was performed and notable for the absence of significant extracranial carotid artery disease and  antegrade blood flow in the vertebral arteries. A transthoracic echocardiogram was performed demonstrating a "medium-sized, 1.4 cm" mass on the aortic valve felt suspicious for possible vegetation. There was no aortic insufficiency. There was normal left ventricular size and systolic function. There was no evidence of patent foramen ovale and no other abnormalities were noted. The patient was referred for cardiology consultation and evaluated by Dr. Herbie BaltimoreHarding.  2 sets of blood cultures were obtained and remained no growth. The patient underwent transesophageal echocardiogram on 10/22/2015. This revealed a mobile mass along the ventricular surface of aortic valve.  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 The aortic valve was tricuspid and there was trivial aortic insufficiency. No other significant abnormalities were noted. The patient underwent diagnostic cardiac catheterization on 10/29/2015. This was notable for the absence of significant coronary artery disease. Prior to catheterization routine blood work was abnormal with severe clumping of platelets. Complete blood count was repeated a total of 3 additional times, and platelet count was estimated 65,000 and 66,000 on the 2 samples where platelet count was estimated. The patient was referred for surgical consultation.  He has not been seen by a neurologist  The patient is married and lives [place] in [place].  He has been retired for many years, previously working as an Acupuncturistelectrical engineer. More recently he has been spending much of this time restoring automobiles. The patient states that up until this  past June he remained entirely active physically. He exercises on a regular basis and enjoys walking regularly. He reports no physical limitations prior to this summer. He continues to experience occasional very brief episodes of double vision dating back to the more significant episode of double  vision and dizziness which began in July. He states that symptoms are positional and he can oftentimes stop the double vision simply by changing the position of his head. He denies any fevers, chills, or night sweats. He reports normal appetite. He has a long history of arthritis and arthralgias. He states that despite the fact that he remains anticoagulated using Xarelto his blood clots very quickly if he scratches himself or cuts himself.  His injury to the left calf has healed and his walking is now essentially back to normal.  Patient returns to the office today for follow-up of the small somewhat mobile-appearing mass noted on the ventricular surface of the aortic valve on previous transthoracic and transesophageal echocardiograms. He was originally seen in consultation on 11/03/2015. Since then he was seen in consultation by Dr. Cyndie Chime who has found that the patient has anti-phospholipid antibody syndrome documented by the presence of significant elevation of IgG anticardiolipin and beta-2-GP-1 antibodies. He has recommended that the patient be transitioned to warfarin and aspirin for long-term anticoagulation. The patient has also undergone cardiac gated CT angiogram of the heart to further evaluate the anatomical characteristics associated with this mass, and the patient returns to our office today for follow-up. He has not yet started taking warfarin and currently remains anticoagulated using Xarelto.  He reports no new problems or complaints since his last office visit.  Patient is well known to me from recent outpatient consultation.  He was last seen in our office on 11/11/2015 at which time we made tentative plans for surgery.  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  Given recurrent embolic events it makes sense to proceed sooner rather than later.      Past Medical History:  Diagnosis Date  . Acute  cerebral infarction (HCC) 11/18/2015   Bifrontal embolic infarcts MRI/MRA 11/18/15; known aortic valve vegetation on lovenox/coumadin/ASA  . Antiphospholipid antibody syndrome (HCC) 11/06/2015   11/04/15 significant elevation of IgG anticardiolipin & beta-2-GP-1 antibodies  . 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 Aortic valve mass   . 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 DVT (deep venous thrombosis) (HCC)    left leg, on Xarelto  . 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 Thrombocytopenia (HCC)     Past Surgical History:  Procedure Laterality Date  . CARDIAC CATHETERIZATION N/A 10/29/2015   Procedure: Coronary/Graft Angiography;  Surgeon: Kathleene Hazel, MD;  Location: [facility] INVASIVE CV LAB;  Service: Cardiovascular;  Laterality: N/A;  . HERNIA REPAIR Bilateral   . MOUTH SURGERY     root canal  . TEE WITHOUT CARDIOVERSION N/A 10/22/2015   Procedure: TRANSESOPHAGEAL ECHOCARDIOGRAM (TEE);  Surgeon: Pricilla Riffle, MD;  Location: [facility] ENDOSCOPY;  Service: Cardiovascular;  Laterality: N/A;  . TRANSTHORACIC ECHOCARDIOGRAM  10/09/2015   Mild LVH. EF 60-65%. Pseudo-normal grade 2 diastolic dysfunction. 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 TEE recommended.  Family History  Problem Relation Age of Onset  . Heart disease Father   . Hypertension Father   . Heart disease Maternal Grandfather   . Hypertension Maternal Grandfather   . Diabetes Paternal Grandfather   . Heart disease Paternal Grandfather     Social History Social History  Substance Use Topics  . Smoking status: Never Smoker  . Smokeless tobacco: Never Used  . Alcohol use No     Comment: rarely    Prior to Admission medications   Medication Sig  Start Date End Date Taking? Authorizing Provider  acetaminophen (TYLENOL) 500 MG tablet Take 1,000 mg by mouth every 6 (six) hours as needed for mild pain or headache.   Yes Historical Provider, MD  aspirin EC 81 MG tablet Take 1 tablet (81 mg total) by mouth daily. Patient states he has taken aspirin in the past without getting a reaction 11/04/15 11/03/16 Yes Levert Feinstein, MD  HYDROcodone-acetaminophen (NORCO/VICODIN) 5-325 MG tablet Take 1 tablet by mouth at bedtime as needed for moderate pain.   Yes Historical Provider, MD  Multiple Vitamin (MULTIVITAMIN) tablet Take 1 tablet by mouth daily.   Yes Historical Provider, MD  psyllium (METAMUCIL) 58.6 % packet Take 1 packet by mouth daily.   Yes Historical Provider, MD  Tetrahydrozoline HCl (VISINE OP) Apply 1 drop to eye daily as needed (dry eyes).   Yes Historical Provider, MD  enoxaparin (LOVENOX) 120 MG/0.8ML injection Inject 0.6 mLs (90 mg total) into the skin every 12 (twelve) hours. Patient not taking: Reported on 11/23/2015 11/20/15   Arnetha Courser, MD    Allergies  Allergen Reactions  . Naproxen Anaphylaxis     Review of Systems:              General:                      normal appetite, normal energy, no weight gain, no weight loss, no fever             Cardiac:                       no chest pain with exertion, no chest pain at rest, noSOB with exertion, no resting SOB, no PND, no orthopnea, no palpitations, no arrhythmia, no atrial fibrillation, no LE edema, no dizzy spells, no syncope             Respiratory:                 no shortness of breath, no home oxygen, no productive cough, no dry cough, no bronchitis, no wheezing, no hemoptysis, no asthma, no pain with inspiration or cough, no sleep apnea, no CPAP at night             GI:                               no difficulty swallowing, no reflux, no frequent heartburn, no hiatal hernia, no abdominal pain, no constipation, no diarrhea, no hematochezia, no hematemesis, no  melena             GU:                              no dysuria,  no frequency, no urinary tract infection, no hematuria, no enlarged prostate, no kidney stones, no kidney disease  Vascular:                     no pain suggestive of claudication, no pain in feet, no leg cramps, no varicose veins, no DVT, no non-healing foot ulcer             Neuro:                         + stroke, + TIA's, no seizures, no headaches, no temporary blindness one eye,  no slurred speech, no peripheral neuropathy, no chronic pain, no instability of gait, no memory/cognitive dysfunction             Musculoskeletal:         + arthritis, no joint swelling, no myalgias, no difficulty walking, normal mobility              Skin:                            no rash, no itching, no skin infections, no pressure sores or ulcerations             Psych:                         no anxiety, no depression, no nervousness, no unusual recent stress             Eyes:                           + blurry vision, no floaters, + recent vision changes, does not wear glasses or contacts             ENT:                            no hearing loss, no loose or painful teeth, no dentures             Hematologic:               no easy bruising, ? abnormal bleeding, ? clotting disorder, no frequent epistaxis             Endocrine:                   no diabetes, does not check CBG's at home                           Physical Exam:              BP 131/81   Pulse 61   Resp 20   Ht 5\' 8"  (1.727 m)   Wt 190 lb (86.2 kg)   SpO2 98% Comment: RA  BMI 28.89 kg/m              General:                        well-appearing             HEENT:                       Unremarkable              Neck:  no JVD, no bruits, no adenopathy              Chest:                          clear to auscultation, symmetrical breath sounds, no wheezes, no rhonchi              CV:                              RRR, no murmur               Abdomen:                    soft, non-tender, no masses              Extremities:                 warm, well-perfused, pulses palpable, no LE edema             Rectal/GU                   Deferred             Neuro:                         Grossly non-focal and symmetrical throughout             Skin:                            Clean and dry, no rashes, no breakdown   Diagnostic Tests:  MRI HEAD WITHOUT AND WITH CONTRAST  TECHNIQUE: Multiplanar, multiecho pulse sequences of the brain and surrounding structures were obtained without and with intravenous contrast.  CONTRAST: 17mL MULTIHANCE GADOBENATE DIMEGLUMINE 529 MG/ML IV SOLN  COMPARISON: None.  FINDINGS: There is a small 5-6 mm focus of mildly restricted diffusion in the left superior cerebellum (series 100, image 16). Mild associated T2 and FLAIR hyperintensity.  Major intracranial vascular flow voids are preserved. No other diffusion abnormality.  Elsewhere normal for age gray and white matter signal throughout the brain. No chronic cortical encephalomalacia or chronic cerebral blood products. No abnormal enhancement identified. No dural thickening. Normal cavernous sinus. Bilateral orbits soft tissues appear normal.  No midline shift, mass effect, evidence of mass lesion, ventriculomegaly, extra-axial collection or acute intracranial hemorrhage. Cervicomedullary junction and pituitary are within normal limits. Negative visualized cervical spine. Visible internal auditory structures appear normal. Mastoids are clear. Mild to moderate ethmoid and sphenoid sinus mucosal thickening. Negative scalp soft tissues. Visualized bone marrow signal is within normal limits.  IMPRESSION: 1. There is a small acute to subacute lacunar infarct in the left cerebellum. No associated mass effect or hemorrhage. 2. Otherwise the MRI appearance of the brain is normal for age.  Electronically Signed: By: Odessa Fleming  M.D. On: 10/06/2015 11:32   Transthoracic Echocardiography  Patient: Cambren, Helm MR #: [id number] Study Date: 10/09/2015 Gender: M Age: 58 Height: 172.7 cm Weight: 82.6 kg BSA: 2.01 m^2 Pt. Status: Room:  ATTENDING Rollene Rotunda, MD SONOGRAPHER Aida Raider, RDCS ORDERING Lovenia Kim [address], [place] [id number] PERFORMING [facility] Outpatient  cc:  ------------------------------------------------------------------- LV EF: 60% - 65%  ------------------------------------------------------------------- Indications: Z86.73 History of CVA. BUBBLE STUDY.  ------------------------------------------------------------------- History: PMH: Acquired from the patient and  from the patient&'s chart.  ------------------------------------------------------------------- Study Conclusions  - Left ventricle: The cavity size was normal. Wall thickness was increased in a pattern of mild LVH. Systolic function was normal. The estimated ejection fraction was in the range of 60% to 65%. Wall motion was normal; there were no regional wall motion abnormalities. Features are consistent with a pseudonormal left ventricular filling pattern, with concomitant abnormal relaxation and increased filling pressure (grade 2 diastolic dysfunction). - Aortic valve: There was a medium-sized, 1.4 cm (L) x 0.6 cm (W) vegetation on the left ventricular aspect. - Atrial septum: No defect or patent foramen ovale was identified.  Impressions:  - There is a medium sized mass on the aortic valve that is c/w a vegetation. Consider TEE for further evaluation if clinically indicated.  ------------------------------------------------------------------- Study data: Study status: Routine. Procedure: The patient reported no pain pre or post test. Transthoracic echocardiography for left  ventricular function evaluation, for right ventricular function evaluation, and for assessment of valvular function. Image quality was adequate. Intravenous contrast (agitated saline) was administered to identify shunting. Study completion: There were no complications. Transthoracic echocardiography. M-mode, complete 2D, spectral Doppler, and color Doppler. Birthdate: Patient birthdate: Nov 01, 1960. Age: Patient is 55 yr old. Sex: Gender: male. BMI: 27.7 kg/m^2. Blood pressure: 124/78 Patient status: Outpatient. Study date: Study date: 10/09/2015. Study time: 09:29 AM. Location: [facility]  -------------------------------------------------------------------  ------------------------------------------------------------------- Left ventricle: The cavity size was normal. Wall thickness was increased in a pattern of mild LVH. Systolic function was normal. The estimated ejection fraction was in the range of 60% to 65%. Wall motion was normal; there were no regional wall motion abnormalities. Features are consistent with a pseudonormal left ventricular filling pattern, with concomitant abnormal relaxation and increased filling pressure (grade 2 diastolic dysfunction).  ------------------------------------------------------------------- Aortic valve: Mildly thickened leaflets. There was a medium-sized, 1.4 cm (L) x 0.6 cm (W) vegetation on the left ventricular aspect. Doppler: There was no stenosis. There was no regurgitation.  ------------------------------------------------------------------- Aorta: The aorta was normal, not dilated, and non-diseased. Aortic root: The aortic root was normal in size. Ascending aorta: The ascending aorta was normal in size.  ------------------------------------------------------------------- Mitral valve: Mildly thickened leaflets . Doppler: There was no evidence for stenosis. There was trivial  regurgitation. Peak gradient (D): 5 mm Hg.  ------------------------------------------------------------------- Left atrium: The atrium was normal in size.  ------------------------------------------------------------------- Atrial septum: Bubble contrast was given. There was no evidence of PFO or ASD No defect or patent foramen ovale was identified.  ------------------------------------------------------------------- Right ventricle: The cavity size was normal. Systolic function was normal.  ------------------------------------------------------------------- Pulmonic valve: Structurally normal valve. Cusp separation was normal. Doppler: Transvalvular velocity was within the normal range. There was no regurgitation.  ------------------------------------------------------------------- Tricuspid valve: The valve appears to be grossly normal. Doppler: There was mild regurgitation.  ------------------------------------------------------------------- Right atrium: The atrium was normal in size.  ------------------------------------------------------------------- Pericardium: There was no pericardial effusion.  ------------------------------------------------------------------- Systemic veins: Inferior vena cava: The vessel was normal in size. The respirophasic diameter changes were in the normal range (>= 50%), consistent with normal central venous pressure.  ------------------------------------------------------------------- Post procedure conclusions Ascending Aorta:  - The aorta was normal, not dilated, and non-diseased.  ------------------------------------------------------------------- Measurements  Left ventricle Value Reference LV ID, ED, PLAX chordal 44.5 mm 43 - 52 LV ID, ES, PLAX chordal 25.6 mm 23 - 38 LV fx shortening, PLAX chordal 42 % >=29 LV  PW thickness, ED 11.3 mm --------- IVS/LV PW ratio, ED 1 <=1.3 Stroke volume, 2D 89 ml ---------  Stroke volume/bsa, 2D 44 ml/m^2 --------- LV e&', lateral 10.1 cm/s --------- LV E/e&', lateral 10.89 --------- LV e&', medial 8.99 cm/s --------- LV E/e&', medial 12.24 --------- LV e&', average 9.55 cm/s --------- LV E/e&', average 11.52 ---------  Ventricular septum Value Reference IVS thickness, ED 11.3 mm ---------  LVOT Value Reference LVOT ID, S 22 mm --------- LVOT area 3.8 cm^2 --------- LVOT ID 22 mm --------- LVOT peak velocity, S 97.8 cm/s --------- LVOT mean velocity, S 63 cm/s --------- LVOT VTI, S 23.5 cm --------- LVOT peak gradient, S 4 mm Hg --------- Stroke volume (SV), LVOT DP 89.3 ml --------- Stroke index (SV/bsa), LVOT DP 44.5 ml/m^2 ---------  Aorta Value Reference Aortic root ID, ED 36 mm ---------  Left atrium Value Reference LA ID, A-P, ES 37 mm --------- LA ID/bsa, A-P 1.84 cm/m^2 <=2.2 LA volume, S 54 ml --------- LA volume/bsa, S 26.9 ml/m^2 --------- LA volume, ES, 1-p A4C 53 ml --------- LA volume/bsa, ES, 1-p  A4C 26.4 ml/m^2 --------- LA volume, ES, 1-p A2C 53 ml --------- LA volume/bsa, ES, 1-p A2C 26.4 ml/m^2 ---------  Mitral valve Value Reference Mitral E-wave peak velocity 110 cm/s --------- Mitral A-wave peak velocity 99.3 cm/s --------- Mitral deceleration time 222 ms 150 - 230 Mitral peak gradient, D 5 mm Hg --------- Mitral E/A ratio, peak 1.1 ---------  Tricuspid valve Value Reference Tricuspid regurg peak velocity 255 cm/s --------- Tricuspid peak RV-RA gradient 26 mm Hg ---------  Right ventricle Value Reference RV s&', lateral, S 11.8 cm/s ---------  Legend: (L) and (H) mark values outside specified reference range.  ------------------------------------------------------------------- Prepared and Electronically Authenticated by  Kristeen Miss, M.D. 2017-09-08T11:45:16   Transesophageal Echocardiography  Patient: Mancil, Pfenning MR #: [id number] Study Date: 10/22/2015 Gender: M Age: 55 Height: 172.7 cm Weight: 86.8 kg BSA: 2.06 m^2 Pt. Status: Room:  ADMITTING Dietrich Pates, M.D. ATTENDING Dietrich Pates, M.D. ORDERING Dietrich Pates, M.D. PERFORMING Dietrich Pates, M.D. REFERRING Dietrich Pates, M.D. SONOGRAPHER Sheralyn Boatman  cc:  -------------------------------------------------------------------  ------------------------------------------------------------------- Indications: 424.1 Aortic valve disorders.  ------------------------------------------------------------------- History: PMH: Stroke.  ------------------------------------------------------------------- Study Conclusions  -  Aortic valve: Large mobile mass along ventricular surface of AV Measures 2 x 1 cm. Surface is very irregular (frondlike) Appears to be attached to noncoronary cusp, near base. Base of this cusp is thickened. Suspicious for fibroelastoma. Mild AI. - Left atrium: No evidence of thrombus in the atrial cavity or appendage.  ------------------------------------------------------------------- Study data: Study status: Routine. Consent: The risks, benefits, and alternatives to the procedure were explained to the patient and informed consent was obtained. Procedure: Initial setup. The patient was brought to the laboratory. Surface ECG leads were monitored. Sedation. Conscious sedation was administered by cardiology staff. Transesophageal echocardiography. Topical anesthesia was obtained using viscous lidocaine. An adult multiplane transesophageal probe was inserted by the attending cardiologistwithout difficulty. Image quality was adequate. Study completion: The patient tolerated the procedure well. There were no complications. Administered medications: Fentanyl, 67.40mcg, IV. Midazolam, 7mg , IV. Diagnostic transesophageal echocardiography. 2D and color Doppler. Birthdate: Patient birthdate: 07-27-1960. Age: Patient is 55 yr old. Sex: Gender: male. BMI: 29.1 kg/m^2. Blood pressure: 136/87 Patient status: Inpatient. Study date: Study date: 10/22/2015. Study time: 08:08 AM. Location: Endoscopy.  -------------------------------------------------------------------  ------------------------------------------------------------------- Left ventricle: LVEF is normal.  ------------------------------------------------------------------- Aortic valve: Large mobile mass along ventricular surface of AV Measures 2 x 1 cm. Surface is very irregular (frondlike) Appears to be attached to noncoronary cusp, near base. Base of this cusp is thickened.  Suspicious for fibroelastoma. Mild  AI.  ------------------------------------------------------------------- Aorta: Normal thoracic aorta  ------------------------------------------------------------------- Mitral valve: MV is normal Trace MR.  ------------------------------------------------------------------- Left atrium: No evidence of thrombus in the atrial cavity or appendage.  ------------------------------------------------------------------- Atrial septum: NO PFO as tested with injection of agitated saline.  ------------------------------------------------------------------- Pulmonic valve: PV is normal . Trace PI.  ------------------------------------------------------------------- Tricuspid valve: TV is normal MIld TR.  ------------------------------------------------------------------- Post procedure conclusions Ascending Aorta:  - Normal thoracic aorta  ------------------------------------------------------------------- Prepared and Electronically Authenticated by  Dietrich Pates, M.D. 2017-09-22T08:20:49   Coronary/Graft Angiography  Conclusion   1. No angiographic evidence of CAD  Recommendations: No further ischemic workup.   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 Cardiac cath indicated for pre-operative exclusion of CAD.   Procedure: The risks, benefits, complications, treatment options, and expected outcomes were discussed with the patient. The patient and/or family concurred with the proposed plan, giving informed consent. The patient was brought to the cath lab after IV hydration was begun and oral premedication was given. The patient was further sedated with Versed and Fentanyl. The right wrist was assessed with a modified Allens test which was positive. The right wrist was prepped and draped in a sterile fashion. 1%  lidocaine was used for local anesthesia. Using the modified Seldinger access technique, a 5 French sheath was placed in the right radial artery. 3 mg Verapamil was given through the sheath. 4500 units IV heparin was given. Standard diagnostic catheters were used to perform selective coronary angiography. I did not cross the aortic valve. The sheath was removed from the right radial artery and a Terumo hemostasis band was applied at the arteriotomy site on the right wrist.      Estimated blood loss <50 mL. . During this procedure the patient was administered the following to achieve and maintain moderate conscious sedation: Versed 3 mg, Fentanyl 75 mcg, while the patient's heart rate, blood pressure, and oxygen saturation were continuously monitored. The period of conscious sedation was 18 minutes, of which I was present face-to-face 100% of this time.    Complications   Complications documented before study signed (10/29/2015 10:07 AM EDT)   CORONARY/GRAFT ANGIOGRAPHY   None Documented by Kathleene Hazel, MD 10/29/2015 10:07 AM EDT  Time Range: Intra-procedure      Coronary Findings   Dominance: Right  Left Anterior Descending  Vessel is large.  Second Diagonal Branch  Vessel is moderate in size.  Third Diagonal Branch  Vessel is small in size.  Ramus Intermedius  Vessel is moderate in size.  Left Circumflex  Vessel is moderate in size.  Second Obtuse Marginal Branch  Vessel is small in size.  Right Coronary Artery  Vessel is large. Vessel is angiographically normal.  Right Posterior Descending Artery  Vessel is large in size.  Coronary Diagrams   Diagnostic Diagram     Implants        No implant documentation for this case.  PACS Images   Show images for Cardiac catheterization   Link to Procedure Log   Procedure Log    Hemo Data   Flowsheet Row Most Recent Value  AO Systolic Pressure 129 mmHg  AO Diastolic Pressure 77 mmHg  AO Mean 100 mmHg        Cardiac CTA  TECHNIQUE: The patient was scanned on a Philips 256 scanner. A 120 kV retrospective scan was triggered in the descending thoracic aorta at 111 HU's. Gantry  rotation speed was 270 msecs and collimation was .9 mm. No beta blockade or nitro were given. The 3D data set was reconstructed in 5% intervals of the R-R cycle. Systolic and diastolic phases were analyzed on a dedicated work station using MPR, MIP and VRT modes. The patient received 80 cc of contrast.  FINDINGS: Aortic Valve: Trileaflet. No calcifications. There is a lesion in between left and non-coronary cusp that measures 17 x 12 x 9 mm that could represents a thrombus or a fibroelastoma.  Aorta: Normal size, no calcifications, no dissection.  Sinotubular Junction: 30 x 30 mm  Ascending Thoracic Aorta: 31 x 31 mm  Aortic Arch: 28 x 27 mm  Descending Thoracic Aorta: 27 x 27 mm  Sinus of Valsalva Measurements:  Non-coronary: 36 mm  Right -coronary: 34 mm  Left -coronary: 36 mm  Coronary Arteries: The study was performed without use of NTG.  There is right dominance. Left coronary artery originates posteriorly in between left and non-coronary cusp and continues anteriorly in a usual fashion. There is no plaque in the proximal arteries.  There is normal pulmonary vein drainage into the left atrium.  Left atrial appendage has no thrombus.  Normal size of the pulmonary artery.  IMPRESSION: 1. There is a lesion in between left and non-coronary cusp that measures 17 x 12 x 9 mm that could represents a thrombus or a fibroelastoma. There is no invasion into the adjacent structures.  An aggressive anticoagulation and follow up TEE in 3-4 weeks is recommended.  2. Normal size of the thoracic aorta, no calcifications, no dissection.  3. Left coronary artery originates posteriorly in between left and non-coronary cusp and continues anteriorly in a usual  fashion. There is no plaque in the proximal arteries.  Tobias Alexander   Electronically Signed By: Tobias Alexander On: 11/10/2015 17:30    Impression:  Patient recently suffered a small embolic stroke and has a mass that is adherent to the ventricular surface of the aortic valve.  I have personally reviewed the patient's MRI of the brain, recent transthoracic and transesophageal echocardiograms, and diagnostic cardiac catheterization. The mass measures approximately 1 x 2 cm in its greatest dimensions.  It is difficult to tell for certain but the mass appears to be adherent to the ventricular surface of the aortic valve near the base of the left and non-coronary leaflets. The mass is quite mobile.  Anatomical characteristics by echocardiogram are not definitively diagnostic but could be consistent with a papillary fibro-elastoma, vegetation, or thrombus.  The aortic valve is tricuspid and appears to be functioning normally with trivial aortic insufficiency. Left ventricular size and function remains normal. The patient does not have significant coronary artery disease. Recent blood cultures were negative and the patient has no constitutional signs or symptoms to suggest a history of infectious endocarditis.  I completely agree with Dr. 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 Unfortunately, that does not come with adequate clinical data to definitively make a recommendation for treatment. With regards to the small mass noted on echocardiogram adherent to the ventricular surface of the aortic valve, the diagnosis makes perfect sense. The appearance of this mass on TEE and cardiac gated CT are most consistent with the appearance of a vegetation. In the setting of Libman-Sacks endocarditis this is not infectious but presumably an autoimmune phenomenon with the vegetation consisting of a combination of  inflammatory cells and components of clot. He only definitive means to  establish a diagnosis would be with surgical resection. Risk of recurrent embolization with or without long-term anticoagulation is not well-defined, but the patient has already had multiple recurrent events. Given the size of this lesion and the fact that surgery should come with relatively low associated risks, I favor surgical resection. Moreover, presuming this is a noninfectious vegetation surgery should come with relatively high likelihood that the aortic valve can be preserved.    Plan:  I discussed matters at length with the patient and his wife in the office today. The indications, risks, and potential benefits of surgery have been discussed in detail. The primary indications include ability to obtain a definitive diagnosis and eliminate risk for repeat embolization. Risks associated with surgery should be very low given the fact that the patient is relatively young, has normal left ventricular function, does not have significant coronary artery disease, and has relatively few comorbid medical problems. He certainly might be at risk for additional clotting or embolic events, but hopefully this should be mediated through the use of systemic anticoagulation. The likelihood that his aortic valve should be able to be preserved remained high but there remains a chance that valve repair or replacement might be necessary. Long-term risks without surgical intervention are somewhat difficult to predict. All of the patient's questions have been answered.   We now tentatively plan for OR on Tuesday 11/24/2015.  I have again reviewed the indications, risks, and potential benefits of surgery for resection of the presumed vegetation or mass adherent to the aortic valve with the patient this afternoon.  In the event that the patient's aortic valve cannot be repaired, discussion was held comparing the relative risks of mechanical valve  replacement with need for lifelong anticoagulation versus use of a bioprosthetic tissue valve and the associated potential for late structural valve deterioration and failure.  This discussion was placed in the context of the patient's particular circumstances, and as a result the patient specifically requests that their valve be replaced using a bioprosthetic tissue valve.   The patient understands and accepts all potential associated risks of surgery including but not limited to risk of death, stroke, myocardial infarction, congestive heart failure, respiratory failure, renal failure, pneumonia, bleeding requiring blood transfusion and or reexploration, arrhythmia, heart block or bradycardia requiring permanent pacemaker, aortic dissection or other major vascular complication, pleural effusions or other delayed complications related to continued congestive heart failure, and other late complications related to valve replacement including structural valve deterioration and failure, thrombosis, endocarditis, or paravalvular leak.  For surgery next Tuesday. Patient has been instructed to take his last dose of Lovenox on Monday morning.      Purcell Nails, MD 11/20/2015 2:30pm

## 2015-11-24 NOTE — OR Nursing (Signed)
1314 SICU given first call.

## 2015-11-24 NOTE — OR Nursing (Signed)
1325 SICU given second call.

## 2015-11-24 NOTE — OR Nursing (Signed)
1430 SICU called and made aware pt's chest is being reopened and will not be rolling up at this time.

## 2015-11-25 ENCOUNTER — Encounter (HOSPITAL_COMMUNITY): Payer: Self-pay | Admitting: Thoracic Surgery (Cardiothoracic Vascular Surgery)

## 2015-11-25 ENCOUNTER — Telehealth: Payer: Self-pay | Admitting: *Deleted

## 2015-11-25 ENCOUNTER — Inpatient Hospital Stay (HOSPITAL_COMMUNITY): Payer: BLUE CROSS/BLUE SHIELD

## 2015-11-25 DIAGNOSIS — D6861 Antiphospholipid syndrome: Secondary | ICD-10-CM

## 2015-11-25 DIAGNOSIS — I663 Occlusion and stenosis of cerebellar arteries: Secondary | ICD-10-CM

## 2015-11-25 DIAGNOSIS — D696 Thrombocytopenia, unspecified: Secondary | ICD-10-CM

## 2015-11-25 DIAGNOSIS — I82402 Acute embolism and thrombosis of unspecified deep veins of left lower extremity: Secondary | ICD-10-CM

## 2015-11-25 DIAGNOSIS — Z8673 Personal history of transient ischemic attack (TIA), and cerebral infarction without residual deficits: Secondary | ICD-10-CM

## 2015-11-25 DIAGNOSIS — Z952 Presence of prosthetic heart valve: Secondary | ICD-10-CM

## 2015-11-25 DIAGNOSIS — M3211 Endocarditis in systemic lupus erythematosus: Secondary | ICD-10-CM

## 2015-11-25 LAB — CBC
HCT: 24.2 % — ABNORMAL LOW (ref 39.0–52.0)
HEMATOCRIT: 25.1 % — AB (ref 39.0–52.0)
HEMOGLOBIN: 8.6 g/dL — AB (ref 13.0–17.0)
Hemoglobin: 8.8 g/dL — ABNORMAL LOW (ref 13.0–17.0)
MCH: 29.2 pg (ref 26.0–34.0)
MCH: 29.6 pg (ref 26.0–34.0)
MCHC: 35.5 g/dL (ref 30.0–36.0)
MCHC: 35.1 g/dL (ref 30.0–36.0)
MCV: 83.2 fL (ref 78.0–100.0)
MCV: 83.4 fL (ref 78.0–100.0)
Platelets: 58 10*3/uL — ABNORMAL LOW (ref 150–400)
Platelets: 61 10*3/uL — ABNORMAL LOW (ref 150–400)
RBC: 3.01 MIL/uL — ABNORMAL LOW (ref 4.22–5.81)
RBC: 2.91 MIL/uL — AB (ref 4.22–5.81)
RDW: 13 % (ref 11.5–15.5)
RDW: 12.8 % (ref 11.5–15.5)
WBC: 9.3 10*3/uL (ref 4.0–10.5)
WBC: 9.8 10*3/uL (ref 4.0–10.5)

## 2015-11-25 LAB — GLUCOSE, CAPILLARY
GLUCOSE-CAPILLARY: 106 mg/dL — AB (ref 65–99)
GLUCOSE-CAPILLARY: 109 mg/dL — AB (ref 65–99)
GLUCOSE-CAPILLARY: 109 mg/dL — AB (ref 65–99)
GLUCOSE-CAPILLARY: 110 mg/dL — AB (ref 65–99)
GLUCOSE-CAPILLARY: 116 mg/dL — AB (ref 65–99)
GLUCOSE-CAPILLARY: 119 mg/dL — AB (ref 65–99)
GLUCOSE-CAPILLARY: 86 mg/dL (ref 65–99)
GLUCOSE-CAPILLARY: 95 mg/dL (ref 65–99)
Glucose-Capillary: 104 mg/dL — ABNORMAL HIGH (ref 65–99)
Glucose-Capillary: 106 mg/dL — ABNORMAL HIGH (ref 65–99)
Glucose-Capillary: 121 mg/dL — ABNORMAL HIGH (ref 65–99)
Glucose-Capillary: 107 mg/dL — ABNORMAL HIGH (ref 65–99)
Glucose-Capillary: 148 mg/dL — ABNORMAL HIGH (ref 65–99)
Glucose-Capillary: 144 mg/dL — ABNORMAL HIGH (ref 65–99)
Glucose-Capillary: 143 mg/dL — ABNORMAL HIGH (ref 65–99)

## 2015-11-25 LAB — BASIC METABOLIC PANEL
ANION GAP: 5 (ref 5–15)
BUN: 12 mg/dL (ref 6–20)
CHLORIDE: 110 mmol/L (ref 101–111)
CO2: 25 mmol/L (ref 22–32)
CREATININE: 1.2 mg/dL (ref 0.61–1.24)
Calcium: 8.1 mg/dL — ABNORMAL LOW (ref 8.9–10.3)
GFR calc non Af Amer: >60 mL/min (ref >60)
Glucose, Bld: 106 mg/dL — ABNORMAL HIGH (ref 65–99)
Potassium: 4 mmol/L (ref 3.5–5.1)
SODIUM: 140 mmol/L (ref 135–145)

## 2015-11-25 LAB — MAGNESIUM
MAGNESIUM: 2.6 mg/dL — AB (ref 1.7–2.4)
MAGNESIUM: 2.9 mg/dL — AB (ref 1.7–2.4)
Magnesium: 2.2 mg/dL (ref 1.7–2.4)

## 2015-11-25 LAB — POCT I-STAT, CHEM 8
BUN: 13 mg/dL (ref 6–20)
CHLORIDE: 101 mmol/L (ref 101–111)
CREATININE: 1.1 mg/dL (ref 0.61–1.24)
Calcium, Ion: 1.13 mmol/L — ABNORMAL LOW (ref 1.15–1.40)
GLUCOSE: 169 mg/dL — AB (ref 65–99)
HCT: 23 % — ABNORMAL LOW (ref 39.0–52.0)
Hemoglobin: 7.8 g/dL — ABNORMAL LOW (ref 13.0–17.0)
POTASSIUM: 3.9 mmol/L (ref 3.5–5.1)
Sodium: 137 mmol/L (ref 135–145)
TCO2: 24 mmol/L (ref 0–100)

## 2015-11-25 LAB — HEMOGLOBIN AND HEMATOCRIT, BLOOD
HCT: 29.5 % — ABNORMAL LOW (ref 39.0–52.0)
Hemoglobin: 10.4 g/dL — ABNORMAL LOW (ref 13.0–17.0)

## 2015-11-25 LAB — CREATININE, SERUM
Creatinine, Ser: 1.18 mg/dL (ref 0.61–1.24)
Creatinine, Ser: 1.17 mg/dL (ref 0.61–1.24)
GFR calc non Af Amer: >60 mL/min (ref >60)

## 2015-11-25 LAB — HEPARIN LEVEL (UNFRACTIONATED): HEPARIN UNFRACTIONATED: 0.29 [IU]/mL — AB (ref 0.30–0.70)

## 2015-11-25 MED ORDER — WARFARIN - PHYSICIAN DOSING INPATIENT
Freq: Every day | Status: DC
  Administered 2015-11-25 – 2015-11-29 (×3)

## 2015-11-25 MED ORDER — INSULIN ASPART 100 UNIT/ML ~~LOC~~ SOLN
0.0000 [IU] | SUBCUTANEOUS | Status: DC
  Administered 2015-11-25: 2 [IU] via SUBCUTANEOUS
  Administered 2015-11-25: 4 [IU] via SUBCUTANEOUS
  Administered 2015-11-26: 2 [IU] via SUBCUTANEOUS

## 2015-11-25 MED ORDER — INSULIN DETEMIR 100 UNIT/ML ~~LOC~~ SOLN
20.0000 [IU] | Freq: Once | SUBCUTANEOUS | Status: AC
  Administered 2015-11-25: 20 [IU] via SUBCUTANEOUS
  Filled 2015-11-25: qty 0.2

## 2015-11-25 MED ORDER — WARFARIN SODIUM 5 MG PO TABS
5.0000 mg | ORAL_TABLET | Freq: Every day | ORAL | Status: DC
  Administered 2015-11-25 – 2015-11-29 (×5): 5 mg via ORAL
  Filled 2015-11-25 (×5): qty 1

## 2015-11-25 MED ORDER — HEPARIN (PORCINE) IN NACL 100-0.45 UNIT/ML-% IJ SOLN
1700.0000 [IU]/h | INTRAMUSCULAR | Status: DC
  Administered 2015-11-25: 1450 [IU]/h via INTRAVENOUS
  Administered 2015-11-26: 1700 [IU]/h via INTRAVENOUS
  Filled 2015-11-25 (×2): qty 250

## 2015-11-25 MED ORDER — METOCLOPRAMIDE HCL 5 MG/ML IJ SOLN
10.0000 mg | Freq: Four times a day (QID) | INTRAMUSCULAR | Status: AC
  Administered 2015-11-25 – 2015-11-26 (×6): 10 mg via INTRAVENOUS
  Filled 2015-11-25 (×5): qty 2

## 2015-11-25 MED ORDER — ASPIRIN EC 81 MG PO TBEC
81.0000 mg | DELAYED_RELEASE_TABLET | Freq: Every day | ORAL | Status: DC
  Administered 2015-11-25 – 2015-12-02 (×8): 81 mg via ORAL
  Filled 2015-11-25 (×8): qty 1

## 2015-11-25 NOTE — Progress Notes (Signed)
ANTICOAGULATION CONSULT NOTE  Pharmacy Consult for heparin Indication: antiphospholipid antibody syndrome, DVT history  Heparin Dosing Weight: 85.6kg   Assessment: 55 yom with history of antiphospholipid antibody syndrome, DVT history. Previously on Xarelto before Oncology recommended warfarin/ASA [facility](AC has been on hold for procedure). Now s/p excision of AV mass, AVR, CABGx2 on 10/24. Pharmacy consulted to begin heparin at 1600 today without bolus. Warfarin started 10/25 per TCTS. INR 1.38, Hg low stable, TCP with clumping plt.  Goal of Therapy:  INR 2-3 Heparin level 0.3-0.7 units/ml Monitor platelets by anticoagulation protocol: Yes   Plan:  No bolus Start heparin at 1450 units/h at 1600 - d/c when INR therapeutic Warfarin 5mg  daily ordered per TCTS 6h heparin level Daily heparin level/INR/CBC Monitor for s/sx bleeding   Babs BertinHaley Prabhnoor Ellenberger, PharmD, BCPS Clinical Pharmacist Pager [phone] 11/25/2015 9:44 AM

## 2015-11-25 NOTE — Telephone Encounter (Signed)
Pt currently in the hospital 

## 2015-11-25 NOTE — Progress Notes (Signed)
Patient ID: Nolene EbbsWarren W Frisina, male   DOB: 04-29-1960, 55 y.o.   MRN: [id number] EVENING ROUNDS NOTE :     [address]       [name], [id number]             [phone]                 1 Day Post-Op Procedure(s) (LRB): EXCISION OF AORTIC VALVE MASS  (N/A) AORTIC VALVE REPLACEMENT (N/A) TRANSESOPHAGEAL ECHOCARDIOGRAM (TEE) (N/A) CORONARY ARTERY BYPASS GRAFTING (CABG)x 2 WITH ENDOSCOPIC HARVESTING OF RIGHT SAPHENOUS VEIN -SVG to LAD -SVG to OM1 (N/A)  Total Length of Stay:  LOS: 1 day  BP 95/66   Pulse 80   Temp 98.7 F (37.1 C) (Oral)   Resp (!) 25   Ht 5\' 8"  (1.727 m)   Wt 200 lb 6.4 oz (90.9 kg)   SpO2 98%   BMI 30.47 kg/m   .Intake/Output      10/25 0701 - 10/26 0700   P.O.    I.V. (mL/kg) 259.3 (2.9)   Blood    NG/GT    IV Piggyback 50   Total Intake(mL/kg) 309.3 (3.4)   Urine (mL/kg/hr) 2000 (1.6)   Emesis/NG output    Blood    Chest Tube 650 (0.5)   Total Output 2650   Net -2340.7         . sodium chloride    . heparin 1,450 Units/hr (11/25/15 1900)  . phenylephrine (NEO-SYNEPHRINE) Adult infusion 25 mcg/min (11/25/15 1900)     Lab Results  Component Value Date   WBC 9.8 11/25/2015   HGB 7.8 (L) 11/25/2015   HCT 23.0 (L) 11/25/2015   PLT 61 (L) 11/25/2015   GLUCOSE 169 (H) 11/25/2015   ALT 100 (H) 11/23/2015   AST 52 (H) 11/23/2015   NA 137 11/25/2015   K 3.9 11/25/2015   CL 101 11/25/2015   CREATININE 1.17 11/25/2015   BUN 13 11/25/2015   CO2 25 11/25/2015   INR 1.38 11/24/2015   HGBA1C 5.3 11/19/2015   Alert and neuro intact On heparin and neo  Delight OvensEdward B Toan Mort MD  Beeper [phone] Office [phone] 11/25/2015 9:03 PM

## 2015-11-25 NOTE — Telephone Encounter (Signed)
-----   Message from Levert FeinsteinJames M Granfortuna, MD sent at 11/23/2015  3:36 PM EDT ----- Call pt: CXR, U/A normal. Platelets better than his usual at 108,000. Lovenox level was good. Green light for surgery tomorrow.

## 2015-11-25 NOTE — Progress Notes (Signed)
ANTICOAGULATION CONSULT NOTE - Follow Up Consult  Pharmacy Consult for heparin Indication: h/o DVT and antiphospholipid antibody syndrome, now s/p AVR  Labs:  Recent Labs  11/23/15 1058 11/24/15 0627  11/24/15 1715  11/24/15 2330 11/24/15 2337 11/25/15 0435 11/25/15 1656 11/25/15 1658 11/25/15 2220  HGB 14.0  --   < > 10.4*  < > 8.8* 8.5* 8.6* 7.8*  --   --   HCT 39.6  --   < > 29.1*  < > 25.1* 25.0* 24.2* 23.0*  --   --   PLT 106*  --   < > 61*  --  58*  --  61*  --   --   --   APTT  --  75*  --  45*  --   --   --   --   --   --   --   LABPROT  --  13.4  --  17.0*  --   --   --   --   --   --   --   INR  --  1.02  --  1.38  --   --   --   --   --   --   --   HEPARINUNFRC  --   --   --   --   --   --   --   --   --   --  0.29*  HEPRLOWMOCWT 0.53  --   --   --   --   --   --   --   --   --   --   CREATININE 1.19  --   < >  --   --  1.18 1.10 1.20 1.10 1.17  --   < > = values in this interval not displayed.   Assessment: 55yo male slightly subtherapeutic on heparin with initial dosing post-op; no overt signs of bleeding per RN.  Goal of Therapy:  Heparin level 0.3-0.7 units/ml   Plan:  Will increase heparin gtt slightly to 1500 units/hr and check level with am labs.  Vernard GamblesVeronda Chenita Ruda, PharmD, BCPS  11/25/2015,11:29 PM

## 2015-11-25 NOTE — Anesthesia Postprocedure Evaluation (Signed)
Anesthesia Post Note  Patient: Brian Lloyd  Procedure(s) Performed: Procedure(s) (LRB): EXCISION OF AORTIC VALVE MASS  (N/A) AORTIC VALVE REPLACEMENT (N/A) TRANSESOPHAGEAL ECHOCARDIOGRAM (TEE) (N/A) CORONARY ARTERY BYPASS GRAFTING (CABG)x 2 WITH ENDOSCOPIC HARVESTING OF RIGHT SAPHENOUS VEIN -SVG to LAD -SVG to OM1 (N/A)  Patient location during evaluation: SICU Anesthesia Type: General Level of consciousness: sedated Pain management: pain level controlled Vital Signs Assessment: post-procedure vital signs reviewed and stable Respiratory status: patient remains intubated per anesthesia plan Cardiovascular status: stable Anesthetic complications: no    Last Vitals:  Vitals:   11/25/15 1000 11/25/15 1100  BP: 97/66 92/61  Pulse: 78 78  Resp: 18 20  Temp:      Last Pain:  Vitals:   11/25/15 0800  TempSrc:   PainSc: 6                  Leeona Mccardle,W. EDMOND

## 2015-11-25 NOTE — Progress Notes (Addendum)
[address]       [place] [address]             [phone]        CARDIOTHORACIC SURGERY PROGRESS NOTE   R1 Day Post-Op Procedure(s) (LRB): EXCISION OF AORTIC VALVE MASS  (N/A) AORTIC VALVE REPLACEMENT (N/A) TRANSESOPHAGEAL ECHOCARDIOGRAM (TEE) (N/A) CORONARY ARTERY BYPASS GRAFTING (CABG)x 2 WITH ENDOSCOPIC HARVESTING OF RIGHT SAPHENOUS VEIN -SVG to LAD -SVG to OM1 (N/A)  Subjective: Feels sore in chest, mild nausea.  No SOB.  Feels hot and cold.  Objective: Vital signs: BP Readings from Last 1 Encounters:  11/25/15 (!) 81/51   Pulse Readings from Last 1 Encounters:  11/25/15 81   Resp Readings from Last 1 Encounters:  11/25/15 20   Temp Readings from Last 1 Encounters:  11/25/15 99.9 F (37.7 C)    Hemodynamics: PAP: (20-48)/(10-36) 34/19 CO:  [4.4 L/min-6.5 L/min] 5.3 L/min CI:  [2 L/min/m2-2.9 L/min/m2] 2.4 L/min/m2  Physical Exam:  Rhythm:   sinus  Breath sounds: clear  Heart sounds:  RRR  Incisions:  Dressing dry, intact  Abdomen:  Soft, non-distended, non-tender  Extremities:  Warm, well-perfused  Chest tubes:  low volume thin serosanguinous output, no air leak    Intake/Output from previous day: 10/24 0701 - 10/25 0700 In: 10328.2 [P.O.:440; I.V.:7062.2; Blood:896; NG/GT:30; IV Piggyback:1900] Out: 6105 [Urine:4005; Emesis/NG output:50; Blood:1600; Chest Tube:450] Intake/Output this shift: No intake/output data recorded.  Lab Results:  CBC: Recent Labs  11/24/15 2330 11/24/15 2337 11/25/15 0435  WBC 9.3  --  9.8  HGB 8.8* 8.5* 8.6*  HCT 25.1* 25.0* 24.2*  PLT 58*  --  61*    BMET:  Recent Labs  11/23/15 1058  11/24/15 2337 11/25/15 0435  NA 137  < > 142 140  K 4.4  < > 3.9 4.0  CL 106  < > 105 110  CO2 24  --   --  25  GLUCOSE 101*  < > 126* 106*  BUN 19  < > 14 12  CREATININE 1.19  < > 1.10 1.20  CALCIUM 9.5  --   --  8.1*  < > = values in this interval not displayed.   PT/INR:   Recent Labs  11/24/15 1715  LABPROT 17.0*  INR 1.38    CBG (last 3)   Recent Labs  11/25/15 0437 11/25/15 0549 11/25/15 0644  GLUCAP 106* 110* 109*    ABG    Component Value Date/Time   PHART 7.365 11/24/2015 2213   PCO2ART 41.6 11/24/2015 2213   PO2ART 88.0 11/24/2015 2213   HCO3 23.7 11/24/2015 2213   TCO2 23 11/24/2015 2337   ACIDBASEDEF 1.0 11/24/2015 2213   O2SAT 96.0 11/24/2015 2213    CXR: PORTABLE CHEST 1 VIEW  COMPARISON:  Radiograph of November 24, 2015.  FINDINGS: Stable cardiomediastinal silhouette. Status post coronary artery bypass graft. Status post aortic valve replacement. Right internal jugular Swan-Ganz catheter is noted with tip directed into right pulmonary artery. Endotracheal and nasogastric tubes have been removed. Mediastinal drain is noted. Mild bibasilar subsegmental atelectasis is noted. No pneumothorax is noted. Bony thorax is unremarkable.  IMPRESSION: Status post aortic valve replacement. Endotracheal and nasogastric tubes have been removed. Stable position of right internal jugular Swan-Ganz catheter. Mild bibasilar subsegmental atelectasis.   Electronically Signed   By: Lupita Raider, M.D.   On: 11/25/2015 08:44   EKG: NSR w/out acute ischemic changes    Assessment/Plan: S/P Procedure(s) (  LRB): EXCISION OF AORTIC VALVE MASS  (N/A) AORTIC VALVE REPLACEMENT (N/A) TRANSESOPHAGEAL ECHOCARDIOGRAM (TEE) (N/A) CORONARY ARTERY BYPASS GRAFTING (CABG)x 2 WITH ENDOSCOPIC HARVESTING OF RIGHT SAPHENOUS VEIN -SVG to LAD -SVG to OM1 (N/A)  Doing well POD1 Maintaining NSR w/ stable hemodynamics on low dose milrinone, dopamine and Neo for BP support Breathing comfortably w/ O2 sats 96-100% on nasal cannula Expected post op acute blood loss anemia, Hgb 8.6 stable Expected post op volume excess, weight 10 lbs > baseline Libman-Sacks endocarditis Antiphospholipid antibody syndrome  Thrombocytopenia w/ clumping platelets    Mobilize  Wean  drips  Leave chest tubes in today  Start heparin drip this afternoon without bolus  Restart coumadin and low dose aspirin  Purcell Nailslarence H Owen, MD 11/25/2015 8:50 AM

## 2015-11-25 NOTE — Progress Notes (Signed)
Hematology: Patient well known to me from recent outpatient and inpatient evaluations. Newly diagnosed primary antiphospholipid antibody syndrome with initial venous thrombosis LLE. Subsequent arterial thrombosis with focal lacunar infarct in the left cerebellum while on therapeutic Xarelto. Found to have a large vegetation on his aortic valve. Recurrent bifrontal infarcts while on aspirin, Lovenox and starting Coumadin. No signs or symptoms of infection. Normal white count and differential. ESR 2 mm. 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 He underwent open heart surgery yesterday. Secondary to dense adhesion of the vegetation to the aortic valve, valve had to be sacrificed. Bio Prosthetic aortic valve placed. Bypass grafting 2 required to provide adequate coronary flow. Details discussed with Dr. Roxy Manns post op.  He is currently awake, alert, oriented. Pericardial friction rub. Wound dressings dry. No focal neurologic deficits.  Hemoglobin 8.6. He did not require any transfusion support during the surgical procedure. Platelet count 61,000.  Impression: #1. Primary antiphospholipid antibody syndrome #2. 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 Falsely low platelet count due to in vitro platelet clumping.  Recommendation: Danger of recurrent stroke now resolved with removal of the aortic valve vegetation. However, he remains at high risk for thrombotic events and indefinite anticoagulation is planned. When stable from cardiac surgery point of view, plan is to begin unfractionated heparin without a loading dose and warfarin also without any loading dose. He became therapeutic on warfarin after 4 doses at 5 mg and one dose at 7.5 mg as an outpatient. Please note, spurious thrombocytopenia and hypercoagulable state there are no contraindications to using heparin in this man in fact  anticoagulation is potentially life saving.

## 2015-11-25 NOTE — Discharge Instructions (Addendum)
Endoscopic Saphenous Vein Harvesting, Care After Refer to this sheet in the next few weeks. These instructions provide you with information on caring for yourself after your procedure. Your health care provider may also give you more specific instructions. Your treatment has been planned according to current medical practices, but problems sometimes occur. Call your health care provider if you have any problems or questions after your procedure. HOME CARE INSTRUCTIONS Medicine  Take whatever pain medicine your surgeon prescribes. Follow the directions carefully. Do not take over-the-counter pain medicine unless your surgeon says it is okay. Some pain medicine can cause bleeding problems for several weeks after surgery.  Follow your surgeon's instructions about driving. You will probably not be permitted to drive after heart surgery.  Take any medicines your surgeon prescribes. Any medicines you took before your heart surgery should be checked with your health care provider before you start taking them again. Wound care  If your surgeon has prescribed an elastic bandage or stocking, ask how long you should wear it.  Check the area around your surgical cuts (incisions) whenever your bandages (dressings) are changed. Look for any redness or swelling.  You will need to return to have the stitches (sutures) or staples taken out. Ask your surgeon when to do that.  Ask your surgeon when you can shower or bathe. Activity  Try to keep your legs raised when you are sitting.  Do any exercises your health care providers have given you. These may include deep breathing exercises, coughing, walking, or other exercises. SEEK MEDICAL CARE IF:  You have any questions about your medicines.  You have more leg pain, especially if your pain medicine stops working.  New or growing bruises develop on your leg.  Your leg swells, feels tight, or becomes red.  You have numbness in your leg. SEEK IMMEDIATE  MEDICAL CARE IF:  Your pain gets much worse.  Blood or fluid leaks from any of the incisions.  Your incisions become warm, swollen, or red.  You have chest pain.  You have trouble breathing.  You have a fever.  You have more pain near your leg incision. MAKE SURE YOU:  Understand these instructions.  Will watch your condition.  Will get help right away if you are not doing well or get worse.   This information is not intended to replace advice given to you by your health care provider. Make sure you discuss any questions you have with your health care provider.   Document Released: 09/29/2010 Document Revised: 02/07/2014 Document Reviewed: 09/29/2010 Elsevier Interactive Patient Education 2016 [employer] Coronary Artery Bypass Grafting, Care After These instructions give you information on caring for yourself after your procedure. Your doctor may also give you more specific instructions. Call your doctor if you have any problems or questions after your procedure.  HOME CARE  Only take medicine as told by your doctor. Take medicines exactly as told. Do not stop taking medicines or start any new medicines without talking to your doctor first.  Take your pulse as told by your doctor.  Do deep breathing as told by your doctor. Use your breathing device (incentive spirometer), if given, to practice deep breathing several times a day. Support your chest with a pillow or your arms when you take deep breaths or cough.  Keep the area clean, dry, and protected where the surgery cuts (incisions) were made. Remove bandages (dressings) only as told by your doctor. If strips were applied to surgical area, do not take  them off. They fall off on their own.  Check the surgery area daily for puffiness (swelling), redness, or leaking fluid.  If surgery cuts were made in your legs:  Avoid crossing your legs.  Avoid sitting for long periods of time. Change positions every 30  minutes.  Raise your legs when you are sitting. Place them on pillows.  Wear stockings that help keep blood clots from forming in your legs (compression stockings).  Only take sponge baths until your doctor says it is okay to take showers. Pat the surgery area dry. Do not rub the surgery area with a washcloth or towel. Do not bathe, swim, or use a hot tub until your doctor says it is okay.  Eat foods that are high in fiber. These include raw fruits and vegetables, whole grains, beans, and nuts. Choose lean meats. Avoid canned, processed, and fried foods.  Drink enough fluids to keep your pee (urine) clear or pale yellow.  Weigh yourself every day.  Rest and limit activity as told by your doctor. You may be told to:  Stop any activity if you have chest pain, shortness of breath, changes in heartbeat, or dizziness. Get help right away if this happens.  Move around often for short amounts of time or take short walks as told by your doctor. Gradually become more active. You may need help to strengthen your muscles and build endurance.  Avoid lifting, pushing, or pulling anything heavier than 10 pounds (4.5 kg) for at least 6 weeks after surgery.  Do not drive until your doctor says it is okay.  Ask your doctor when you can go back to work.  Ask your doctor when you can begin sexual activity again.  Follow up with your doctor as told. GET HELP IF:  You have puffiness, redness, more pain, or fluid draining from the incision site.  You have a fever.  You have puffiness in your ankles or legs.  You have pain in your legs.  You gain 2 or more pounds (0.9 kg) a day.  You feel sick to your stomach (nauseous) or throw up (vomit).  You have watery poop (diarrhea). GET HELP RIGHT AWAY IF:  You have chest pain that goes to your jaw or arms.  You have shortness of breath.  You have a fast or irregular heartbeat.  You notice a "clicking" in your breastbone when you move.  You  have numbness or weakness in your arms or legs.  You feel dizzy or light-headed. MAKE SURE YOU:  Understand these instructions.  Will watch your condition.  Will get help right away if you are not doing well or get worse.   This information is not intended to replace advice given to you by your health care provider. Make sure you discuss any questions you have with your health care provider.   Document Released: 01/22/2013 Document Reviewed: 01/22/2013 Elsevier Interactive Patient Education 2016 Elsevier Inc. Aortic Valve Replacement, Care After Refer to this sheet in the next few weeks. These instructions provide you with information on caring for yourself after your procedure. Your health care provider may also give you specific instructions. Your treatment has been planned according to current medical practices, but problems sometimes occur. Call your health care provider if you have any problems or questions after your procedure. HOME CARE INSTRUCTIONS   Take medicines only as directed by your health care provider.  If your health care provider has prescribed elastic stockings, wear them as directed.  Take frequent  naps or rest often throughout the day.  Avoid lifting over 10 lbs (4.5 kg) or pushing or pulling things with your arms for 6-8 weeks or as directed by your health care provider.  Avoid driving or airplane travel for 4-6 weeks after surgery or as directed by your health care provider. If you are riding in a car for an extended period, stop every 1-2 hours to stretch your legs. Keep a record of your medicines and medical history with you when traveling.  Do not drive or operate heavy machinery while taking pain medicine. (narcotics).  Do not cross your legs.  Do not use any tobacco products including cigarettes, chewing tobacco, or electronic cigarettes. If you need help quitting, ask your health care provider.  Do not take baths, swim, or use a hot tub until your  health care provider approves. Take showers once your health care provider approves. Pat incisions dry. Do not rub incisions with a washcloth or towel.  Avoid climbing stairs and using the handrail to pull yourself up for the first 2-3 weeks after surgery.  Return to work as directed by your health care provider.  Drink enough fluid to keep your urine clear or pale yellow.  Do not strain to have a bowel movement. Eat high-fiber foods if you become constipated. You may also take a medicine to help you have a bowel movement (laxative) as directed by your health care provider.  Resume sexual activity as directed by your health care provider. Men should not use medicines for erectile dysfunction until their doctor says it isokay.  If you had a certain type of heart condition in the past, you may need to take antibiotic medicine before having dental work or surgery. Let your dentist and health care providers know if you had one or more of the following:  Previous endocarditis.  An artificial (prosthetic) heart valve.  Congenital heart disease. SEEK MEDICAL CARE IF:  You develop a skin rash.   You experience sudden changes in your weight.  You have a fever. SEEK IMMEDIATE MEDICAL CARE IF:   You develop chest pain that is not coming from your incision.  You have drainage (pus), redness, swelling, or pain at your incision site.   You develop shortness of breath or have difficulty breathing.   You have increased bleeding from your incision site.   You develop light-headedness.  MAKE SURE YOU:   Understand these directions.  Will watch your condition.  Will get help right away if you are not doing well or get worse.   This information is not intended to replace advice given to you by your health care provider. Make sure you discuss any questions you have with your health care provider.   Document Released: 08/05/2004 Document Revised: 02/07/2014 Document Reviewed:  11/01/2011 Elsevier Interactive Patient Education 2016 [employer] Information on my medicine - Coumadin   (Warfarin)  This medication education was reviewed with me or my healthcare representative as part of my discharge preparation.  Why was Coumadin prescribed for you? Coumadin was prescribed for you because you have a blood clot or a medical condition that can cause an increased risk of forming blood clots. Blood clots can cause serious health problems by blocking the flow of blood to the heart, lung, or brain. Coumadin can prevent harmful blood clots from forming. As a reminder your indication for Coumadin is:   Deep Vein Thrombosis Treatment , antiphospholipid antibody sydrome  What test will check on my response to Coumadin?  While on Coumadin (warfarin) you will need to have an INR test regularly to ensure that your dose is keeping you in the desired range. The INR (international normalized ratio) number is calculated from the result of the laboratory test called prothrombin time (PT).  If an INR APPOINTMENT HAS NOT ALREADY BEEN MADE FOR YOU please schedule an appointment to have this lab work done by your health care provider within 7 days. Your INR goal is usually a number between:  2 to 3 or your provider may give you a more narrow range like 2-2.5.  Ask your health care provider during an office visit what your goal INR is.  What  do you need to  know  About  COUMADIN? Take Coumadin (warfarin) exactly as prescribed by your healthcare provider about the same time each day.  DO NOT stop taking without talking to the doctor who prescribed the medication.  Stopping without other blood clot prevention medication to take the place of Coumadin may increase your risk of developing a new clot or stroke.  Get refills before you run out.  What do you do if you miss a dose? If you miss a dose, take it as soon as you remember on the same day then continue your regularly scheduled regimen the  next day.  Do not take two doses of Coumadin at the same time.  Important Safety Information A possible side effect of Coumadin (Warfarin) is an increased risk of bleeding. You should call your healthcare provider right away if you experience any of the following: ? Bleeding from an injury or your nose that does not stop. ? Unusual colored urine (red or dark brown) or unusual colored stools (red or black). ? Unusual bruising for unknown reasons. ? A serious fall or if you hit your head (even if there is no bleeding).  Some foods or medicines interact with Coumadin (warfarin) and might alter your response to warfarin. To help avoid this: ? Eat a balanced diet, maintaining a consistent amount of Vitamin K. ? Notify your provider about major diet changes you plan to make. ? Avoid alcohol or limit your intake to 1 drink for women and 2 drinks for men per day. (1 drink is 5 oz. wine, 12 oz. beer, or 1.5 oz. liquor.)  Make sure that ANY health care provider who prescribes medication for you knows that you are taking Coumadin (warfarin).  Also make sure the healthcare provider who is monitoring your Coumadin knows when you have started a new medication including herbals and non-prescription products.  Coumadin (Warfarin)  Major Drug Interactions  Increased Warfarin Effect Decreased Warfarin Effect  Alcohol (large quantities) Antibiotics (esp. Septra/Bactrim, Flagyl, Cipro) Amiodarone (Cordarone) Aspirin (ASA) Cimetidine (Tagamet) Megestrol (Megace) NSAIDs (ibuprofen, naproxen, etc.) Piroxicam (Feldene) Propafenone (Rythmol SR) Propranolol (Inderal) Isoniazid (INH) Posaconazole (Noxafil) Barbiturates (Phenobarbital) Carbamazepine (Tegretol) Chlordiazepoxide (Librium) Cholestyramine (Questran) Griseofulvin Oral Contraceptives Rifampin Sucralfate (Carafate) Vitamin K   Coumadin (Warfarin) Major Herbal Interactions  Increased Warfarin Effect Decreased Warfarin Effect   Garlic Ginseng Ginkgo biloba Coenzyme Q10 Green tea St. Johns wort    Coumadin (Warfarin) FOOD Interactions  Eat a consistent number of servings per week of foods HIGH in Vitamin K (1 serving =  cup)  Collards (cooked, or boiled & drained) Kale (cooked, or boiled & drained) Mustard greens (cooked, or boiled & drained) Parsley *serving size only =  cup Spinach (cooked, or boiled & drained) Swiss chard (cooked, or boiled & drained) Turnip greens (cooked, or boiled &  drained)  Eat a consistent number of servings per week of foods MEDIUM-HIGH in Vitamin K (1 serving = 1 cup)  Asparagus (cooked, or boiled & drained) Broccoli (cooked, boiled & drained, or raw & chopped) Brussel sprouts (cooked, or boiled & drained) *serving size only =  cup Lettuce, raw (green leaf, endive, romaine) Spinach, raw Turnip greens, raw & chopped   These websites have more information on Coumadin (warfarin):  FailFactory.se; VeganReport.com.au;

## 2015-11-26 ENCOUNTER — Inpatient Hospital Stay (HOSPITAL_COMMUNITY): Payer: BLUE CROSS/BLUE SHIELD

## 2015-11-26 DIAGNOSIS — I6310 Cerebral infarction due to embolism of unspecified precerebral artery: Secondary | ICD-10-CM

## 2015-11-26 LAB — GLUCOSE, CAPILLARY
GLUCOSE-CAPILLARY: 116 mg/dL — AB (ref 65–99)
Glucose-Capillary: 127 mg/dL — ABNORMAL HIGH (ref 65–99)
Glucose-Capillary: 142 mg/dL — ABNORMAL HIGH (ref 65–99)
Glucose-Capillary: 128 mg/dL — ABNORMAL HIGH (ref 65–99)

## 2015-11-26 LAB — BASIC METABOLIC PANEL
Anion gap: 5 (ref 5–15)
BUN: 13 mg/dL (ref 6–20)
CHLORIDE: 104 mmol/L (ref 101–111)
CO2: 25 mmol/L (ref 22–32)
Calcium: 7.7 mg/dL — ABNORMAL LOW (ref 8.9–10.3)
Creatinine, Ser: 1.19 mg/dL (ref 0.61–1.24)
GFR calc non Af Amer: >60 mL/min (ref >60)
Glucose, Bld: 126 mg/dL — ABNORMAL HIGH (ref 65–99)
POTASSIUM: 3.9 mmol/L (ref 3.5–5.1)
SODIUM: 134 mmol/L — AB (ref 135–145)

## 2015-11-26 LAB — HEPARIN LEVEL (UNFRACTIONATED)
Heparin Unfractionated: 0.27 IU/mL — ABNORMAL LOW (ref 0.30–0.70)
Heparin Unfractionated: 0.36 IU/mL (ref 0.30–0.70)

## 2015-11-26 LAB — CBC
HCT: 24 % — ABNORMAL LOW (ref 39.0–52.0)
Hemoglobin: 8.2 g/dL — ABNORMAL LOW (ref 13.0–17.0)
MCH: 29.2 pg (ref 26.0–34.0)
MCHC: 34.2 g/dL (ref 30.0–36.0)
MCV: 85.4 fL (ref 78.0–100.0)
PLATELETS: 33 10*3/uL — AB (ref 150–400)
RBC: 2.81 MIL/uL — AB (ref 4.22–5.81)
RDW: 13.4 % (ref 11.5–15.5)
WBC: 9.4 10*3/uL (ref 4.0–10.5)

## 2015-11-26 LAB — SAVE SMEAR

## 2015-11-26 LAB — ACID FAST SMEAR (AFB, MYCOBACTERIA): Acid Fast Smear: NEGATIVE

## 2015-11-26 LAB — PROTIME-INR
INR: 1.22
PROTHROMBIN TIME: 15.5 s — AB (ref 11.4–15.2)

## 2015-11-26 MED ORDER — MOVING RIGHT ALONG BOOK
Freq: Once | Status: AC
  Administered 2015-11-26: 09:00:00
  Filled 2015-11-26: qty 1

## 2015-11-26 MED ORDER — MIDAZOLAM HCL 2 MG/2ML IJ SOLN
2.0000 mg | Freq: Once | INTRAMUSCULAR | Status: AC
Start: 2015-11-26 — End: 2015-11-26
  Administered 2015-11-26: 2 mg via INTRAVENOUS
  Filled 2015-11-26: qty 2

## 2015-11-26 MED ORDER — SODIUM CHLORIDE 0.9% FLUSH: 3.0000 mL | Freq: Two times a day (BID) | INTRAVENOUS | Status: DC

## 2015-11-26 MED ORDER — POTASSIUM CHLORIDE CRYS ER 20 MEQ PO TBCR
20.0000 meq | EXTENDED_RELEASE_TABLET | Freq: Two times a day (BID) | ORAL | Status: DC
  Administered 2015-11-26 (×2): 20 meq via ORAL
  Filled 2015-11-26 (×2): qty 1

## 2015-11-26 MED ORDER — FUROSEMIDE 40 MG PO TABS
40.0000 mg | ORAL_TABLET | Freq: Every day | ORAL | Status: AC
  Administered 2015-11-27 – 2015-11-29 (×3): 40 mg via ORAL
  Filled 2015-11-26 (×3): qty 1

## 2015-11-26 MED ORDER — SODIUM CHLORIDE 0.9 % IV SOLN: 250.0000 mL | INTRAVENOUS | Status: DC | PRN

## 2015-11-26 MED ORDER — FUROSEMIDE 10 MG/ML IJ SOLN
20.0000 mg | Freq: Two times a day (BID) | INTRAMUSCULAR | Status: AC
  Administered 2015-11-26 (×2): 20 mg via INTRAVENOUS
  Filled 2015-11-26 (×2): qty 2

## 2015-11-26 MED ORDER — SODIUM CHLORIDE 0.9% FLUSH: 3.0000 mL | INTRAVENOUS | Status: DC | PRN

## 2015-11-26 MED FILL — Heparin Sodium (Porcine) Inj 1000 Unit/ML: INTRAMUSCULAR | Qty: 30 | Status: AC

## 2015-11-26 MED FILL — Sodium Bicarbonate IV Soln 8.4%: INTRAVENOUS | Qty: 100 | Status: AC

## 2015-11-26 MED FILL — Sodium Chloride IV Soln 0.9%: INTRAVENOUS | Qty: 3000 | Status: AC

## 2015-11-26 MED FILL — Calcium Chloride Inj 10%: INTRAVENOUS | Qty: 10 | Status: AC

## 2015-11-26 MED FILL — Electrolyte-R (PH 7.4) Solution: INTRAVENOUS | Qty: 3000 | Status: AC

## 2015-11-26 MED FILL — Lidocaine HCl IV Inj 20 MG/ML: INTRAVENOUS | Qty: 5 | Status: AC

## 2015-11-26 MED FILL — Mannitol IV Soln 20%: INTRAVENOUS | Qty: 500 | Status: AC

## 2015-11-26 MED FILL — Heparin Sodium (Porcine) Inj 1000 Unit/ML: INTRAMUSCULAR | Qty: 20 | Status: AC

## 2015-11-26 NOTE — Progress Notes (Signed)
Hematology: Clinically stable. Rales left lung base. Friction rub resolved. Wound dressings dry PERRL, full EOMs, no focal neuro deficit Hb plateaued @ 8.2 Platelets recorded as 33,000 Imp: 1. 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 Thrombocytopenia - although element of platelet clumping - can't exclude some platelet consumption from surgery. Rec: No further dose increase in heparin. OK to continue coumadin. I will review peripheral blood film and advise

## 2015-11-26 NOTE — Progress Notes (Signed)
[facility] BRIEF SICU PROGRESS NOTE  2 Days Post-Op  S/P Procedure(s) (LRB): EXCISION OF AORTIC VALVE MASS  (N/A) AORTIC VALVE REPLACEMENT (N/A) TRANSESOPHAGEAL ECHOCARDIOGRAM (TEE) (N/A) CORONARY ARTERY BYPASS GRAFTING (CABG)x 2 WITH ENDOSCOPIC HARVESTING OF RIGHT SAPHENOUS VEIN -SVG to LAD -SVG to OM1 (N/A)   Stable day NSR w/ stable BP Breathing comfortably w/ O2 sats 100% on room air Chest tube output remains significant w/ thin serosanguinous drainage  Plan: Will hold heparin for now due to continued chest tube drainage.  Brian Lloyd Check, MD 11/26/2015 7:07 PM

## 2015-11-26 NOTE — Progress Notes (Signed)
Hematology Addendum: Smear reviewed. Average 5 platelets/high power field. Scattered, small, platelet clumps. Estimated platelet count 75,000. OK to continue heparin. Keep in low therapeutic range.

## 2015-11-26 NOTE — Progress Notes (Signed)
ANTICOAGULATION CONSULT NOTE - Follow Up Consult  Pharmacy Consult for heparin Indication: h/o DVT and antiphospholipid antibody syndrome, now s/p AVR  Labs:  Recent Labs  11/23/15 1058 11/24/15 0627  11/24/15 1715  11/24/15 2330  11/25/15 0435 11/25/15 1656 11/25/15 1658 11/25/15 2220 11/26/15 0330  HGB 14.0  --   < > 10.4*  < > 8.8*  < > 8.6* 7.8*  --   --  8.2*  HCT 39.6  --   < > 29.1*  < > 25.1*  < > 24.2* 23.0*  --   --  24.0*  PLT 106*  --   < > 61*  --  58*  --  61*  --   --   --  33*  APTT  --  75*  --  45*  --   --   --   --   --   --   --   --   LABPROT  --  13.4  --  17.0*  --   --   --   --   --   --   --  15.5*  INR  --  1.02  --  1.38  --   --   --   --   --   --   --  1.22  HEPARINUNFRC  --   --   --   --   --   --   --   --   --   --  0.29* 0.27*  HEPRLOWMOCWT 0.53  --   --   --   --   --   --   --   --   --   --   --   CREATININE 1.19  --   < >  --   --  1.18  < > 1.20 1.10 1.17  --  1.19  < > = values in this interval not displayed.   Assessment: 55yo male remains subtherapeutic on heparin with lower level despite increased rate.  Goal of Therapy:  Heparin level 0.3-0.7 units/ml   Plan:  Will increase heparin gtt by 2 units/kg/hr to 1700 units/hr and check level in 6hr.  Vernard GamblesVeronda Mike Berntsen, PharmD, BCPS  11/26/2015,4:55 AM

## 2015-11-26 NOTE — Progress Notes (Signed)
ANTICOAGULATION CONSULT NOTE - Follow Up Consult  Pharmacy Consult for Heparin Indication: antiphospholipid antibody syndrome, hx DVT, CVA  Allergies  Allergen Reactions  . Naproxen Anaphylaxis    Patient Measurements: Height: 5\' 8"  (172.7 cm) Weight: 200 lb 6.4 oz (90.9 kg) IBW/kg (Calculated) : 68.4 Heparin Dosing Weight: 85.6 kg  Vital Signs: Temp: 98.6 F (37 C) (10/26 1230) Temp Source: Oral (10/26 1230) BP: 95/70 (10/26 1400) Pulse Rate: 89 (10/26 1400)  Labs:  Recent Labs  11/24/15 0627  11/24/15 1715  11/24/15 2330  11/25/15 0435 11/25/15 1656 11/25/15 1658 11/25/15 2220 11/26/15 0330 11/26/15 1100  HGB  --   < > 10.4*  < > 8.8*  < > 8.6* 7.8*  --   --  8.2*  --   HCT  --   < > 29.1*  < > 25.1*  < > 24.2* 23.0*  --   --  24.0*  --   PLT  --   < > 61*  --  58*  --  61*  --   --   --  33*  --   APTT 75*  --  45*  --   --   --   --   --   --   --   --   --   LABPROT 13.4  --  17.0*  --   --   --   --   --   --   --  15.5*  --   INR 1.02  --  1.38  --   --   --   --   --   --   --  1.22  --   HEPARINUNFRC  --   --   --   --   --   --   --   --   --  0.29* 0.27* 0.36  CREATININE  --   < >  --   --  1.18  < > 1.20 1.10 1.17  --  1.19  --   < > = values in this interval not displayed.  Estimated Creatinine Clearance: 75 mL/min (by C-G formula based on SCr of 1.19 mg/dL).  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 Heme/Oncology changed to  Enoxaparin/warfarin/ASA [facility](AC had been on hold for procedure). Now s/p excision of AV mass, AVR, CABG x2 on 11/24/15.    Heparin drip begun on 10/25.  First 2 levels were just below low therapeutic target range and drip rate adjusted x 2.  Heparin level is now low therapeutic (0.36) on 1700 units/hr. Hx thrombocytopenia. Platelet count in 50s-60s pre-op, down to 33 today with clumping, though estimated ~75K per Dr.  Patsy LagerGranfortuna's review of smear.  No bleeding noted.   Coumadin 5 mg daily begun 10/25, INR 1.22.  Goal of Therapy:  Heparin level 0.3-0.5 units/ml, lower end of therapeutic range Monitor platelets by anticoagulation protocol: Yes   Plan:   Continue heparin drip at 1700 units/hr.  Coumadin 5 mg daily per Dr. Cornelius Moraswen.  Daily heparin level, PT/INR and CBC.  Monitor for any bleeding.  Dennie FettersEgan, Byron Peacock Donovan, RPh Pager: [phone] 11/26/2015,2:05 PM

## 2015-11-26 NOTE — Progress Notes (Signed)
[address]       [place] [address]             [phone]        CARDIOTHORACIC SURGERY PROGRESS NOTE   R2 Days Post-Op Procedure(s) (LRB): EXCISION OF AORTIC VALVE MASS  (N/A) AORTIC VALVE REPLACEMENT (N/A) TRANSESOPHAGEAL ECHOCARDIOGRAM (TEE) (N/A) CORONARY ARTERY BYPASS GRAFTING (CABG)x 2 WITH ENDOSCOPIC HARVESTING OF RIGHT SAPHENOUS VEIN -SVG to LAD -SVG to OM1 (N/A)  Subjective: Feels better.  Still sore but improved  Objective: Vital signs: BP Readings from Last 1 Encounters:  11/26/15 92/66   Pulse Readings from Last 1 Encounters:  11/26/15 81   Resp Readings from Last 1 Encounters:  11/26/15 (!) 22   Temp Readings from Last 1 Encounters:  11/26/15 99.1 F (37.3 C) (Oral)    Hemodynamics:    Physical Exam:  Rhythm:   sinus  Breath sounds: clear  Heart sounds:  RRR  Incisions:  Dressing dry, intact  Abdomen:  Soft, non-distended, non-tender  Extremities:  Warm, well-perfused  Chest tubes:  decreased but significant volume thin serosanguinous output, no air leak     Intake/Output from previous day: 10/25 0701 - 10/26 0700 In: 786.9 [I.V.:686.9; IV Piggyback:100] Out: 3480 [Urine:2600; Chest Tube:880] Intake/Output this shift: No intake/output data recorded.  Lab Results:  CBC: Recent Labs  11/25/15 0435 11/25/15 1656 11/26/15 0330  WBC 9.8  --  9.4  HGB 8.6* 7.8* 8.2*  HCT 24.2* 23.0* 24.0*  PLT 61*  --  33*    BMET:  Recent Labs  11/25/15 0435 11/25/15 1656 11/25/15 1658 11/26/15 0330  NA 140 137  --  134*  K 4.0 3.9  --  3.9  CL 110 101  --  104  CO2 25  --   --  25  GLUCOSE 106* 169*  --  126*  BUN 12 13  --  13  CREATININE 1.20 1.10 1.17 1.19  CALCIUM 8.1*  --   --  7.7*     PT/INR:   Recent Labs  11/26/15 0330  LABPROT 15.5*  INR 1.22    CBG (last 3)   Recent Labs  11/25/15 2345 11/26/15 0402 11/26/15 0749  GLUCAP 86 116* 127*    ABG    Component Value Date/Time   PHART  7.365 11/24/2015 2213   PCO2ART 41.6 11/24/2015 2213   PO2ART 88.0 11/24/2015 2213   HCO3 23.7 11/24/2015 2213   TCO2 24 11/25/2015 1656   ACIDBASEDEF 1.0 11/24/2015 2213   O2SAT 96.0 11/24/2015 2213    CXR: PORTABLE CHEST 1 VIEW  COMPARISON:  One day prior  FINDINGS: Removal Swan-Ganz catheter with right internal jugular lines remaining in place. Prior median sternotomy. Mediastinal drain remains in place. Cardiomegaly accentuated by AP portable technique. No pleural effusion or pneumothorax. Low lung volumes with resultant pulmonary interstitial prominence. Similar left greater than right subsegmental atelectasis at the bases.  IMPRESSION: Cardiomegaly and low lung volumes, without acute disease.   Electronically Signed   By: Jeronimo Greaves M.D.   On: 11/26/2015 07:43  Assessment/Plan: S/P Procedure(s) (LRB): EXCISION OF AORTIC VALVE MASS  (N/A) AORTIC VALVE REPLACEMENT (N/A) TRANSESOPHAGEAL ECHOCARDIOGRAM (TEE) (N/A) CORONARY ARTERY BYPASS GRAFTING (CABG)x 2 WITH ENDOSCOPIC HARVESTING OF RIGHT SAPHENOUS VEIN -SVG to LAD -SVG to OM1 (N/A)  Doing well POD2 Maintaining NSR w/ stable BP off drips Breathing comfortably w/ O2 sats 98% on 2 L/min Expected post op acute blood loss anemia, Hgb 8.2 today  Expected post op volume excess, needs diuresis Expected post op atelectasis, mild Libman-Sacks endocarditis Antiphospholipid antibody syndrome  Thrombocytopenia w/ clumping platelets - reported platelet count down 33k on IV heparin   Mobilize  Diuresis  D/C large chest tube but leave soft mediastinal drain in place until output resolves  Recheck platelets per Dr Cyndie ChimeGranfortuna and continue heparin for now  Continue coumadin  Transfer step down   Purcell Nailslarence H Owen, MD 11/26/2015 8:10 AM

## 2015-11-27 ENCOUNTER — Telehealth: Payer: Self-pay | Admitting: Physician Assistant

## 2015-11-27 DIAGNOSIS — Z955 Presence of coronary angioplasty implant and graft: Secondary | ICD-10-CM

## 2015-11-27 LAB — BASIC METABOLIC PANEL
Anion gap: 4 — ABNORMAL LOW (ref 5–15)
BUN: 19 mg/dL (ref 6–20)
CALCIUM: 7.8 mg/dL — AB (ref 8.9–10.3)
CO2: 26 mmol/L (ref 22–32)
CREATININE: 1.32 mg/dL — AB (ref 0.61–1.24)
Chloride: 104 mmol/L (ref 101–111)
GFR calc non Af Amer: 58 mL/min — ABNORMAL LOW (ref >60)
GLUCOSE: 145 mg/dL — AB (ref 65–99)
Potassium: 4.3 mmol/L (ref 3.5–5.1)
Sodium: 134 mmol/L — ABNORMAL LOW (ref 135–145)

## 2015-11-27 LAB — PROTIME-INR
INR: 1.23
PROTHROMBIN TIME: 15.6 s — AB (ref 11.4–15.2)

## 2015-11-27 LAB — HEPARIN LEVEL (UNFRACTIONATED): Heparin Unfractionated: 0.27 IU/mL — ABNORMAL LOW (ref 0.30–0.70)

## 2015-11-27 LAB — CBC
HEMATOCRIT: 23.4 % — AB (ref 39.0–52.0)
Hemoglobin: 8 g/dL — ABNORMAL LOW (ref 13.0–17.0)
MCH: 29.6 pg (ref 26.0–34.0)
MCHC: 34.2 g/dL (ref 30.0–36.0)
MCV: 86.7 fL (ref 78.0–100.0)
PLATELETS: 33 10*3/uL — AB (ref 150–400)
RBC: 2.7 MIL/uL — ABNORMAL LOW (ref 4.22–5.81)
RDW: 13.4 % (ref 11.5–15.5)
WBC: 7 10*3/uL (ref 4.0–10.5)

## 2015-11-27 MED ORDER — HEPARIN (PORCINE) IN NACL 100-0.45 UNIT/ML-% IJ SOLN
1800.0000 [IU]/h | INTRAMUSCULAR | Status: DC
  Administered 2015-11-27 – 2015-12-01 (×6): 1800 [IU]/h via INTRAVENOUS
  Filled 2015-11-27 (×8): qty 250

## 2015-11-27 MED ORDER — POLYSACCHARIDE IRON COMPLEX 150 MG PO CAPS
150.0000 mg | ORAL_CAPSULE | Freq: Every day | ORAL | Status: DC
  Administered 2015-11-28 – 2015-12-02 (×5): 150 mg via ORAL
  Filled 2015-11-27 (×5): qty 1

## 2015-11-27 MED ORDER — HEPARIN (PORCINE) IN NACL 100-0.45 UNIT/ML-% IJ SOLN
1700.0000 [IU]/h | INTRAMUSCULAR | Status: DC
  Administered 2015-11-27: 1700 [IU]/h via INTRAVENOUS
  Filled 2015-11-27: qty 250

## 2015-11-27 MED ORDER — POTASSIUM CHLORIDE CRYS ER 20 MEQ PO TBCR
20.0000 meq | EXTENDED_RELEASE_TABLET | Freq: Every day | ORAL | Status: AC
  Administered 2015-11-27 – 2015-11-28 (×2): 20 meq via ORAL
  Filled 2015-11-27 (×2): qty 1

## 2015-11-27 MED ORDER — FA-PYRIDOXINE-CYANOCOBALAMIN 2.5-25-2 MG PO TABS
1.0000 | ORAL_TABLET | Freq: Every day | ORAL | Status: DC
  Administered 2015-11-27 – 2015-12-02 (×6): 1 via ORAL
  Filled 2015-11-27 (×6): qty 1

## 2015-11-27 MED FILL — Dexmedetomidine HCl in NaCl 0.9% IV Soln 400 MCG/100ML: INTRAVENOUS | Qty: 100 | Status: AC

## 2015-11-27 NOTE — Progress Notes (Signed)
ANTICOAGULATION CONSULT NOTE  Pharmacy Consult for heparin Indication: antiphospholipid antibody syndrome, DVT history  Heparin Dosing Weight: 85.6kg   Assessment: 55 yom with history of antiphospholipid antibody syndrome, DVT history. Previously on Xarelto before Oncology recommended warfarin/ASA [facility](AC on hold for procedure). Now s/p excision of AV mass, AVR, CABGx2 on 10/24. Heparin held 10/26 due to continued CT drainage. Pharmacy consulted to resume without bolus on 10/27. Warfarin started 10/25 per TCTS. INR 1.23 after 2 doses. Hg low stable, TCP with clumping plt - [place] ok with continuing anticoagulation.  Goal of Therapy:  INR 2-3 Heparin level 0.3-0.7 units/ml Monitor platelets by anticoagulation protocol: Yes   Plan:  No bolus; resume heparin at previous rate 1700 units/h per TCTS - d/c when INR therapeutic Warfarin 5mg  daily ordered per TCTS 6h heparin level Daily heparin level/INR/CBC Monitor for s/sx bleeding   Babs BertinHaley Taima Rada, PharmD, BCPS Clinical Pharmacist Pager [phone] 11/27/2015 9:45 AM

## 2015-11-27 NOTE — Progress Notes (Signed)
      [address]       [name] [id number]             [phone]        CARDIOTHORACIC SURGERY PROGRESS NOTE   R3 Days Post-Op Procedure(s) (LRB): EXCISION OF AORTIC VALVE MASS  (N/A) AORTIC VALVE REPLACEMENT (N/A) TRANSESOPHAGEAL ECHOCARDIOGRAM (TEE) (N/A) CORONARY ARTERY BYPASS GRAFTING (CABG)x 2 WITH ENDOSCOPIC HARVESTING OF RIGHT SAPHENOUS VEIN -SVG to LAD -SVG to OM1 (N/A)  Subjective: Feeling better every day.  No complaints  Objective: Vital signs: BP Readings from Last 1 Encounters:  11/27/15 99/65   Pulse Readings from Last 1 Encounters:  11/27/15 83   Resp Readings from Last 1 Encounters:  11/27/15 (!) 25   Temp Readings from Last 1 Encounters:  11/27/15 98.4 F (36.9 C)    Hemodynamics:    Physical Exam:  Rhythm:   sinus  Breath sounds: clear  Heart sounds:  RRR  Incisions:  Clean and dry  Abdomen:  Soft, non-distended, non-tender  Extremities:  Warm, well-perfused  Chest tubes:  Decreased but significant volume thin serosanguinous output - looks less bloody today, no air leak    Intake/Output from previous day: 10/26 0701 - 10/27 0700 In: 1159.8 [P.O.:960; I.V.:199.8] Out: 1720 [Urine:1270; Chest Tube:450] Intake/Output this shift: No intake/output data recorded.  Lab Results:  CBC: Recent Labs  11/26/15 0330 11/27/15 0237  WBC 9.4 7.0  HGB 8.2* 8.0*  HCT 24.0* 23.4*  PLT 33* 33*    BMET:  Recent Labs  11/26/15 0330 11/27/15 0237  NA 134* 134*  K 3.9 4.3  CL 104 104  CO2 25 26  GLUCOSE 126* 145*  BUN 13 19  CREATININE 1.19 1.32*  CALCIUM 7.7* 7.8*     PT/INR:   Recent Labs  11/27/15 0237  LABPROT 15.6*  INR 1.23    CBG (last 3)   Recent Labs  11/26/15 0749 11/26/15 1227 11/26/15 1622  GLUCAP 127* 142* 128*    ABG    Component Value Date/Time   PHART 7.365 11/24/2015 2213   PCO2ART 41.6 11/24/2015 2213   PO2ART 88.0 11/24/2015 2213   HCO3 23.7 11/24/2015 2213   TCO2 24 11/25/2015  1656   ACIDBASEDEF 1.0 11/24/2015 2213   O2SAT 96.0 11/24/2015 2213    CXR: n/a  Assessment/Plan: S/P Procedure(s) (LRB): EXCISION OF AORTIC VALVE MASS  (N/A) AORTIC VALVE REPLACEMENT (N/A) TRANSESOPHAGEAL ECHOCARDIOGRAM (TEE) (N/A) CORONARY ARTERY BYPASS GRAFTING (CABG)x 2 WITH ENDOSCOPIC HARVESTING OF RIGHT SAPHENOUS VEIN -SVG to LAD -SVG to OM1 (N/A)  Doing well POD3 Maintaining NSR w/ stable BP Breathing comfortably w/ O2 sats 98% on room air Expected post op acute blood loss anemia, Hgb 8.0 today Expected post op volume excess, still needs diuresis Acute kidney injury - presumably secondary to prerenal azotemia and/or ATN from surgery, creatinine up slightly 1.4 Expected post op atelectasis, mild Libman-Sacks endocarditis Antiphospholipid antibody syndrome  Thrombocytopenia w/ clumping platelets - reported platelet count down 33k  Chest tubes still draining thin serosanguinous fluid but output less bloody this morning w/ heparin on hold   Leave soft mediastinal drain in place until output resolves  Restart heparin w/out bolus and watch chest tubes and Hgb  Recheck platelets per Dr Cyndie ChimeGranfortuna  Continue coumadin  Watch renal function  Still waiting for bed to transfer step down   Purcell Nailslarence H Owen, MD 11/27/2015 9:04 AM

## 2015-11-27 NOTE — Significant Event (Signed)
Pacing wires removed at 0900-patient tolerated procedure. No tissues or blood noted on end tips of EPWs. Sites covered with dry gauze dressing, along with new dsg over existing chest tube. Not bleeding at site. VS stable. Patient is bed rest at this time.

## 2015-11-27 NOTE — Progress Notes (Signed)
Hematology: Clinically stable. Ambulating in room. Wound dressings dry. No focal neuro deficits. Calves non tender, no swelling. Heparin on temporary hold due to increased chest tube drainage on 10/26 which is now subsiding. Day 3 coumadin at 5 mg daily Hb stable at 8. Platelets machine count , which underestimates real value, no change from 10/26 @ 33,000. Impression: 1. Stable PO d 3 AVR/CABG to remove aortic valve vegetation 2. Primary antiphospholipid antibody syndrome 3. Hypercoaguable state due to 2. 4. 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 5. Merlene MorseLibman Sachs endocarditis due to 2. Rec: Continue current plan: per discussion w Dr Cornelius Moraswen, will resume heparin today; continue coumadin & ASA. He will need lifelong anticoagulation.

## 2015-11-27 NOTE — Telephone Encounter (Signed)
New message       Calling to let us know pt no showed 11-11-15 referral from Ronie Spiesayna Dunn, PA to [facility].  Do you still want pt to be seen?

## 2015-11-27 NOTE — Telephone Encounter (Signed)
Called Nicki back at [facility] re: pt missing appt.  Pt is being followed by Dr. Cyndie ChimeGranfortuna, per chart.

## 2015-11-27 NOTE — Progress Notes (Signed)
ANTICOAGULATION CONSULT NOTE - FOLLOW UP    HL = 0.27 (goal 0.3 - 0.5 units/mL) Heparin dosing weight = 86 kg   Assessment: 55 YOM with history of DVT and APS to continue on IV heparin.  Heparin level is slightly sub-therapeutic.  He is s/p excision of AV mass, AVR, and CABG x2 on 11/24/15 and MD is aware of thrombocytopenia.  No bleeding per RN.   Plan: - Increase heparin gtt slightly to 1800 units/hr - F/U AM labs - Monitor closely for bleeding   Mimie Goering D. Laney Potashang, PharmD, BCPS 11/27/2015, 5:22 PM

## 2015-11-27 NOTE — Discharge Summary (Signed)
Physician Discharge Summary  Patient ID: MANLEY FASON MRN: [id number] DOB/AGE: [birth year] 57 y.o.  Admit date: 11/24/2015 Discharge date: 12/02/2015  Admission Diagnoses:Aortic valve mass  Discharge Diagnoses:  Principal Problem:   S/P aortic valve replacement with bioprosthetic valve Active Problems:   Aortic valve mass   History of CVA (cerebrovascular accident) without residual deficits   Thrombocytopenia (HCC)   Antiphospholipid antibody syndrome (HCC)   Aortic valve vegetation   Libman-Sacks endocarditis [facility])  Patient Active Problem List   Diagnosis Date Noted  . S/P aortic valve replacement with bioprosthetic valve 11/24/2015  . Diplopia   . Embolic stroke (HCC) 11/19/2015  . Acute cerebral infarction (HCC) 11/18/2015  . 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 Antiphospholipid antibody syndrome (HCC) 11/06/2015  . Aortic valve vegetation 11/06/2015  . Thrombocytopenia (HCC)   . 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 History of CVA (cerebrovascular accident) without residual deficits 10/19/2015  . Cerebellar infarct (HCC) 10/06/2015  . DVT, lower extremity, distal, chronic (HCC) 08/01/2015    HPI: at time of consultation  Patient is a 55 year old male with unremarkable past medical history who has been referred for surgical consultation because of arecently small embolic stroke and was found to have a mass adherent to his aortic valve on transesophageal echocardiogram. The patient states that he was in his usual state of health until June of this year when he injured his left calf muscle. He was pushing a 250 pound generator up a hill and felt a sudden strain behind his left knee. He developed swelling and pain that persisted for more than 10 days. He was evaluated by Dr. Margaretha Sheffield in the sports medicine clinic and diagnosed with muscular strain and possible tear of the plantaris. Swelling persisted and the patient underwent a lower extremity duplex scan that was positive  for DVT. He was anticoagulated using Xarelto beginning on 08/10/2015. Approximately 2 weeks later the patient experienced an episode of severe dizziness and double vision which lasted approximately 2 minutes and resolved. He has had several brief recurrent episodes of double vision and mild dizziness over the last 2 months, but all episodes have been shorter duration and less in severity. He reported these symptoms to his primary care physician and ultimately underwent MRI of the brain on 10/06/2015 which revealed a small acute or subacute lacunar infarct in the left cerebellum with no associated mass effect or hemorrhage. A carotid duplex scan was performed andnotable for the absence of significant extracranial carotid artery disease and antegrade blood flow in the vertebral arteries. A transthoracic echocardiogram was performed demonstrating a "medium-sized, 1.4 cm" mass on the aortic valve felt suspicious for possible vegetation. There was no aortic insufficiency. There was normal left ventricular size and systolic function. There was no evidence of patent foramen ovale and no other abnormalities were noted.The patient was referred for cardiology consultation and evaluated by Dr. Herbie Baltimore. 2 sets of blood cultures were obtained and remained no growth. The patient underwent transesophageal echocardiogram on 10/22/2015. This revealed a mobile mass along the ventricular surface of aortic valve. 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 The aortic valve was tricuspid and there was trivial aortic insufficiency. No other significant abnormalities were noted. The patient underwent diagnostic cardiac catheterization on 10/29/2015. This was notable for the absence of significant coronary artery disease. Prior to catheterization routine blood work was abnormal with severe clumping of platelets. Complete blood count was repeated a total of 3 additional times, and  platelet count was  estimated 65,000and 66,000 on the 2 samples where platelet count was estimated. The patient was referred for surgical consultation. He has not been seen by a neurologist  The patient is married and lives with his wife in [place]. He has been retired for many years, previously working as an Acupuncturist. More recently he has been spending much of this time restoring automobiles. The patient states that up until this past June he remained entirely active physically. He exercises on a regular basis and enjoys walking regularly. He reports no physical limitations prior to this summer. He continues to experience occasional very brief episodes of double vision dating back to the more significant episode of double vision and dizziness which began in July. He states that symptoms are positional and he can oftentimes stop the double vision simply by changing the position of his head. He denies any fevers, chills, or night sweats. He reports normal appetite. He has a long history of arthritis and arthralgias. He states that despite the fact that he remains anticoagulated using Xarelto his blood clots very quickly if he scratches himself or cuts himself. His injury to the left calf has healed and his walking is now essentially back to normal.  Patient returns to the office today for follow-up of the small somewhat mobile-appearing mass noted on the ventricular surface of the aortic valve on previous transthoracic and transesophageal echocardiograms. He was originally seen in consultation on 11/03/2015. Since then he was seen in consultation by Dr. Cyndie Chime who has found that the patient has anti-phospholipid antibody syndrome documented by the presence of significant elevation of IgG anticardiolipin and beta-2-GP-1 antibodies. He has recommended that the patient be transitioned to warfarin and aspirin for long-term anticoagulation. The patient has also undergone cardiac gated CT  angiogram of the heart to further evaluate the anatomical characteristics associated with this mass, and the patient returns to our office today for follow-up. He has not yet started taking warfarin and currently remains anticoagulated using Xarelto. He reports no new problems or complaints since his last office visit.  Patient is well known to me from recent outpatient consultation. He was last seen in our office on 11/11/2015 at which time we made tentative plans for surgery. 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  Given recurrent embolic events it makes sense to proceed sooner rather than later.    Discharged Condition: good  Hospital Course: The patient was admitted electively and taken to the operating room at which time he underwent the below described surgical procedure. He tolerated it well and was taken to the surgical intensive care unit in stable condition.  Postoperative hospital course:  Overall the patient has progressed nicely. He initially did require some inotropic support but this was weaned without significant difficulty over time. He was extubated from the ventilator using standard protocols without difficulty. He does have an expected acute blood loss anemia. His H and H went down to 7.3 and 21.8. He was given a unit of PRBC. Platelets remained in the 20K range.  He also was seen postoperatively by hematology who had seen him as a preoperative consultation due to his antiphospholipid antibody syndrome. He is being anticoagulated during the postoperative period initially with heparin with transition to Coumadin and low-dose aspirin. He is on 7.5 mg of Coumadin daily. His last INR is 1.36. Per Dr. Cyndie Chime, stop Heparin drip and start Lovenox 1 mg/kg SQ bid along with current dose of Coumadin.  Patient needs to call for an appointment to have a CBC, PT and INR checked on Friday 12/04/2015 at Dr.  Patsy Lager office.  He did have a somewhat prolonged amount of drainage from his chest tubes. Mediastinal tube remained for several days and then was removed after output decreased on 11/29/2015. Follow up chest x ray was stable (no pneumothorax, left base atelectasis, small pleural effusions) . He has some postoperative volume excess and has responded to diuretics. He does have some acute renal insufficiency and values are being monitored closely. His last creatinine was stable at 1.32. He is tolerating gradually increasing activities using standard protocols. Per Dr. Cyndie Chime, ok to discharge today.  Pathology did reveal the following:  1. Heart valve leaflets, Aortic - CALCIFICATION AND FIBROSIS. 2. Heart valve leaflets, aortic mass and vegetation - FIBROSIS WITH GRANULATION TISSUE AND ABUNDANT FIBRIN. Jimmy Picket MD Pathologist, Electronic Signature  Consults: hematology/oncology  Significant Diagnostic Studies: routine post-op labs and serial CXR's EXAM: CHEST  2 VIEW  COMPARISON:  11/29/2015 and 10/26/2015 chest x-ray.  FINDINGS: Post valve replacement day CABG.  Heart size top-normal.  Pulmonary vascular prominence most notable centrally.  Small pleural effusions.  Left base subsegmental atelectasis.  No pneumothorax detected.  IMPRESSION: Post valve replacement and CABG.  Pulmonary vascular prominence most notable centrally with small pleural effusions.  Left base subsegmental atelectasis.  No pneumothorax detected.   Electronically Signed   By: Lacy Duverney M.D.   On: 11/30/2015 07:20  Treatments: surgery:   CARDIOTHORACIC SURGERY OPERATIVE NOTE  Date of Procedure:                11/24/2015  Preoperative Diagnosis:        Aortic Valve Mass  Recurrent Embolic Stroke  Postoperative Diagnosis:    same   Procedure:        Excision of Aortic Valve Mass   Aortic Valve Replacement             Edwards Magna Ease Pericardial  Tissue Valve (size 25 mm, model # 3300TFX, serial # [id number])   Coronary Artery Bypass Grafting x 2              Saphenous Vein Graft to Distal Left Anterior Descending Coronary Artery             Saphenous Vein Graft to Obtuse Marginal Branch of Left Circumflex Coronary Artery             Endoscopic Vein Harvest from Right Thigh                Surgeon:        Salvatore Decent. 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 rate 18, height 5\' 8"  (1.727  m), weight 193 lb 9.6 oz (87.8 kg), SpO2 98 %.  Cardiovascular: RRR, no murmur Pulmonary: Slightly diminished at bases Abdomen: Soft, non tender, bowel sounds present. Extremities: Mild bilateral lower extremity edema. Wounds: Clean and dry.  No erythema or signs of infection.  Disposition: 01-Home or Self Care     Medication List    STOP taking these medications   HYDROcodone-acetaminophen 5-325 MG tablet Commonly known as:  NORCO/VICODIN   multivitamin tablet     TAKE these medications   acetaminophen 500 MG tablet Commonly known as:  TYLENOL Take 1,000 mg by mouth every 6 (six)  hours as needed for mild pain or headache.   aspirin EC 81 MG tablet Take 1 tablet (81 mg total) by mouth daily. Patient states he has taken aspirin in the past without getting a reaction   enoxaparin 150 MG/ML injection Commonly known as:  LOVENOX Inject 0.59 mLs (90 mg total) into the skin 2 (two) times daily. What changed:  medication strength  when to take this   folic acid-pyridoxine-cyancobalamin 2.5-25-2 MG Tabs tablet Commonly known as:  FOLTX Take 1 tablet by mouth daily. For one month then stop.   iron polysaccharides 150 MG capsule Commonly known as:  NIFEREX Take 1 capsule (150 mg total) by mouth daily. For one month then stop.   metoprolol tartrate 25 MG tablet Commonly known as:  LOPRESSOR Take 0.5 tablets (12.5 mg total) by mouth 2 (two) times daily.   oxyCODONE 5 MG immediate release tablet Commonly known as:  Oxy IR/ROXICODONE Take 5 mg by mouth every 4-6 hours PRN severe pain   psyllium 58.6 % packet Commonly known as:  METAMUCIL Take 1 packet by mouth daily.   VISINE OP Apply 1 drop to eye daily as needed (dry eyes).   warfarin 7.5 MG tablet Commonly known as:  COUMADIN Take 1 tablet (7.5 mg total) by mouth one time only at 6 PM. Or as directed.      Follow-up Information    Purcell Nails, MD .   Specialty:  Cardiothoracic Surgery Why:  Appointment to see the surgeon on 12/28/2015 at 1:30 PM. Please obtain a chest x-ray [facility] at 1 PM. [facility] imaging is located in the same office complex. Contact information: [address] [employer] [address] [place] [address] [phone]        Azalee Course, [place] .   Specialties:  Cardiology, Radiology Why:  Cardiology follow-up appointment on 12/09/2015 at 3 PM. Contact information: [address] [place] [address] [phone]        Levert Feinstein, MD .   Specialty:  Oncology Contact information: [address] [place] [address] [phone]         Levert Feinstein, MD .   Specialty:  Oncology Why:  Call to have an appointment for CBC, PT and INR (as is on Coumadin) to be drawn on Firday 12/04/2015 Contact information: [address] [place] [address] [phone]          The patient has been discharged on:   1.Beta Blocker:  Yes [ y  ]                              No   [   ]  If No, reason:  2.Ace Inhibitor/ARB: Yes [   ]                                     No  [ n   ]                                     If No, reason:aacute renal insuff/low BP  3.Statin:   Yes [   ]                  No  [ n  ]                  If No, reason:no history of hypercholesterolemia/CAD  4.Marlowe KaysEcasaValentino Hue:  Yes  [ y  ]                  No   [   ]                  If No, reason: Also on coumadin   Signed: Lawayne Hartig M PA-c 12/02/2015, 8:41 AM

## 2015-11-28 LAB — TYPE AND SCREEN
ABO/RH(D): A POS
Antibody Screen: NEGATIVE
UNIT DIVISION: 0
UNIT DIVISION: 0
Unit division: 0
Unit division: 0

## 2015-11-28 LAB — CBC
HEMATOCRIT: 23.2 % — AB (ref 39.0–52.0)
HEMOGLOBIN: 7.8 g/dL — AB (ref 13.0–17.0)
MCH: 29.2 pg (ref 26.0–34.0)
MCHC: 33.6 g/dL (ref 30.0–36.0)
MCV: 86.9 fL (ref 78.0–100.0)
Platelets: 29 10*3/uL — CL (ref 150–400)
RBC: 2.67 MIL/uL — ABNORMAL LOW (ref 4.22–5.81)
RDW: 13.1 % (ref 11.5–15.5)
WBC: 7 10*3/uL (ref 4.0–10.5)

## 2015-11-28 LAB — BASIC METABOLIC PANEL
Anion gap: 8 (ref 5–15)
BUN: 20 mg/dL (ref 6–20)
CHLORIDE: 100 mmol/L — AB (ref 101–111)
CO2: 24 mmol/L (ref 22–32)
Calcium: 7.8 mg/dL — ABNORMAL LOW (ref 8.9–10.3)
Creatinine, Ser: 1.32 mg/dL — ABNORMAL HIGH (ref 0.61–1.24)
GFR calc Af Amer: >60 mL/min (ref >60)
GFR calc non Af Amer: 58 mL/min — ABNORMAL LOW (ref >60)
GLUCOSE: 153 mg/dL — AB (ref 65–99)
POTASSIUM: 3.9 mmol/L (ref 3.5–5.1)
Sodium: 132 mmol/L — ABNORMAL LOW (ref 135–145)

## 2015-11-28 LAB — PROTIME-INR
INR: 1.27
Prothrombin Time: 15.9 seconds — ABNORMAL HIGH (ref 11.4–15.2)

## 2015-11-28 LAB — HEPARIN LEVEL (UNFRACTIONATED)
Heparin Unfractionated: 0.37 IU/mL (ref 0.30–0.70)
Heparin Unfractionated: 0.34 IU/mL (ref 0.30–0.70)

## 2015-11-28 NOTE — Progress Notes (Signed)
CRITICAL VALUE ALERT  Critical value received:  Platelets 29 K/ul   Date of notification:  11/28/2015  Time of notification:  03:38  Critical value read back:yes  Nurse who received alert: Dushaun Okey  MD notified (1st page): Per Rapid RN;  page MD at about 06:00 am.   Time of first page:  06:00 MD on call notified, no new orders at this time.  MD notified (2nd page):  Time of second page:  Responding MD:    Time MD responded:

## 2015-11-28 NOTE — Progress Notes (Addendum)
      [address]       [place] [id number]             [phone]        4 Days Post-Op Procedure(s) (LRB): EXCISION OF AORTIC VALVE MASS  (N/A) AORTIC VALVE REPLACEMENT (N/A) TRANSESOPHAGEAL ECHOCARDIOGRAM (TEE) (N/A) CORONARY ARTERY BYPASS GRAFTING (CABG)x 2 WITH ENDOSCOPIC HARVESTING OF RIGHT SAPHENOUS VEIN -SVG to LAD -SVG to OM1 (N/A)  Subjective: Patient slowly feeling better.  Objective: Vital signs in last 24 hours: Temp:  [98 F (36.7 C)-98.4 F (36.9 C)] 98.2 F (36.8 C) (10/28 0505) Pulse Rate:  [80-93] 80 (10/28 0505) Cardiac Rhythm: Normal sinus rhythm (10/28 0700) Resp:  [18-28] 18 (10/28 0505) BP: (94-118)/(65-79) 94/67 (10/28 0505) SpO2:  [93 %-100 %] 93 % (10/28 0505) Weight:  [195 lb 1.6 oz (88.5 kg)] 195 lb 1.6 oz (88.5 kg) (10/28 0505)  Pre op weight 85 kg Current Weight  11/28/15 195 lb 1.6 oz (88.5 kg)       Intake/Output from previous day: 10/27 0701 - 10/28 0700 In: 100 [P.O.:100] Out: 52 [Urine:2; Chest Tube:50]   Physical Exam:  Cardiovascular: RRR, no murmur Pulmonary: Slightly diminished at bases Abdomen: Soft, non tender, bowel sounds present. Extremities: Mild bilateral lower extremity edema. Wounds: Clean and dry.  No erythema or signs of infection.  Lab Results: CBC: Recent Labs  11/27/15 0237 11/28/15 0255  WBC 7.0 7.0  HGB 8.0* 7.8*  HCT 23.4* 23.2*  PLT 33* 29*   BMET:  Recent Labs  11/27/15 0237 11/28/15 0255  NA 134* 132*  K 4.3 3.9  CL 104 100*  CO2 26 24  GLUCOSE 145* 153*  BUN 19 20  CREATININE 1.32* 1.32*  CALCIUM 7.8* 7.8*    PT/INR:  Lab Results  Component Value Date   INR 1.27 11/28/2015   INR 1.23 11/27/2015   INR 1.22 11/26/2015   ABG:  INR: Will add last result for INR, ABG once components are confirmed Will add last 4 CBG results once components are confirmed  Assessment/Plan:  1. CV - SR in the 80's this am. On Lopressor 12.5 mg bid, Coumadin, and Heparin  drip. INR 1.27. If INR decreased in am, will have to increase Coumadin. 2.  Pulmonary - On room air.  Chest tube with 50 cc of output last 12 hours. Chest tube to water seal. Will leave for today and if low output continues, will remove in am. Encourage incentive spirometer. 3. Volume Overload - On Lasix 40 mg daily. 4. Anemia-H and H relatively stable at 7.8 and 23.2. If decreases in am,likely transfuse. Continue Niferex and Foltx 4.  Primary antiphospholipid antibody syndrome-Dr. Cyndie ChimeGranfortuna following. 5. Hypercoaguable state due to above-on Heparin drip and Coumadin. Will need lifelong anticoagulation. 6. Platelets 29,000, machine count. 7. AKI-Creatinine remains 1.32  ZIMMERMAN,DONIELLE MPA-C 11/28/2015,7:50 AM  Likely d/c chest tube in am No evidence of bleeding , inr still not therapeutic  I have seen and examined Brian Lloyd and agree with the above assessment  and plan.  Delight OvensEdward B Elio Haden MD Beeper [phone] Office [phone] 11/28/2015 10:39 AM

## 2015-11-28 NOTE — Progress Notes (Addendum)
ANTICOAGULATION CONSULT NOTE - Follow Up Consult  Pharmacy Consult for Heparin  Indication: antiphospholipid Ab syndrome, hx DVT, s/p AVR  Allergies  Allergen Reactions  . Naproxen Anaphylaxis    Patient Measurements: Height: 5\' 8"  (172.7 cm) Weight: 197 lb 1.5 oz (89.4 kg) IBW/kg (Calculated) : 68.4  Vital Signs: Temp: 98.2 F (36.8 C) (10/27 1900) Temp Source: Oral (10/27 1900) BP: 95/65 (10/27 1900) Pulse Rate: 93 (10/27 1900)  Labs:  Recent Labs  11/25/15 1658  11/26/15 0330  11/27/15 0237 11/27/15 1642 11/28/15 0255  HGB  --   --  8.2*  --  8.0*  --  7.8*  HCT  --   --  24.0*  --  23.4*  --  23.2*  PLT  --   --  33*  --  33*  --  PENDING  LABPROT  --   --  15.5*  --  15.6*  --  15.9*  INR  --   --  1.22  --  1.23  --  1.27  HEPARINUNFRC  --   < > 0.27*  < > <0.10* 0.27* 0.37  CREATININE 1.17  --  1.19  --  1.32*  --   --   < > = values in this interval not displayed.  Estimated Creatinine Clearance: 67.1 mL/min (by C-G formula based on SCr of 1.32 mg/dL (H)).   Assessment: Heparin while INR is sub-therapeutic, INR remains low at 1.27 this AM, anti-Xa therapeutic x 1 after rate increase  Goal of Therapy:  Heparin level 0.3-0.5 units/mL Monitor platelets by anticoagulation protocol: Yes   Plan:  -Cont heparin 1800 units/hr -1200 HL  Crystalmarie Yasin 11/28/2015,3:40 AM

## 2015-11-28 NOTE — Progress Notes (Signed)
Hematology: Minor decrease in Hb & machine calculated platelet count  Compared with previous values. Back on Heparin  Day 4 coumadin  Continue to monitor closely  Low threshold to transfuse 1 unit if further fall in Hb.

## 2015-11-28 NOTE — Progress Notes (Signed)
ANTICOAGULATION CONSULT NOTE  Pharmacy Consult for heparin Indication: antiphospholipid antibody syndrome, DVT history  Heparin Dosing Weight: 85.6kg   Assessment: 55 yom with history of antiphospholipid antibody syndrome, DVT history continuing on IV heparin per Pharmacy and warfarin per MD dosing. Pt is s/p excision of AV mass, AVR, and CABG x2 on 11/24/15 and MD is aware of thrombocytopenia. INR 1.27. Hg low stable 7.8, TCP with clumping plt (29) - [place] ok with continuing anticoagulation.  Heparin level remains therapeutic at lower goal (0.34) on 1800 units/h. No bleed documented.  Goal of Therapy:  INR 2-3 Heparin level 0.3-0.5 units/ml Monitor platelets by anticoagulation protocol: Yes   Plan:  Heparin at 1800 units/hr- d/c when INR therapeutic Warfarin 5mg  daily ordered per TCTS Daily heparin level/INR/CBC Monitor for s/sx bleeding   Babs BertinHaley Brance Dartt, PharmD, BCPS Clinical Pharmacist 11/28/2015 10:43 AM

## 2015-11-29 ENCOUNTER — Inpatient Hospital Stay (HOSPITAL_COMMUNITY): Payer: BLUE CROSS/BLUE SHIELD

## 2015-11-29 LAB — AEROBIC/ANAEROBIC CULTURE W GRAM STAIN (SURGICAL/DEEP WOUND): Culture: NO GROWTH

## 2015-11-29 LAB — PROTIME-INR
INR: 1.44
Prothrombin Time: 17.7 seconds — ABNORMAL HIGH (ref 11.4–15.2)

## 2015-11-29 LAB — CBC
HEMATOCRIT: 21.8 % — AB (ref 39.0–52.0)
Hemoglobin: 7.3 g/dL — ABNORMAL LOW (ref 13.0–17.0)
MCH: 29.2 pg (ref 26.0–34.0)
MCHC: 33.5 g/dL (ref 30.0–36.0)
MCV: 87.2 fL (ref 78.0–100.0)
Platelets: 24 10*3/uL — CL (ref 150–400)
RBC: 2.5 MIL/uL — ABNORMAL LOW (ref 4.22–5.81)
RDW: 12.9 % (ref 11.5–15.5)
WBC: 6.7 10*3/uL (ref 4.0–10.5)

## 2015-11-29 LAB — AEROBIC/ANAEROBIC CULTURE (SURGICAL/DEEP WOUND)

## 2015-11-29 LAB — HEPARIN LEVEL (UNFRACTIONATED): HEPARIN UNFRACTIONATED: 0.39 [IU]/mL (ref 0.30–0.70)

## 2015-11-29 LAB — PREPARE RBC (CROSSMATCH)

## 2015-11-29 MED ORDER — SODIUM CHLORIDE 0.9 % IV SOLN: Freq: Once | INTRAVENOUS | Status: AC

## 2015-11-29 MED ORDER — SODIUM CHLORIDE 0.9 % IV SOLN
Freq: Once | INTRAVENOUS | Status: AC
  Administered 2015-11-29: 10 mL/h via INTRAVENOUS

## 2015-11-29 NOTE — Progress Notes (Addendum)
      [address]       [place] [id number]             [phone]        5 Days Post-Op Procedure(s) (LRB): EXCISION OF AORTIC VALVE MASS  (N/A) AORTIC VALVE REPLACEMENT (N/A) TRANSESOPHAGEAL ECHOCARDIOGRAM (TEE) (N/A) CORONARY ARTERY BYPASS GRAFTING (CABG)x 2 WITH ENDOSCOPIC HARVESTING OF RIGHT SAPHENOUS VEIN -SVG to LAD -SVG to OM1 (N/A)  Subjective: Patient without complaints this am-hopes tube can come out today.  Objective: Vital signs in last 24 hours: Temp:  [98 F (36.7 C)-98.4 F (36.9 C)] 98 F (36.7 C) (10/29 0528) Pulse Rate:  [78-86] 86 (10/29 0528) Cardiac Rhythm: Normal sinus rhythm (10/29 0700) Resp:  [17-18] 18 (10/29 0528) BP: (98-112)/(58-70) 112/69 (10/29 0528) SpO2:  [95 %-100 %] 100 % (10/29 0528) Weight:  [194 lb 4.8 oz (88.1 kg)] 194 lb 4.8 oz (88.1 kg) (10/29 0528)  Pre op weight 85 kg Current Weight  11/29/15 194 lb 4.8 oz (88.1 kg)       Intake/Output from previous day: 10/28 0701 - 10/29 0700 In: 3 [I.V.:3] Out: 750 [Urine:700; Chest Tube:50]   Physical Exam:  Cardiovascular: RRR, no murmur Pulmonary: Slightly diminished at bases Abdomen: Soft, non tender, bowel sounds present. Extremities: Mild bilateral lower extremity edema. Wounds: Clean and dry.  No erythema or signs of infection. Mediastinal tube-to water seal, 130 cc last 24 hours of sanguinous output  Lab Results: CBC:  Recent Labs  11/28/15 0255 11/29/15 0255  WBC 7.0 6.7  HGB 7.8* 7.3*  HCT 23.2* 21.8*  PLT 29* 24*   BMET:   Recent Labs  11/27/15 0237 11/28/15 0255  NA 134* 132*  K 4.3 3.9  CL 104 100*  CO2 26 24  GLUCOSE 145* 153*  BUN 19 20  CREATININE 1.32* 1.32*  CALCIUM 7.8* 7.8*    PT/INR:  Lab Results  Component Value Date   INR 1.44 11/29/2015   INR 1.27 11/28/2015   INR 1.23 11/27/2015   ABG:  INR: Will add last result for INR, ABG once components are confirmed Will add last 4 CBG results once components are  confirmed  Assessment/Plan:  1. CV - SR in the 80's this am. On Lopressor 12.5 mg bid, Coumadin, and Heparin drip. INR increased from 1.27 to 1.44. Continue with 5 mg of Coumadin. 2.  Pulmonary - On room air.  Chest tube with 130 cc of output last 24 hours. Mediastinal tube to water seal. As discussed with Dr. Tyrone SageGerhardt, remove  tube. Check CXR in am. Encourage incentive spirometer. 3. Volume Overload - On Lasix 40 mg daily. 4. Anemia-H and H decreased to 7.3 and 21.82. Likely needs to be transfused. Continue Niferex and Foltx 4.  Primary antiphospholipid antibody syndrome-Dr. Cyndie ChimeGranfortuna following. 5. Hypercoaguable state due to above-on Heparin drip and Coumadin. Will need lifelong anticoagulation. 6. Thrombocytopenia-platelets 24,000, machine count. 7. AKI-Creatinine remains 1.32  ZIMMERMAN,Brian Lloyd 11/29/2015,7:32 AM   To get prbs and plts today  D/c mt I have seen and examined Brian Lloyd and agree with the above assessment  and plan.  Delight OvensEdward B Everleigh Colclasure MD Beeper [phone] Office [phone] 11/29/2015 11:43 AM

## 2015-11-29 NOTE — Progress Notes (Signed)
Removed chest tube and applied petroleum gauze and 4x4 gauze.  Patient also getting I unit of platelets.  Vital signs stable and documented. Pt resting with call bell within reach.  Will continue to monitor.

## 2015-11-29 NOTE — Progress Notes (Signed)
ANTICOAGULATION CONSULT NOTE  Pharmacy Consult for heparin Indication: antiphospholipid antibody syndrome, DVT history  Heparin Dosing Weight: 85.6kg   Assessment: 55 yom with history of antiphospholipid antibody syndrome, DVT history continuing on IV heparin per Pharmacy and warfarin per MD dosing. Pt is s/p excision of AV mass, AVR, and CABG x2 on 11/24/15 and MD is aware of thrombocytopenia. INR up 1.44. Hg down 7.3, TCP with clumping plt (24) - [place] ok with continuing anticoagulation. To transfuse 1u PRBC today.  Heparin level remains therapeutic at lower goal (0.39) on 1800 units/h. No bleed documented.  Goal of Therapy:  INR 2-3 Heparin level 0.3-0.5 units/ml Monitor platelets by anticoagulation protocol: Yes   Plan:  Heparin at 1800 units/hr- d/c when INR therapeutic Warfarin 5mg  daily ordered per TCTS Daily heparin level/INR/CBC Monitor for s/sx bleeding   Babs BertinHaley Dysen Edmondson, PharmD, BCPS Clinical Pharmacist 11/29/2015 10:44 AM

## 2015-11-29 NOTE — Progress Notes (Signed)
Hematology: Further decrease in Hb & platelets. Hard to know what exact platelet count is. If he is agreeable, I would transfuse one unit each of rbcs & platelets today.

## 2015-11-30 ENCOUNTER — Inpatient Hospital Stay (HOSPITAL_COMMUNITY): Payer: BLUE CROSS/BLUE SHIELD

## 2015-11-30 ENCOUNTER — Encounter: Payer: BLUE CROSS/BLUE SHIELD | Admitting: Thoracic Surgery (Cardiothoracic Vascular Surgery)

## 2015-11-30 LAB — CBC
HCT: 24.8 % — ABNORMAL LOW (ref 39.0–52.0)
Hemoglobin: 8.3 g/dL — ABNORMAL LOW (ref 13.0–17.0)
MCH: 28.8 pg (ref 26.0–34.0)
MCHC: 33.5 g/dL (ref 30.0–36.0)
MCV: 86.1 fL (ref 78.0–100.0)
PLATELETS: 36 10*3/uL — AB (ref 150–400)
RBC: 2.88 MIL/uL — AB (ref 4.22–5.81)
RDW: 13.2 % (ref 11.5–15.5)
WBC: 7.5 10*3/uL (ref 4.0–10.5)

## 2015-11-30 LAB — PROTIME-INR
INR: 1.51
PROTHROMBIN TIME: 18.3 s — AB (ref 11.4–15.2)

## 2015-11-30 LAB — PREPARE PLATELET PHERESIS: UNIT DIVISION: 0

## 2015-11-30 LAB — TYPE AND SCREEN
ABO/RH(D): A POS
ANTIBODY SCREEN: NEGATIVE
Unit division: 0

## 2015-11-30 LAB — HEPARIN LEVEL (UNFRACTIONATED): Heparin Unfractionated: 0.39 IU/mL (ref 0.30–0.70)

## 2015-11-30 LAB — SAVE SMEAR

## 2015-11-30 MED ORDER — WARFARIN SODIUM 7.5 MG PO TABS
7.5000 mg | ORAL_TABLET | Freq: Every day | ORAL | Status: DC
  Administered 2015-11-30 – 2015-12-01 (×2): 7.5 mg via ORAL
  Filled 2015-11-30 (×2): qty 1

## 2015-11-30 NOTE — Progress Notes (Signed)
Patient lying in bed, no needs at this time. Call light within reach. 

## 2015-11-30 NOTE — Progress Notes (Signed)
[address]       [place] [id number]             [phone]      6 Days Post-Op Procedure(s) (LRB): EXCISION OF AORTIC VALVE MASS  (N/A) AORTIC VALVE REPLACEMENT (N/A) TRANSESOPHAGEAL ECHOCARDIOGRAM (TEE) (N/A) CORONARY ARTERY BYPASS GRAFTING (CABG)x 2 WITH ENDOSCOPIC HARVESTING OF RIGHT SAPHENOUS VEIN -SVG to LAD -SVG to OM1 (N/A) Subjective: Feeling stronger, appetite improving, ambulation-good  Objective: Vital signs in last 24 hours: Temp:  [97.6 F (36.4 C)-98.6 F (37 C)] 98.3 F (36.8 C) (10/30 0537) Pulse Rate:  [73-95] 95 (10/30 0537) Cardiac Rhythm: Normal sinus rhythm (10/30 0700) Resp:  [18-19] 18 (10/30 0537) BP: (92-113)/(61-76) 113/63 (10/30 0537) SpO2:  [95 %-100 %] 100 % (10/30 0537) Weight:  [193 lb 9.6 oz (87.8 kg)] 193 lb 9.6 oz (87.8 kg) (10/30 0537)  Hemodynamic parameters for last 24 hours:    Intake/Output from previous day: 10/29 0701 - 10/30 0700 In: 732 [Blood:732] Out: 150 [Urine:150] Intake/Output this shift: No intake/output data recorded.  General appearance: alert, cooperative and no distress Heart: regular rate and rhythm and no murmur Lungs: clear to auscultation bilaterally Abdomen: benign Extremities: no edema Wound: incis healing well  Lab Results:  Recent Labs  11/29/15 0255 11/30/15 0218  WBC 6.7 7.5  HGB 7.3* 8.3*  HCT 21.8* 24.8*  PLT 24* 36*   BMET:  Recent Labs  11/28/15 0255  NA 132*  K 3.9  CL 100*  CO2 24  GLUCOSE 153*  BUN 20  CREATININE 1.32*  CALCIUM 7.8*    PT/INR:  Recent Labs  11/30/15 0220  LABPROT 18.3*  INR 1.51   ABG    Component Value Date/Time   PHART 7.365 11/24/2015 2213   HCO3 23.7 11/24/2015 2213   TCO2 24 11/25/2015 1656   ACIDBASEDEF 1.0 11/24/2015 2213   O2SAT 96.0 11/24/2015 2213   CBG (last 3)  No results for input(s): GLUCAP in the last 72 hours.  Meds Scheduled Meds: . aspirin EC  81 mg Oral Daily  . bisacodyl  10 mg Oral Daily   Or   . bisacodyl  10 mg Rectal Daily  . docusate sodium  200 mg Oral Daily  . folic acid-pyridoxine-cyancobalamin  1 tablet Oral Daily  . iron polysaccharides  150 mg Oral Daily  . metoprolol tartrate  12.5 mg Oral BID  . pantoprazole  40 mg Oral Daily  . sodium chloride flush  10-40 mL Intracatheter Q12H  . sodium chloride flush  3 mL Intravenous Q12H  . warfarin  7.5 mg Oral q1800  . Warfarin - Physician Dosing Inpatient   Does not apply q1800   Continuous Infusions: . sodium chloride    . heparin 1,800 Units/hr (11/29/15 2002)   PRN Meds:.metoprolol, morphine injection, ondansetron (ZOFRAN) IV, oxyCODONE, sodium chloride flush, sodium chloride flush, traMADol  Xrays Dg Chest 2 View  Result Date: 11/30/2015 CLINICAL DATA:  55 year old male post valve replacement. Subsequent encounter. EXAM: CHEST  2 VIEW COMPARISON:  11/29/2015 and 10/26/2015 chest x-ray. FINDINGS: Post valve replacement day CABG.  Heart size top-normal. Pulmonary vascular prominence most notable centrally. Small pleural effusions. Left base subsegmental atelectasis. No pneumothorax detected. IMPRESSION: Post valve replacement and CABG. Pulmonary vascular prominence most notable centrally with small pleural effusions. Left base subsegmental atelectasis. No pneumothorax detected. Electronically Signed   By: Lacy DuverneySteven  Olson M.D.   On: 11/30/2015 07:20   [facility]  Result Date: 11/29/2015 CLINICAL DATA:  Pleural effusion, 5 days status post aortic valve replacement EXAM: PORTABLE CHEST 1 VIEW COMPARISON:  11/26/2015 chest radiograph. FINDINGS: Sternotomy wires appear aligned and intact. Aortic valve prosthesis is in place. Stable cardiomediastinal silhouette with mild cardiomegaly. No pneumothorax. Stable trace bilateral pleural effusions. No overt pulmonary edema. Mild left basilar atelectasis, stable. IMPRESSION: 1. Stable trace bilateral pleural effusions.  No pneumothorax . 2. Stable mild cardiomegaly without overt  pulmonary edema . 3. Stable mild left basilar atelectasis. Electronically Signed   By: Delbert PhenixJason A Poff M.D.   On: 11/29/2015 10:15    Assessment/Plan: S/P Procedure(s) (LRB): EXCISION OF AORTIC VALVE MASS  (N/A) AORTIC VALVE REPLACEMENT (N/A) TRANSESOPHAGEAL ECHOCARDIOGRAM (TEE) (N/A) CORONARY ARTERY BYPASS GRAFTING (CABG)x 2 WITH ENDOSCOPIC HARVESTING OF RIGHT SAPHENOUS VEIN -SVG to LAD -SVG to OM1 (N/A)  1 doing well 2 coumadin as per Dr Cyndie ChimeGranfortuna 3 hemodyn stable in sinus rhythm, BP runs a little low at times 4 platelet ct up a little to 36K 5 poss home in am  LOS: 6 days    Elfie Costanza,Ean E 11/30/2015

## 2015-11-30 NOTE — Progress Notes (Signed)
ANTICOAGULATION CONSULT NOTE - Follow Up Consult  Pharmacy Consult for Heparin Indication: antiphospholipid syndrome, DVT history  Allergies  Allergen Reactions  . Naproxen Anaphylaxis    Patient Measurements: Height: 5\' 8"  (172.7 cm) Weight: 193 lb 9.6 oz (87.8 kg) IBW/kg (Calculated) : 68.4 Heparin Dosing Weight: 85.6kg  Vital Signs: Temp: 98.3 F (36.8 C) (10/30 0537) Temp Source: Oral (10/30 0537) BP: 113/63 (10/30 0537) Pulse Rate: 95 (10/30 0537)  Labs:  Recent Labs  11/28/15 0255 11/28/15 1103 11/29/15 0255 11/30/15 0218 11/30/15 0220  HGB 7.8*  --  7.3* 8.3*  --   HCT 23.2*  --  21.8* 24.8*  --   PLT 29*  --  24* 36*  --   LABPROT 15.9*  --  17.7*  --  18.3*  INR 1.27  --  1.44  --  1.51  HEPARINUNFRC 0.37 0.34 0.39  --  0.39  CREATININE 1.32*  --   --   --   --     Estimated Creatinine Clearance: 66.5 mL/min (by C-G formula based on SCr of 1.32 mg/dL (H)).   Medications:  Heparin @ 1800 units/hr  Assessment: 55 yom with history of antiphospholipid antibody syndrome, DVT history continuing on IV heparin per Rx and coumadin per MD dosing. Pt is s/p excision of AV mass, AVR, and CABG x2 on 11/24/15 and MD is aware of thrombocytopenia. INR 1.55 after 5 doses of 5mg . Heparin level therapeutic at 0.39. Platelets and Hgb both improved after 1 unit PRBCs and 1 unit platelets yesterday. No bleeding.  Goal of Therapy:  INR 2-3 Heparin level 0.3-0.5 units/ml Monitor platelets by anticoagulation protocol: Yes   Plan:  1) Continue heparin at 1800 units/hr - d/c when INR therapeutic 2) Coumadin increased to 7.5mg  per TCTS 3) Daily heparin level, INR, CBC  Brian Lloyd, Brian Lloyd 11/30/2015,9:08 AM

## 2015-11-30 NOTE — Progress Notes (Signed)
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 Rec: Increase coumadin to 7.5 mg today then alternate 5mg  daily w 7.5 mg daily Post D/C, I will follow in my Internal Med coumadin clinic Call Gentry Fitzoris Solomon [phone] to schedule appt. Thanks

## 2015-11-30 NOTE — Progress Notes (Signed)
CARDIAC REHAB PHASE I   PRE:  Rate/Rhythm: 80 SR  BP:  Supine:   Sitting: 107/74  Standing:    SaO2: 99%RA  MODE:  Ambulation: 690 ft   POST:  Rate/Rhythm: 93 SR  BP:  Supine:   Sitting: 127/88  Standing:    SaO2: 99%RA 1020-1047 Pt walked 690 ft independently with steady gait. Tolerated well. Reviewed sternal precautions with pt and wife. Will make sure wife present for ed at discharge. To recliner.   Luetta Nuttingharlene Damarie Schoolfield, RN BSN  11/30/2015 10:43 AM

## 2015-12-01 LAB — CBC
HEMATOCRIT: 25 % — AB (ref 39.0–52.0)
Hemoglobin: 8.4 g/dL — ABNORMAL LOW (ref 13.0–17.0)
MCH: 29.3 pg (ref 26.0–34.0)
MCHC: 33.6 g/dL (ref 30.0–36.0)
MCV: 87.1 fL (ref 78.0–100.0)
Platelets: 21 10*3/uL — CL (ref 150–400)
RBC: 2.87 MIL/uL — ABNORMAL LOW (ref 4.22–5.81)
RDW: 13.6 % (ref 11.5–15.5)
WBC: 7.7 10*3/uL (ref 4.0–10.5)

## 2015-12-01 LAB — HEPARIN LEVEL (UNFRACTIONATED): HEPARIN UNFRACTIONATED: 0.33 [IU]/mL (ref 0.30–0.70)

## 2015-12-01 LAB — PROTIME-INR
INR: 1.41
Prothrombin Time: 17.4 seconds — ABNORMAL HIGH (ref 11.4–15.2)

## 2015-12-01 NOTE — Progress Notes (Signed)
Critical lab value--platelets 21

## 2015-12-01 NOTE — Progress Notes (Signed)
Hematology: Platelets down to 21,000. Smear review from 10/30: approx 5 platelets/HPF = 60-75,000. Hb holding @8 .4. INR remains subtherapeutic day # 7 coumadin. Exam large ecchymosis left forearm site of priot ABG? Rec: I would keep pt in hospital Increase coumadin to 7.5 mg daily. I am reluctant to switch to lovenox in view of low platelets since we can reverse UFH quickly if he bleeds. We are walking a tightrope.   Cephas DarbyJames Osie Merkin, MD, FACP  Hematology-Oncology/Internal Medicine

## 2015-12-01 NOTE — Progress Notes (Signed)
CARDIAC REHAB PHASE I   PRE:  Rate/Rhythm: 75 SR  BP:  Supine:   Sitting: 117/72  Standing:    SaO2: 99%RA  MODE:  Ambulation: 890 ft   POST:  Rate/Rhythm: 99 SR  BP:  Supine:   Sitting: 118/75  Standing:    SaO2: 98%RA 1255-1314 Pt walked 890 ft with steady gait and tolerated well. Stopped once to rest.   Brian Nuttingharlene Emali Heyward, RN BSN  12/01/2015 1:10 PM

## 2015-12-01 NOTE — Progress Notes (Signed)
Patient sitting up in chair, pain medication already given. Call light within reach

## 2015-12-01 NOTE — Progress Notes (Signed)
ANTICOAGULATION CONSULT NOTE - Follow Up Consult  Pharmacy Consult for Heparin Indication: antiphospholipid syndrome, DVT history  Allergies  Allergen Reactions  . Naproxen Anaphylaxis    Patient Measurements: Height: 5\' 8"  (172.7 cm) Weight: 193 lb 9.6 oz (87.8 kg) IBW/kg (Calculated) : 68.4 Heparin Dosing Weight: 85.6kg  Vital Signs: Temp: 97.3 F (36.3 C) (10/31 0553) Temp Source: Oral (10/31 0553) BP: 125/66 (10/31 0553) Pulse Rate: 78 (10/31 0553)  Labs:  Recent Labs  11/29/15 0255 11/30/15 0218 11/30/15 0220 12/01/15 0229  HGB 7.3* 8.3*  --  8.4*  HCT 21.8* 24.8*  --  25.0*  PLT 24* 36*  --  21*  LABPROT 17.7*  --  18.3* 17.4*  INR 1.44  --  1.51 1.41  HEPARINUNFRC 0.39  --  0.39 0.33    Estimated Creatinine Clearance: 66.5 mL/min (by C-G formula based on SCr of 1.32 mg/dL (H)).   Medications:  Heparin @ 1800 units/hr  Assessment: 55 yom with history of antiphospholipid antibody syndrome, DVT history continuing on IV heparin per Rx and coumadin per MD dosing. Pt is s/p excision of AV mass, AVR, and CABG x2 on 11/24/15 and MD is aware of thrombocytopenia. INR down to 1.41 - coumadin dose increased to 7.5mg  yesterday. Heparin level therapeutic at 0.33. Hgb stable 8.4, platelets falling again to 21 - got 1 unit PRBCs and 1unit platelets on 10/29. No bleeding.  Goal of Therapy:  INR 2-3 Heparin level 0.3-0.5 units/ml Monitor platelets by anticoagulation protocol: Yes   Plan:  1) Continue heparin at 1800 units/hr - d/c when INR therapeutic 2) Coumadin 7.5mg  per TCTS 3) Daily heparin level, INR, CBC  Fredrik RiggerMarkle, Brian Lloyd 12/01/2015,8:26 AM

## 2015-12-01 NOTE — Progress Notes (Addendum)
      [address]       [place] [id number]             [phone]        7 Days Post-Op Procedure(s) (LRB): EXCISION OF AORTIC VALVE MASS  (N/A) AORTIC VALVE REPLACEMENT (N/A) TRANSESOPHAGEAL ECHOCARDIOGRAM (TEE) (N/A) CORONARY ARTERY BYPASS GRAFTING (CABG)x 2 WITH ENDOSCOPIC HARVESTING OF RIGHT SAPHENOUS VEIN -SVG to LAD -SVG to OM1 (N/A)  Subjective: Patient sleeping in chair this am. He woke up and said he had a "great nap". He has no complaints.  Objective: Vital signs in last 24 hours: Temp:  [97.3 F (36.3 C)-98.5 F (36.9 C)] 97.3 F (36.3 C) (10/31 0553) Pulse Rate:  [75-80] 78 (10/31 0553) Cardiac Rhythm: Normal sinus rhythm (10/30 1900) Resp:  [18] 18 (10/31 0553) BP: (106-127)/(65-88) 125/66 (10/31 0553) SpO2:  [95 %-99 %] 95 % (10/31 0553) Weight:  [193 lb 9.6 oz (87.8 kg)] 193 lb 9.6 oz (87.8 kg) (10/31 0553)  Pre op weight 85 kg Current Weight  12/01/15 193 lb 9.6 oz (87.8 kg)       Intake/Output from previous day: 10/30 0701 - 10/31 0700 In: 960 [P.O.:960] Out: 200 [Urine:200]   Physical Exam:  Cardiovascular: RRR, no murmur Pulmonary: Slightly diminished at bases Abdomen: Soft, non tender, bowel sounds present. Extremities: Mild bilateral lower extremity edema. Wounds: Clean and dry.  No erythema or signs of infection.   Lab Results: CBC:  Recent Labs  11/30/15 0218 12/01/15 0229  WBC 7.5 7.7  HGB 8.3* 8.4*  HCT 24.8* 25.0*  PLT 36* 21*   BMET:  No results for input(s): NA, K, CL, CO2, GLUCOSE, BUN, CREATININE, CALCIUM in the last 72 hours.  PT/INR:  Lab Results  Component Value Date   INR 1.41 12/01/2015   INR 1.51 11/30/2015   INR 1.44 11/29/2015   ABG:  INR: Will add last result for INR, ABG once components are confirmed Will add last 4 CBG results once components are confirmed  Assessment/Plan:  1. CV - SR in the 70's this am. On Lopressor 12.5 mg bid, Coumadin, and Heparin drip. INR decreased  from 1.51 to 1.41. Continue with Coumadin 7.5 as just given last night. 2.  Pulmonary - On room air.   Encourage incentive spirometer. 3. Volume Overload - On Lasix 40 mg daily. 4. Anemia-H and H stable at 8.4 and 25. Had transfusion previously. 5.  Primary antiphospholipid antibody syndrome-Dr. Cyndie ChimeGranfortuna following. 6. Hypercoaguable state due to above-on Heparin drip and Coumadin. Will need lifelong anticoagulation. 7. Thrombocytopenia-platelets decreased to 21,000.   ZIMMERMAN,DONIELLE MPA-C 12/01/2015,7:16 AM  I have seen and examined the patient and agree with the assessment and plan as outlined.  Plan per Dr. Cyndie ChimeGranfortuna.  Ready for hospital D/C other than management of anticoagulation.  Purcell Nailslarence H Jaydian Santana, MD 12/01/2015 11:57 AM

## 2015-12-02 ENCOUNTER — Other Ambulatory Visit: Payer: Self-pay | Admitting: Oncology

## 2015-12-02 DIAGNOSIS — Z7901 Long term (current) use of anticoagulants: Secondary | ICD-10-CM

## 2015-12-02 DIAGNOSIS — D696 Thrombocytopenia, unspecified: Secondary | ICD-10-CM

## 2015-12-02 DIAGNOSIS — I825Z2 Chronic embolism and thrombosis of unspecified deep veins of left distal lower extremity: Secondary | ICD-10-CM

## 2015-12-02 DIAGNOSIS — Z953 Presence of xenogenic heart valve: Secondary | ICD-10-CM

## 2015-12-02 DIAGNOSIS — I639 Cerebral infarction, unspecified: Secondary | ICD-10-CM

## 2015-12-02 DIAGNOSIS — D6861 Antiphospholipid syndrome: Secondary | ICD-10-CM

## 2015-12-02 DIAGNOSIS — M3211 Endocarditis in systemic lupus erythematosus: Secondary | ICD-10-CM

## 2015-12-02 DIAGNOSIS — N182 Chronic kidney disease, stage 2 (mild): Secondary | ICD-10-CM

## 2015-12-02 DIAGNOSIS — I358 Other nonrheumatic aortic valve disorders: Secondary | ICD-10-CM

## 2015-12-02 DIAGNOSIS — Z736 Limitation of activities due to disability: Secondary | ICD-10-CM

## 2015-12-02 LAB — HEPARIN LEVEL (UNFRACTIONATED): HEPARIN UNFRACTIONATED: 0.35 [IU]/mL (ref 0.30–0.70)

## 2015-12-02 LAB — PROTIME-INR
INR: 1.36
Prothrombin Time: 16.9 seconds — ABNORMAL HIGH (ref 11.4–15.2)

## 2015-12-02 LAB — CBC
HCT: 23.8 % — ABNORMAL LOW (ref 39.0–52.0)
HEMOGLOBIN: 7.9 g/dL — AB (ref 13.0–17.0)
MCH: 29.5 pg (ref 26.0–34.0)
MCHC: 33.2 g/dL (ref 30.0–36.0)
MCV: 88.8 fL (ref 78.0–100.0)
Platelets: 31 10*3/uL — ABNORMAL LOW (ref 150–400)
RBC: 2.68 MIL/uL — ABNORMAL LOW (ref 4.22–5.81)
RDW: 14 % (ref 11.5–15.5)
WBC: 7.2 10*3/uL (ref 4.0–10.5)

## 2015-12-02 MED ORDER — METOPROLOL TARTRATE 25 MG PO TABS
12.5000 mg | ORAL_TABLET | Freq: Two times a day (BID) | ORAL | 1 refills | Status: DC
Start: 2015-12-02 — End: 2016-04-18

## 2015-12-02 MED ORDER — ENOXAPARIN SODIUM 100 MG/ML ~~LOC~~ SOLN: 1.0000 mg/kg | Freq: Two times a day (BID) | SUBCUTANEOUS | Status: DC

## 2015-12-02 MED ORDER — WARFARIN SODIUM 7.5 MG PO TABS: 7.5000 mg | ORAL_TABLET | Freq: Once | ORAL | 1 refills | Status: DC

## 2015-12-02 MED ORDER — ENOXAPARIN SODIUM 150 MG/ML ~~LOC~~ SOLN: 1.0000 mg/kg | Freq: Two times a day (BID) | SUBCUTANEOUS | 1 refills | Status: DC

## 2015-12-02 MED ORDER — OXYCODONE HCL 5 MG PO TABS: ORAL_TABLET | ORAL | 0 refills | Status: DC

## 2015-12-02 MED ORDER — FA-PYRIDOXINE-CYANOCOBALAMIN 2.5-25-2 MG PO TABS: 1.0000 | ORAL_TABLET | Freq: Every day | ORAL | 0 refills | Status: DC

## 2015-12-02 MED ORDER — POLYSACCHARIDE IRON COMPLEX 150 MG PO CAPS: 150.0000 mg | ORAL_CAPSULE | Freq: Every day | ORAL | 0 refills | Status: DC

## 2015-12-02 MED ORDER — WARFARIN SODIUM 7.5 MG PO TABS: 7.5000 mg | ORAL_TABLET | Freq: Once | ORAL | Status: DC

## 2015-12-02 MED ORDER — FUROSEMIDE 40 MG PO TABS
40.0000 mg | ORAL_TABLET | Freq: Once | ORAL | Status: AC
  Administered 2015-12-02: 40 mg via ORAL
  Filled 2015-12-02: qty 1

## 2015-12-02 MED ORDER — POTASSIUM CHLORIDE CRYS ER 20 MEQ PO TBCR
20.0000 meq | EXTENDED_RELEASE_TABLET | Freq: Once | ORAL | Status: AC
  Administered 2015-12-02: 20 meq via ORAL
  Filled 2015-12-02: qty 1

## 2015-12-02 NOTE — Progress Notes (Signed)
Hematology: Hb 7.9, platelets 31,000 - machine count - up from 21,000 Day #7 coumadin - remains subtherapeutic INR 1.36 He did get some IV vitamin K 2 weeks ago when we had to reverse his coumadin in anticipation of surgery which may be why it is taking longer to get him therapeutic. Exam stable Rales left lung base; regular rhythm, no murmur; no calf tenderness, no focal neuro deficit No signs of active bleeding Impression: 1.Primary antiphopholipid antibody syndrome 2. 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 PO day 7 bio-AVR for 2 Recommend: I will stop IV heparin Resume lovenox 1 mg/kg SQ BID until coumadin therapeutic Discharge today on coumadin 7.5 mg daily CBC, PT/INR in my office Friday AM  Thanks!!

## 2015-12-02 NOTE — Progress Notes (Addendum)
      [address]       [place] [id number]             [phone]        8 Days Post-Op Procedure(s) (LRB): EXCISION OF AORTIC VALVE MASS  (N/A) AORTIC VALVE REPLACEMENT (N/A) TRANSESOPHAGEAL ECHOCARDIOGRAM (TEE) (N/A) CORONARY ARTERY BYPASS GRAFTING (CABG)x 2 WITH ENDOSCOPIC HARVESTING OF RIGHT SAPHENOUS VEIN -SVG to LAD -SVG to OM1 (N/A)  Subjective: Patient states he really needs to go home-wife's father recently died. He states he will manage his Coumadin etc as instructed.  Objective: Vital signs in last 24 hours: Temp:  [99 F (37.2 C)] 99 F (37.2 C) (10/31 2035) Pulse Rate:  [78-79] 79 (10/31 2035) Cardiac Rhythm: Normal sinus rhythm (10/31 1900) Resp:  [18] 18 (10/31 2035) BP: (96-100)/(67) 100/67 (10/31 2035) SpO2:  [98 %] 98 % (10/31 2035)  Pre op weight 85 kg Current Weight  12/01/15 193 lb 9.6 oz (87.8 kg)       Intake/Output from previous day: 10/31 0701 - 11/01 0700 In: 960 [P.O.:960] Out: -    Physical Exam:  Cardiovascular: RRR, no murmur Pulmonary: Slightly diminished at bases Abdomen: Soft, non tender, bowel sounds present. Extremities: Mild bilateral lower extremity edema. Wounds: Clean and dry.  No erythema or signs of infection.   Lab Results: CBC:  Recent Labs  12/01/15 0229 12/02/15 0348  WBC 7.7 7.2  HGB 8.4* 7.9*  HCT 25.0* 23.8*  PLT 21* 31*   BMET:  No results for input(s): NA, K, CL, CO2, GLUCOSE, BUN, CREATININE, CALCIUM in the last 72 hours.  PT/INR:  Lab Results  Component Value Date   INR 1.36 12/02/2015   INR 1.41 12/01/2015   INR 1.51 11/30/2015   ABG:  INR: Will add last result for INR, ABG once components are confirmed Will add last 4 CBG results once components are confirmed  Assessment/Plan:  1. CV - SR in the 70's this am. On Lopressor 12.5 mg bid, Coumadin, and Heparin drip. INR decreased from 1.41 to 1.36 and given Coumadin 7.5 last 2 nights. Will await Dr. Frederich ChickGranfotuna' s  recommendation. 2.  Pulmonary - On room air.   Encourage incentive spirometer. 3. Volume Overload - On Lasix 40 mg daily. 4. Anemia-H and H decreased to 7.9 and 23.8. Had transfusion previously. 5.  Primary antiphospholipid antibody syndrome-Dr. Cyndie ChimeGranfortuna following. 6. Hypercoaguable state due to above-on Heparin drip and Coumadin. Will need lifelong anticoagulation. 7. Thrombocytopenia-platelets increased to 31,000.   ZIMMERMAN,DONIELLE MPA-C 12/02/2015,7:23 AM  I have seen and examined the patient and agree with the assessment and plan as outlined.  D/C home today.  Anticoagulation per Dr. Cyndie ChimeGranfortuna.  Stop lasix  Purcell Nailslarence H Derrian Poli, MD 12/02/2015 9:00 AM

## 2015-12-02 NOTE — Care Management Note (Signed)
Case Management Note Donn PieriniKristi Genavie Boettger RN, BSN Unit 2W-Case Manager [phone]  Patient Details  Name: Brian Lloyd MRN: [id number] Date of Birth: 29-Aug-1960  Subjective/Objective:  Pt s/p CABG and AVR, tx from ICU to 2W on 11/27/15                 Action/Plan: PTA pt lived at home- plan to return home, no CM needs noted  Expected Discharge Date:     12/03/15             Expected Discharge Plan:  Home/Self Care  In-House Referral:     Discharge planning Services  CM Consult  Post Acute Care Choice:    Choice offered to:     DME Arranged:    DME Agency:     HH Arranged:    HH Agency:     Status of Service:  Completed, signed off  If discussed at [employer] Length of Stay Meetings, dates discussed:    Additional Comments:  Darrold SpanWebster, Ardell Aaronson Hall, RN 12/02/2015, 9:59 AM

## 2015-12-02 NOTE — Progress Notes (Signed)
Discharge instructions reviewed with the patient and family to include medications, prescriptions. Follow up visits and activity. Written material provided.  Both voice understanding to teaching.  To door via wheelchair.  Home via POV with his wife driving.

## 2015-12-02 NOTE — Progress Notes (Signed)
12/02/2015 1100 Chest tube sutures taken out per MD order.  Pt tolerated well. Kathryne HitchAllen, Arslan Kier C

## 2015-12-02 NOTE — Progress Notes (Signed)
[id number] Education completed with pt who voiced understanding. Encouraged IS. Pt eats following vegan and a little chicken. Discussed CRP 2 and [facility]. Put on discharge video for him to view. Luetta NuttingCharlene Sheyli Horwitz RN BSN 12/02/2015 10:27 AM .

## 2015-12-03 ENCOUNTER — Telehealth: Payer: Self-pay

## 2015-12-03 NOTE — Telephone Encounter (Signed)
Brian Lloyd is a 55 y.o. male who was contacted via telephone for monitoring of Enoxaparin and Warfarin therapy.    ASSESSMENT Indication(s): CVA, DVT and PE  Duration: indefinite  Labs:    Component Value Date/Time   AST 52 (H) 11/23/2015 1058   ALT 100 (H) 11/23/2015 1058   NA 132 (L) 11/28/2015 0255   K 3.9 11/28/2015 0255   CL 100 (L) 11/28/2015 0255   CO2 24 11/28/2015 0255   GLUCOSE 153 (H) 11/28/2015 0255   HGBA1C 5.3 11/19/2015 2248   BUN 20 11/28/2015 0255   CREATININE 1.32 (H) 11/28/2015 0255   CREATININE 1.27 10/26/2015 1114   CALCIUM 7.8 (L) 11/28/2015 0255   GFRNONAA 58 (L) 11/28/2015 0255   GFRAA >60 11/28/2015 0255   WBC 7.2 12/02/2015 0348   HGB 7.9 (L) 12/02/2015 0348   HCT 23.8 (L) 12/02/2015 0348   PLT 31 (L) 12/02/2015 0348    Enoxaparin 150 mg/ml injection and Warfarin 7.5 mg daily  Safety: Patient has not had recent bleeding/thromboembolic events. Patient reports no recent signs or symptoms of bleeding, no signs of symptoms of thromboembolism. Medication changes: no.  Renal/hepatic/drug interaction concerns: Monitor Liver Function  Adherence: Patient does correctly recite the dose. Patient reports no known adherence challenges. Patient just started therapy.   Patient Instructions: Patient advised to contact clinic or seek medical attention if signs/symptoms of bleeding or thromboembolism occur. Patient verbalized understanding by repeating back information.  Follow-up Recommended labs to consider: LFTs and CBC . Next appointment: 12/09/15 with Cardiology  Lorenso CourierFrank  Clarkson Rosselli PharmD Candidate  12/03/2015, 9:58 AM

## 2015-12-04 ENCOUNTER — Telehealth: Payer: Self-pay | Admitting: *Deleted

## 2015-12-04 ENCOUNTER — Other Ambulatory Visit: Payer: Self-pay | Admitting: Oncology

## 2015-12-04 ENCOUNTER — Other Ambulatory Visit (INDEPENDENT_AMBULATORY_CARE_PROVIDER_SITE_OTHER): Payer: BLUE CROSS/BLUE SHIELD

## 2015-12-04 DIAGNOSIS — I639 Cerebral infarction, unspecified: Secondary | ICD-10-CM

## 2015-12-04 DIAGNOSIS — D6861 Antiphospholipid syndrome: Secondary | ICD-10-CM

## 2015-12-04 DIAGNOSIS — M3211 Endocarditis in systemic lupus erythematosus: Secondary | ICD-10-CM

## 2015-12-04 DIAGNOSIS — I358 Other nonrheumatic aortic valve disorders: Secondary | ICD-10-CM

## 2015-12-04 DIAGNOSIS — D696 Thrombocytopenia, unspecified: Secondary | ICD-10-CM

## 2015-12-04 DIAGNOSIS — Z7901 Long term (current) use of anticoagulants: Secondary | ICD-10-CM | POA: Diagnosis not present

## 2015-12-04 DIAGNOSIS — I825Z2 Chronic embolism and thrombosis of unspecified deep veins of left distal lower extremity: Secondary | ICD-10-CM

## 2015-12-04 DIAGNOSIS — Z953 Presence of xenogenic heart valve: Secondary | ICD-10-CM

## 2015-12-04 DIAGNOSIS — N182 Chronic kidney disease, stage 2 (mild): Secondary | ICD-10-CM | POA: Diagnosis not present

## 2015-12-04 LAB — CBC WITH DIFFERENTIAL/PLATELET
Basophils Absolute: 0 10*3/uL (ref 0.0–0.1)
Basophils Relative: 0 %
EOS ABS: 0.1 10*3/uL (ref 0.0–0.7)
Eosinophils Relative: 2 %
HEMATOCRIT: 25.4 % — AB (ref 39.0–52.0)
HEMOGLOBIN: 8.4 g/dL — AB (ref 13.0–17.0)
LYMPHS ABS: 0.8 10*3/uL (ref 0.7–4.0)
LYMPHS PCT: 16 %
MCH: 28.9 pg (ref 26.0–34.0)
MCHC: 33.1 g/dL (ref 30.0–36.0)
MCV: 87.3 fL (ref 78.0–100.0)
MONOS PCT: 7 %
Monocytes Absolute: 0.4 10*3/uL (ref 0.1–1.0)
NEUTROS ABS: 4 10*3/uL (ref 1.7–7.7)
NEUTROS PCT: 76 %
Platelets: DECREASED 10*3/uL (ref 150–400)
RBC: 2.91 MIL/uL — AB (ref 4.22–5.81)
RDW: 13.6 % (ref 11.5–15.5)
WBC: 5.3 10*3/uL (ref 4.0–10.5)

## 2015-12-04 LAB — COMPREHENSIVE METABOLIC PANEL
ALBUMIN: 3.3 g/dL — AB (ref 3.5–5.0)
ALT: 79 U/L — AB (ref 17–63)
AST: 42 U/L — AB (ref 15–41)
Alkaline Phosphatase: 79 U/L (ref 38–126)
Anion gap: 5 (ref 5–15)
BILIRUBIN TOTAL: 0.5 mg/dL (ref 0.3–1.2)
BUN: 14 mg/dL (ref 6–20)
CO2: 28 mmol/L (ref 22–32)
CREATININE: 1.38 mg/dL — AB (ref 0.61–1.24)
Calcium: 8.7 mg/dL — ABNORMAL LOW (ref 8.9–10.3)
Chloride: 102 mmol/L (ref 101–111)
GFR calc Af Amer: >60 mL/min (ref >60)
GFR, EST NON AFRICAN AMERICAN: 55 mL/min — AB (ref >60)
GLUCOSE: 120 mg/dL — AB (ref 65–99)
POTASSIUM: 4.3 mmol/L (ref 3.5–5.1)
Sodium: 135 mmol/L (ref 135–145)
TOTAL PROTEIN: 6 g/dL — AB (ref 6.5–8.1)

## 2015-12-04 LAB — PROTIME-INR
INR: 1.77
Prothrombin Time: 20.8 seconds — ABNORMAL HIGH (ref 11.4–15.2)

## 2015-12-04 NOTE — Telephone Encounter (Signed)
Patient was contacted with Frank Tillman, PharmD candidate. I agree with the assessment and plan of care documented.  

## 2015-12-04 NOTE — Telephone Encounter (Signed)
Called pt - lab appt scheduled Monday 11/6 @ 1000AM.

## 2015-12-04 NOTE — Telephone Encounter (Signed)
Pt called / informed "coumadin starting to kick in. Stay on shots over weekend. Continue coumadin 7.5 mg daily. Come in Monday for lab - should be able to stop shots then" per Dr Cyndie ChimeGranfortuna. Pt repeated instructions/ voiced understanding.

## 2015-12-04 NOTE — Telephone Encounter (Signed)
-----   Message from Levert FeinsteinJames M Granfortuna, MD sent at 12/04/2015  2:07 PM EDT ----- Lab posted for Mon 11/6  thx - Glenda will call pt

## 2015-12-04 NOTE — Telephone Encounter (Signed)
-----   Message from Levert FeinsteinJames M Granfortuna, MD sent at 12/04/2015  1:42 PM EDT ----- Call pt: coumadin starting to kick in.  Stay on shots over weekend. Continue coumadin 7.5 mg daily. Come in Monday for lab - should be able to stop shots then

## 2015-12-07 ENCOUNTER — Ambulatory Visit: Payer: BLUE CROSS/BLUE SHIELD | Admitting: Thoracic Surgery (Cardiothoracic Vascular Surgery)

## 2015-12-07 ENCOUNTER — Other Ambulatory Visit (HOSPITAL_COMMUNITY): Payer: BLUE CROSS/BLUE SHIELD

## 2015-12-07 ENCOUNTER — Encounter (HOSPITAL_COMMUNITY): Payer: BLUE CROSS/BLUE SHIELD

## 2015-12-07 ENCOUNTER — Other Ambulatory Visit (INDEPENDENT_AMBULATORY_CARE_PROVIDER_SITE_OTHER): Payer: BLUE CROSS/BLUE SHIELD

## 2015-12-07 ENCOUNTER — Other Ambulatory Visit: Payer: Self-pay | Admitting: Oncology

## 2015-12-07 DIAGNOSIS — D696 Thrombocytopenia, unspecified: Secondary | ICD-10-CM

## 2015-12-07 DIAGNOSIS — D6861 Antiphospholipid syndrome: Secondary | ICD-10-CM

## 2015-12-07 DIAGNOSIS — M3211 Endocarditis in systemic lupus erythematosus: Secondary | ICD-10-CM

## 2015-12-07 DIAGNOSIS — I825Z2 Chronic embolism and thrombosis of unspecified deep veins of left distal lower extremity: Secondary | ICD-10-CM

## 2015-12-07 DIAGNOSIS — I63119 Cerebral infarction due to embolism of unspecified vertebral artery: Secondary | ICD-10-CM

## 2015-12-07 LAB — CBC WITH DIFFERENTIAL/PLATELET
BASOS ABS: 0 10*3/uL (ref 0.0–0.1)
BASOS PCT: 1 %
Eosinophils Absolute: 0.1 10*3/uL (ref 0.0–0.7)
Eosinophils Relative: 2 %
HEMATOCRIT: 28.3 % — AB (ref 39.0–52.0)
HEMOGLOBIN: 9.1 g/dL — AB (ref 13.0–17.0)
LYMPHS PCT: 13 %
Lymphs Abs: 0.8 10*3/uL (ref 0.7–4.0)
MCH: 28.3 pg (ref 26.0–34.0)
MCHC: 32.2 g/dL (ref 30.0–36.0)
MCV: 88.2 fL (ref 78.0–100.0)
Monocytes Absolute: 0.3 10*3/uL (ref 0.1–1.0)
Monocytes Relative: 6 %
NEUTROS ABS: 4.9 10*3/uL (ref 1.7–7.7)
Neutrophils Relative %: 79 %
Platelets: DECREASED 10*3/uL (ref 150–400)
RBC: 3.21 MIL/uL — AB (ref 4.22–5.81)
RDW: 13.6 % (ref 11.5–15.5)
WBC: 6.2 10*3/uL (ref 4.0–10.5)

## 2015-12-07 LAB — PROTIME-INR
INR: 1.3
PROTHROMBIN TIME: 16.3 s — AB (ref 11.4–15.2)

## 2015-12-08 LAB — ANTI-DNA ANTIBODY, DOUBLE-STRANDED: ds DNA Ab: 17 IU/mL — ABNORMAL HIGH (ref 0–9)

## 2015-12-09 ENCOUNTER — Ambulatory Visit (HOSPITAL_COMMUNITY)
Admission: RE | Admit: 2015-12-09 | Discharge: 2015-12-09 | Disposition: A | Discharge status: Home / Self Care | Payer: BLUE CROSS/BLUE SHIELD | Source: Ambulatory Visit | Attending: Cardiovascular Disease | Admitting: Cardiovascular Disease

## 2015-12-09 ENCOUNTER — Ambulatory Visit (INDEPENDENT_AMBULATORY_CARE_PROVIDER_SITE_OTHER): Payer: BLUE CROSS/BLUE SHIELD | Admitting: Physician Assistant

## 2015-12-09 ENCOUNTER — Encounter: Payer: Self-pay | Admitting: Physician Assistant

## 2015-12-09 VITALS — BP 137/86 | HR 78 | Ht 68.0 in | Wt 187.0 lb

## 2015-12-09 DIAGNOSIS — I5033 Acute on chronic diastolic (congestive) heart failure: Secondary | ICD-10-CM | POA: Diagnosis not present

## 2015-12-09 DIAGNOSIS — I82402 Acute embolism and thrombosis of unspecified deep veins of left lower extremity: Secondary | ICD-10-CM

## 2015-12-09 DIAGNOSIS — I97641 Postprocedural seroma of a circulatory system organ or structure following cardiac bypass: Secondary | ICD-10-CM

## 2015-12-09 DIAGNOSIS — Z951 Presence of aortocoronary bypass graft: Secondary | ICD-10-CM

## 2015-12-09 DIAGNOSIS — Z952 Presence of prosthetic heart valve: Secondary | ICD-10-CM

## 2015-12-09 DIAGNOSIS — I63423 Cerebral infarction due to embolism of bilateral anterior cerebral arteries: Secondary | ICD-10-CM

## 2015-12-09 DIAGNOSIS — D6861 Antiphospholipid syndrome: Secondary | ICD-10-CM

## 2015-12-09 DIAGNOSIS — D696 Thrombocytopenia, unspecified: Secondary | ICD-10-CM

## 2015-12-09 DIAGNOSIS — M3211 Endocarditis in systemic lupus erythematosus: Secondary | ICD-10-CM

## 2015-12-09 MED ORDER — FUROSEMIDE 20 MG PO TABS: ORAL_TABLET | ORAL | 3 refills | Status: DC

## 2015-12-09 NOTE — Progress Notes (Signed)
Cardiology Office Note    Date:  12/09/2015   ID:  Brian Lloyd, DOB 02/28/1960, MRN [id number]  PCP:  Ethel Rana  Cardiologist:  Dr. Herbie Baltimore Hematology: Dr. Cyndie Chime  Chief Complaint  Patient presents with  . Hospitalization Follow-up    seen for Dr. Herbie Baltimore, post aortic valve mass resection    History of Present Illness:  MCDANIEL OHMS is a 55 y.o. male with PMH of DVT and recurrent CVA. Patient has been quite healthy prior to this year. He was seen by sports medicine after a left lower extremity tendon injury, he developed DVT afterward in July 2017. He also developed posterior circulation stroke in September 2017. MRI showed acute and subacute lacunar infarct in the left cerebellum. 2-D echo obtained on 9/80/2017 showed mild LVH, EF 60-65%, pseudo-normal grade 2 diastolic dysfunction, medium-sized 1.4 cm x 0.6 cm aortic valve mass suggestive of possible vegetation. Carotid Doppler showed homogenous plaque in the right carotid was mild intimal thickening on the left, less than 40% bilaterally. He underwent outpatient transesophageal echocardiogram on 10/22/2015 that showed large mobile mass along the ventricular surface of aortic valve measuring 2 x 1 cm, surface was very irregular, appears to be attached to coronary cusp near the base. Suspicious for febrile last [place], no evidence of thrombosis. I saw the patient on 10/21/2015 and the set him up for cardiac cath prior to CT surgery referral. As part of blood work workup, was noted he has quite a lot of platelet clumping. Only using situated tubes were weak able to estimate the platelet count around 65,000. He underwent cardiac catheterization on 10/29/2015 which showed no angiographic evidence of CAD. He was evaluated by Dr. 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 It was felt the mass that was seen on the TEE is actually a vegetation instead, in the setting of Libman-Sacks, this vegetation is no consistent of infection but more as an autoimmune response composed of combination of inflammatory cells in the components of clot. The only definitive means to establish a diagnosis would be with surgical resection. Since this is noninfectious in nature, there was high probability that his aortic valve could be preserved. Due to antiphospholipid antibody syndrome, his Xarelto was stopped and switched to Coumadin.  On 11/17/2015, he was contacted by hematology as his INRs was still subtherapeutic, he was advised to increase Coumadin level, however patient also complained of 2 episodes of recurrent neurological symptoms with change in the vision and transient memory loss. He was advised to go to the hospital. MRI of the brain obtained on 10/18 showed interval resolution of punctate left cerebellar infarct, subacute cortical infarct in the anterior right frontal lobe with associated has bent, acute punctate cortical nonhemorrhagic infarct in the anterior left frontal lobe. Fortunately during that admission, patient's father-in-law died and he became very depressed. He was discharged on Lovenox on 11/20/2015, his surgery moved up. Patient eventually underwent excision of aortic valve mass, aortic valve replacement on 11/24/2015 with [facility] Ease Pericardial Tissue Valve and CABG 2 with SVG to distal LAD and SVG to OM. Intraoperative finding include hemorrhagic vegetation densely adherent to ventricular surface of the aortic valve, numerous PMNs but no organisms seen on intraoperative Gram stain of vegetation. Moderate central aortic insufficiency after attempted valve repair eventually required valve replacement. 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 He had acute onset of  diffuse ST segment elevation with hypotension and severe dysfunction of the anterior and lateral wall consistent with myocardial ischemia involving left main coronary artery at the termination of procedure prior to transport. Normal LV function at the completion of the procedure after CABG 2. Pathology of the tissue removed shows granulation tissues and abundant fibrin. Postoperative hospital course, he did require some inotropic support but this was weaned without significant difficulty. He also had expected acute blood loss anemia, hemoglobin went down to 7.3 and he was transfused with a unit of packed red blood cell. Platelet remained in the 20,000 range. He is also on Coumadin 7.5 mg daily. He was postop fluid accumulation and responded quite well to diuretic. Per hematology recommendation, he will need lifelong aspirin and Coumadin.  He presents today for cardiology office visit, his surgical site has been quite stable since discharge. He denies any fever or chill. He denies any shortness of breath, however he did gain roughly 10 pounds since discharge based on his home scale. He also has noted at least 2+ pitting edema in bilateral lower extremity. At the vein harvesting site in the right medial thigh, he has noticed goose egg sized swelling since the surgery. He denies any significant pain or redness around the area. He is planning to follow-up with hematology regarding his Coumadin level. He understand that his INR has been subtherapeutic, his Coumadin was recently increased to 10 mg daily. Unfortunately, despite his INR was 1.77 on 12/04/2015, repeat lab 3 days later shows his INR was 1.3. He is still on the Lovenox and Coumadin bridge. He denies any other issues at this time. We did have our office vascular tach to ultrasound the swelling in his right upper thigh, as expected it is a sizable seroma likely as result of surgery. It is greater than 3 mm in diameter. I have advised him to discuss with CT  surgery service on follow-up. As for his lower extremity swelling, we have started him on Lasix 20 mg daily for 5 days before transitioned to Lasix 20 mg daily PRN.     Past Medical History:  Diagnosis Date  . Acute cerebral infarction (HCC) 11/18/2015   Bifrontal embolic infarcts MRI/MRA 11/18/15; known aortic valve vegetation on lovenox/coumadin/ASA  . Antiphospholipid antibody syndrome (HCC) 11/06/2015   11/04/15 significant elevation of IgG anticardiolipin & beta-2-GP-1 antibodies  . 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 Aortic valve mass   . 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 DVT (deep venous thrombosis) (HCC)    left leg, on Xarelto  . 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 S/P aortic valve replacement with bioprosthetic valve 11/24/2015   25 [facility] bovine pericardial tissue valve  . Thrombocytopenia (HCC)     Past Surgical History:  Procedure Laterality Date  . AORTIC VALVE REPLACEMENT N/A 11/24/2015   Procedure: AORTIC VALVE REPLACEMENT;  Surgeon: Purcell Nailslarence H Owen, MD;  [facility];  Service: Open Heart Surgery;  Laterality: N/A;  . CARDIAC CATHETERIZATION N/A 10/29/2015   Procedure: Coronary/Graft Angiography;  Surgeon: Kathleene Hazelhristopher D McAlhany, MD;  [facility];  Service: Cardiovascular;  Laterality: N/A;  . CORONARY ARTERY BYPASS GRAFT  N/A 11/24/2015   Procedure: CORONARY ARTERY BYPASS GRAFTING (CABG)x 2 WITH ENDOSCOPIC HARVESTING OF RIGHT SAPHENOUS VEIN -SVG to LAD -SVG to OM1;  Surgeon: Purcell Nailslarence H Owen, MD;  [facility];  Service: Open Heart Surgery;  Laterality: N/A;  . EXCISION OF ATRIAL MYXOMA N/A 11/24/2015   Procedure: EXCISION OF AORTIC VALVE MASS ;  Surgeon: Purcell Nailslarence H Owen, MD;  Location: [facility] OR;   Service: Open Heart Surgery;  Laterality: N/A;  . HERNIA REPAIR Bilateral   . MOUTH SURGERY     root canal  . TEE WITHOUT CARDIOVERSION N/A 10/22/2015   Procedure: TRANSESOPHAGEAL ECHOCARDIOGRAM (TEE);  Surgeon: Pricilla RifflePaula V Ross, MD;  [facility];  Service: Cardiovascular;  Laterality: N/A;  . TEE WITHOUT CARDIOVERSION N/A 11/24/2015   Procedure: TRANSESOPHAGEAL ECHOCARDIOGRAM (TEE);  Surgeon: Purcell Nailslarence H Owen, MD;  [facility];  Service: Open Heart Surgery;  Laterality: N/A;  . TRANSTHORACIC ECHOCARDIOGRAM  10/09/2015   Mild LVH. EF 60-65%. Pseudo-normal grade 2 diastolic dysfunction. 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 TEE recommended.    Current Medications: Outpatient Medications Prior to Visit  Medication Sig Dispense Refill  . acetaminophen (TYLENOL) 500 MG tablet Take 1,000 mg by mouth every 6 (six) hours as needed for mild pain or headache.    Marland Kitchen. aspirin EC 81 MG tablet Take 1 tablet (81 mg total) by mouth daily. Patient states he has taken aspirin in the past without getting a reaction    . enoxaparin (LOVENOX) 150 MG/ML injection Inject 0.59 mLs (90 mg total) into the skin 2 (two) times daily. 90 mL 1  . folic acid-pyridoxine-cyancobalamin (FOLTX) 2.5-25-2 MG TABS tablet Take 1 tablet by mouth daily. For one month then stop. 30 each 0  . iron polysaccharides (NIFEREX) 150 MG capsule Take 1 capsule (150 mg total) by mouth daily. For one month then stop. 30 capsule 0  . metoprolol tartrate (LOPRESSOR) 25 MG tablet Take 0.5 tablets (12.5 mg total) by mouth 2 (two) times daily. 30 tablet 1  . oxyCODONE (OXY IR/ROXICODONE) 5 MG immediate release tablet Take 5 mg by mouth every 4-6 hours PRN severe pain 28 tablet 0  . psyllium (METAMUCIL) 58.6 % packet Take 1 packet by mouth daily.    . Tetrahydrozoline HCl (VISINE OP) Apply 1 drop to eye daily as needed (dry eyes).    . warfarin (COUMADIN) 7.5 MG tablet Take 1 tablet (7.5 mg total)  by mouth one time only at 6 PM. Or as directed. 30 tablet 1   No facility-administered medications prior to visit.      Allergies:   Naproxen   Social History   Social History  . Marital status: Married    Spouse name: N/A  . Number of children: N/A  . Years of education: N/A   Social History Main Topics  . Smoking status: Never Smoker  . Smokeless tobacco: Never Used  . Alcohol use No     Comment: rarely  . Drug use: No  . Sexual activity: Yes   Other Topics Concern  . None   Social History Narrative  . None     Family History:  The patient's family history includes Diabetes in his paternal grandfather; Heart disease in his father, maternal grandfather, and paternal grandfather; Hypertension in his father and maternal grandfather.   ROS:   Please see the history of present illness.    ROS All other systems reviewed and are negative.   PHYSICAL  EXAM:   VS:  BP 137/86 (BP Location: Right Arm, Patient Position: Sitting, Cuff Size: Normal)   Pulse 78   Ht 5\' 8"  (1.727 m)   Wt 187 lb (84.8 kg)   BMI 28.43 kg/m    GEN: Well nourished, well developed, in no acute distress  HEENT: normal  Neck: no JVD, carotid bruits, or masses Cardiac: RRR; no murmurs, rubs, or gallops. 2+ pitting edema in bilateral lower extremity.  Respiratory:  clear to auscultation bilaterally, normal work of breathing GI: soft, nontender, nondistended, + BS MS: no deformity or atrophy  Skin: warm and dry, no rash Chest sternotomy site is stable, right upper medial thigh vein harvesting site is stable, however does have a swelling near the lower edge. Neuro:  Alert and Oriented x 3, Strength and sensation are intact Psych: euthymic mood, full affect  Wt Readings from Last 3 Encounters:  12/09/15 187 lb (84.8 kg)  12/02/15 195 lb (88.5 kg)  11/23/15 189 lb 1.6 oz (85.8 kg)      Studies/Labs Reviewed:   EKG:  EKG is not ordered today.   Recent Labs: 11/25/2015: Magnesium 2.2 12/04/2015:  ALT 79; BUN 14; Creatinine, Ser 1.38; Potassium 4.3; Sodium 135 12/07/2015: Hemoglobin 9.1; Platelets PLATELET CLUMPS NOTED ON SMEAR, COUNT APPEARS DECREASED   Lipid Panel No results found for: CHOL, TRIG, HDL, CHOLHDL, VLDL, LDLCALC, LDLDIRECT  Additional studies/ records that were reviewed today include:   Cath 10/29/2015 Conclusion   1. No angiographic evidence of CAD  Recommendations: No further ischemic workup     AV mass excision, AVR with bioprosthetic valve and CABG x 2 11/24/2015 by Dr. Cornelius Moras Procedure:        Excision of Aortic Valve Mass   Aortic Valve Replacement             [facility] Magna Ease Pericardial Tissue Valve (size 25 mm, model # 3300TFX, serial # [id number])   Coronary Artery Bypass Grafting x 2              Saphenous Vein Graft to Distal Left Anterior Descending Coronary Artery             Saphenous Vein Graft to Obtuse Marginal Branch of Left Circumflex Coronary Artery             Endoscopic Vein Harvest from Right Thigh  Operative Findings:  Hemorrhagic vegetation densely adherent to ventricular surface of aortic valve  Numerous PMN's but no organisms seen on intraoperative Gram stain of vegetation  Moderate (2+/3+) central aortic insufficiency after attempted valve repair requiring valve replacement  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 Heart valve leaflets, Aortic - CALCIFICATION AND FIBROSIS. 2. Heart valve leaflets, aortic mass and vegetation - FIBROSIS WITH GRANULATION TISSUE AND ABUNDANT FIBRIN.   ASSESSMENT:    1. Acute on chronic  diastolic heart failure (HCC)   2. Postoperative seroma involving circulatory system after cardiac bypass   3. H/O aortic valve replacement   4. S/P CABG x 2   5. Antiphospholipid antibody with hypercoagulable state (HCC)   6. Thrombocytopenia (HCC)   7. Libman-Sacks endocarditis (HCC)   8. Cerebrovascular accident (CVA) due  to bilateral embolism of anterior cerebral arteries (HCC)   9. Deep vein thrombosis (DVT) of left lower extremity, unspecified chronicity, unspecified vein (HCC)      PLAN:  In order of problems listed above:  1. Acute on chronic diastolic heart failure: He has at least 2+ pitting edema on physical exam. 1/6 systolic murmur at apex. Will start Lasix 20 mg daily for 5 days then transitioned to as needed. Consider obtaining repeat echocardiogram on follow-up.   2. Seroma of RLE: > 3 cm seroma found at the vein harvesting site on the right knee thigh. Confirmed on venous ultrasound obtained in the office today. He does not have any significant pain in the area. We will defer to CT surgery. No current sign of infection of the seroma.  3. Aortic mass s/p AVR and CABG x2 SVG to LAD and SVG to OM: Sternotomy site appears to be well-healed  4. Antiphospholipid antibody syndrome: followed by Dr. Cyndie Chime of hematology. Unfortunately, his INR has been subtherapeutic. He will continue to follow-up with hematology for further management.  5. Recurrent CVA: Had 2 CVAs recently, due to aortic valve mass which was felt to be Libman-Sack endocarditis that is not the result of infection but likely autoimmune accumulation of fibrin and clot that attributed to the recurrent CVA. He is currently on aspirin and Coumadin which will likely be continued lifelong.  6. Thrombocytopenia: Prior to surgery, he had platelet around 65,000, after aortic valve replacement, he had persistent thrombocytopenia with platelet around 20,000. No significant sign of bleeding on aspirin and Coumadin. Postop  anemia is improving.  7. DVT: On Coumadin. Previously on Xarelto, however this was switched to Coumadin after patient was diagnosed with antiphospholipid antibody syndrome which likely attributed to his recent recurrent CVA.    Medication Adjustments/Labs and Tests Ordered: Current medicines are reviewed at length with the patient today.  Concerns regarding medicines are outlined above.  Medication changes, Labs and Tests ordered today are listed in the Patient Instructions below. Patient Instructions  Medication Instructions:  START Lasix 20mg  Take 1 tablet by mouthy once a day for 5 days then take 1 tablet a day as needed   Labwork: None   Testing/Procedures: Vascular Ultrasound today  Follow-Up: Your physician recommends that you schedule a follow-up appointment in: 1 MONTH WITH DR HARDING.  Any Other Special Instructions Will Be Listed Below (If Applicable).     If you need a refill on your cardiac medications before your next appointment, please call your pharmacy.     Ramond Dial, [place]  12/09/2015 8:34 PM    [facility] [address] [place], [place], [place]  [address] Phone: [phone]; Fax: [phone]

## 2015-12-09 NOTE — Patient Instructions (Addendum)
Medication Instructions:  START Lasix 20mg  Take 1 tablet by mouthy once a day for 5 days then take 1 tablet a day as needed   Labwork: None   Testing/Procedures: Vascular Ultrasound today  Follow-Up: Your physician recommends that you schedule a follow-up appointment in: 1 MONTH WITH DR HARDING.  Any Other Special Instructions Will Be Listed Below (If Applicable).     If you need a refill on your cardiac medications before your next appointment, please call your pharmacy.

## 2015-12-10 ENCOUNTER — Ambulatory Visit (INDEPENDENT_AMBULATORY_CARE_PROVIDER_SITE_OTHER): Payer: BLUE CROSS/BLUE SHIELD | Admitting: Pharmacist

## 2015-12-10 ENCOUNTER — Telehealth: Payer: Self-pay | Admitting: Cardiology

## 2015-12-10 DIAGNOSIS — D6861 Antiphospholipid syndrome: Secondary | ICD-10-CM | POA: Diagnosis not present

## 2015-12-10 DIAGNOSIS — Z7901 Long term (current) use of anticoagulants: Secondary | ICD-10-CM | POA: Diagnosis not present

## 2015-12-10 DIAGNOSIS — I825Z2 Chronic embolism and thrombosis of unspecified deep veins of left distal lower extremity: Secondary | ICD-10-CM | POA: Diagnosis not present

## 2015-12-10 LAB — POCT INR: INR: 1.9

## 2015-12-10 NOTE — Telephone Encounter (Signed)
Left message on home and cell phone voicemail for patient to call and make one month follow up appt with Dr. Herbie BaltimoreHarding.

## 2015-12-11 ENCOUNTER — Ambulatory Visit (INDEPENDENT_AMBULATORY_CARE_PROVIDER_SITE_OTHER): Payer: BLUE CROSS/BLUE SHIELD | Admitting: Pharmacist

## 2015-12-11 DIAGNOSIS — Z7901 Long term (current) use of anticoagulants: Secondary | ICD-10-CM

## 2015-12-11 DIAGNOSIS — D6861 Antiphospholipid syndrome: Secondary | ICD-10-CM

## 2015-12-11 DIAGNOSIS — I825Z2 Chronic embolism and thrombosis of unspecified deep veins of left distal lower extremity: Secondary | ICD-10-CM | POA: Diagnosis not present

## 2015-12-11 LAB — POCT INR: INR: 2

## 2015-12-11 NOTE — Patient Instructions (Signed)
Patient educated about medication as defined in this encounter and verbalized understanding by repeating back instructions provided.   

## 2015-12-11 NOTE — Addendum Note (Signed)
Addended by: Mliss FritzKIM, Lateesha Bezold J on: 12/11/2015 10:25 AM   Modules accepted: Orders

## 2015-12-11 NOTE — Progress Notes (Signed)
Anticoagulation Management Brian Lloyd is a 55 y.o. male who reports to the clinic for monitoring of warfarin treatment.    Indication: DVT, antiphospholipid antibody syndrome  Duration: indefinite  Anticoagulation Clinic Visit History: Patient does not report signs/symptoms of bleeding or thromboembolism  Anticoagulation Episode Summary    Current INR goal:     TTR:   -  Next INR check:   12/14/2015  INR from last check:   2.0 (12/11/2015)  Weekly max dose:     Target end date:     INR check location:     Preferred lab:     Send INR reminders to:        Comments:          ASSESSMENT Recent Results: The most recent result is correlated with 62.5 mg per week: Lab Results  Component Value Date   INR 2.0 12/11/2015   INR 1.9 12/10/2015   INR 1.30 12/07/2015   Anticoagulation Dosing: INR as of 12/11/2015 and Previous Dosing Information    INR Dt [facility] Goal [facility] Tot Sun Mon Tue Wed Thu Fri Sat   12/11/2015 2.0 - 62.5 mg 7.5 mg 10 mg 7.5 mg 10 mg 10 mg 10 mg 7.5 mg    Anticoagulation Dose Instructions as of 12/11/2015      Total Sun Mon Tue Wed Thu Fri Sat   New Dose 62.5 mg 7.5 mg 10 mg 7.5 mg 10 mg 10 mg 7.5 mg 10 mg     (5 mg x 1.5)  (5 mg x 2)  (5 mg x 1.5)  (5 mg x 2)  (5 mg x 2)  (5 mg x 1.5)  (5 mg x 2)                           INR today: Therapeutic  PLAN Weekly dose was unchanged  Patient advised to contact clinic or seek medical attention if signs/symptoms of bleeding or thromboembolism occur.  Patient verbalized understanding by repeating back information and was advised to contact me if further medication-related questions arise. Patient was also provided an information handout.  Follow-up 12/14/15  Brian Lloyd J  15 minutes spent face-to-face with the patient during the encounter. 50% of time spent on education. 50% of time was spent on assessment and plan.

## 2015-12-11 NOTE — Progress Notes (Signed)
Anticoagulation Management Brian Lloyd is a 55 y.o. male who reports to the clinic for monitoring of warfarin treatment.    Indication: DVT and antiphospholipid antibody syndrome  Duration: indefinite  Anticoagulation Clinic Visit History: Patient does not report signs/symptoms of bleeding or thromboembolism   Anticoagulation Episode Summary    Current INR goal:     TTR:   -  Next INR check:     INR from last check:     Most recent INR:    1.9 (12/10/2015)  Weekly max dose:     Target end date:     INR check location:     Preferred lab:     Send INR reminders to:        Comments:          ASSESSMENT Recent Results: The most recent result is correlated with 60 mg per week: Lab Results  Component Value Date   INR 1.9 12/10/2015   INR 1.30 12/07/2015   INR 1.77 12/04/2015     INR today: Subtherapeutic, close to goal  PLAN Weekly dose was increased by 4% to 62.5 mg per week, 1 more day of enoxaparin therapy  Patient advised to contact clinic or seek medical attention if signs/symptoms of bleeding or thromboembolism occur.  Patient verbalized understanding by repeating back information and was advised to contact me if further medication-related questions arise. Patient was also provided an information handout.  Follow-up 1 day  Brian Lloyd J  15 minutes spent face-to-face with the patient during the encounter. 50% of time spent on education. 50% of time was spent on assessment and plan.

## 2015-12-13 NOTE — Progress Notes (Signed)
Reviewed Thanks DrG 

## 2015-12-13 NOTE — Progress Notes (Signed)
Reviewed Thanks Drg 

## 2015-12-14 ENCOUNTER — Ambulatory Visit (INDEPENDENT_AMBULATORY_CARE_PROVIDER_SITE_OTHER): Payer: BLUE CROSS/BLUE SHIELD | Admitting: Oncology

## 2015-12-14 ENCOUNTER — Encounter: Payer: Self-pay | Admitting: Oncology

## 2015-12-14 ENCOUNTER — Ambulatory Visit (INDEPENDENT_AMBULATORY_CARE_PROVIDER_SITE_OTHER): Payer: BLUE CROSS/BLUE SHIELD | Admitting: Pharmacist

## 2015-12-14 VITALS — BP 122/68 | HR 78 | Temp 97.8°F | Ht 68.0 in | Wt 180.7 lb

## 2015-12-14 DIAGNOSIS — Z7982 Long term (current) use of aspirin: Secondary | ICD-10-CM

## 2015-12-14 DIAGNOSIS — M3211 Endocarditis in systemic lupus erythematosus: Secondary | ICD-10-CM | POA: Diagnosis not present

## 2015-12-14 DIAGNOSIS — I824Z2 Acute embolism and thrombosis of unspecified deep veins of left distal lower extremity: Secondary | ICD-10-CM

## 2015-12-14 DIAGNOSIS — Z8673 Personal history of transient ischemic attack (TIA), and cerebral infarction without residual deficits: Secondary | ICD-10-CM | POA: Diagnosis not present

## 2015-12-14 DIAGNOSIS — Z7901 Long term (current) use of anticoagulants: Secondary | ICD-10-CM

## 2015-12-14 DIAGNOSIS — D6861 Antiphospholipid syndrome: Secondary | ICD-10-CM | POA: Diagnosis not present

## 2015-12-14 DIAGNOSIS — Z951 Presence of aortocoronary bypass graft: Secondary | ICD-10-CM

## 2015-12-14 DIAGNOSIS — D696 Thrombocytopenia, unspecified: Secondary | ICD-10-CM

## 2015-12-14 DIAGNOSIS — Z888 Allergy status to other drugs, medicaments and biological substances status: Secondary | ICD-10-CM

## 2015-12-14 DIAGNOSIS — Z952 Presence of prosthetic heart valve: Secondary | ICD-10-CM

## 2015-12-14 DIAGNOSIS — I82502 Chronic embolism and thrombosis of unspecified deep veins of left lower extremity: Secondary | ICD-10-CM

## 2015-12-14 DIAGNOSIS — I825Z2 Chronic embolism and thrombosis of unspecified deep veins of left distal lower extremity: Secondary | ICD-10-CM

## 2015-12-14 LAB — POCT INR: INR: 2

## 2015-12-14 NOTE — Progress Notes (Signed)
Reviewed Thanks DrG I would like to target INR 2.5-3.5

## 2015-12-14 NOTE — Progress Notes (Signed)
Anti-Coagulation Progress Note  Brian Lloyd is a 55 y.o. male who is currently on an anti-coagulation regimen.    RECENT RESULTS: Recent results are below, the most recent result is correlated with a dose of 62.5 mg. per week: Lab Results  Component Value Date   INR 2.0 12/14/2015   INR 2.0 12/11/2015   INR 1.9 12/10/2015    ANTI-COAG DOSE: Anticoagulation Dose Instructions as of 12/14/2015      Glynis SmilesSun Mon Tue Wed Thu Fri Sat   New Dose 7.5 mg 10 mg 7.5 mg 10 mg 10 mg 7.5 mg 10 mg       ANTICOAG SUMMARY: Anticoagulation Episode Summary    Current INR goal:     TTR:   -  Next INR check:   12/21/2015  INR from last check:     Weekly max dose:     Target end date:     INR check location:     Preferred lab:     Send INR reminders to:        Comments:           ANTICOAG TODAY: Anticoagulation Summary  As of 12/14/2015   INR goal:     TTR:     Today's INR:     Next INR check:   12/21/2015  Target end date:         Anticoagulation Episode Summary    INR check location:      Preferred lab:      Send INR reminders to:      Comments:        Recommended that patient follow up at the end of this week, 11/17, but him and his wife were unable to make an appointment until Monday, 11/20. They would eventually like to get INR testing done closer to their home.   PATIENT INSTRUCTIONS: Patient instructed to take medications as defined in the Anti-coagulation Track section of this encounter.  Patient instructed to take today's dose.  Patient verbalized understanding of these instructions.   FOLLOW-UP Return in about 7 days (around 12/21/2015) for Follow Up INR 11/20 at 9:30AM.  Carylon PerchesMaggie Nael Petrosyan, PharmD Acute Care Pharmacy Resident  Pager: [phone] 12/14/2015

## 2015-12-14 NOTE — Progress Notes (Signed)
Hematology and Oncology Follow Up Visit  Brian Lloyd [id number] 25-Jun-1960 55 y.o. 12/14/2015 6:12 PM   Principle Diagnosis: Encounter Diagnoses  Name Primary?  . 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 Antiphospholipid antibody syndrome ([place])   . Thrombocytopenia ([place])   . History of CVA (cerebrovascular accident) without residual deficits      Interim History:  First post hospital visit for this 55 year old man I initially saw on 11/04/2015 for further evaluation of thrombocytopenia. Please see previous notes for full details. At that point he had been evaluated for an acute left cerebellar infarct with no cardiac risk factors. 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 No fever. No leukocytosis. No recent dental work. No elevation of ESR. No evidence for a PFO or ASD on TEE. 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 I ordered an antiphospholipid antibodies and values returned highly elevated. ANA and rheumatoid factor negative but subsequently tested anti-DNA double-stranded antibodies were elevated. He had no signs or symptoms of a collagen vascular disorder. Additional finding was that he had what appeared to be an embolic stroke while on therapeutic anticoagulation with Xarelto started for a left lower extremity DVT which occurred 2 months prior. He was changed to aspirin and Lovenox. While arrangements were being made for surgery on his valve, he had recurrent neurologic signs and symptoms with diplopia and vertigo. Repeat MRI brain now showed 2 new infarcts in the frontal lobes bilaterally. Surgery was expedited and done on October 24. He had a bioprosthetic aortic valve placed. 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 He did not require any blood products to get through the surgery. He had an uneventful postoperative course. He was difficult to monitor his platelet count due to the in vitro platelet clumping. Machine count got down as low as about 24,000. 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 He was given some parenteral vitamin K to reverse Coumadin at time of initial hospital admission. This made it difficult to get him therapeutic again. He went home on Coumadin 7.5 mg daily and Lovenox 1 mg/kg subcutaneous twice daily. It took until November 10 to get him low therapeutic at INR of 2.0 and the Lovenox was discontinued.  He continues to recover rapidly from the surgery. He does admit to some transient changes in his vision but he feels that his mental function is now back to 100% before all these problems started. He has had some puffiness and a small seroma at the site of saphenous vein graft right leg. No swelling or pain of his left calf where he had the previous DVT.  He is accompanied by his wife today who has been his constant support throughout this travesty.   Medications: reviewed  Allergies:  Allergies  Allergen Reactions  . Naproxen Anaphylaxis    Review of Systems: See interim history Remaining ROS negative:   Physical Exam: Blood pressure 122/68, pulse 78, temperature 97.8 F (36.6 C), temperature source Oral, height 5' 8"  (1.727 m), weight 180 lb 11.2  oz (82 kg), SpO2 100 %. Wt Readings from Last 3 Encounters:  12/14/15 180 lb 11.2 oz (82 kg)  12/09/15 187 lb (84.8 kg)  12/02/15 195 lb (88.5 kg)     General appearance: Well nourished Caucasian man HENNT: Pharynx no erythema, exudate, mass, or ulcer. No thyromegaly or  thyroid nodules Lymph nodes: No cervical, supraclavicular, or axillary lymphadenopathy Chest wall: Well-healed midline incision Lungs: Clear to auscultation, resonant to percussion throughout Heart: Regular rhythm, no murmur, no gallop, no rub, no click, no edema Abdomen: Soft, nontender, normal bowel sounds, no mass, no organomegaly Extremities: No edema, no calf tenderness. Calves measure 37 cm left and right. Musculoskeletal: no joint deformities GU:  Vascular: Carotid pulses 2+, no bruits,  Neurologic: Alert, oriented, PERRLA, optic discs sharp and vessels normal, no hemorrhage or exudate, cranial nerves grossly normal, motor strength 5 over 5, reflexes 1+ symmetric, upper body coordination normal, gait normal, Skin: No rash or ecchymosis  Lab Results: CBC W/Diff    Component Value Date/Time   WBC 6.2 12/07/2015 0953   RBC 3.21 (L) 12/07/2015 0953   HGB 9.1 (L) 12/07/2015 0953   HCT 28.3 (L) 12/07/2015 0953   PLT  12/07/2015 0953    PLATELET CLUMPS NOTED ON SMEAR, COUNT APPEARS DECREASED   MCV 88.2 12/07/2015 0953   MCH 28.3 12/07/2015 0953   MCHC 32.2 12/07/2015 0953   RDW 13.6 12/07/2015 0953   LYMPHSABS 0.8 12/07/2015 0953   MONOABS 0.3 12/07/2015 0953   EOSABS 0.1 12/07/2015 0953   BASOSABS 0.0 12/07/2015 0953     Chemistry      Component Value Date/Time   NA 135 12/04/2015 0959   K 4.3 12/04/2015 0959   CL 102 12/04/2015 0959   CO2 28 12/04/2015 0959   BUN 14 12/04/2015 0959   CREATININE 1.38 (H) 12/04/2015 0959   CREATININE 1.27 10/26/2015 1114      Component Value Date/Time   CALCIUM 8.7 (L) 12/04/2015 0959   ALKPHOS 79 12/04/2015 0959   AST 42 (H) 12/04/2015 0959   ALT 79 (H) 12/04/2015 0959   BILITOT 0.5 12/04/2015 0959     INR 2.0 current Coumadin dose 7.5 mg Sunday, Tuesday, and Friday. 10 mg on Monday, Wednesday, Thursday, and Saturday.   Impression:  #1. 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  #2. 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  #3. Provoked left lower extremity DVT antedating the diagnosis of #1.  Plan is long-term anticoagulation with low-dose aspirin and full dose warfarin target INR 2.5-3.5.   CC: Patient Care Team: Corine Shelter, PA-C as PCP - General (Physician Assistant) Rexene Alberts, MD as Consulting Physician (Cardiothoracic Surgery) Sid Falcon, MD as Consulting Physician (Internal Medicine) Annia Belt, MD as Consulting Physician (Oncology)   Annia Belt, MD 11/13/20176:12 PM

## 2015-12-14 NOTE — Patient Instructions (Signed)
Continue coumadin at current dose Continue baby aspirin Return visit 4 months Lab day of visit

## 2015-12-14 NOTE — Patient Instructions (Signed)
Patient instructed to take medications as defined in the Anti-coagulation Track section of this encounter.  Patient instructed to take today's dose.  Patient verbalized understanding of these instructions.    

## 2015-12-21 ENCOUNTER — Ambulatory Visit (INDEPENDENT_AMBULATORY_CARE_PROVIDER_SITE_OTHER): Payer: BLUE CROSS/BLUE SHIELD | Admitting: Pharmacist

## 2015-12-21 DIAGNOSIS — Z7901 Long term (current) use of anticoagulants: Secondary | ICD-10-CM

## 2015-12-21 DIAGNOSIS — I358 Other nonrheumatic aortic valve disorders: Secondary | ICD-10-CM

## 2015-12-21 DIAGNOSIS — Z8673 Personal history of transient ischemic attack (TIA), and cerebral infarction without residual deficits: Secondary | ICD-10-CM | POA: Diagnosis not present

## 2015-12-21 DIAGNOSIS — I825Z2 Chronic embolism and thrombosis of unspecified deep veins of left distal lower extremity: Secondary | ICD-10-CM | POA: Diagnosis not present

## 2015-12-21 LAB — POCT INR: INR: 2

## 2015-12-21 MED ORDER — WARFARIN SODIUM 10 MG PO TABS: ORAL_TABLET | ORAL | 2 refills | Status: DC

## 2015-12-21 MED ORDER — WARFARIN SODIUM 7.5 MG PO TABS: ORAL_TABLET | ORAL | 2 refills | Status: DC

## 2015-12-21 NOTE — Progress Notes (Signed)
Reviewed Thanks DrG 

## 2015-12-21 NOTE — Progress Notes (Signed)
Anticoagulation Management Brian Lloyd is a 55 y.o. male who reports to the clinic for monitoring of warfarin treatment.    Indication: DVT, CVA history, aortic valve disease Duration: indefinite  Anticoagulation Clinic Visit History: Patient does report TIA symptoms, mild, transient vertigo (lasting 30 seconds to 1 minute). Patient states it feels more like a blood pressure drop.  Anticoagulation Episode Summary    Current INR goal:     TTR:   -  Next INR check:   12/28/2015  INR from last check:   2.0 (12/21/2015)  Weekly max dose:     Target end date:     INR check location:     Preferred lab:     Send INR reminders to:        Comments:          ASSESSMENT Recent Results: The most recent result is correlated with 65 mg per week: Lab Results  Component Value Date   INR 2.0 12/21/2015   INR 2.0 12/14/2015   INR 2.0 12/11/2015   Anticoagulation Dosing: INR as of 12/21/2015 and Previous Dosing Information    INR Dt INR Goal [facility] Tot Sun Mon Tue Wed Thu Fri Sat   12/21/2015 2.0 - 62.5 mg 7.5 mg 10 mg 7.5 mg 10 mg 10 mg 7.5 mg 10 mg    Anticoagulation Dose Instructions as of 12/21/2015      Total Sun Mon Tue Wed Thu Fri Sat   New Dose 65 mg 7.5 mg 10 mg 10 mg 10 mg 7.5 mg 10 mg 10 mg     (5 mg x 1.5)  (5 mg x 2)  (5 mg x 2)  (5 mg x 2)  (5 mg x 1.5)  (5 mg x 2)  (5 mg x 2)                           INR today: Therapeutic  PLAN Weekly dose was increased by 4%  Patient Instructions  Patient educated about medication as defined in this encounter and verbalized understanding by repeating back instructions provided.   Patient advised to contact clinic or seek medical attention if signs/symptoms of bleeding or thromboembolism occur.  Patient verbalized understanding by repeating back information and was advised to contact me if further medication-related questions arise. Patient was also provided an information handout.  Follow-up Return in about 1 week  (around 12/28/2015).  Pennelope Basque J  15 minutes spent face-to-face with the patient during the encounter. 50% of time spent on education. 50% of time was spent on assessment and plan.

## 2015-12-21 NOTE — Patient Instructions (Signed)
Patient educated about medication as defined in this encounter and verbalized understanding by repeating back instructions provided.   

## 2015-12-22 ENCOUNTER — Other Ambulatory Visit: Payer: Self-pay | Admitting: Thoracic Surgery (Cardiothoracic Vascular Surgery)

## 2015-12-22 DIAGNOSIS — Z951 Presence of aortocoronary bypass graft: Secondary | ICD-10-CM

## 2015-12-22 LAB — FUNGUS CULTURE WITH STAIN

## 2015-12-22 LAB — FUNGAL ORGANISM REFLEX

## 2015-12-22 LAB — FUNGUS CULTURE RESULT

## 2015-12-28 ENCOUNTER — Ambulatory Visit
Admission: RE | Admit: 2015-12-28 | Discharge: 2015-12-28 | Disposition: A | Discharge status: Home / Self Care | Payer: BLUE CROSS/BLUE SHIELD | Source: Ambulatory Visit | Attending: Thoracic Surgery (Cardiothoracic Vascular Surgery) | Admitting: Thoracic Surgery (Cardiothoracic Vascular Surgery)

## 2015-12-28 ENCOUNTER — Ambulatory Visit (INDEPENDENT_AMBULATORY_CARE_PROVIDER_SITE_OTHER): Payer: BLUE CROSS/BLUE SHIELD | Admitting: Pharmacist

## 2015-12-28 ENCOUNTER — Ambulatory Visit (INDEPENDENT_AMBULATORY_CARE_PROVIDER_SITE_OTHER): Payer: Self-pay | Admitting: Thoracic Surgery (Cardiothoracic Vascular Surgery)

## 2015-12-28 ENCOUNTER — Encounter: Payer: Self-pay | Admitting: Thoracic Surgery (Cardiothoracic Vascular Surgery)

## 2015-12-28 VITALS — BP 123/77 | HR 70 | Resp 20 | Ht 68.0 in | Wt 178.0 lb

## 2015-12-28 DIAGNOSIS — I2510 Atherosclerotic heart disease of native coronary artery without angina pectoris: Secondary | ICD-10-CM

## 2015-12-28 DIAGNOSIS — Z86718 Personal history of other venous thrombosis and embolism: Secondary | ICD-10-CM

## 2015-12-28 DIAGNOSIS — Z951 Presence of aortocoronary bypass graft: Secondary | ICD-10-CM

## 2015-12-28 DIAGNOSIS — I358 Other nonrheumatic aortic valve disorders: Secondary | ICD-10-CM

## 2015-12-28 DIAGNOSIS — Z952 Presence of prosthetic heart valve: Secondary | ICD-10-CM

## 2015-12-28 DIAGNOSIS — Z953 Presence of xenogenic heart valve: Secondary | ICD-10-CM

## 2015-12-28 DIAGNOSIS — Z7901 Long term (current) use of anticoagulants: Secondary | ICD-10-CM

## 2015-12-28 DIAGNOSIS — I359 Nonrheumatic aortic valve disorder, unspecified: Secondary | ICD-10-CM

## 2015-12-28 DIAGNOSIS — I330 Acute and subacute infective endocarditis: Secondary | ICD-10-CM

## 2015-12-28 LAB — POCT INR: INR: 2.9

## 2015-12-28 NOTE — Patient Instructions (Addendum)
Continue to avoid any heavy lifting or strenuous use of your arms or shoulders for at least a total of three months from the time of surgery.  After three months you may gradually increase how much you lift or otherwise use your arms or chest as tolerated, with limits based upon whether or not activities lead to the return of significant discomfort.  You may return to driving an automobile as long as you are no longer requiring oral narcotic pain relievers during the daytime.  It would be wise to start driving only short distances during the daylight and gradually increase from there as you feel comfortable.  You are encouraged to enroll and participate in the outpatient cardiac rehab program beginning as soon as practical.  Continue all previous medications without any changes at this time  Endocarditis is a potentially serious infection of heart valves or inside lining of the heart.  It occurs more commonly in patients with diseased heart valves (such as patient's with aortic or mitral valve disease) and in patients who have undergone heart valve repair or replacement.  Certain surgical and dental procedures may put you at risk, such as dental cleaning, other dental procedures, or any surgery involving the respiratory, urinary, gastrointestinal tract, gallbladder or prostate gland.   To minimize your chances for developing endocarditis, maintain good oral health and seek prompt medical attention for any infections involving the mouth, teeth, gums, skin or urinary tract.    Always notify your doctor or dentist about your underlying heart valve condition before having any invasive procedures. You will need to take antibiotics before certain procedures, including all routine dental cleanings or other dental procedures.  Your cardiologist or dentist should prescribe these antibiotics for you to be taken ahead of time.

## 2015-12-28 NOTE — Patient Instructions (Signed)
Patient instructed to take medications as defined in the Anti-coagulation Track section of this encounter.  Patient instructed to take today's dose.  Patient instructed to take 7.5mg  warfarin on Sundays and Thursdays. All other days--take 10mg  warfarin.  Patient verbalized understanding of these instructions.

## 2015-12-28 NOTE — Progress Notes (Signed)
Anti-Coagulation Progress Note  Brian Lloyd is a 55 y.o. male who is currently on an anti-coagulation regimen.    RECENT RESULTS: Recent results are below, the most recent result is correlated with a dose of 65 mg. per week: Lab Results  Component Value Date   INR 2.90 12/28/2015   INR 2.0 12/21/2015   INR 2.0 12/14/2015    ANTI-COAG DOSE: Anticoagulation Dose Instructions as of 12/28/2015      Glynis SmilesSun Mon Tue Wed Thu Fri Sat   New Dose 7.5 mg 10 mg 10 mg 10 mg 7.5 mg 10 mg 10 mg    Description   Take 7.5mg  warfarin on Sundays and Thursdays. On all other days-take 10mg  warfarin.       ANTICOAG SUMMARY: Anticoagulation Episode Summary    Current INR goal:     TTR:   -  Next INR check:   01/06/2016  INR from last check:   2.90 (12/28/2015)  Weekly max dose:     Target end date:     INR check location:     Preferred lab:     Send INR reminders to:        Comments:           ANTICOAG TODAY: Anticoagulation Summary  As of 12/28/2015   INR goal:     TTR:     Today's INR:   2.90  Next INR check:   01/06/2016  Target end date:         Anticoagulation Episode Summary    INR check location:      Preferred lab:      Send INR reminders to:      Comments:         PATIENT INSTRUCTIONS: Patient Instructions  Patient instructed to take medications as defined in the Anti-coagulation Track section of this encounter.  Patient instructed to take today's dose.  Patient instructed to take 7.5mg  warfarin on Sundays and Thursdays. All other days--take 10mg  warfarin.  Patient verbalized understanding of these instructions.       FOLLOW-UP Return in 9 days (on 01/06/2016) for Follow up INR at 1000h.  Hulen LusterJames Rowyn Spilde, III Pharm.D., CACP

## 2015-12-28 NOTE — Progress Notes (Signed)
[address]       [name] [id number]             [phone]     CARDIOTHORACIC SURGERY OFFICE NOTE  Referring Provider is Brian Lloyd, Brian Lloyd PCP is Brian RanaHEPLER,MARK, PA-C   HPI:  Patient is a 55 year old male with history of antiphospholipid antibody syndrome and Brian Lloyd who returns to the office for routine follow-up status post excision of aortic valve mass, aortic valve replacement using a bioprosthetic tissue valve, and coronary artery bypass grafting 2 on 11/24/2015.  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 Cultures were negative. Attempts to repair the valve were unsuccessful and the valve was replaced using a bovine bioprosthetic tissue valve. The patient's specific request. 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 The patient subsequently underwent coronary artery bypass grafting 2 using a reversed saphenous vein grafts to both the left anterior descending coronary artery and the obtuse marginal branch of left circumflex coronary artery.  The patient's postoperative recovery was uneventful and he was ultimately discharged home on the eighth postoperative day. He has been followed carefully since discharge by Dr. Cyndie Lloyd who has been monitoring the patient's anticoagulation using aspirin and warfarin.  He returns to our office for routine follow-up today. He reports that he is currently doing remarkably well. He has minimal residual pain in his chest and he has not been taking any sort of pain relievers. His activity level is quite good and he has been walking every day. His appetite is good. He has lost weight since  hospital discharge and he wants to get back into more regular exercise. He already started driving an automobile. He still has occasional mild transient visual disturbances that are unchanged with the symptoms he described prior to surgery. He otherwise feels remarkably well and he has no complaints.   Current Outpatient Prescriptions  Medication Sig Dispense Refill  . acetaminophen (TYLENOL) 500 MG tablet Take 1,000 mg by mouth every 6 (six) hours as needed for mild pain or headache.    Marland Kitchen. aspirin EC 81 MG tablet Take 1 tablet (81 mg total) by mouth daily. Patient states he has taken aspirin in the past without getting a reaction    . folic acid-pyridoxine-cyancobalamin (FOLTX) 2.5-25-2 MG TABS tablet Take 1 tablet by mouth daily. For one month then stop. 30 each 0  . furosemide (LASIX) 20 MG tablet TAKE 1 TABLET ONCE A DAY FOR 5 DAYS THEN AS NEEDED (Patient not taking: Reported on 12/28/2015) 30 tablet 3  . iron polysaccharides (NIFEREX) 150 MG capsule Take 1 capsule (150 mg total) by mouth daily. For one month then stop. 30 capsule 0  . metoprolol tartrate (LOPRESSOR) 25 MG tablet Take 0.5 tablets (12.5 mg total) by mouth 2 (two) times daily. 30 tablet 1  . oxyCODONE (OXY IR/ROXICODONE) 5 MG immediate release tablet Take 5 mg by mouth every 4-6 hours PRN severe pain 28 tablet 0  . psyllium (METAMUCIL) 58.6 % packet Take 1 packet by mouth daily.    . Tetrahydrozoline HCl (VISINE OP) Apply 1 drop to eye daily as needed (dry eyes).    . warfarin (COUMADIN) 10 MG tablet Take  1 tablet by mouth on Mon, Tue, Wed, Fri, Sat 25 tablet 2  . warfarin (COUMADIN) 7.5 MG tablet Take 1 tablet by mouth on Thursdays and Sundays 10 tablet 2   No current facility-administered medications for this visit.       Physical Exam:   BP 123/77   Pulse 70   Resp 20   Ht 5\' 8"  (1.727 m)   Wt 178 lb (80.7 kg)   SpO2 98% Comment: RA  BMI 27.06 kg/m   General:  Well-appearing  Chest:   Clear to  auscultation  CV:   Regular rate and rhythm  Incisions:  Healing nicely, sternum is stable  Abdomen:  Soft nontender  Extremities:  Warm and well-perfused, small firm soft tissue mass beneath the right thigh vein harvest incision is consistent with small resolving hematoma  Diagnostic Tests:  CHEST  2 VIEW  COMPARISON:  PA and lateral chest x-ray of November 30, 2015  FINDINGS: The lungs are adequately inflated. The interstitial markings have become less conspicuous. There remains mild increased density lateral to the left heart border in the periphery of the lower left hemi thorax compatible with scarring. The heart is top-normal in size. The pulmonary vascularity is normal. The sternal wires are intact. The prosthetic aortic valve ring is in reasonable position radiographically. The retrosternal soft tissues are less conspicuous today. There is multilevel degenerative disc disease of the thoracic spine.  IMPRESSION: Interval resolution of small bilateral pleural effusions and decreased pulmonary interstitial edema. No significant pulmonary vascular congestion observed today. Minimal residual atelectasis or scarring in the lower left lung laterally.   Electronically Signed   By: Brian  Lloyd M.D.   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 Sacks Lloyd.  Plan:  I have encouraged patient to continue to gradually increase his physical activity as tolerated with his primary limitation remaining that he refrain from heavy lifting or strenuous use of his arms or shoulders for at least another 2 months. We have not recommended any changes to his current medications but he probably could stop taking iron and folic acid supplements at  any time.  The patient may resume driving an automobile. I have encouraged the patient to consider enrolling in participating in the outpatient cardiac rehabilitation program.  The patient has been reminded regarding the importance of dental hygiene and the lifelong need for antibiotic prophylaxis for all dental cleanings and other related invasive procedures.  The patient will return to our office for routine follow-up next October, approximately 1 year following his surgery. He will call and return sooner only should specific problems or questions arise.    Salvatore Decentlarence H. Cornelius Moraswen, Lloyd 12/28/2015 1:57 PM

## 2015-12-28 NOTE — Progress Notes (Signed)
INTERNAL MEDICINE TEACHING ATTENDING ADDENDUM - Gust RungErik C Tamar Miano, DO Duration- indefinate, Indication- bioprostetic AVR, Hx DVT, INR-  Lab Results  Component Value Date   INR 2.90 12/28/2015  . Agree with pharmacy recommendations as outlined in their note.

## 2015-12-29 ENCOUNTER — Telehealth (HOSPITAL_COMMUNITY): Payer: Self-pay | Admitting: Cardiac Rehabilitation

## 2015-12-29 NOTE — Telephone Encounter (Signed)
pc to pt to discuss enrolling in cardiac rehab. Pt declined.  Pt is exercising on his own.   

## 2015-12-29 NOTE — Progress Notes (Signed)
Reviewed Thanks DrG 

## 2016-01-06 ENCOUNTER — Ambulatory Visit (INDEPENDENT_AMBULATORY_CARE_PROVIDER_SITE_OTHER): Payer: BLUE CROSS/BLUE SHIELD | Admitting: Pharmacist

## 2016-01-06 DIAGNOSIS — Z7901 Long term (current) use of anticoagulants: Secondary | ICD-10-CM

## 2016-01-06 DIAGNOSIS — Z953 Presence of xenogenic heart valve: Secondary | ICD-10-CM | POA: Diagnosis not present

## 2016-01-06 LAB — POCT INR: INR: 3.1

## 2016-01-06 NOTE — Patient Instructions (Signed)
Patient instructed to take medications as defined in the Anti-coagulation Track section of this encounter.  Patient instructed to take today's dose.  Patient instructed to take 7.5mg  warfarin tablet on Mondays, Wednesdays and Fridays; on all other days--take 10mg  warfarin. Patient instructed to contact his dentist in advance of his upcoming dental cleaning for provision of prophylactic antibiotics for what is considered within the guidelines as a high risk dental procedure requiring antibiotic prophylaxis.  Patient verbalized understanding of these instructions.

## 2016-01-06 NOTE — Progress Notes (Signed)
Reviewed & discussed with you Thanks DrG 

## 2016-01-06 NOTE — Progress Notes (Signed)
Anti-Coagulation Progress Note  Brian Lloyd is a 55 y.o. male who is currently on an anti-coagulation regimen.    RECENT RESULTS: Recent results are below, the most recent result is correlated with a dose of 65 mg. per week: Lab Results  Component Value Date   INR 3.10 01/06/2016   INR 2.90 12/28/2015   INR 2.0 12/21/2015    ANTI-COAG DOSE: Anticoagulation Dose Instructions as of 01/06/2016      Glynis SmilesSun Mon Tue Wed Thu Fri Sat   New Dose 10 mg 7.5 mg 10 mg 7.5 mg 10 mg 7.5 mg 10 mg    Description   Take 7.5mg  warfarin on Mondays, Wednesdays, Fridays; all other days---take 10mg  warfarin.        ANTICOAG SUMMARY: Anticoagulation Episode Summary    Current INR goal:     TTR:   -  Next INR check:   01/18/2016  INR from last check:   3.10 (01/06/2016)  Weekly max dose:     Target end date:     INR check location:     Preferred lab:     Send INR reminders to:        Comments:           ANTICOAG TODAY: Anticoagulation Summary  As of 01/06/2016   INR goal:     TTR:     Today's INR:   3.10  Next INR check:   01/18/2016  Target end date:         Anticoagulation Episode Summary    INR check location:      Preferred lab:      Send INR reminders to:      Comments:         PATIENT INSTRUCTIONS: Patient instructed to take medications as defined in the Anti-coagulation Track section of this encounter.  Patient instructed to take today's dose.  Patient instructed to take 7.5mg  warfarin tablet on Mondays, Wednesdays and Fridays; on all other days--take 10mg  warfarin. Patient instructed to contact his dentist in advance of his upcoming dental cleaning for provision of prophylactic antibiotics for what is considered within the guidelines as a high risk dental procedure requiring antibiotic prophylaxis.  Patient verbalized understanding of these instructions.      FOLLOW-UP Return in 12 days (on 01/18/2016) for Follow up INR at 1000h.  Hulen LusterJames Onesimo Lingard, III Pharm.D.,  CACP

## 2016-01-08 LAB — ACID FAST CULTURE WITH REFLEXED SENSITIVITIES: ACID FAST CULTURE - AFSCU3: NEGATIVE

## 2016-01-18 ENCOUNTER — Ambulatory Visit (INDEPENDENT_AMBULATORY_CARE_PROVIDER_SITE_OTHER): Payer: BLUE CROSS/BLUE SHIELD | Admitting: Pharmacist

## 2016-01-18 DIAGNOSIS — Z7901 Long term (current) use of anticoagulants: Secondary | ICD-10-CM

## 2016-01-18 DIAGNOSIS — Z952 Presence of prosthetic heart valve: Secondary | ICD-10-CM | POA: Diagnosis not present

## 2016-01-18 DIAGNOSIS — Z8673 Personal history of transient ischemic attack (TIA), and cerebral infarction without residual deficits: Secondary | ICD-10-CM | POA: Diagnosis not present

## 2016-01-18 LAB — POCT INR: INR: 4

## 2016-01-18 MED ORDER — WARFARIN SODIUM 5 MG PO TABS: ORAL_TABLET | ORAL | 2 refills | Status: DC

## 2016-01-18 NOTE — Progress Notes (Signed)
Reviewed Thanks DrG 

## 2016-01-18 NOTE — Patient Instructions (Signed)
Patient instructed to take medications as defined in the Anti-coagulation Track section of this encounter.  Patient instructed to take today's dose.  Patient instructed to take 2 x 5mg  warfarin tablets on Sundays and Thursdays; all other days--take 1&1/2 tablets. Patient instructed to discontinue his 7.5mg  and 10mg  strength warfarin tablets and use instead--the 5mg  strength warfarin tablets sent electronically to his pharmacy.  Patient verbalized understanding of these instructions.

## 2016-01-18 NOTE — Progress Notes (Signed)
Anti-Coagulation Progress Note  [place] [name] is a 55 y.o. male who is currently on an anti-coagulation regimen.    RECENT RESULTS: Recent results are below, the most recent result is correlated with a dose of 62.5 mg. per week: Lab Results  Component Value Date   INR 4.00 01/18/2016   INR 3.10 01/06/2016   INR 2.90 12/28/2015    ANTI-COAG DOSE: Anticoagulation Dose Instructions as of 01/18/2016      Glynis SmilesSun Mon Tue Wed Thu Fri Sat   New Dose 10 mg 7.5 mg 7.5 mg 7.5 mg 10 mg 7.5 mg 7.5 mg    Description   Take 10mg  warfarin on Sundays and Thursdays; all other days--take 7.5mg .      ANTICOAG SUMMARY: Anticoagulation Episode Summary    Current INR goal:     TTR:   -  Next INR check:   02/08/2016  INR from last check:   4.00 (01/18/2016)  Weekly max dose:     Target end date:     INR check location:     Preferred lab:     Send INR reminders to:        Comments:           ANTICOAG TODAY: Anticoagulation Summary  As of 01/18/2016   INR goal:     TTR:     Today's INR:   4.00  Next INR check:   02/08/2016  Target end date:         Anticoagulation Episode Summary    INR check location:      Preferred lab:      Send INR reminders to:      Comments:         Patient instructed to take medications as defined in the Anti-coagulation Track section of this encounter.  Patient instructed to take today's dose.  Patient instructed to take 2 x 5mg  warfarin tablets on Sundays and Thursdays; all other days--take 1&1/2 tablets. Patient instructed to discontinue his 7.5mg  and 10mg  strength warfarin tablets and use instead--the 5mg  strength warfarin tablets sent electronically to his pharmacy.  Patient verbalized understanding of these instructions.    PATIENT INSTRUCTIONS:   FOLLOW-UP Return in 3 weeks (on 02/08/2016) for Follow up INR at 1030h.  Hulen LusterJames Jaree Dwight, III Pharm.D., CACP

## 2016-02-08 ENCOUNTER — Ambulatory Visit (INDEPENDENT_AMBULATORY_CARE_PROVIDER_SITE_OTHER): Payer: BLUE CROSS/BLUE SHIELD | Admitting: Pharmacist

## 2016-02-08 DIAGNOSIS — Z7901 Long term (current) use of anticoagulants: Secondary | ICD-10-CM | POA: Diagnosis not present

## 2016-02-08 DIAGNOSIS — D6861 Antiphospholipid syndrome: Secondary | ICD-10-CM

## 2016-02-08 LAB — POCT INR: INR: 2.4

## 2016-02-08 NOTE — Progress Notes (Signed)
Anti-Coagulation Progress Note  Brian Lloyd is a 56 y.o. male who is currently on an anti-coagulation regimen.    RECENT RESULTS: Recent results are below, the most recent result is correlated with a dose of 50 mg. per week: Lab Results  Component Value Date   INR 2.40 02/08/2016   INR 4.00 01/18/2016   INR 3.10 01/06/2016    ANTI-COAG DOSE: Anticoagulation Dose Instructions as of 02/08/2016      Glynis SmilesSun Mon Tue Wed Thu Fri Sat   New Dose 10 mg 7.5 mg 7.5 mg 7.5 mg 7.5 mg 7.5 mg 7.5 mg    Description   Take 10mg  warfarin on Sundays--all other days--take 7.5mg .      ANTICOAG SUMMARY: Anticoagulation Episode Summary    Current INR goal:     TTR:   -  Next INR check:   02/29/2016  INR from last check:   2.40 (02/08/2016)  Weekly max dose:     Target end date:     INR check location:     Preferred lab:     Send INR reminders to:        Comments:           ANTICOAG TODAY: Anticoagulation Summary  As of 02/08/2016   INR goal:     TTR:     Today's INR:   2.40  Next INR check:   02/29/2016  Target end date:         Anticoagulation Episode Summary    INR check location:      Preferred lab:      Send INR reminders to:      Comments:         PATIENT INSTRUCTIONS: Patient instructed to take medications as defined in the Anti-coagulation Track section of this encounter.  Patient instructed to taketoday's dose.  Patient instructed to take 10mg  on Sundays and 7.5mg  warfarin on all other days. Patient verbalized understanding of these instructions.       FOLLOW-UP Return in 3 weeks (on 02/29/2016) for Follow up INR at 1045h.  Hulen LusterJames Groce, III Pharm.D., CACP

## 2016-02-08 NOTE — Patient Instructions (Signed)
Patient instructed to take medications as defined in the Anti-coagulation Track section of this encounter.  Patient instructed to taketoday's dose.  Patient instructed to take 10mg  on Sundays and 7.5mg  warfarin on all other days. Patient verbalized understanding of these instructions.

## 2016-02-09 NOTE — Progress Notes (Signed)
INTERNAL MEDICINE TEACHING ATTENDING ADDENDUM - Gust RungErik C Tyrann Donaho, DO Duration- indefinate, Indication- Antiphospholipid ab, DVT, INR-  Lab Results  Component Value Date   INR 2.40 02/08/2016  . Agree with pharmacy recommendations as outlined in their note.

## 2016-02-26 DIAGNOSIS — M47812 Spondylosis without myelopathy or radiculopathy, cervical region: Secondary | ICD-10-CM | POA: Diagnosis not present

## 2016-02-29 ENCOUNTER — Ambulatory Visit (INDEPENDENT_AMBULATORY_CARE_PROVIDER_SITE_OTHER): Payer: BLUE CROSS/BLUE SHIELD | Admitting: Pharmacist

## 2016-02-29 DIAGNOSIS — Z7901 Long term (current) use of anticoagulants: Secondary | ICD-10-CM

## 2016-02-29 DIAGNOSIS — Z8673 Personal history of transient ischemic attack (TIA), and cerebral infarction without residual deficits: Secondary | ICD-10-CM

## 2016-02-29 LAB — POCT INR: INR: 2.1

## 2016-02-29 NOTE — Progress Notes (Signed)
Reviewed thanks DrG 

## 2016-02-29 NOTE — Progress Notes (Signed)
Anti-Coagulation Progress Note  [place] [name] is a 56 y.o. male who is currently on an anti-coagulation regimen.    RECENT RESULTS: Recent results are below, the most recent result is correlated with a dose of 55 mg. per week: Lab Results  Component Value Date   INR 2.10 02/29/2016   INR 2.40 02/08/2016   INR 4.00 01/18/2016    ANTI-COAG DOSE: Anticoagulation Dose Instructions as of 02/29/2016      Glynis SmilesSun Mon Tue Wed Thu Fri Sat   New Dose 10 mg 7.5 mg 7.5 mg 10 mg 7.5 mg 7.5 mg 7.5 mg    Description   Take 10mg  warfarin on Sundays and Wednesdays--all other days--take 7.5mg .      ANTICOAG SUMMARY: Anticoagulation Episode Summary    Current INR goal:     TTR:   -  Next INR check:   03/28/2016  INR from last check:   2.10 (02/29/2016)  Weekly max dose:     Target end date:     INR check location:     Preferred lab:     Send INR reminders to:        Comments:           ANTICOAG TODAY: Anticoagulation Summary  As of 02/29/2016   INR goal:     TTR:     Today's INR:   2.10  Next INR check:   03/28/2016  Target end date:         Anticoagulation Episode Summary    INR check location:      Preferred lab:      Send INR reminders to:      Comments:         PATIENT INSTRUCTIONS: Patient instructed to take medications as defined in the Anti-coagulation Track section of this encounter.  Patient instructed to take today's dose.  Patient instructed to take 10mg  on Sundays and Wednesdays; 7.5mg  warfarin on all other days.  Patient verbalized understanding of these instructions.     FOLLOW-UP Return in 4 weeks (on 03/28/2016) for Follow up INR at 1000.  Hulen LusterJames Skylor Hughson, III Pharm.D., CACP

## 2016-02-29 NOTE — Patient Instructions (Signed)
Patient instructed to take medications as defined in the Anti-coagulation Track section of this encounter.  Patient instructed to take today's dose.  Patient instructed to take 10mg  on Sundays and Wednesdays; 7.5mg  warfarin on all other days.  Patient verbalized understanding of these instructions.

## 2016-03-22 ENCOUNTER — Other Ambulatory Visit: Payer: Self-pay | Admitting: Oncology

## 2016-03-25 ENCOUNTER — Ambulatory Visit (INDEPENDENT_AMBULATORY_CARE_PROVIDER_SITE_OTHER): Payer: BLUE CROSS/BLUE SHIELD | Admitting: Pharmacist

## 2016-03-25 DIAGNOSIS — Z7901 Long term (current) use of anticoagulants: Secondary | ICD-10-CM

## 2016-03-25 DIAGNOSIS — I825Z2 Chronic embolism and thrombosis of unspecified deep veins of left distal lower extremity: Secondary | ICD-10-CM

## 2016-03-25 LAB — POCT INR: INR: 2.4

## 2016-03-25 NOTE — Progress Notes (Signed)
Anticoagulation Management Brian Lloyd is a 56 y.o. male who reports to the clinic for monitoring of warfarin treatment.    Indication: DVT and aortic valve mass, history of CVA, antiphospholipid antibody syndrome  Duration: indefinite Supervising physician: Cephas DarbyJames Granfortuna  Anticoagulation Clinic Visit History: Patient does not report signs/symptoms of bleeding or thromboembolism Anticoagulation Episode Summary    Current INR goal:     TTR:   -  Next INR check:   04/11/2016  INR from last check:     Weekly max dose:     Target end date:     INR check location:     Preferred lab:     Send INR reminders to:        Comments:          ASSESSMENT Recent Results: Lab Results  Component Value Date   INR 2.4 03/25/2016   INR 2.10 02/29/2016   INR 2.40 02/08/2016    Anticoagulation Dosing: INR as of 03/25/2016 and Previous Dosing Information    INR Dt INR Goal Wkly Tot Sun Mon Tue Wed Thu Fri Sat     - 57.5 mg 10 mg 7.5 mg 7.5 mg 10 mg 7.5 mg 7.5 mg 7.5 mg    Previous description   Take 10mg  warfarin on Sundays and Wednesdays--all other days--take 7.5mg .   Anticoagulation Dose Instructions as of 03/25/2016      Total Sun Mon Tue Wed Thu Fri Sat   New Dose 57.5 mg 10 mg 7.5 mg 7.5 mg 10 mg 7.5 mg 7.5 mg 7.5 mg     (5 mg x 2)  (5 mg x 1.5)  (5 mg x 1.5)  (5 mg x 2)  (5 mg x 1.5)  (5 mg x 1.5)  (5 mg x 1.5)                         Description   Take 10mg  warfarin on Sundays and Wednesdays--all other days--take 7.5mg .     INR today: Therapeutic, need to clarify INR goal with team  PLAN Weekly dose was unchanged  Patient Instructions  Patient educated about medication as defined in this encounter and verbalized understanding by repeating back instructions provided.    Patient advised to contact clinic or seek medical attention if signs/symptoms of bleeding or thromboembolism occur.  Patient verbalized understanding by repeating back information and was  advised to contact me if further medication-related questions arise. Patient was also provided an information handout.  Follow-up 2 weeks  Marzetta BoardJennifer Fallan Mccarey

## 2016-03-25 NOTE — Patient Instructions (Signed)
Patient educated about medication as defined in this encounter and verbalized understanding by repeating back instructions provided.   

## 2016-03-28 NOTE — Progress Notes (Signed)
Reviewed. Thanks. DrG Target INR 2-3.5

## 2016-04-04 ENCOUNTER — Ambulatory Visit (INDEPENDENT_AMBULATORY_CARE_PROVIDER_SITE_OTHER): Payer: BLUE CROSS/BLUE SHIELD | Admitting: Pharmacist

## 2016-04-04 ENCOUNTER — Ambulatory Visit: Payer: BLUE CROSS/BLUE SHIELD | Admitting: Pharmacist

## 2016-04-04 DIAGNOSIS — Z8673 Personal history of transient ischemic attack (TIA), and cerebral infarction without residual deficits: Secondary | ICD-10-CM

## 2016-04-04 DIAGNOSIS — Z7901 Long term (current) use of anticoagulants: Secondary | ICD-10-CM | POA: Diagnosis not present

## 2016-04-04 LAB — POCT INR: INR: 4.3

## 2016-04-04 NOTE — Progress Notes (Signed)
Anti-Coagulation Progress Note  Brian Lloyd is a 56 y.o. male who is currently on an anti-coagulation regimen.    RECENT RESULTS: Recent results are below, the most recent result is correlated with a dose of 57.5 mg. per week: Lab Results  Component Value Date   INR 4.30 04/04/2016   INR 2.4 03/25/2016   INR 2.10 02/29/2016    ANTI-COAG DOSE: Anticoagulation Dose Instructions as of 04/04/2016      Glynis SmilesSun Mon Tue Wed Thu Fri Sat   New Dose 7.5 mg 7.5 mg 7.5 mg 7.5 mg 7.5 mg 7.5 mg 7.5 mg    Description   Take 1 & 1/2 tablets of your 5mg  peach-colored warfarin tablets by mouth once-daily at 6PM.       ANTICOAG SUMMARY: Anticoagulation Episode Summary    Current INR goal:   2.5-3.5  TTR:   61.1 % (1.4 wk)  Next INR check:   04/25/2016  INR from last check:   4.30! (04/04/2016)  Goal at result date:   2.0-3.5  Weekly max dose:     Target end date:     INR check location:     Preferred lab:     Send INR reminders to:        Comments:           ANTICOAG TODAY: Anticoagulation Summary  As of 04/04/2016   INR goal:   2.5-3.5  TTR:     Prior goal:   2.0-3.5  Today's INR:   4.30!  Next INR check:   04/25/2016  Target end date:         Anticoagulation Episode Summary    INR check location:      Preferred lab:      Send INR reminders to:      Comments:         PATIENT INSTRUCTIONS: Patient instructed to take medications as defined in the Anti-coagulation Track section of this encounter.  Patient instructed to take today's dose. Patient instructed to take 1 & 1/2 tablets of your 5mg  peach-colored warfarin tablets by mouth once-daily at [facility]6PM.   Patient verbalized understanding of these instructions.   FOLLOW-UP Return in 3 weeks (on 04/25/2016) for Follow up INR at 1045h.  Hulen LusterJames Groce, III Pharm.D., CACP

## 2016-04-04 NOTE — Patient Instructions (Signed)
Patient instructed to take medications as defined in the Anti-coagulation Track section of this encounter.  Patient instructed to take today's dose. Patient instructed to take 1 & 1/2 tablets of your 5mg  peach-colored warfarin tablets by mouth once-daily at [facility]6PM.   Patient verbalized understanding of these instructions.

## 2016-04-04 NOTE — Progress Notes (Signed)
Reviewed I would prefer he hold a dose if INR was 4.3 and repeat 1 week, not 3 Thanks DrG

## 2016-04-11 ENCOUNTER — Ambulatory Visit: Payer: BLUE CROSS/BLUE SHIELD | Admitting: Oncology

## 2016-04-18 ENCOUNTER — Encounter: Payer: Self-pay | Admitting: Oncology

## 2016-04-18 ENCOUNTER — Ambulatory Visit (INDEPENDENT_AMBULATORY_CARE_PROVIDER_SITE_OTHER): Payer: BLUE CROSS/BLUE SHIELD | Admitting: Oncology

## 2016-04-18 ENCOUNTER — Ambulatory Visit (INDEPENDENT_AMBULATORY_CARE_PROVIDER_SITE_OTHER): Payer: BLUE CROSS/BLUE SHIELD | Admitting: Pharmacist

## 2016-04-18 VITALS — BP 116/78 | HR 68 | Temp 97.8°F | Ht 68.0 in | Wt 192.6 lb

## 2016-04-18 DIAGNOSIS — Z8673 Personal history of transient ischemic attack (TIA), and cerebral infarction without residual deficits: Secondary | ICD-10-CM | POA: Diagnosis not present

## 2016-04-18 DIAGNOSIS — Z951 Presence of aortocoronary bypass graft: Secondary | ICD-10-CM | POA: Diagnosis not present

## 2016-04-18 DIAGNOSIS — D696 Thrombocytopenia, unspecified: Secondary | ICD-10-CM

## 2016-04-18 DIAGNOSIS — Z8679 Personal history of other diseases of the circulatory system: Secondary | ICD-10-CM | POA: Diagnosis not present

## 2016-04-18 DIAGNOSIS — R29818 Other symptoms and signs involving the nervous system: Secondary | ICD-10-CM

## 2016-04-18 DIAGNOSIS — R42 Dizziness and giddiness: Secondary | ICD-10-CM

## 2016-04-18 DIAGNOSIS — Z952 Presence of prosthetic heart valve: Secondary | ICD-10-CM | POA: Diagnosis not present

## 2016-04-18 DIAGNOSIS — I63119 Cerebral infarction due to embolism of unspecified vertebral artery: Secondary | ICD-10-CM

## 2016-04-18 DIAGNOSIS — I825Z2 Chronic embolism and thrombosis of unspecified deep veins of left distal lower extremity: Secondary | ICD-10-CM

## 2016-04-18 DIAGNOSIS — Z7982 Long term (current) use of aspirin: Secondary | ICD-10-CM | POA: Diagnosis not present

## 2016-04-18 DIAGNOSIS — D6861 Antiphospholipid syndrome: Secondary | ICD-10-CM | POA: Diagnosis not present

## 2016-04-18 DIAGNOSIS — Z7901 Long term (current) use of anticoagulants: Secondary | ICD-10-CM

## 2016-04-18 DIAGNOSIS — M3211 Endocarditis in systemic lupus erythematosus: Secondary | ICD-10-CM

## 2016-04-18 DIAGNOSIS — Z953 Presence of xenogenic heart valve: Secondary | ICD-10-CM

## 2016-04-18 LAB — POCT INR: INR: 3.2

## 2016-04-18 NOTE — Patient Instructions (Signed)
To lab today Schedule MRI/MRA brain within next 24 hours MD visit 4-6 months

## 2016-04-18 NOTE — Patient Instructions (Signed)
Patient instructed to take medications as defined in the Anti-coagulation Track section of this encounter.  Patient instructed to take today's dose.  Patient instructed to take 1  & 1/2 tablets of your 5mg peach-colored warfarin tablets by mouth, once-daily--at 6PM each day.  Patient verbalized understanding of these instructions.    

## 2016-04-18 NOTE — Progress Notes (Signed)
Hematology and Oncology Follow Up Visit  Brian Lloyd [id number] [birth year] 56 y.o. 04/18/2016 12:02 PM   Principle Diagnosis: Encounter Diagnoses  Name Primary?  . 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 Antiphospholipid antibody syndrome ([place])   . S/P aortic valve replacement with bioprosthetic valve   Clinical summary: 56 year old man I initially saw on 11/04/2015 for further evaluation of thrombocytopenia. Please see previous notes for full details. At that point he had been evaluated for an acute left cerebellar infarct with no cardiac risk factors. 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 No fever. No leukocytosis. No recent dental work. No elevation of ESR. No evidence for a PFO or ASD on TEE. 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 I ordered an antiphospholipid antibodies and values returned highly elevated. ANA and rheumatoid factor negative but subsequently tested anti-DNA double-stranded antibodies were elevated. He had no signs or symptoms of a collagen vascular disorder. Additional finding was that he had what appeared to be an embolic stroke while on therapeutic anticoagulation with Xarelto started for a left lower extremity DVT which occurred 2 months prior. He was changed to aspirin and Lovenox. While arrangements were being made for surgery on his valve, he had recurrent neurologic signs and symptoms with diplopia and vertigo. Repeat MRI brain now showed 2 new infarcts in the frontal lobes bilaterally. Surgery was expedited and done on November 02, 2015. He had a bioprosthetic aortic valve placed. 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 He did not require any blood products to get through the surgery. He had an uneventful postoperative course. It was difficult to monitor his platelet count due to the in vitro platelet clumping. Machine count got down as low as about 24,000. I did transfuse platelets at that point but maximum count posttransfusion only 39,000 which to me demonstrated that he does have an element of immune mediated platelet destruction as well.   Interim History: Overall doing well.  He went on a [place] with his wife.  He consumes a fair amount of alcohol and went off his usual diet.  INR was 4.3 on March 5.  He is currently on 7.5 mg of warfarin daily.  I will repeat a point-of-care INR today. He continues to have almost daily episodes of vertical diplopia and transient vertigo.  He had an episode in our office today while waiting for me which lasted longer than usual and felt like he was drunk and about to vomit. He is having some problems with short-term memory.  However he continues to function at a high level on his job. He has had no bleeding problems on Coumadin and low-dose aspirin and denies epistaxis, gum bleeding, hematuria, or hematochezia.  Medications: reviewed  Allergies:  Allergies  Allergen Reactions  . Naproxen Anaphylaxis    Review of Systems: See interim history Remaining ROS negative:   Physical Exam: Blood pressure 116/78, pulse 68, temperature 97.8 F (36.6 C), temperature source Oral, height 5' 8"  (1.727 m), weight 192 lb 9.6  oz (87.4 kg), SpO2 100 %. Wt Readings from Last 3 Encounters:  04/18/16 192 lb 9.6 oz (87.4 kg)  12/28/15 178 lb (80.7 kg)  12/14/15 180 lb 11.2 oz (82 kg)     General appearance: Well-nourished Caucasian man HENNT: Pharynx no erythema, exudate, mass, or ulcer. No thyromegaly  or thyroid nodules Lymph nodes: No cervical, supraclavicular, or axillary lymphadenopathy Breasts: Lungs: Clear to auscultation, resonant to percussion throughout Heart: Regular rhythm, no murmur, no gallop, no rub, no click, no edema Abdomen: Soft, nontender, normal bowel sounds, no mass, no organomegaly Extremities: No edema, no calf tenderness Musculoskeletal: no joint deformities GU:  Vascular: Carotid pulses 2+ on the left, 1+ on the right, no bruits, Neurologic: Alert, oriented, full extraocular movements, no nystagmus PERRLA, optic discs sharp and vessels normal, no hemorrhage or exudate, cranial nerves grossly normal, motor strength 5 over 5, reflexes 1+ symmetric, upper body coordination normal, gait normal, Skin: No rash or ecchymosis  Lab Results: CBC W/Diff    Component Value Date/Time   WBC 6.2 12/07/2015 0953   RBC 3.21 (L) 12/07/2015 0953   HGB 9.1 (L) 12/07/2015 0953   HCT 28.3 (L) 12/07/2015 0953   PLT  12/07/2015 0953    PLATELET CLUMPS NOTED ON SMEAR, COUNT APPEARS DECREASED   MCV 88.2 12/07/2015 0953   MCH 28.3 12/07/2015 0953   MCHC 32.2 12/07/2015 0953   RDW 13.6 12/07/2015 0953   LYMPHSABS 0.8 12/07/2015 0953   MONOABS 0.3 12/07/2015 0953   EOSABS 0.1 12/07/2015 0953   BASOSABS 0.0 12/07/2015 0953     Chemistry      Component Value Date/Time   NA 135 12/04/2015 0959   K 4.3 12/04/2015 0959   CL 102 12/04/2015 0959   CO2 28 12/04/2015 0959   BUN 14 12/04/2015 0959   CREATININE 1.38 (H) 12/04/2015 0959   CREATININE 1.27 10/26/2015 1114      Component Value Date/Time   CALCIUM 8.7 (L) 12/04/2015 0959   ALKPHOS 79 12/04/2015 0959   AST 42 (H) 12/04/2015 0959   ALT 79 (H) 12/04/2015 0959   BILITOT 0.5 12/04/2015 0959       Radiological Studies: No results found.  Impression:  #1. 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 He will continue on long-term anticoagulation with low-dose aspirin and full dose warfarin target INR 2.5-3.5.  #2. 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  #3. Provoked left lower extremity DVT antedating the diagnosis of #1.  #4.  Persistent neurologic symptoms. Although this may just reflect fixed deficits from previous embolic stroke/TIAs I would like to get a neurology opinion.  I am going to go ahead and get an MR I/MRA within the next 24 hours given the more prolonged episode of vertigo that he had today while in our office.  I did not detect any gross focal neurologic deficits on my exam.  CC: Patient Care Team: Corine Shelter, PA-C as PCP - General (Physician Assistant) Rexene Alberts, MD as Consulting Physician (Cardiothoracic Surgery) Sid Falcon, MD as Consulting Physician (Internal Medicine) Annia Belt, MD as Consulting Physician (Oncology) Leonie Man, MD as Consulting Physician (Cardiology)   Murriel Hopper, MD, [facility]  Hematology-Oncology/Internal Medicine     3/19/201812:02 PM

## 2016-04-18 NOTE — Progress Notes (Signed)
Anti-Coagulation Progress Note  Brian Lloyd is a 56 y.o. male who is currently on an anti-coagulation regimen.    RECENT RESULTS: Recent results are below, the most recent result is correlated with a dose of 52.5 mg. per week: Lab Results  Component Value Date   INR 3.2 04/18/2016   INR 4.30 04/04/2016   INR 2.4 03/25/2016    ANTI-COAG DOSE: Anticoagulation Dose Instructions as of 04/18/2016      Glynis SmilesSun Mon Tue Wed Thu Fri Sat   New Dose 7.5 mg 7.5 mg 7.5 mg 7.5 mg 7.5 mg 7.5 mg 7.5 mg    Description   Take 1 & 1/2 tablets of your 5mg  peach-colored warfarin tablets by mouth once-daily at 6PM.       ANTICOAG SUMMARY: Anticoagulation Episode Summary    Current INR goal:   2.5-3.5  TTR:   41.7 % (3.4 wk)  Next INR check:   05/16/2016  INR from last check:   3.2 (04/18/2016)  Weekly max dose:     Target end date:     INR check location:     Preferred lab:     Send INR reminders to:        Comments:           ANTICOAG TODAY: Anticoagulation Summary  As of 04/18/2016   INR goal:   2.5-3.5  TTR:     Today's INR:   3.2  Next INR check:   05/16/2016  Target end date:         Anticoagulation Episode Summary    INR check location:      Preferred lab:      Send INR reminders to:      Comments:         PATIENT INSTRUCTIONS: Patient Instructions  Patient instructed to take medications as defined in the Anti-coagulation Track section of this encounter.  Patient instructed to take today's dose.  Patient instructed to take 1 & 1/2 tablets of your 5mg  peach-colored warfarin tablets by mouth, once-daily at [facility]6PM each day.  Patient verbalized understanding of these instructions.       FOLLOW-UP Return in 4 weeks (on 05/16/2016) for Follow up INR at 1000h.  Hulen LusterJames Boen Sterbenz, III Pharm.D., CACP

## 2016-04-19 LAB — CBC WITH DIFFERENTIAL/PLATELET
Basophils Absolute: 0 10*3/uL (ref 0.0–0.2)
Basos: 1 %
EOS (ABSOLUTE): 0.2 10*3/uL (ref 0.0–0.4)
Eos: 3 %
HEMOGLOBIN: 15.1 g/dL (ref 13.0–17.7)
Hematocrit: 44.2 % (ref 37.5–51.0)
IMMATURE GRANULOCYTES: 0 %
Immature Grans (Abs): 0 10*3/uL (ref 0.0–0.1)
Lymphocytes Absolute: 1.2 10*3/uL (ref 0.7–3.1)
Lymphs: 22 %
MCH: 28 pg (ref 26.6–33.0)
MCHC: 34.2 g/dL (ref 31.5–35.7)
MCV: 82 fL (ref 79–97)
MONOCYTES: 6 %
MONOS ABS: 0.3 10*3/uL (ref 0.1–0.9)
NEUTROS PCT: 68 %
Neutrophils Absolute: 3.7 10*3/uL (ref 1.4–7.0)
RBC: 5.4 x10E6/uL (ref 4.14–5.80)
RDW: 15 % (ref 12.3–15.4)
WBC: 5.3 10*3/uL (ref 3.4–10.8)

## 2016-04-19 LAB — COMPREHENSIVE METABOLIC PANEL
ALBUMIN: 4.7 g/dL (ref 3.5–5.5)
ALK PHOS: 61 IU/L (ref 39–117)
ALT: 40 IU/L (ref 0–44)
AST: 33 IU/L (ref 0–40)
Albumin/Globulin Ratio: 1.4 (ref 1.2–2.2)
BUN / CREAT RATIO: 16 (ref 9–20)
BUN: 18 mg/dL (ref 6–24)
Bilirubin Total: 0.5 mg/dL (ref 0.0–1.2)
CO2: 22 mmol/L (ref 18–29)
CREATININE: 1.11 mg/dL (ref 0.76–1.27)
Calcium: 9.4 mg/dL (ref 8.7–10.2)
Chloride: 100 mmol/L (ref 96–106)
GFR calc Af Amer: 85 mL/min/{1.73_m2} (ref >59)
GFR calc non Af Amer: 73 mL/min/{1.73_m2} (ref >59)
GLUCOSE: 116 mg/dL — AB (ref 65–99)
Globulin, Total: 3.3 g/dL (ref 1.5–4.5)
Potassium: 4.6 mmol/L (ref 3.5–5.2)
Sodium: 138 mmol/L (ref 134–144)
Total Protein: 8 g/dL (ref 6.0–8.5)

## 2016-04-19 LAB — BETA-2-GLYCOPROTEIN I ABS, IGG/M/A
Beta-2 Glyco 1 IgA: 38 GPI IgA units — ABNORMAL HIGH (ref 0–25)
Beta-2 Glyco 1 IgM: 70 GPI IgM units — ABNORMAL HIGH (ref 0–32)

## 2016-04-19 LAB — CARDIOLIPIN ANTIBODIES, IGG, IGM, IGA
ANTICARDIOLIPIN IGG: 147 GPL U/mL — AB (ref 0–14)
ANTICARDIOLIPIN IGM: 40 [MPL'U]/mL — AB (ref 0–12)

## 2016-04-19 LAB — SEDIMENTATION RATE: SED RATE: 4 mm/h (ref 0–30)

## 2016-04-19 NOTE — Progress Notes (Signed)
Reviewed Thanks DrG 

## 2016-04-20 LAB — HEXAGONAL PHASE PHOSPHOLIPID: HEXAGONAL PHASE PHOSPHOLIPID: 49 s — AB (ref 0–11)

## 2016-04-20 LAB — DRVVT MIX: DRVVT MIX: 91.9 s — AB (ref 0.0–47.0)

## 2016-04-20 LAB — LUPUS ANTICOAGULANT PANEL: Dilute Viper Venom Time: 144.4 s — ABNORMAL HIGH (ref 0.0–47.0)

## 2016-04-20 LAB — DRVVT CONFIRM: DRVVT CONFIRM: 2.3 ratio — AB (ref 0.8–1.2)

## 2016-04-20 LAB — PTT-LA MIX: PTT-LA Mix: 106.2 s — ABNORMAL HIGH (ref 0.0–48.9)

## 2016-04-21 ENCOUNTER — Other Ambulatory Visit: Payer: Self-pay | Admitting: Oncology

## 2016-04-21 ENCOUNTER — Telehealth: Payer: Self-pay | Admitting: *Deleted

## 2016-04-21 NOTE — Telephone Encounter (Signed)
-----   Message from Levert FeinsteinJames M Granfortuna, MD sent at 04/19/2016  1:43 PM EDT ----- Roxan HockeyGelenda - please Call pt: INR in acceptable range at 3.2. Antiphospholipid antibody levels remain elevated: not surprising

## 2016-04-21 NOTE — Telephone Encounter (Addendum)
Pt called / informed "INR in acceptable range at 3.2.  Antiphospholipid antibody levels remain elevated: not surprising" per Dr Cyndie ChimeGranfortuna. Stated he had already talked to Dr Alexandria LodgeGroce. Also has change in insurance - he has canceled MRI appt, - wants order faxed to [facility] fax# [phone] which has been done. And stated will schedule own appt .

## 2016-04-22 ENCOUNTER — Ambulatory Visit (HOSPITAL_COMMUNITY): Payer: BLUE CROSS/BLUE SHIELD

## 2016-04-22 NOTE — Telephone Encounter (Addendum)
I called [facility] radiology- stated correct fax# is [phone]. Then she stated they are on EPIC and she can see the order;will call pt to schedule. Order faxed also.  Also called pt to let him know someone will be calling to schedule MRI/MRA.

## 2016-04-22 NOTE — Telephone Encounter (Signed)
thanks

## 2016-04-23 DIAGNOSIS — H538 Other visual disturbances: Secondary | ICD-10-CM | POA: Diagnosis not present

## 2016-04-23 DIAGNOSIS — H532 Diplopia: Secondary | ICD-10-CM | POA: Diagnosis not present

## 2016-04-23 DIAGNOSIS — R42 Dizziness and giddiness: Secondary | ICD-10-CM | POA: Diagnosis not present

## 2016-04-23 DIAGNOSIS — R51 Headache: Secondary | ICD-10-CM | POA: Diagnosis not present

## 2016-04-26 NOTE — Telephone Encounter (Signed)
Called pt to f/u on MRI - stated he had it done at [facility]. Also he will call and have a copy of report sent to Dr Reece AgarG.

## 2016-04-26 NOTE — Telephone Encounter (Signed)
Pt called and left message - stated [facility] in [place] suppose to give him a CD which he will take to Neurologist on Thursday. And bring us a copy later.

## 2016-04-26 NOTE — Telephone Encounter (Signed)
Since [facility], [facility]

## 2016-04-28 ENCOUNTER — Encounter: Payer: Self-pay | Admitting: Neurology

## 2016-04-28 ENCOUNTER — Ambulatory Visit (INDEPENDENT_AMBULATORY_CARE_PROVIDER_SITE_OTHER): Payer: BLUE CROSS/BLUE SHIELD | Admitting: Neurology

## 2016-04-28 VITALS — BP 120/84 | HR 71 | Ht 68.0 in | Wt 191.6 lb

## 2016-04-28 DIAGNOSIS — G450 Vertebro-basilar artery syndrome: Secondary | ICD-10-CM

## 2016-04-28 DIAGNOSIS — G43809 Other migraine, not intractable, without status migrainosus: Secondary | ICD-10-CM

## 2016-04-28 MED ORDER — DIVALPROEX SODIUM ER 500 MG PO TB24: 500.0000 mg | ORAL_TABLET | Freq: Every day | ORAL | 3 refills | Status: DC

## 2016-04-28 MED ORDER — ELETRIPTAN HYDROBROMIDE 40 MG PO TABS: 40.0000 mg | ORAL_TABLET | ORAL | 0 refills | Status: DC | PRN

## 2016-04-28 NOTE — Progress Notes (Addendum)
[facility] [address] [place]. [place] [address] [phone]       OFFICE CONSULT NOTE  Brian. Brian Lloyd Date of Birth:  1960-09-26 Medical Record Number:  [id number]   Referring MD:  Murriel Hopper  Reason for Referral:   Vertigo, diplopia  HPI: Brian Lloyd is a pleasant 74 year caucasian male  who is accompanied by his wife who complements his history. I have also  reviewed his [place] records and have personally reviewed imaging studies. The patient states he was fine until June of last year when he was pushing up to 50 pound generator up a hill when he felt sudden strain behind his left knee. He developed swelling and pain which lasted 10 days. He was seen in sports medicine clinic and diagnosed with muscular strain and possible tear of his plantaris. However the swelling persisted and he underwent lower extremity venous Doppler which was positive for DVT. He was anticoagulated with Xarelto for 2 weeks later had an episode of severe dizziness and double vision which lasted only a few minutes and started having brief recurrent episodes of double vision with mild dizziness which lasted for several months. He underwent MRI scan of the brain on 10/06/15 which are personally reviewed showed a small acute lacunar infarct in the left cerebellum. Carotid Dopplers unremarkable. 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 He however had no fevers and ESR was normal. 2 sets of blood cultures were negative. Transesophageal echocardiogram confirmed the mobile mass on the ventricular surface of the aortic valve. The patient underwent replacement of the aortic valve with a bioprosthetic valve by cardiac surgeon. He was at that time also found to have thrombocytopenia with clamping of his platelets. 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 He has been switched to aspirin and  warfarin and sees Dr. Macy Mis. Patient stated that his these spells of transient dizziness and blurred vision with occasional vertical diplopia or true vertigo lasting few minutes have been continuing and occur on a daily basis. He does not identify any specific triggers for these episodes. He always feels better if he wears reading glasses and tries to read. These occur several times a day. He at times also describes a feeling of seeing fireworks going across his vision. He does admit to history of long-standing migraine headaches but for last several months he is had chronic headaches but these are now more localized to the back of his head. The constant did fluctuate in severity from 4/10-10/10. There is some complaintof  nausea and only occasional vomiting. Is sensitive to light and sound and has to lie down and turn off the lights. Patient is currently taking Tylenol and hydrocodone on a daily basis for these headaches which relieved the headaches recur. He has never been on medications for migraine prophylaxis. He has not tried to plans for his migraines. He had MRA of the brain done on 04/23/16 at [facility] in [place] and have personally reviewed the films which show no significant large vessel intracranial stenosis. 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  ROS:   14 system review of systems is positive for blurred and double vision, ringing in the ears, spinning sensation, rash, joint pain, dizziness, vertigo, restless legs, headache and all other systems negative PMH:  Past Medical History:  Diagnosis Date  . 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 Antiphospholipid antibody syndrome ([place]) 11/06/2015   11/04/15 significant elevation of IgG anticardiolipin & beta-2-GP-1 antibodies  . 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 Aortic valve mass   . 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 Cerebellar infarct ([place]) 10/06/2015   Cardiac MRI showed acute to subacute lacunar infarct in the left cerebellum  . DVT (deep venous thrombosis) ([facility])    left leg, on Xarelto  . 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 S/P aortic valve replacement with bioprosthetic valve 11/24/2015   25 mm [facility] Ease bovine pericardial tissue valve  . Thrombocytopenia ([place])     Social History:  Social History   Social History  . Marital status: Married    Spouse name: N/A  . Number of children: N/A  . Years of education: N/A   Occupational History  . Not on file.   Social History Main Topics  . Smoking status: Never Smoker  . Smokeless tobacco: Never Used  . Alcohol use 1.8 oz/week    1 Glasses of wine, 1 Cans of beer, 1 Shots of liquor per week     Comment: rarely  . Drug use: No  . Sexual activity: Yes   Other Topics Concern  . Not on file   Social History Narrative  . No narrative on file    Medications:   Current Outpatient Prescriptions on File Prior to Visit  Medication Sig Dispense Refill  . acetaminophen (TYLENOL) 500 MG tablet Take 1,000 mg by mouth every 6 (six) hours as needed for mild pain or headache.    Marland Kitchen aspirin EC 81 MG tablet Take 1 tablet (81 mg total) by mouth daily. Patient states he has taken aspirin in the past without getting a reaction    . psyllium (METAMUCIL) 58.6 % packet Take 1 packet by mouth daily.    . Tetrahydrozoline HCl (VISINE OP) Apply 1 drop to eye daily as needed (dry eyes).    . warfarin (COUMADIN) 5 MG tablet TAKE 2 TABLETS BY MOUTH ON SUNDAYS & THURSDAYS. ALL OTHER DAYS TAKE 1.5 TABLETS. TAKE DAILY AT 6 PM. 50 tablet 6   No current facility-administered medications on file prior to visit.     Allergies:   Allergies  Allergen Reactions   . Naproxen Anaphylaxis    Physical Exam General: well developed, well nourished 59 Caucasian male, seated, in no evident distress Head: head normocephalic and atraumatic.   Neck: supple with no carotid or supraclavicular bruits Cardiovascular: regular rate and rhythm, no murmurs Musculoskeletal: no deformity Skin:  no rash/petichiae Vascular:  Normal pulses all extremities  Neurologic Exam Mental Status: Awake and fully alert. Oriented to place and time. Recent and remote memory intact. Attention span, concentration and fund of knowledge appropriate. Mood and affect appropriate.  Cranial Nerves: Fundoscopic exam reveals sharp disc margins. Pupils equal, briskly reactive to light. Extraocular movements full without nystagmus. Visual fields full to confrontation. Hearing intact. Facial sensation intact. Face, tongue, palate moves normally and symmetrically.  Motor: Normal bulk and tone. Normal strength in all tested extremity muscles. Sensory.: intact to touch , pinprick , position and vibratory sensation.  Coordination: Rapid alternating movements normal in all extremities. Finger-to-nose and heel-to-shin performed accurately bilaterally. Gait and Station: Arises from chair without difficulty. Stance is normal. Gait demonstrates normal stride length  and balance . Able to heel, toe and tandem walk without difficulty.  Reflexes: 1+ and symmetric. Toes downgoing.   NIHSS  0 Modified Rankin  0   ASSESSMENT: 20 year Caucasian male with recurrent transient stereotypical episodes of dizziness, blurred vision and diplopia in the setting of chronic headaches possibly completed migraine episodes. Vertebral basilar TIAs less likely. His chronic daily headaches likely represent on for migraine headaches with component of analgesic rebound. 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 I recommend checking MRA of the neck with and without contrast as well as MRI scan of the brain. Start Depakote ER 500 mg daily for migraine prophylaxis and Relpax 40 mg for symptomatic relief. I have discussed possible side effects of both medications the patient and advised him to call me if needed. I have also counseled him to quit taking Tylenol and hydrocodone as he clearly has a component of analgesic rebound headache. I also advised him to do regular neck stretching exercises. He will continue on aspirin and warfarin for his primary antiphospholipid antibody syndrome. Greater than 50% time during this 45 minute consultation visit was spent on counseling and coordination of care about his recurrent stereotypical episodes, migraines, discussion of evaluation and treatment plan and answering questions Return for follow-up with me in 2 months or call earlier if necessary Antony Contras, MD  [facility] [address] [place], [place] [address]  Phone [phone] Fax [phone] Note: This document was prepared with digital dictation and possible smart phrase technology. Any transcriptional errors that result from this process are unintentional.

## 2016-04-28 NOTE — Patient Instructions (Signed)
I had a long discussion with the patient and his wife regarding his chronic headaches as well as transient episodes of visual dysfunction, dizziness and blurred vision likely representing complicated migraine rather than vertebrobasilar TIAs. I recommend checking MRA of the neck with and without contrast as well as MRI scan of the brain. Start Depakote ER 500 mg daily for migraine prophylaxis and Relpax 40 mg for symptomatic relief. I have discussed possible side effects of both medications the patient and advised him to call me if needed. I have also counseled him to quit taking Tylenol and hydrocodone as he clearly has a component of analgesic rebound headache. I also advised him to do regular neck stretching exercises. He will continue on aspirin and warfarin for his primary antiphospholipid antibody syndrome. Return for follow-up with me in 2 months or call earlier if necessary   Migraine Headache A migraine headache is a very strong throbbing pain on one side or both sides of your head. Migraines can also cause other symptoms. Talk with your doctor about what things may bring on (trigger) your migraine headaches. Follow these instructions at home: Medicines   Take over-the-counter and prescription medicines only as told by your doctor.  Do not drive or use heavy machinery while taking prescription pain medicine.  To prevent or treat constipation while you are taking prescription pain medicine, your doctor may recommend that you:  Drink enough fluid to keep your pee (urine) clear or pale yellow.  Take over-the-counter or prescription medicines.  Eat foods that are high in fiber. These include fresh fruits and vegetables, whole grains, and beans.  Limit foods that are high in fat and processed sugars. These include fried and sweet foods. Lifestyle   Avoid alcohol.  Do not use any products that contain nicotine or tobacco, such as cigarettes and e-cigarettes. If you need help quitting, ask  your doctor.  Get at least 8 hours of sleep every night.  Limit your stress. General instructions    Keep a journal to find out what may bring on your migraines. For example, write down:  What you eat and drink.  How much sleep you get.  Any change in what you eat or drink.  Any change in your medicines.  If you have a migraine:  Avoid things that make your symptoms worse, such as bright lights.  It may help to lie down in a dark, quiet room.  Do not drive or use heavy machinery.  Ask your doctor what activities are safe for you.  Keep all follow-up visits as told by your doctor. This is important. Contact a doctor if:  You get a migraine that is different or worse than your usual migraines. Get help right away if:  Your migraine gets very bad.  You have a fever.  You have a stiff neck.  You have trouble seeing.  Your muscles feel weak or like you cannot control them.  You start to lose your balance a lot.  You start to have trouble walking.  You pass out (faint). This information is not intended to replace advice given to you by your health care provider. Make sure you discuss any questions you have with your health care provider. Document Released: 10/27/2007 Document Revised: 08/07/2015 Document Reviewed: 07/06/2015 Elsevier Interactive Patient Education  2017 [employer]

## 2016-05-12 DIAGNOSIS — M47812 Spondylosis without myelopathy or radiculopathy, cervical region: Secondary | ICD-10-CM | POA: Diagnosis not present

## 2016-05-12 DIAGNOSIS — Z13220 Encounter for screening for lipoid disorders: Secondary | ICD-10-CM | POA: Diagnosis not present

## 2016-05-12 DIAGNOSIS — Z0000 Encounter for general adult medical examination without abnormal findings: Secondary | ICD-10-CM | POA: Diagnosis not present

## 2016-05-12 DIAGNOSIS — Z131 Encounter for screening for diabetes mellitus: Secondary | ICD-10-CM | POA: Diagnosis not present

## 2016-05-16 ENCOUNTER — Ambulatory Visit (INDEPENDENT_AMBULATORY_CARE_PROVIDER_SITE_OTHER): Payer: BLUE CROSS/BLUE SHIELD | Admitting: Pharmacist

## 2016-05-16 DIAGNOSIS — Z7901 Long term (current) use of anticoagulants: Secondary | ICD-10-CM

## 2016-05-16 LAB — POCT INR: INR: 3.2

## 2016-05-16 NOTE — Progress Notes (Signed)
Reviewed thx DrG 

## 2016-05-16 NOTE — Progress Notes (Signed)
Anti-Coagulation Progress Note  Brian Lloyd is a 56 y.o. male who is currently on an anti-coagulation regimen.    RECENT RESULTS: Recent results are below, the most recent result is correlated with a dose of 52.5 mg. per week: Lab Results  Component Value Date   INR 3.20 05/16/2016   INR 3.2 04/18/2016   INR 4.30 04/04/2016    ANTI-COAG DOSE: Anticoagulation Dose Instructions as of 05/16/2016      Glynis Smiles Tue Wed Thu Fri Sat   New Dose 7.5 mg 7.5 mg 7.5 mg 7.5 mg 7.5 mg 7.5 mg 7.5 mg    Description   Take 1 & 1/2 tablets of your  peach-colored warfarin tablets by mouth once-daily at 6PM.       ANTICOAG SUMMARY: Anticoagulation Episode Summary    Current INR goal:   2.5-3.5  TTR:   72.8 % (1.7 mo)  Next INR check:   06/13/2016  INR from last check:   3.20 (05/16/2016)  Weekly max dose:     Target end date:     INR check location:     Preferred lab:     Send INR reminders to:        Comments:           ANTICOAG TODAY: Anticoagulation Summary  As of 05/16/2016   INR goal:   2.5-3.5  TTR:     Today's INR:   3.20  Next INR check:   06/13/2016  Target end date:         Anticoagulation Episode Summary    INR check location:      Preferred lab:      Send INR reminders to:      Comments:         PATIENT INSTRUCTIONS: Patient Instructions  Patient instructed to take medications as defined in the Anti-coagulation Track section of this encounter.  Patient instructed to take today's dose.  Patient instructed to take 1 & 1/2 tablets of your peach-colored  strength warfarin tablets by mouth, once-daily, at [facility].  Patient verbalized understanding of these instructions.       FOLLOW-UP Return in 4 weeks (on 06/13/2016) for Follow up INR at 1030h.  Hulen Luster, III Pharm.D., CACP

## 2016-05-16 NOTE — Patient Instructions (Signed)
Patient instructed to take medications as defined in the Anti-coagulation Track section of this encounter.  Patient instructed to take today's dose.  Patient instructed to take 1 & 1/2 tablets of your peach-colored  strength warfarin tablets by mouth, once-daily, at [facility].  Patient verbalized understanding of these instructions.

## 2016-06-09 ENCOUNTER — Telehealth: Payer: Self-pay

## 2016-06-09 NOTE — Telephone Encounter (Signed)
Per pharmacy patient reports he takes 7.5 mg and would like a 90 day supply please clarify and send new RX

## 2016-06-10 DIAGNOSIS — Z13220 Encounter for screening for lipoid disorders: Secondary | ICD-10-CM | POA: Diagnosis not present

## 2016-06-10 DIAGNOSIS — Z131 Encounter for screening for diabetes mellitus: Secondary | ICD-10-CM | POA: Diagnosis not present

## 2016-06-10 MED ORDER — WARFARIN SODIUM 5 MG PO TABS: 7.5000 mg | ORAL_TABLET | Freq: Every day | ORAL | 6 refills | Status: DC

## 2016-06-10 NOTE — Telephone Encounter (Signed)
Spoke with Dr. Selena BattenKim who will send in refill

## 2016-06-13 ENCOUNTER — Ambulatory Visit (INDEPENDENT_AMBULATORY_CARE_PROVIDER_SITE_OTHER): Payer: BLUE CROSS/BLUE SHIELD | Admitting: Pharmacist

## 2016-06-13 DIAGNOSIS — D6859 Other primary thrombophilia: Secondary | ICD-10-CM | POA: Diagnosis not present

## 2016-06-13 DIAGNOSIS — Z7901 Long term (current) use of anticoagulants: Secondary | ICD-10-CM | POA: Diagnosis not present

## 2016-06-13 LAB — POCT INR: INR: 3.4

## 2016-06-13 NOTE — Patient Instructions (Signed)
Patient instructed to take medications as defined in the Anti-coagulation Track section of this encounter.  Patient instructed to take today's dose.  Patient instructed to take 1 & 1/2 tablets of your 5mg  peach-colored warfarin tablets by mouth once-daily at 6PM except on Wednesdays--take only 1 tablet on Wednesdays. Patient verbalized understanding of these instructions.

## 2016-06-13 NOTE — Telephone Encounter (Signed)
Thanks Dr. Kim!

## 2016-06-13 NOTE — Progress Notes (Signed)
Anti-Coagulation Progress Note  Laurina BustleWayne Melling is a 56 y.o. male who is currently on an anti-coagulation regimen for hypercoagulable state with multiple venous and arterial embolic events seen by Dr. Cyndie ChimeGranfortuna for continued anticoagulation.     RECENT RESULTS: Recent results are below, the most recent result is correlated with a dose of 52.5 mg. per week: Lab Results  Component Value Date   INR 3.40 06/13/2016   INR 3.20 05/16/2016   INR 3.2 04/18/2016    ANTI-COAG DOSE: Anticoagulation Warfarin Dose Instructions as of 06/13/2016      Glynis SmilesSun Mon Tue Wed Thu Fri Sat   New Dose 7.5 mg 7.5 mg 7.5 mg 5 mg 7.5 mg 7.5 mg 7.5 mg    Description   Take 1 & 1/2 tablets of your 5mg  peach-colored warfarin tablets by mouth once-daily at 6PM except on Wednesdays--take only 1 tablet on Wednesdays.   DR Patsy LagerGRANFORTUNA'S PATIENT (route warfarin notes and list as authorizing provider for LOS & Follow-up, lab orders and warfarin prescriptions), do not list attending physician      ANTICOAG SUMMARY:   ANTICOAG TODAY: Anticoagulation Summary  As of 06/13/2016   INR goal:   2.5-3.5  TTR:     Today's INR:   3.40  Next INR check:     Target end date:         Anticoagulation Episode Summary    INR check location:      Preferred lab:      Resolved date:   06/13/2016   Resolved reason:   Other   Send INR reminders to:      Comments:         PATIENT INSTRUCTIONS: Patient Instructions  Patient instructed to take medications as defined in the Anti-coagulation Track section of this encounter.  Patient instructed to take today's dose.  Patient instructed to take 1 & 1/2 tablets of your 5mg  peach-colored warfarin tablets by mouth once-daily at 6PM except on Wednesdays--take only 1 tablet on Wednesdays. Patient verbalized understanding of these instructions.       FOLLOW-UP Return in 4 weeks (on 07/11/2016) for Follow up INR at 1030h.  Hulen LusterJames Groce, III Pharm.D., CACP

## 2016-06-14 NOTE — Progress Notes (Signed)
Reviewed Thanks DrG 

## 2016-07-04 ENCOUNTER — Ambulatory Visit: Payer: BLUE CROSS/BLUE SHIELD | Admitting: Neurology

## 2016-07-04 ENCOUNTER — Ambulatory Visit (INDEPENDENT_AMBULATORY_CARE_PROVIDER_SITE_OTHER): Payer: BLUE CROSS/BLUE SHIELD | Admitting: Pharmacist

## 2016-07-04 DIAGNOSIS — Z953 Presence of xenogenic heart valve: Secondary | ICD-10-CM | POA: Diagnosis not present

## 2016-07-04 DIAGNOSIS — I639 Cerebral infarction, unspecified: Secondary | ICD-10-CM

## 2016-07-04 DIAGNOSIS — D6861 Antiphospholipid syndrome: Secondary | ICD-10-CM

## 2016-07-04 DIAGNOSIS — I825Z2 Chronic embolism and thrombosis of unspecified deep veins of left distal lower extremity: Secondary | ICD-10-CM

## 2016-07-04 DIAGNOSIS — Z8673 Personal history of transient ischemic attack (TIA), and cerebral infarction without residual deficits: Secondary | ICD-10-CM | POA: Diagnosis not present

## 2016-07-04 DIAGNOSIS — I63119 Cerebral infarction due to embolism of unspecified vertebral artery: Secondary | ICD-10-CM

## 2016-07-04 DIAGNOSIS — Z7901 Long term (current) use of anticoagulants: Secondary | ICD-10-CM

## 2016-07-04 LAB — POCT INR: INR: 2.1

## 2016-07-04 NOTE — Progress Notes (Signed)
Anti-Coagulation Progress Note  [name] is a 56 y.o. male who is currently on an anti-coagulation regimen.    RECENT RESULTS: Recent results are below, the most recent result is correlated with a dose of 50 mg. per week: Lab Results  Component Value Date   INR 2.10 07/04/2016   INR 3.40 06/13/2016   INR 3.20 05/16/2016    ANTI-COAG DOSE: Anticoagulation Warfarin Dose Instructions as of 07/04/2016      Glynis SmilesSun Mon Tue Wed Thu Fri Sat   New Dose 7.5 mg 7.5 mg 7.5 mg 7.5 mg 7.5 mg 7.5 mg 7.5 mg    Description   Take 1 & 1/2 tablets of your 5mg  peach-colored warfarin tablets by mouth, once-daily, at [facility]6PM each day.       ANTICOAG SUMMARY: Anticoagulation Episode Summary    Current INR goal:   2.5-3.5  TTR:   79.6 % (3.4 mo)  Next INR check:   08/01/2016  INR from last check:   2.10! (07/04/2016)  Weekly max warfarin dose:     Target end date:   Indefinite  INR check location[facility]   [facility]  Preferred lab:     Send INR reminders to:      Indications   Acute cerebral infarction ([facility]CC) [I63.9] Antiphospholipid antibody syndrome (HCC) [D68.61] 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) [I82.5Z2] Cerebrovascular accident (CVA) due to embolism of vertebral artery (HCC) [I63.119] History of CVA (cerebrovascular accident) without residual deficits [Z86.73] S/P aortic valve replacement with bioprosthetic valve [Z95.3]       Comments:   Patient commenced attending this clinic on 11/12/2015        ANTICOAG TODAY: Anticoagulation Summary  As of 07/04/2016   INR goal:   2.5-3.5  TTR:     Today's INR:   2.10!  Next INR check:   08/01/2016  Target end date:   Indefinite   Indications   Acute cerebral infarction (HCC) [I63.9] Antiphospholipid antibody syndrome (HCC) [D68.61] 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) [I82.5Z2] Cerebrovascular accident (CVA) due to embolism of vertebral artery (HCC)  [I63.119] History of CVA (cerebrovascular accident) without residual deficits [Z86.73] S/P aortic valve replacement with bioprosthetic valve [Z95.3]        Anticoagulation Episode Summary    INR check location[facility]   [facility]   Preferred lab:      Send INR reminders to:      Comments:   Patient commenced attending this clinic on 11/12/2015      PATIENT INSTRUCTIONS: Patient Instructions  Patient instructed to take medications as defined in the Anti-coagulation Track section of this encounter.  Patient instructed to take today's dose.  Patient instructed to take 1 & 1/2 tablets of your 5mg  peach-colored warfarin tablets by mouth, once-daily, at [facility]6PM each day.  Patient verbalized understanding of these instructions.      FOLLOW-UP Return in 4 weeks (on 08/01/2016) for Follow up INR at 0945h.  Hulen LusterJames Abbagail Scaff, III Pharm.D., CACP

## 2016-07-04 NOTE — Progress Notes (Signed)
Reviewed Thanks DrG 

## 2016-07-04 NOTE — Patient Instructions (Signed)
Patient instructed to take medications as defined in the Anti-coagulation Track section of this encounter.  Patient instructed to take today's dose.  Patient instructed to take 1 & 1/2 tablets of your 5mg peach-colored warfarin tablets by mouth, once-daily, at 6PM each day.  Patient verbalized understanding of these instructions.     

## 2016-07-11 ENCOUNTER — Ambulatory Visit: Payer: BLUE CROSS/BLUE SHIELD

## 2016-08-01 ENCOUNTER — Ambulatory Visit: Payer: BLUE CROSS/BLUE SHIELD

## 2016-08-08 ENCOUNTER — Ambulatory Visit (INDEPENDENT_AMBULATORY_CARE_PROVIDER_SITE_OTHER): Payer: BLUE CROSS/BLUE SHIELD | Admitting: Pharmacist

## 2016-08-08 DIAGNOSIS — Z8673 Personal history of transient ischemic attack (TIA), and cerebral infarction without residual deficits: Secondary | ICD-10-CM

## 2016-08-08 DIAGNOSIS — Z7901 Long term (current) use of anticoagulants: Secondary | ICD-10-CM

## 2016-08-08 DIAGNOSIS — I639 Cerebral infarction, unspecified: Secondary | ICD-10-CM

## 2016-08-08 DIAGNOSIS — Z952 Presence of prosthetic heart valve: Secondary | ICD-10-CM | POA: Diagnosis not present

## 2016-08-08 DIAGNOSIS — I825Z2 Chronic embolism and thrombosis of unspecified deep veins of left distal lower extremity: Secondary | ICD-10-CM | POA: Diagnosis not present

## 2016-08-08 DIAGNOSIS — I63119 Cerebral infarction due to embolism of unspecified vertebral artery: Secondary | ICD-10-CM

## 2016-08-08 DIAGNOSIS — D6861 Antiphospholipid syndrome: Secondary | ICD-10-CM | POA: Diagnosis not present

## 2016-08-08 DIAGNOSIS — Z953 Presence of xenogenic heart valve: Secondary | ICD-10-CM

## 2016-08-08 LAB — POCT INR: INR: 4.7

## 2016-08-08 NOTE — Patient Instructions (Signed)
Patient instructed to take medications as defined in the Anti-coagulation Track section of this encounter.  Patient instructed to take today's dose.  Patient instructed to take 1 & 1/2 tablets of your 5mg peach-colored warfarin tablets by mouth, once-daily, at 6PM each day all days of week--EXCEPT on Mondays and Thursdays--take ONLY ONE (1) tablet on Mondays and Thursdays Patient verbalized understanding of these instructions.    

## 2016-08-08 NOTE — Progress Notes (Signed)
Anticoagulation Management Brian Lloyd is a 56 y.o. male who reports to the clinic for monitoring of warfarin treatment.    Indication: CVA ; antiphospholipid antibody syndrome, chronic DVT, long term anticoagulant use.  Duration: indefinite Supervising physician: Cephas DarbyJames Granfortuna  Anticoagulation Clinic Visit History: Patient does not report signs/symptoms of bleeding or thromboembolism  Other recent changes:  No diet, medications, lifestyle changes--except more outside/in-sun. Anticoagulation Episode Summary    Current INR goal:   2.5-3.5  TTR:   69.0 % (4.5 mo)  Next INR check:   08/29/2016  INR from last check:   4.70! 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) [I63.119] History of CVA (cerebrovascular accident) without residual deficits [Z86.73] S/P aortic valve replacement with bioprosthetic valve [Z95.3]       Comments:   Patient commenced attending this clinic on 11/12/2015       ASSESSMENT Recent Results: The most recent result is correlated with 52.5 mg per week: Lab Results  Component Value Date   INR 4.70 08/08/2016   INR 2.10 07/04/2016   INR 3.40 06/13/2016    Anticoagulation Dosing: INR as of 08/08/2016 and Previous Warfarin Dosing Information    INR Dt INR Goal Wkly Tot Sun Mon Tue Wed Thu Fri Sat   08/08/2016 4.70 2.5-3.5 52.5 mg 7.5 mg 7.5 mg 7.5 mg 7.5 mg 7.5 mg 7.5 mg 7.5 mg    Previous description   Take 1 & 1/2 tablets of your 5mg  peach-colored warfarin tablets by mouth, once-daily, at [facility]6PM each day.    Anticoagulation Warfarin Dose Instructions as of 08/08/2016    Total Sun Mon Tue Wed Thu Fri Sat   New Dose 47.5 mg 7.5 mg 5 mg 7.5 mg 7.5 mg 5 mg 7.5 mg 7.5 mg     (5 mg x 1.5)  (5 mg x 1)  (5 mg x 1.5)  (5 mg x 1.5)  (5 mg x 1)  (5 mg x 1.5)  (5 mg x 1.5)                         Description   Take 1 & 1/2 tablets of your 5mg  peach-colored warfarin tablets by mouth, once-daily, at 6PM each day all days of week--EXCEPT on Mondays and Thursdays--take ONLY ONE (1) tablet on Mondays and Thursdays.      INR today: Supratherapeutic  PLAN Weekly dose was decreased by 9% to 47.5 mg per week  Patient Instructions  Patient instructed to take medications as defined in the Anti-coagulation Track section of this encounter.  Patient instructed to take today's dose.  Patient instructed to take  1 & 1/2 tablets of your 5mg  peach-colored warfarin tablets by mouth, once-daily, at 6PM each day all days of week--EXCEPT on Mondays and Thursdays--take ONLY ONE (1) tablet on Mondays and Thursdays.  Patient verbalized understanding of these instructions.     Patient advised to contact clinic or seek medical attention if signs/symptoms of bleeding or thromboembolism occur.  Patient verbalized understanding by repeating back information and was advised to contact me if further medication-related  questions arise. Patient was also provided an information handout.  Follow-up Return in 3 weeks (on 08/29/2016) for Follow up INR at 1015h.  Gar Ponto PharmD, CACP, CPP  15 minutes spent face-to-face with the patient during the encounter. 50% of time spent on education. 50% of time was spent on point of care finger-stick INR sample collection, processing,  interpretation, .aand data entry into CHL/EPIC and [email or web address]

## 2016-08-08 NOTE — Progress Notes (Signed)
Reviewed with you & patient Thanks drG

## 2016-08-09 DIAGNOSIS — M47812 Spondylosis without myelopathy or radiculopathy, cervical region: Secondary | ICD-10-CM | POA: Diagnosis not present

## 2016-08-29 ENCOUNTER — Ambulatory Visit (INDEPENDENT_AMBULATORY_CARE_PROVIDER_SITE_OTHER): Payer: BLUE CROSS/BLUE SHIELD | Admitting: Pharmacist

## 2016-08-29 DIAGNOSIS — D6861 Antiphospholipid syndrome: Secondary | ICD-10-CM | POA: Diagnosis not present

## 2016-08-29 DIAGNOSIS — Z7901 Long term (current) use of anticoagulants: Secondary | ICD-10-CM | POA: Diagnosis not present

## 2016-08-29 DIAGNOSIS — Z952 Presence of prosthetic heart valve: Secondary | ICD-10-CM

## 2016-08-29 DIAGNOSIS — I825Z2 Chronic embolism and thrombosis of unspecified deep veins of left distal lower extremity: Secondary | ICD-10-CM | POA: Diagnosis not present

## 2016-08-29 DIAGNOSIS — Z8673 Personal history of transient ischemic attack (TIA), and cerebral infarction without residual deficits: Secondary | ICD-10-CM | POA: Diagnosis not present

## 2016-08-29 DIAGNOSIS — I639 Cerebral infarction, unspecified: Secondary | ICD-10-CM

## 2016-08-29 DIAGNOSIS — I63119 Cerebral infarction due to embolism of unspecified vertebral artery: Secondary | ICD-10-CM

## 2016-08-29 DIAGNOSIS — Z953 Presence of xenogenic heart valve: Secondary | ICD-10-CM

## 2016-08-29 LAB — POCT INR: INR: 3.3

## 2016-08-29 NOTE — Progress Notes (Signed)
Anticoagulation Management Brian Lloyd is a 56 y.o. male who reports to the clinic for monitoring of warfarin treatment.    Indication: atrial fibrillation  Duration: indefinite Supervising physician: Cephas Darby  Anticoagulation Clinic Visit History: Patient does not report signs/symptoms of bleeding or thromboembolism  Other recent changes: No diet, medications, lifestyle changes endorsed by the patient.  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 Naproxen Anaphylaxis   Prior to Admission medications   Medication Sig Start Date End Date Taking? Authorizing Provider  aspirin EC 81 MG tablet Take 1 tablet (81 mg total) by mouth daily. Patient states he has taken aspirin in the past without getting a reaction 11/04/15 11/03/16 Yes Levert Feinstein, MD  warfarin (COUMADIN) 5 MG tablet Take 1.5 tablets (7.5 mg total) by mouth daily at 6 PM. 06/10/16  Yes Granfortuna, Genene Churn, MD  acetaminophen (TYLENOL) 500 MG tablet Take 1,000 mg by mouth every 6 (six) hours as needed for mild pain or headache.    [provider]  divalproex (DEPAKOTE ER) 500 MG 24 hr tablet Take 1 tablet (500  mg total) by mouth daily. Patient not taking: Reported on 07/04/2016 04/28/16   Micki Riley, MD  eletriptan (RELPAX) 40 MG tablet Take 1 tablet (40 mg total) by mouth as needed for migraine or headache. May repeat in 2 hours if headache persists or recurs. Patient not taking: Reported on 07/04/2016 04/28/16   Micki Riley, MD  psyllium (METAMUCIL) 58.6 % packet Take 1 packet by mouth daily.    [provider]  Tetrahydrozoline HCl (VISINE OP) Apply 1 drop to eye daily as needed (dry eyes).    [provider]   Past Medical History:  Diagnosis Date  . Acute cerebral infarction (HCC) 11/18/2015   Bifrontal embolic infarcts MRI/MRA 11/18/15; known aortic valve vegetation on lovenox/coumadin/ASA  . Antiphospholipid antibody syndrome (HCC) 11/06/2015   11/04/15 significant elevation of IgG anticardiolipin & beta-2-GP-1 antibodies  . 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 Aortic valve mass   . 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 DVT (deep venous thrombosis) (HCC)    left leg, on Xarelto  . 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 S/P aortic valve replacement with bioprosthetic valve 11/24/2015   25 mm [facility] Ease bovine pericardial tissue valve  . Thrombocytopenia [facility])    Social History   Social  History  . Marital status: Married    Spouse name: N/A  . Number of children: N/A  . Years of education: N/A   Social History Main Topics  . Smoking status: Never Smoker  . Smokeless tobacco: Never Used  . Alcohol use 1.8 oz/week    1 Glasses of wine, 1 Cans of beer, 1 Shots of liquor per week     Comment: rarely  . Drug use: No  . Sexual activity: Yes   Other Topics Concern  . Not on file   Social History Narrative  . No narrative on  file   Family History  Problem Relation Age of Onset  . Heart disease Father   . Hypertension Father   . Heart disease Maternal Grandfather   . Hypertension Maternal Grandfather   . Diabetes Paternal Grandfather   . Heart disease Paternal Grandfather     ASSESSMENT Recent Results: The most recent result is correlated with 47.5 mg per week: Lab Results  Component Value Date   INR 3.30 08/29/2016   INR 4.70 08/08/2016   INR 2.10 07/04/2016    Anticoagulation Dosing: INR as of 08/29/2016 and Previous Warfarin Dosing Information    INR Dt INR Goal Wkly Tot Sun Mon Tue Wed Thu Fri Sat   08/29/2016 3.30 2.5-3.5 47.5 mg 7.5 mg 5 mg 7.5 mg 7.5 mg 5 mg 7.5 mg 7.5 mg    Previous description   Take 1 & 1/2 tablets of your 5mg  peach-colored warfarin tablets by mouth, once-daily, at 6PM each day all days of week--EXCEPT on Mondays and Thursdays--take ONLY ONE (1) tablet on Mondays and Thursdays.    Anticoagulation Warfarin Dose Instructions as of 08/29/2016      Total Sun Mon Tue Wed Thu Fri Sat   New Dose 47.5 mg 7.5 mg 5 mg 7.5 mg 7.5 mg 5 mg 7.5 mg 7.5 mg     (5 mg x 1.5)  (5 mg x 1)  (5 mg x 1.5)  (5 mg x 1.5)  (5 mg x 1)  (5 mg x 1.5)  (5 mg x 1.5)                         Description   Take 1 & 1/2 tablets of your 5mg  peach-colored warfarin tablets by mouth, once-daily, at 6PM each day all days of week--EXCEPT on Mondays and Thursdays--take ONLY ONE (1) tablet on Mondays and Thursdays.      INR today: Therapeutic  PLAN Weekly dose was unchanged.  Patient Instructions  Patient instructed to take medications as defined in the Anti-coagulation Track section of this encounter.  Patient instructed to take today's dose.  Patient instructed to take 1 & 1/2 tablets of your 5mg  peach-colored warfarin tablets by mouth, once-daily, at 6PM each day all days of week--EXCEPT on Mondays and Thursdays--take ONLY ONE (1) tablet on Mondays and Thursdays Patient verbalized understanding of  these instructions.     Patient advised to contact clinic or seek medical attention if signs/symptoms of bleeding or thromboembolism occur.  Patient verbalized understanding by repeating back information and was advised to contact me if further medication-related questions arise. Patient was also provided an information handout.  Follow-up Return in 4 weeks (on 09/26/2016) for Follow up INR at 1015h.  Gar PontoGroce III, Onda Kattner Boyd PharmD, CACP, CPP  15 minutes spent face-to-face with the patient during the encounter. 50% of time spent on education. 50% of time was spent on fingerstick, point  of care INR sample collection, processing, results interpretation and data entry in to EPIC/CHL and [email or web address].

## 2016-08-29 NOTE — Patient Instructions (Signed)
Patient instructed to take medications as defined in the Anti-coagulation Track section of this encounter.  Patient instructed to take today's dose.  Patient instructed to take 1 & 1/2 tablets of your 5mg  peach-colored warfarin tablets by mouth, once-daily, at 6PM each day all days of week--EXCEPT on Mondays and Thursdays--take ONLY ONE (1) tablet on Mondays and Thursdays Patient verbalized understanding of these instructions.

## 2016-08-30 NOTE — Progress Notes (Signed)
Reviewed thx DrG 

## 2016-09-26 ENCOUNTER — Ambulatory Visit (INDEPENDENT_AMBULATORY_CARE_PROVIDER_SITE_OTHER): Payer: BLUE CROSS/BLUE SHIELD | Admitting: Pharmacist

## 2016-09-26 DIAGNOSIS — I63119 Cerebral infarction due to embolism of unspecified vertebral artery: Secondary | ICD-10-CM

## 2016-09-26 DIAGNOSIS — Z8673 Personal history of transient ischemic attack (TIA), and cerebral infarction without residual deficits: Secondary | ICD-10-CM | POA: Diagnosis not present

## 2016-09-26 DIAGNOSIS — Z7901 Long term (current) use of anticoagulants: Secondary | ICD-10-CM

## 2016-09-26 DIAGNOSIS — Z953 Presence of xenogenic heart valve: Secondary | ICD-10-CM | POA: Diagnosis not present

## 2016-09-26 DIAGNOSIS — D6861 Antiphospholipid syndrome: Secondary | ICD-10-CM

## 2016-09-26 DIAGNOSIS — I825Z2 Chronic embolism and thrombosis of unspecified deep veins of left distal lower extremity: Secondary | ICD-10-CM | POA: Diagnosis not present

## 2016-09-26 DIAGNOSIS — I639 Cerebral infarction, unspecified: Secondary | ICD-10-CM

## 2016-09-26 LAB — POCT INR: INR: 2.8

## 2016-09-26 NOTE — Progress Notes (Signed)
Anticoagulation Management Brian Lloyd is a 56 y.o. male who reports to the clinic for monitoring of warfarin treatment.    Indication: CVA, history of; antiphospholipid antibody syndrome; chronic deep vein thrombosis of distal lower left extremity; continued chronic oral anticoagulation with warfarin.  Duration: indefinite Supervising physician: Cephas Darby  Anticoagulation Clinic Visit History: Patient does not report signs/symptoms of bleeding or thromboembolism  Other recent changes: No diet, medications, lifestyle endorsed by the patient to me.  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 Naproxen Anaphylaxis   Prior to Admission medications   Medication Sig Start Date End Date Taking? Authorizing Provider  acetaminophen (TYLENOL) 500 MG tablet Take 1,000 mg by mouth every 6 (six) hours as needed for mild pain or headache.   Yes [provider]  aspirin EC 81 MG tablet Take 1 tablet (81 mg total) by mouth daily. Patient states he has taken aspirin in the past without getting a reaction 11/04/15 11/03/16 Yes Levert Feinstein, MD  divalproex (DEPAKOTE ER) 500 MG 24 hr tablet  Take 1 tablet (500 mg total) by mouth daily. 04/28/16  Yes Micki Riley, MD  eletriptan (RELPAX) 40 MG tablet Take 1 tablet (40 mg total) by mouth as needed for migraine or headache. May repeat in 2 hours if headache persists or recurs. 04/28/16  Yes Micki Riley, MD  psyllium (METAMUCIL) 58.6 % packet Take 1 packet by mouth daily.   Yes [provider]  Tetrahydrozoline HCl (VISINE OP) Apply 1 drop to eye daily as needed (dry eyes).   Yes [provider]  warfarin (COUMADIN) 5 MG tablet Take 1.5 tablets (7.5 mg total) by mouth daily at 6 PM. 06/10/16  Yes Granfortuna, Genene Churn, MD   Past Medical History:  Diagnosis Date  . Acute cerebral infarction (HCC) 11/18/2015   Bifrontal embolic infarcts MRI/MRA 11/18/15; known aortic valve vegetation on lovenox/coumadin/ASA  . Antiphospholipid antibody syndrome (HCC) 11/06/2015   11/04/15 significant elevation of IgG anticardiolipin & beta-2-GP-1 antibodies  . 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 Aortic valve mass   . 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 DVT (deep venous thrombosis) (HCC)    left leg, on Xarelto  . 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 S/P aortic valve replacement with bioprosthetic valve 11/24/2015   25 mm [facility] Ease bovine pericardial tissue valve  . Thrombocytopenia (HCC)  Social History   Social History  . Marital status: Married    Spouse name: N/A  . Number of children: N/A  . Years of education: N/A   Social History Main Topics  . Smoking status: Never Smoker  . Smokeless tobacco: Never Used  . Alcohol use 1.8 oz/week    1 Glasses of wine, 1 Cans of beer, 1 Shots of liquor per week     Comment: rarely  . Drug use: No  . Sexual activity: Yes   Other  Topics Concern  . Not on file   Social History Narrative  . No narrative on file   Family History  Problem Relation Age of Onset  . Heart disease Father   . Hypertension Father   . Heart disease Maternal Grandfather   . Hypertension Maternal Grandfather   . Diabetes Paternal Grandfather   . Heart disease Paternal Grandfather     ASSESSMENT Recent Results: The most recent result is correlated with 47.5 mg per week: Lab Results  Component Value Date   INR 2.80 09/26/2016   INR 3.30 08/29/2016   INR 4.70 08/08/2016    Anticoagulation Dosing: INR as of 09/26/2016 and Previous Warfarin Dosing Information    INR Dt INR Goal Wkly Tot Sun Mon Tue Wed Thu Fri Sat   09/26/2016 2.80 2.5-3.5 47.5 mg 7.5 mg 5 mg 7.5 mg 7.5 mg 5 mg 7.5 mg 7.5 mg    Previous description   Take 1 & 1/2 tablets of your 5mg  peach-colored warfarin tablets by mouth, once-daily, at 6PM each day all days of week--EXCEPT on Mondays and Thursdays--take ONLY ONE (1) tablet on Mondays and Thursdays.    Anticoagulation Warfarin Dose Instructions as of 09/26/2016      Total Sun Mon Tue Wed Thu Fri Sat   New Dose 47.5 mg 7.5 mg 5 mg 7.5 mg 7.5 mg 5 mg 7.5 mg 7.5 mg     (5 mg x 1.5)  (5 mg x 1)  (5 mg x 1.5)  (5 mg x 1.5)  (5 mg x 1)  (5 mg x 1.5)  (5 mg x 1.5)                         Description   Take 1 & 1/2 tablets of your 5mg  peach-colored warfarin tablets by mouth, once-daily, at 6PM each day all days of week--EXCEPT on Mondays and Thursdays--take ONLY ONE (1) tablet on Mondays and Thursdays.      INR today: Therapeutic  PLAN Weekly dose was unchanged   Patient Instructions  Patient instructed to take medications as defined in the Anti-coagulation Track section of this encounter.  Patient instructed to take today's dose.  Patient instructed to take  1 & 1/2 tablets of your 5mg  peach-colored warfarin tablets by mouth, once-daily, at 6PM each day all days of week--EXCEPT on Mondays and Thursdays--take  ONLY ONE (1) tablet on Mondays and Thursdays.  Patient verbalized understanding of these instructions.     Patient advised to contact clinic or seek medical attention if signs/symptoms of bleeding or thromboembolism occur.  Patient verbalized understanding by repeating back information and was advised to contact me if further medication-related questions arise. Patient was also provided an information handout.  Follow-up Return in 3 weeks (on 10/17/2016) for Follow up INR at 1045h.  Gar Ponto PharmD, CACP, CPP  15 minutes spent face-to-face with the patient during the encounter. 50% of time spent on education.  50% of time was spent on fingerstick point of care INR sample collection, processing, results interpretation and documentation in to EPIC/CHL and [email or web address].

## 2016-09-26 NOTE — Patient Instructions (Signed)
Patient instructed to take medications as defined in the Anti-coagulation Track section of this encounter.  Patient instructed to take today's dose.  Patient instructed to take  1 & 1/2 tablets of your 5mg  peach-colored warfarin tablets by mouth, once-daily, at 6PM each day all days of week--EXCEPT on Mondays and Thursdays--take ONLY ONE (1) tablet on Mondays and Thursdays.  Patient verbalized understanding of these instructions.

## 2016-09-27 NOTE — Progress Notes (Signed)
Reviewed thx DrG 

## 2016-09-30 NOTE — Progress Notes (Signed)
Reviewed thx DrG 

## 2016-10-17 ENCOUNTER — Ambulatory Visit (INDEPENDENT_AMBULATORY_CARE_PROVIDER_SITE_OTHER): Payer: BLUE CROSS/BLUE SHIELD | Admitting: Pharmacist

## 2016-10-17 DIAGNOSIS — D6861 Antiphospholipid syndrome: Secondary | ICD-10-CM | POA: Diagnosis not present

## 2016-10-17 DIAGNOSIS — Z953 Presence of xenogenic heart valve: Secondary | ICD-10-CM

## 2016-10-17 DIAGNOSIS — Z8673 Personal history of transient ischemic attack (TIA), and cerebral infarction without residual deficits: Secondary | ICD-10-CM

## 2016-10-17 DIAGNOSIS — Z7901 Long term (current) use of anticoagulants: Secondary | ICD-10-CM | POA: Diagnosis not present

## 2016-10-17 DIAGNOSIS — I825Z2 Chronic embolism and thrombosis of unspecified deep veins of left distal lower extremity: Secondary | ICD-10-CM | POA: Diagnosis not present

## 2016-10-17 DIAGNOSIS — I63119 Cerebral infarction due to embolism of unspecified vertebral artery: Secondary | ICD-10-CM

## 2016-10-17 DIAGNOSIS — I639 Cerebral infarction, unspecified: Secondary | ICD-10-CM

## 2016-10-17 LAB — POCT INR: INR: 2.9

## 2016-10-17 NOTE — Patient Instructions (Signed)
Patient instructed to take medications as defined in the Anti-coagulation Track section of this encounter.  Patient instructed to take today's dose.  Patient instructed to take 1 & 1/2 tablets of your 5mg peach-colored warfarin tablets by mouth, once-daily, at 6PM each day all days of week--EXCEPT on Mondays and Thursdays--take ONLY ONE (1) tablet on Mondays and Thursdays Patient verbalized understanding of these instructions.    

## 2016-10-17 NOTE — Progress Notes (Signed)
Reviewed thx DrG 

## 2016-10-17 NOTE — Progress Notes (Signed)
Anticoagulation Management Brian Lloyd is a 56 y.o. male who reports to the clinic for monitoring of warfarin treatment.    Indication: CVA, APLABS, Chronic DVT, long-term anticoagulation (current) for continued secondary prophylaxis against VTE and arterial embolic events.  Duration: indefinite Supervising physician: Cephas Darby  Anticoagulation Clinic Visit History: Patient does report signs/symptoms of bleeding or thromboembolism Other recent changes: No diet, medications, lifestyle endorsed by the patient to me.  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 Naproxen Anaphylaxis   Prior to Admission medications   Medication Sig Start Date End Date Taking? Authorizing Provider  acetaminophen (TYLENOL) 500 MG tablet Take 1,000 mg by mouth every 6 (six) hours as needed for mild pain or headache.   Yes [provider]  aspirin EC 81 MG tablet Take 1 tablet (81 mg total) by mouth daily. Patient states he has taken aspirin in the past without getting a reaction 11/04/15 11/03/16 Yes Levert Feinstein, MD  divalproex (DEPAKOTE ER) 500 MG 24 hr tablet Take 1 tablet (500 mg total) by  mouth daily. 04/28/16  Yes Micki Riley, MD  eletriptan (RELPAX) 40 MG tablet Take 1 tablet (40 mg total) by mouth as needed for migraine or headache. May repeat in 2 hours if headache persists or recurs. 04/28/16  Yes Micki Riley, MD  psyllium (METAMUCIL) 58.6 % packet Take 1 packet by mouth daily.   Yes [provider]  Tetrahydrozoline HCl (VISINE OP) Apply 1 drop to eye daily as needed (dry eyes).   Yes [provider]  warfarin (COUMADIN) 5 MG tablet Take 1.5 tablets (7.5 mg total) by mouth daily at 6 PM. 06/10/16  Yes Granfortuna, Genene Churn, MD   Past Medical History:  Diagnosis Date  . Acute cerebral infarction (HCC) 11/18/2015   Bifrontal embolic infarcts MRI/MRA 11/18/15; known aortic valve vegetation on lovenox/coumadin/ASA  . Antiphospholipid antibody syndrome (HCC) 11/06/2015   11/04/15 significant elevation of IgG anticardiolipin & beta-2-GP-1 antibodies  . 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 Aortic valve mass   . 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 DVT (deep venous thrombosis) (HCC)    left leg, on Xarelto  . 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 S/P aortic valve replacement with bioprosthetic valve 11/24/2015   25 mm [facility] Ease bovine pericardial tissue valve  . Thrombocytopenia [facility])    Social History  Social History  . Marital status: Married    Spouse name: N/A  . Number of children: N/A  . Years of education: N/A   Social History Main Topics  . Smoking status: Never Smoker  . Smokeless tobacco: Never Used  . Alcohol use 1.8 oz/week    1 Glasses of wine, 1 Cans of beer, 1 Shots of liquor per week     Comment: rarely  . Drug use: No  . Sexual activity: Yes   Other Topics Concern  . Not on file    Social History Narrative  . No narrative on file   Family History  Problem Relation Age of Onset  . Heart disease Father   . Hypertension Father   . Heart disease Maternal Grandfather   . Hypertension Maternal Grandfather   . Diabetes Paternal Grandfather   . Heart disease Paternal Grandfather     ASSESSMENT Recent Results: The most recent result is correlated with 47.5 mg per week: Lab Results  Component Value Date   INR 2.90 10/17/2016   INR 2.80 09/26/2016   INR 3.30 08/29/2016    Anticoagulation Dosing: INR as of 10/17/2016 and Previous Warfarin Dosing Information    INR Dt INR Goal [facility] Sun Mon Tue Wed Thu Fri Sat   10/17/2016 2.90 2.5-3.5 47.5 mg 7.5 mg 5 mg 7.5 mg 7.5 mg 5 mg 7.5 mg 7.5 mg    Previous description   Take 1 & 1/2 tablets of your  peach-colored warfarin tablets by mouth, once-daily, at 6PM each day all days of week--EXCEPT on Mondays and Thursdays--take ONLY ONE (1) tablet on Mondays and Thursdays.    Anticoagulation Warfarin Dose Instructions as of 10/17/2016      Total Sun Mon Tue Wed Thu Fri Sat   New Dose 47.5 mg 7.5 mg 5 mg 7.5 mg 7.5 mg 5 mg 7.5 mg 7.5 mg     (5 mg x 1.5)  (5 mg x 1)  (5 mg x 1.5)  (5 mg x 1.5)  (5 mg x 1)  (5 mg x 1.5)  (5 mg x 1.5)                         Description   Take 1 & 1/2 tablets of your  peach-colored warfarin tablets by mouth, once-daily, at 6PM each day all days of week--EXCEPT on Mondays and Thursdays--take ONLY ONE (1) tablet on Mondays and Thursdays.      INR today: Therapeutic  PLAN Weekly dose was unchanged.  Patient Instructions  Patient instructed to take medications as defined in the Anti-coagulation Track section of this encounter.  Patient instructed to take today's dose.  Patient instructed to take 1 & 1/2 tablets of your  peach-colored warfarin tablets by mouth, once-daily, at 6PM each day all days of week--EXCEPT on Mondays and Thursdays--take ONLY ONE (1) tablet on Mondays  and Thursdays.  Patient verbalized understanding of these instructions.     Patient advised to contact clinic or seek medical attention if signs/symptoms of bleeding or thromboembolism occur.  Patient verbalized understanding by repeating back information and was advised to contact me if further medication-related questions arise. Patient was also provided an information handout.  Follow-up Return in 4 weeks (on 11/14/2016) for Follow up INR at 1100h.  Elicia Lamp, PharmD, CACP, CPP  15 minutes spent face-to-face with the patient during the encounter. 50% of time spent on education. 50% of time was spent on point  of care fingerstick INR sample collection, processing, results determination, and documentation in [email or web address].

## 2016-10-18 ENCOUNTER — Telehealth: Payer: Self-pay | Admitting: Pharmacist

## 2016-11-14 ENCOUNTER — Ambulatory Visit (INDEPENDENT_AMBULATORY_CARE_PROVIDER_SITE_OTHER): Payer: BLUE CROSS/BLUE SHIELD | Admitting: Pharmacist

## 2016-11-14 DIAGNOSIS — I825Z2 Chronic embolism and thrombosis of unspecified deep veins of left distal lower extremity: Secondary | ICD-10-CM | POA: Diagnosis not present

## 2016-11-14 DIAGNOSIS — Z7901 Long term (current) use of anticoagulants: Secondary | ICD-10-CM | POA: Diagnosis not present

## 2016-11-14 DIAGNOSIS — I639 Cerebral infarction, unspecified: Secondary | ICD-10-CM

## 2016-11-14 DIAGNOSIS — D6861 Antiphospholipid syndrome: Secondary | ICD-10-CM

## 2016-11-14 DIAGNOSIS — Z8673 Personal history of transient ischemic attack (TIA), and cerebral infarction without residual deficits: Secondary | ICD-10-CM

## 2016-11-14 DIAGNOSIS — I63119 Cerebral infarction due to embolism of unspecified vertebral artery: Secondary | ICD-10-CM

## 2016-11-14 DIAGNOSIS — Z953 Presence of xenogenic heart valve: Secondary | ICD-10-CM | POA: Diagnosis not present

## 2016-11-14 LAB — POCT INR: INR: 2.4

## 2016-11-14 NOTE — Progress Notes (Signed)
Reviewed thx DrG 

## 2016-11-14 NOTE — Progress Notes (Signed)
Anticoagulation Management Brian Lloyd is a 56 y.o. male who reports to the clinic for monitoring of warfarin treatment.    Indication: CVA , history of; Antiphospholipid antibody syndrome (HCC) [68.61], chronic deep vein thrombosis (DVT) of distal vein of left lower extremity; long term anticoagulation.   Duration: indefinite Supervising physician: Cephas Darby  Anticoagulation Clinic Visit History: Patient does not report signs/symptoms of bleeding or thromboembolism  Other recent changes: No diet, medications, lifestyle changes endorsed by the patient to me.  Anticoagulation Episode Summary    Current INR goal:   2.5-3.5  TTR:   71.9 % (7.8 mo)  Next INR check:   12/12/2016  INR from last check:   2.40! 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 Naproxen Anaphylaxis   Prior to Admission medications   Medication Sig Start Date End Date Taking? Authorizing Provider  acetaminophen (TYLENOL) 500 MG tablet Take 1,000 mg by mouth every 6 (six) hours as needed for mild pain or headache.   Yes [provider]  divalproex (DEPAKOTE ER) 500 MG 24 hr tablet Take 1 tablet (500 mg total) by mouth daily. 04/28/16  Yes Micki Riley, MD  eletriptan (RELPAX) 40 MG tablet Take 1 tablet (40 mg total) by mouth as needed for migraine  or headache. May repeat in 2 hours if headache persists or recurs. 04/28/16  Yes Micki Riley, MD  psyllium (METAMUCIL) 58.6 % packet Take 1 packet by mouth daily.   Yes [provider]  Tetrahydrozoline HCl (VISINE OP) Apply 1 drop to eye daily as needed (dry eyes).   Yes [provider]  warfarin (COUMADIN) 5 MG tablet Take 1.5 tablets (7.5 mg total) by mouth daily at 6 PM. 06/10/16  Yes Granfortuna, Genene Churn, MD   Past Medical History:  Diagnosis Date  . Acute cerebral infarction (HCC) 11/18/2015   Bifrontal embolic infarcts MRI/MRA 11/18/15; known aortic valve vegetation on lovenox/coumadin/ASA  . Antiphospholipid antibody syndrome (HCC) 11/06/2015   11/04/15 significant elevation of IgG anticardiolipin & beta-2-GP-1 antibodies  . 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 Aortic valve mass   . 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 DVT (deep venous thrombosis) (HCC)    left leg, on Xarelto  . 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 S/P aortic valve replacement with bioprosthetic valve 11/24/2015   25 mm [facility] Ease bovine pericardial tissue valve  . Thrombocytopenia [facility])    Social History   Social History  . Marital status: Married    Spouse name: N/A  . Number of children: N/A  . Years of  education: N/A   Social History Main Topics  . Smoking status: Never Smoker  . Smokeless tobacco: Never Used  . Alcohol use 1.8 oz/week    1 Glasses of wine, 1 Cans of beer, 1 Shots of liquor per week     Comment: rarely  . Drug use: No  . Sexual activity: Yes   Other Topics Concern  . Not on file   Social History Narrative  . No narrative on file   Family History  Problem Relation Age of Onset  . Heart disease Father   .  Hypertension Father   . Heart disease Maternal Grandfather   . Hypertension Maternal Grandfather   . Diabetes Paternal Grandfather   . Heart disease Paternal Grandfather     ASSESSMENT Recent Results: The most recent result is correlated with 47.5 mg per week: Lab Results  Component Value Date   INR 2.40 11/14/2016   INR 2.90 10/17/2016   INR 2.80 09/26/2016    Anticoagulation Dosing: INR as of 11/14/2016 and Previous Warfarin Dosing Information    INR Dt INR Goal [facility] Sun Mon Tue Wed Thu Fri Sat   11/14/2016 2.40 2.5-3.5 47.5 mg 7.5 mg 5 mg 7.5 mg 7.5 mg 5 mg 7.5 mg 7.5 mg    Previous description   Take 1 & 1/2 tablets of your  peach-colored warfarin tablets by mouth, once-daily, at 6PM each day all days of week--EXCEPT on Mondays and Thursdays--take ONLY ONE (1) tablet on Mondays and Thursdays.    Anticoagulation Warfarin Dose Instructions as of 11/14/2016      Total Sun Mon Tue Wed Thu Fri Sat   New Dose 47.5 mg 7.5 mg 5 mg 7.5 mg 7.5 mg 5 mg 7.5 mg 7.5 mg     (5 mg x 1.5)  (5 mg x 1)  (5 mg x 1.5)  (5 mg x 1.5)  (5 mg x 1)  (5 mg x 1.5)  (5 mg x 1.5)                         Description   Take 1 & 1/2 tablets of your  peach-colored warfarin tablets by mouth, once-daily, at 6PM each day all days of week--EXCEPT on Mondays and Thursdays--take ONLY ONE (1) tablet on Mondays and Thursdays.      INR today: Therapeutic  PLAN Weekly dose was unchanged .  Patient Instructions  Patient instructed to take medications as defined in the Anti-coagulation Track section of this encounter.  Patient instructed to take today's dose.  Patient instructed to take 1 & 1/2 tablets of your  peach-colored warfarin tablets by mouth, once-daily, at 6PM each day all days of week--EXCEPT on Mondays and Thursdays--take ONLY ONE (1) tablet on Mondays and Thursdays.  Patient verbalized understanding of these instructions.     Patient advised to contact clinic or seek medical  attention if signs/symptoms of bleeding or thromboembolism occur.  Patient verbalized understanding by repeating back information and was advised to contact me if further medication-related questions arise. Patient was also provided an information handout.  Follow-up Return in 4 weeks (on 12/12/2016) for Follow up  INR at 1100h.  Elicia Lamp, PharmD, CACP, CPP  15 minutes spent face-to-face with the patient during the encounter. 50% of time spent on education. 50% of time was spent on fingerstick point of care INR sample collection, processing, results determination and documentation in EPIC/CH[facility] and [email or web address].

## 2016-11-14 NOTE — Patient Instructions (Signed)
Patient instructed to take medications as defined in the Anti-coagulation Track section of this encounter.  Patient instructed to take today's dose.  Patient instructed to take 1 & 1/2 tablets of your 5mg peach-colored warfarin tablets by mouth, once-daily, at 6PM each day all days of week--EXCEPT on Mondays and Thursdays--take ONLY ONE (1) tablet on Mondays and Thursdays Patient verbalized understanding of these instructions.    

## 2016-11-16 DIAGNOSIS — L989 Disorder of the skin and subcutaneous tissue, unspecified: Secondary | ICD-10-CM | POA: Diagnosis not present

## 2016-11-16 DIAGNOSIS — K429 Umbilical hernia without obstruction or gangrene: Secondary | ICD-10-CM | POA: Diagnosis not present

## 2016-11-16 DIAGNOSIS — M47812 Spondylosis without myelopathy or radiculopathy, cervical region: Secondary | ICD-10-CM | POA: Diagnosis not present

## 2016-11-21 NOTE — Telephone Encounter (Signed)
See documentation in notes

## 2016-11-28 ENCOUNTER — Encounter: Payer: Self-pay | Admitting: Thoracic Surgery (Cardiothoracic Vascular Surgery)

## 2016-11-28 ENCOUNTER — Ambulatory Visit (INDEPENDENT_AMBULATORY_CARE_PROVIDER_SITE_OTHER): Payer: BLUE CROSS/BLUE SHIELD | Admitting: Thoracic Surgery (Cardiothoracic Vascular Surgery)

## 2016-11-28 VITALS — BP 124/81 | HR 71 | Resp 16 | Ht 69.0 in | Wt 191.0 lb

## 2016-11-28 DIAGNOSIS — Z952 Presence of prosthetic heart valve: Secondary | ICD-10-CM | POA: Diagnosis not present

## 2016-11-28 DIAGNOSIS — I358 Other nonrheumatic aortic valve disorders: Secondary | ICD-10-CM

## 2016-11-28 DIAGNOSIS — Z951 Presence of aortocoronary bypass graft: Secondary | ICD-10-CM | POA: Diagnosis not present

## 2016-11-28 DIAGNOSIS — I359 Nonrheumatic aortic valve disorder, unspecified: Secondary | ICD-10-CM | POA: Diagnosis not present

## 2016-11-28 DIAGNOSIS — M3211 Endocarditis in systemic lupus erythematosus: Secondary | ICD-10-CM

## 2016-11-28 DIAGNOSIS — Z953 Presence of xenogenic heart valve: Secondary | ICD-10-CM

## 2016-11-28 NOTE — Patient Instructions (Signed)
You may resume unrestricted physical activity without any particular limitations at this time.  Endocarditis is a potentially serious infection of heart valves or inside lining of the heart.  It occurs more commonly in patients with diseased heart valves (such as patient's with aortic or mitral valve disease) and in patients who have undergone heart valve repair or replacement.  Certain surgical and dental procedures may put you at risk, such as dental cleaning, other dental procedures, or any surgery involving the respiratory, urinary, gastrointestinal tract, gallbladder or prostate gland.   To minimize your chances for develooping endocarditis, maintain good oral health and seek prompt medical attention for any infections involving the mouth, teeth, gums, skin or urinary tract.    Always notify your doctor or dentist about your underlying heart valve condition before having any invasive procedures. You will need to take antibiotics before certain procedures, including all routine dental cleanings or other dental procedures.  Your cardiologist or dentist should prescribe these antibiotics for you to be taken ahead of time.      

## 2016-11-28 NOTE — Progress Notes (Signed)
[address]       [place] [address]             [phone]     CARDIOTHORACIC SURGERY OFFICE NOTE  Referring Provider is Marykay Lex, MD PCP is Lovenia Kim, PA-C   HPI:  Patient is a 56 year old male with history of antiphospholipid antibody syndrome and Shirlean Schlein endocarditis who returns to the office for routine follow-up status post excision of aortic valve mass, aortic valve replacement using a bioprosthetic tissue valve, and coronary artery bypass grafting 2 on 11/24/2015.  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 Cultures were negative. Attempts to repair the valve were unsuccessful and the valve was replaced using a bovine bioprosthetic tissue valve. 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 Because of this the patient underwent coronary artery bypass grafting 2 using a reversed saphenous vein grafts to both the left anterior descending coronary artery and the obtuse marginal branch of left circumflex coronary artery.  His postoperative recovery was uneventful. He was last seen here in our office on 12/28/2015 at which time he was doing fairly well. He has been followed intermittently ever since by Dr. Cyndie Chime who has been managing anticoagulation using warfarin. The patient had problems intermittently with episodes of blurry vision. He was referred to Dr. 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 The patient returns to our office today and reports that he is doing exceptionally well. He states that he feels much better than he did prior to surgery. He no longer has any of the symptoms that he had  prior to undergoing valve replacement. He is quite active physically and he specifically denies any exertional shortness of breath or chest discomfort. The episodes of double vision have dramatically decreased. In addition, the patient has learned to recognize symptoms when they begin, and he can apparently stopped symptoms by staring in a specific direction. Overall he is delighted with his progress. He has not had any problems or complications with long-term anticoagulation using warfarin.   Current Outpatient Prescriptions  Medication Sig Dispense Refill  . Hydrocodone-Acetaminophen 7.5-300 MG TABS Take by mouth.    . psyllium (METAMUCIL) 58.6 % packet Take 1 packet by mouth daily.    . Tetrahydrozoline HCl (VISINE OP) Apply 1 drop to eye daily as needed (dry eyes).    . warfarin (COUMADIN) 5 MG tablet Take 5 mg by mouth daily. 1.5 MG DAILY AT 6 PM     No current facility-administered medications for this visit.       Physical Exam:   BP 124/81 (BP Location: Right Arm, Patient Position: Sitting, Cuff Size: Large)   Pulse 71   Resp 16   Ht 5\' 9"  (1.753 m)   Wt 191 lb (86.6 kg)   SpO2 97% Comment: ON RA  BMI 28.21 kg/m   General:  Well-appearing  Chest:   Clear to auscultation  CV:   Regular rate and rhythm without murmur  Incisions:  Completely healed, sternum is stable  Abdomen:  Soft nontender  Extremities:  Warm and well-perfused  Diagnostic Tests:  n/a   Impression:  Patient is doing well approximately one year status post aortic  valve replacement using a bioprosthetic tissue valve and coronary artery bypass grafting for antiphospholipid antibody syndrome with Shirlean SchleinLibman Sacks endocarditis  Plan:  We have not recommended any changes to the patient's current medications. At some point he should have a routine follow-up echocardiogram performed.  He will continue to follow-up intermittently with Dr. Herbie BaltimoreHarding.  The patient has been reminded regarding the importance of dental  hygiene and the lifelong need for antibiotic prophylaxis for all dental cleanings and other related invasive procedures.  In the future he will call and return to see us only should specific problems or questions arise.  I spent in excess of 15 minutes during the conduct of this office consultation and >50% of this time involved direct face-to-face encounter with the patient for counseling and/or coordination of their care.    Salvatore Decentlarence H. Cornelius Moraswen, MD 11/28/2016 12:58 PM

## 2016-12-05 ENCOUNTER — Ambulatory Visit: Payer: Self-pay | Admitting: Surgery

## 2016-12-05 DIAGNOSIS — K429 Umbilical hernia without obstruction or gangrene: Secondary | ICD-10-CM | POA: Diagnosis not present

## 2016-12-05 NOTE — H&P (Signed)
History of Present Illness Cristal Deer M. Sheranda Seabrooks MD; 12/05/2016 3:07 PM) Patient words: Brian Lloyd is a 56M here for evaluation of supraumbilical bulge. Has hx of anti-phospholipid ab and an aortic valve replacement 63yr ago with Dr. Cornelius Moras using a bovine valve. He has been maintained on warfarin.  He has an approximate one-year history of a reducible supraumbilical bulge.  He first noticed this following his cardiac surgery.  He had not noticed this prior.  The bulge causes discomfort with activity and particularly lifting which he has a lot of.  He denies a history of incarceration of the bulge.  It is always reducible.  The pain is alleviated with reduction.  He denies changes in bowel habits, constipation, diarrhea, nausea/vomiting.  PMH: Antiphospholipid antibody, hypertension, CVA 3 (related to a growth on his aortic valve which has been replaced with a bovine valve) - maintained on warfarin.  Past surgical history: Bilateral open anal hernia repair many years ago using mesh.  Open heart surgery as noted above.  Social: Denies use of tobacco, alcohol, drugs.  Family history: Denies family history of malignancy.  ROS: A comprehensive 10 system ROS was completed with the patient. Pertinent findings as noted above in the HPI.  The patient is a 56 year old male.   Allergies (Tanisha A. Manson Passey, RMA; 12/05/2016 2:25 PM) Naproxen *ANALGESICS - ANTI-INFLAMMATORY*   Allergies Reconciled    Medication History (Tanisha A. Manson Passey, RMA; 12/05/2016 2:25 PM) Warfarin Sodium  (5MG  Tablet, Oral) Active. Medications Reconciled     Review of Systems Cristal Deer M. Riku Buttery MD; 12/05/2016 3:08 PM) General Not Present- Appetite Loss, Chills and Fever. Note:  All other systems negative (unless as noted in HPI & included Review of Systems) Skin Not Present- Dryness, Rash and Ulcer. HEENT Not Present- Earache, Hearing Loss, Hoarseness, Nose Bleed, Oral Ulcers, Ringing in the Ears, Seasonal Allergies, Sinus  Pain, Sore Throat, Visual Disturbances, Wears glasses/contact lenses and Yellow Eyes. Respiratory Not Present- Bloody sputum, Chronic Cough, Difficulty Breathing, Snoring and Wheezing. Breast Not Present- Breast Mass, Breast Pain, Nipple Discharge and Skin Changes. Cardiovascular Not Present- Chest Pain, Difficulty Breathing Lying Down, Leg Cramps, Palpitations, Rapid Heart Rate, Shortness of Breath and Swelling of Extremities. Gastrointestinal Present- Abdominal Pain. Not Present- Bloating, Bloody Stool, Change in Bowel Habits, Chronic diarrhea, Constipation, Difficulty Swallowing, Excessive gas, Gets full quickly at meals, Hemorrhoids, Indigestion, Nausea, Rectal Pain and Vomiting. Male Genitourinary Not Present- Blood in Urine, Change in Urinary Stream, Frequency, Impotence, Nocturia, Painful Urination, Urgency and Urine Leakage. Musculoskeletal Not Present- Back Pain, Joint Pain, Joint Stiffness, Muscle Pain, Muscle Weakness and Swelling of Extremities. Neurological Not Present- Decreased Memory, Fainting, Headaches, Numbness, Seizures, Tingling, Tremor, Trouble walking and Weakness. Psychiatric Not Present- Anxiety, Bipolar, Change in Sleep Pattern, Depression, Fearful and Frequent crying. Endocrine Not Present- Cold Intolerance, Excessive Hunger, Hair Changes, Heat Intolerance and New Diabetes. Hematology Not Present- Easy Bruising, Excessive bleeding, Gland problems, HIV and Persistent Infections.  Vitals (Tanisha A. Brown RMA; 12/05/2016 2:24 PM) 12/05/2016 2:24 PM Weight: 189.2 lb   Height: 68 in  Body Surface Area: 2 m   Body Mass Index: 28.77 kg/m   Temp.: 97.6 F    Pulse: 78 (Regular)    BP: 128/86 (Sitting, Left Arm, Standard)       Physical Exam Cristal Deer M. Terran Hollenkamp MD; 12/05/2016 3:08 PM) The physical exam findings are as follows: Note: Constitutional: No acute distress; conversant; no deformities Eyes: Moist conjunctiva; no lid lag, anicteric pupils; pupils equal round  reactive to  light Neck: Trachea midline; no thyromegaly Lungs: Normal respiratory effort; no tactile fremitus CV: RRR; no palpable thrills; no pitting edema GI: Abdomen soft, NT; no palpable hepatosplenmegaly; reducible supraumbilical bulge - ~the size of my finger tip MSK: Normal gait; no clubbing/cyanosis Psych: Appropriate affect; alert and oriented x3 Lymphatic: No palpable cervical or axillary lymphadenopathy    Assessment & Plan Cristal Deer(Rashed Edler M. Keanu Lesniak MD; 12/05/2016 3:11 PM) UMBILICAL HERNIA (K42.9) Impression: 56M with reducible umbilical hernia; nonsmoker; BMI 28.8 -Referral back to his cardiologist and internal medicine for clearance for surgery -Following clearance, will schedule for open umbilical hernia repair with possible mesh; all other indicated procedures -The anatomy and physiology of the abdominal wall was described using pictures and diagrams. The pathophysiology of umbilical hernia was described in detail. The indications for surgery and surgical approaches were discussed. The possible use of mesh for repairing these was discussed. The procedure, material risks (including, but not limited to, pain, bleeding, need for blood transfusion, infection, mesh complications, recurrence, hematoma, seroma, heart attack, stroke, death) benefits and alternatives were discussed. His questions were answered to his satisfaction and he elected to proceed.

## 2016-12-12 ENCOUNTER — Ambulatory Visit (INDEPENDENT_AMBULATORY_CARE_PROVIDER_SITE_OTHER): Payer: BLUE CROSS/BLUE SHIELD

## 2016-12-12 DIAGNOSIS — Z7901 Long term (current) use of anticoagulants: Secondary | ICD-10-CM

## 2016-12-12 DIAGNOSIS — Z953 Presence of xenogenic heart valve: Secondary | ICD-10-CM | POA: Diagnosis not present

## 2016-12-12 DIAGNOSIS — I63119 Cerebral infarction due to embolism of unspecified vertebral artery: Secondary | ICD-10-CM

## 2016-12-12 DIAGNOSIS — Z8673 Personal history of transient ischemic attack (TIA), and cerebral infarction without residual deficits: Secondary | ICD-10-CM | POA: Diagnosis not present

## 2016-12-12 DIAGNOSIS — I825Z2 Chronic embolism and thrombosis of unspecified deep veins of left distal lower extremity: Secondary | ICD-10-CM

## 2016-12-12 DIAGNOSIS — I639 Cerebral infarction, unspecified: Secondary | ICD-10-CM

## 2016-12-12 DIAGNOSIS — D6861 Antiphospholipid syndrome: Secondary | ICD-10-CM

## 2016-12-12 LAB — POCT INR: INR: 3.6

## 2016-12-12 NOTE — Patient Instructions (Signed)
Patient instructed to take medications as defined in the Anti-coagulation Track section of this encounter.  Patient instructed to take today's dose.  Patient was instructed to take 1 & 1/2 tablets of your 5mg  peach-colored warfarin tablets by mouth, once-daily, at 6PM each day all days of week--EXCEPT on Mondays, Wednesdays, and Fridays--take ONLY ONE (1) tablet. Patient verbalized understanding of these instructions.

## 2016-12-12 NOTE — Progress Notes (Signed)
Reviewed Do not decrease dose for INR 3.6  DrG

## 2016-12-12 NOTE — Progress Notes (Signed)
Anticoagulation Management Brian BustleWayne Lloyd is a 56 y.o. male who reports to the clinic for monitoring of warfarin treatment.    Indication: CVA, DVT, TIA and antiphospholipid syndrome and aortic valve replacement with bioprosthetic valve  Duration: indefinite Supervising physician: Cephas DarbyJames Granfortuna  Anticoagulation Clinic Visit History: Patient does not report signs/symptoms of bleeding or thromboembolism  Other recent changes: Patient denies any diet, medications, or lifestyle changes. He does report needing a hernia repair, however a date and plan have not been set up yet. Anticoagulation Episode Summary    Current INR goal:   2.5-3.5  TTR:   73.1 % (8.7 mo)  Next INR check:   01/09/2017  INR from last check:   3.6! 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 Naproxen Anaphylaxis   Prior to Admission medications   Medication Sig Start Date End Date Taking? Authorizing Provider  Hydrocodone-Acetaminophen 7.5-300 MG TABS Take by mouth.    [provider]  psyllium (METAMUCIL) 58.6 % packet Take 1 packet by mouth daily.    [provider]  Tetrahydrozoline HCl (VISINE OP) Apply 1 drop to eye daily as needed (dry eyes).    [provider]  warfarin (COUMADIN) 5 MG tablet Take 5 mg by mouth daily. 1.5 MG DAILY AT  6 PM    [provider]   Past Medical History:  Diagnosis Date  . Acute cerebral infarction (HCC) 11/18/2015   Bifrontal embolic infarcts MRI/MRA 11/18/15; known aortic valve vegetation on lovenox/coumadin/ASA  . Antiphospholipid antibody syndrome (HCC) 11/06/2015   11/04/15 significant elevation of IgG anticardiolipin & beta-2-GP-1 antibodies  . 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 Aortic valve mass   . 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 DVT (deep venous thrombosis) (HCC)    left leg, on Xarelto  . 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 S/P aortic valve replacement with bioprosthetic valve 11/24/2015   [facility] bovine pericardial tissue valve  . Thrombocytopenia (HCC)    Social History   Socioeconomic History  . Marital status: Married    Spouse name: Not on file  . Number of children: Not on file  . Years of education: Not on file  . Highest education level: Not on file  Social Needs  . Financial resource strain: Not on file  . Food insecurity - worry: Not on file  . Food insecurity - inability: Not on file  . Transportation needs - medical: Not on file  . Transportation needs - non-medical: Not on file  Occupational History  . Not on file  Tobacco Use  .  Smoking status: Never Smoker  . Smokeless tobacco: Never Used  Substance and Sexual Activity  . Alcohol use: Yes    Alcohol/week: 1.8 oz    Types: 1 Glasses of wine, 1 Cans of beer, 1 Shots of liquor per week    Comment: rarely  . Drug use: No  . Sexual activity: Yes  Other Topics Concern  . Not on file  Social History Narrative  . Not on file   Family History  Problem Relation Age of Onset  . Heart disease Father   . Hypertension Father   . Heart  disease Maternal Grandfather   . Hypertension Maternal Grandfather   . Diabetes Paternal Grandfather   . Heart disease Paternal Grandfather     ASSESSMENT Recent Results: The most recent result is correlated with 47.5 mg per week: Lab Results  Component Value Date   INR 3.6 12/12/2016   INR 2.40 11/14/2016   INR 2.90 10/17/2016    Anticoagulation Dosing: Description   Take 1 & 1/2 tablets of your 5mg  peach-colored warfarin tablets by mouth, once-daily, at 6PM each day all days of week--EXCEPT on Mondays, Wednesdays, and Fridays--take ONLY ONE (1) tablet.     INR today: Supratherapeutic  PLAN Weekly dose was decreased by 5% to 45 mg per week Contact the clinic when a date and plan has been set for his hernia repair for follow up on his anticoagulation plan.  Patient Instructions  Patient instructed to take medications as defined in the Anti-coagulation Track section of this encounter.  Patient instructed to take today's dose.  Patient was instructed to take 1 & 1/2 tablets of your 5mg  peach-colored warfarin tablets by mouth, once-daily, at 6PM each day all days of week--EXCEPT on Mondays, Wednesdays, and Fridays--take ONLY ONE (1) tablet. Patient verbalized understanding of these instructions.     Patient advised to contact clinic or seek medical attention if signs/symptoms of bleeding or thromboembolism occur.  Patient verbalized understanding by repeating back information and was advised to contact me if further medication-related questions arise. Patient was also provided an information handout.  Follow-up Return in about 4 weeks (around 01/09/2017) for INR follow up at 1115.  Nolen MuAustin J Estee Yohe PharmD PGY1 Pharmacy Practice Resident 12/12/2016 11:36 AM   30 minutes spent face-to-face with the patient during the encounter. 50% of time spent on education. 50% of time was spent on collection of INR, interpretation of results, discussion on plan with patient, and documentation  into [email or web address] and EPIC.

## 2016-12-15 ENCOUNTER — Telehealth: Payer: Self-pay

## 2016-12-15 NOTE — Telephone Encounter (Signed)
    Chart reviewed as part of pre-operative protocol coverage. Because of Brian Lloyd's past medical history and time since last visit, he/she will require a follow-up visit in order to better assess preoperative cardiovascular risk. Their primary cardiologist is Dr. Herbie BaltimoreHarding.  Pre-op covering staff: - Please schedule appointment and call patient to inform them. - Please contact requesting surgeon's office via preferred method (i.e, phone, fax) to inform them of need for appointment prior to surgery.  [name], PA-C  12/15/2016, 1:13 PM

## 2016-12-15 NOTE — Telephone Encounter (Signed)
      [facility] Pre-operative Risk Assessment    Request for surgical clearance:  1. What type of surgery is being performed? Hernia repair   2. When is this surgery scheduled? TO BE SCHEDULED   3. Are there any medications that need to be held prior to surgery and how long?  NONE LISTED BUT PT IS ON COUMADIN 5MG  4. Practice name and name of physician performing surgery?  [facility]   5. What is your office phone and fax number? [phone] FX [phone]   6. Anesthesia type (None, local, MAC, general) ? GENERAL ANESTHESIA  PCP:  Corine Shelter, PA-C  Cardiologist:  Dr. Ellyn Hack Hematology: Dr. Mellissa Kohut 12/15/2016, 10:28 AM  _________________________________________________________________   (provider comments below)

## 2016-12-16 NOTE — Telephone Encounter (Signed)
LMTCB to schedule appt

## 2016-12-26 NOTE — Telephone Encounter (Signed)
Called patient to schedule cardiac clearance ov, no answer left message

## 2016-12-28 NOTE — Telephone Encounter (Signed)
lmtcb

## 2017-01-09 ENCOUNTER — Ambulatory Visit: Payer: BLUE CROSS/BLUE SHIELD

## 2017-01-11 NOTE — Telephone Encounter (Signed)
Leave message for pt to call back 

## 2017-01-14 ENCOUNTER — Other Ambulatory Visit: Payer: Self-pay | Admitting: Oncology

## 2017-01-17 NOTE — Telephone Encounter (Signed)
Reviewed 12/12 anticoag note.

## 2017-02-16 DIAGNOSIS — M47812 Spondylosis without myelopathy or radiculopathy, cervical region: Secondary | ICD-10-CM | POA: Diagnosis not present

## 2017-03-16 DIAGNOSIS — M542 Cervicalgia: Secondary | ICD-10-CM | POA: Diagnosis not present

## 2017-03-16 DIAGNOSIS — M9902 Segmental and somatic dysfunction of thoracic region: Secondary | ICD-10-CM | POA: Diagnosis not present

## 2017-03-16 DIAGNOSIS — M546 Pain in thoracic spine: Secondary | ICD-10-CM | POA: Diagnosis not present

## 2017-03-16 DIAGNOSIS — M545 Low back pain: Secondary | ICD-10-CM | POA: Diagnosis not present

## 2017-03-20 DIAGNOSIS — M546 Pain in thoracic spine: Secondary | ICD-10-CM | POA: Diagnosis not present

## 2017-03-20 DIAGNOSIS — M545 Low back pain: Secondary | ICD-10-CM | POA: Diagnosis not present

## 2017-03-20 DIAGNOSIS — M542 Cervicalgia: Secondary | ICD-10-CM | POA: Diagnosis not present

## 2017-03-20 DIAGNOSIS — M9902 Segmental and somatic dysfunction of thoracic region: Secondary | ICD-10-CM | POA: Diagnosis not present

## 2017-03-28 ENCOUNTER — Other Ambulatory Visit: Payer: Self-pay | Admitting: *Deleted

## 2017-03-28 MED ORDER — WARFARIN SODIUM 5 MG PO TABS: ORAL_TABLET | ORAL | 6 refills | Status: DC

## 2017-04-17 ENCOUNTER — Ambulatory Visit (INDEPENDENT_AMBULATORY_CARE_PROVIDER_SITE_OTHER): Payer: BLUE CROSS/BLUE SHIELD | Admitting: Pharmacist

## 2017-04-17 DIAGNOSIS — I825Z2 Chronic embolism and thrombosis of unspecified deep veins of left distal lower extremity: Secondary | ICD-10-CM

## 2017-04-17 DIAGNOSIS — D6861 Antiphospholipid syndrome: Secondary | ICD-10-CM | POA: Diagnosis not present

## 2017-04-17 DIAGNOSIS — I639 Cerebral infarction, unspecified: Secondary | ICD-10-CM

## 2017-04-17 DIAGNOSIS — Z953 Presence of xenogenic heart valve: Secondary | ICD-10-CM | POA: Diagnosis not present

## 2017-04-17 DIAGNOSIS — Z8673 Personal history of transient ischemic attack (TIA), and cerebral infarction without residual deficits: Secondary | ICD-10-CM

## 2017-04-17 DIAGNOSIS — Z5181 Encounter for therapeutic drug level monitoring: Secondary | ICD-10-CM

## 2017-04-17 DIAGNOSIS — I63119 Cerebral infarction due to embolism of unspecified vertebral artery: Secondary | ICD-10-CM

## 2017-04-17 DIAGNOSIS — Z7901 Long term (current) use of anticoagulants: Secondary | ICD-10-CM | POA: Diagnosis not present

## 2017-04-17 LAB — POCT INR: INR: 2.1

## 2017-04-17 NOTE — Progress Notes (Signed)
INTERNAL MEDICINE TEACHING ATTENDING ADDENDUM - Jemar Paulsen M.D  Duration- indefinite, Indication- DVT in the setting of antiphospholipid antibody syndrome, INR- sub therapeutic. Agree with pharmacy recommendations as outlined in their note.

## 2017-04-17 NOTE — Patient Instructions (Signed)
Patient instructed to take medications as defined in the Anti-coagulation Track section of this encounter.  Patient instructed to take today's dose.  Patient instructed to take  1 & 1/2 tablets of your 5mg peach-colored warfarin tablets by mouth, once-daily, at 6PM on Mondays and Fridays; all other days, take only one (1) tablet by mouth, once-daily, at 6PM.  Patient verbalized understanding of these instructions.    

## 2017-04-17 NOTE — Progress Notes (Signed)
Anticoagulation Management Brian Lloyd is a 57 y.o. male who reports to the clinic for monitoring of warfarin treatment.    Indication: CVA, history of; antiphospholipid antibody syndrome, chronic DVT, long term use of anticoagulants.   Duration: indefinite Supervising physician: Earl LagosNischal Narendra  Anticoagulation Clinic Visit History: Patient does not report signs/symptoms of bleeding or thromboembolism  Other recent changes: No diet, medications, lifestyle changes other than as endorsed in patient findings.  Anticoagulation Episode Summary    Current INR goal:   2.5-3.5  TTR:   71.0 % (1.1 y)  Next INR check:   01/09/2017  INR from last check:   2.10! 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 Naproxen Anaphylaxis   Prior to Admission medications   Medication Sig Start Date End Date Taking? Authorizing Provider  Hydrocodone-Acetaminophen 7.5-300 MG TABS Take by mouth.   Yes [provider]  psyllium (METAMUCIL) 58.6 % packet Take 1 packet by mouth daily.   Yes [provider]  Tetrahydrozoline HCl (VISINE OP) Apply 1 drop to eye daily as needed (dry eyes).   Yes [provider]  warfarin (COUMADIN) 5 MG tablet 5 mg (5 mg x 1) every Mon, Wed, Fri; 7.5 mg (5 mg x 1.5) all other days 03/28/17  Yes Levert FeinsteinGranfortuna,  Shiraz Bastyr M, MD   Past Medical History:  Diagnosis Date  . Acute cerebral infarction (HCC) 11/18/2015   Bifrontal embolic infarcts MRI/MRA 11/18/15; known aortic valve vegetation on lovenox/coumadin/ASA  . Antiphospholipid antibody syndrome (HCC) 11/06/2015   11/04/15 significant elevation of IgG anticardiolipin & beta-2-GP-1 antibodies  . 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 Aortic valve mass   . 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 DVT (deep venous thrombosis) (HCC)    left leg, on Xarelto  . 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 S/P aortic valve replacement with bioprosthetic valve 11/24/2015   [facility] bovine pericardial tissue valve  . Thrombocytopenia (HCC)    Social History   Socioeconomic History  . Marital status: Married    Spouse name: Not on file  . Number of children: Not on file  . Years of education: Not on file  . Highest education level: Not on file  Social Needs  . Financial resource strain: Not on file  . Food insecurity - worry: Not on file  . Food insecurity - inability: Not on file  . Transportation needs - medical: Not on file  . Transportation needs - non-medical: Not on file  Occupational History  . Not on file  Tobacco Use  . Smoking status: Never  Smoker  . Smokeless tobacco: Never Used  Substance and Sexual Activity  . Alcohol use: Yes    Alcohol/week: 1.8 oz    Types: 1 Glasses of wine, 1 Cans of beer, 1 Shots of liquor per week    Comment: rarely  . Drug use: No  . Sexual activity: Yes  Other Topics Concern  . Not on file  Social History Narrative  . Not on file   Family History  Problem Relation Age of Onset  . Heart disease Father   . Hypertension Father   . Heart disease Maternal  Grandfather   . Hypertension Maternal Grandfather   . Diabetes Paternal Grandfather   . Heart disease Paternal Grandfather     ASSESSMENT Recent Results: The most recent result is correlated with 35 mg per week: Lab Results  Component Value Date   INR 2.10 04/17/2017   INR 3.6 12/12/2016   INR 2.40 11/14/2016    Anticoagulation Dosing: Description   Take 1 & 1/2 tablets of your 5mg  peach-colored warfarin tablets by mouth, once-daily, at [facility] on Mondays and Fridays; all other days, take only one (1) tablet by mouth, once-daily, at 6PM.      INR today: Subtherapeutic  PLAN Weekly dose was increased to 40mg /wk.   Patient Instructions  Patient instructed to take medications as defined in the Anti-coagulation Track section of this encounter.  Patient instructed to take today's dose.  Patient instructed to take 1 & 1/2 tablets of your 5mg  peach-colored warfarin tablets by mouth, once-daily, at [facility] on Mondays and Fridays; all other days, take only one (1) tablet by mouth, once-daily, at [facility].  Patient verbalized understanding of these instructions.     Patient advised to contact clinic or seek medical attention if signs/symptoms of bleeding or thromboembolism occur.  Patient verbalized understanding by repeating back information and was advised to contact me if further medication-related questions arise. Patient was also provided an information handout.  Follow-up Return in 2 weeks (on 05/01/2017) for Follow up INR at 1100h.  Elicia Lamp, PharmD, CACP, CPP  15 minutes spent face-to-face with the patient during the encounter. 50% of time spent on education. 50% of time was spent on fingerstick point of care INR sample collection, processing, results determination, dose adjustment and documentation in [email or web address]

## 2017-05-01 ENCOUNTER — Ambulatory Visit (INDEPENDENT_AMBULATORY_CARE_PROVIDER_SITE_OTHER): Payer: BLUE CROSS/BLUE SHIELD | Admitting: Pharmacist

## 2017-05-01 DIAGNOSIS — Z954 Presence of other heart-valve replacement: Secondary | ICD-10-CM | POA: Diagnosis not present

## 2017-05-01 DIAGNOSIS — Z953 Presence of xenogenic heart valve: Secondary | ICD-10-CM

## 2017-05-01 DIAGNOSIS — I63119 Cerebral infarction due to embolism of unspecified vertebral artery: Secondary | ICD-10-CM

## 2017-05-01 DIAGNOSIS — D6861 Antiphospholipid syndrome: Secondary | ICD-10-CM

## 2017-05-01 DIAGNOSIS — Z5181 Encounter for therapeutic drug level monitoring: Secondary | ICD-10-CM

## 2017-05-01 DIAGNOSIS — I825Z2 Chronic embolism and thrombosis of unspecified deep veins of left distal lower extremity: Secondary | ICD-10-CM | POA: Diagnosis not present

## 2017-05-01 DIAGNOSIS — Z8673 Personal history of transient ischemic attack (TIA), and cerebral infarction without residual deficits: Secondary | ICD-10-CM | POA: Diagnosis not present

## 2017-05-01 DIAGNOSIS — Z7901 Long term (current) use of anticoagulants: Secondary | ICD-10-CM | POA: Diagnosis not present

## 2017-05-01 DIAGNOSIS — I639 Cerebral infarction, unspecified: Secondary | ICD-10-CM

## 2017-05-01 LAB — POCT INR: INR: 2.6

## 2017-05-01 NOTE — Progress Notes (Signed)
Anticoagulation Management Brian Lloyd is a 57 y.o. male who reports to the clinic for monitoring of warfarin treatment.    Indication: Acute cerebral infarction (HCC)[163.0]-resolved; Antiphosholipid antibody syndrome (HCC)]D68.61], Chronic deep vein thrombosis (DVT) of distal vein of left lower extremitiy; long term (current) use of anticoagulation.    Duration: indefinite Supervising physician: Cephas Darby  Anticoagulation Clinic Visit History: Patient does not report signs/symptoms of bleeding or thromboembolism  Other recent changes: No diet, medications, lifestyle changes endorsed by the patient to me.  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 Naproxen Anaphylaxis   Prior to Admission medications   Medication Sig Start Date End Date Taking? Authorizing Provider  Hydrocodone-Acetaminophen 7.5-300 MG TABS Take by mouth.   Yes [provider]  psyllium (METAMUCIL) 58.6 % packet Take 1 packet by mouth daily.   Yes [provider]  Tetrahydrozoline HCl (VISINE OP) Apply 1 drop to eye daily as needed (dry eyes).   Yes [provider]  warfarin (COUMADIN) 5 MG tablet  5 mg (5 mg x 1) every Mon, Wed, Fri; 7.5 mg (5 mg x 1.5) all other days 03/28/17  Yes Levert Feinstein, MD   Past Medical History:  Diagnosis Date  . Acute cerebral infarction (HCC) 11/18/2015   Bifrontal embolic infarcts MRI/MRA 11/18/15; known aortic valve vegetation on lovenox/coumadin/ASA  . Antiphospholipid antibody syndrome (HCC) 11/06/2015   11/04/15 significant elevation of IgG anticardiolipin & beta-2-GP-1 antibodies  . 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 Aortic valve mass   . 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 DVT (deep venous thrombosis) (HCC)    left leg, on Xarelto  . 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 S/P aortic valve replacement with bioprosthetic valve 11/24/2015   25 mm [facility] Ease bovine pericardial tissue valve  . Thrombocytopenia (HCC)    Social History   Socioeconomic History  . Marital status: Married    Spouse name: Not on file  . Number of children: Not on file  . Years of education: Not on file  . Highest education level: Not on file  Occupational History  . Not on file  Social Needs  . Financial resource strain: Not on file  . Food insecurity:    Worry: Not on file    Inability: Not on file  . Transportation needs:    Medical: Not on file  Non-medical: Not on file  Tobacco Use  . Smoking status: Never Smoker  . Smokeless tobacco: Never Used  Substance and Sexual Activity  . Alcohol use: Yes    Alcohol/week: 1.8 oz    Types: 1 Glasses of wine, 1 Cans of beer, 1 Shots of liquor per week    Comment: rarely  . Drug use: No  . Sexual activity: Yes  Lifestyle  . Physical activity:    Days per week: Not on file    Minutes per session: Not on file  . Stress: Not on file  Relationships  . Social  connections:    Talks on phone: Not on file    Gets together: Not on file    Attends religious service: Not on file    Active member of club or organization: Not on file    Attends meetings of clubs or organizations: Not on file    Relationship status: Not on file  Other Topics Concern  . Not on file  Social History Narrative  . Not on file   Family History  Problem Relation Age of Onset  . Heart disease Father   . Hypertension Father   . Heart disease Maternal Grandfather   . Hypertension Maternal Grandfather   . Diabetes Paternal Grandfather   . Heart disease Paternal Grandfather     ASSESSMENT Recent Results: The most recent result is correlated with 40 mg per week: Lab Results  Component Value Date   INR 2.60 05/01/2017   INR 2.10 04/17/2017   INR 3.6 12/12/2016    Anticoagulation Dosing: Description   Take 1 & 1/2 tablets of your 5mg  peach-colored warfarin tablets by mouth, once-daily, at [facility]6PM on Mondays and Fridays; all other days, take only one (1) tablet by mouth, once-daily, at 6PM.      INR today: Therapeutic  PLAN Weekly dose was unchanged.  Patient Instructions  Patient instructed to take medications as defined in the Anti-coagulation Track section of this encounter.  Patient instructed to take today's dose.  Patient instructed to take 1 & 1/2 tablets of your 5mg  peach-colored warfarin tablets by mouth, once-daily, at [facility] on Mondays and Fridays; all other days, take only one (1) tablet by mouth, once-daily, at [facility]6PM.  Patient verbalized understanding of these instructions.     Patient advised to contact clinic or seek medical attention if signs/symptoms of bleeding or thromboembolism occur.  Patient verbalized understanding by repeating back information and was advised to contact me if further medication-related questions arise. Patient was also provided an information handout.  Follow-up Return in 1 month (on 05/29/2017) for Follow up INR at  1115h.  Barbera SettersJames B Miho Monda  15 minutes spent face-to-face with the patient during the encounter. 50% of time spent on education. 50% of time was spent on fingerstick point of care INR sample collection, processing, results determination and documentation in [email or web address].

## 2017-05-01 NOTE — Patient Instructions (Signed)
Patient instructed to take medications as defined in the Anti-coagulation Track section of this encounter.  Patient instructed to take today's dose.  Patient instructed to take 1 & 1/2 tablets of your 5mg  peach-colored warfarin tablets by mouth, once-daily, at [facility]6PM on Mondays and Fridays; all other days, take only one (1) tablet by mouth, once-daily, at [facility]6PM.  Patient verbalized understanding of these instructions.

## 2017-05-02 NOTE — Progress Notes (Signed)
Reviewed thx DrG 

## 2017-05-11 DIAGNOSIS — M47812 Spondylosis without myelopathy or radiculopathy, cervical region: Secondary | ICD-10-CM | POA: Diagnosis not present

## 2017-05-29 ENCOUNTER — Ambulatory Visit (INDEPENDENT_AMBULATORY_CARE_PROVIDER_SITE_OTHER): Payer: BLUE CROSS/BLUE SHIELD | Admitting: Pharmacist

## 2017-05-29 DIAGNOSIS — Z8673 Personal history of transient ischemic attack (TIA), and cerebral infarction without residual deficits: Secondary | ICD-10-CM

## 2017-05-29 DIAGNOSIS — Z7901 Long term (current) use of anticoagulants: Secondary | ICD-10-CM

## 2017-05-29 DIAGNOSIS — D6861 Antiphospholipid syndrome: Secondary | ICD-10-CM

## 2017-05-29 DIAGNOSIS — I825Z2 Chronic embolism and thrombosis of unspecified deep veins of left distal lower extremity: Secondary | ICD-10-CM

## 2017-05-29 DIAGNOSIS — Z5181 Encounter for therapeutic drug level monitoring: Secondary | ICD-10-CM

## 2017-05-29 DIAGNOSIS — I639 Cerebral infarction, unspecified: Secondary | ICD-10-CM

## 2017-05-29 DIAGNOSIS — Z953 Presence of xenogenic heart valve: Secondary | ICD-10-CM

## 2017-05-29 DIAGNOSIS — I63119 Cerebral infarction due to embolism of unspecified vertebral artery: Secondary | ICD-10-CM

## 2017-05-29 LAB — POCT INR: INR: 3.1

## 2017-05-29 NOTE — Patient Instructions (Signed)
Patient instructed to take medications as defined in the Anti-coagulation Track section of this encounter.  Patient instructed to take today's dose.  Patient instructed to take  1 & 1/2 tablets of your  peach-colored warfarin tablets by mouth, once-daily, at [facility] on Mondays and Fridays; all other days, take only one (1) tablet by mouth, once-daily, at [facility].  Patient verbalized understanding of these instructions.

## 2017-05-29 NOTE — Progress Notes (Signed)
Anticoagulation Management Brian Lloyd is a 57 y.o. male who reports to the clinic for monitoring of warfarin treatment.    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   Duration: indefinite Supervising physician: Cephas Darby  Anticoagulation Clinic Visit History: Patient does not report signs/symptoms of bleeding or thromboembolism  Other recent changes: No diet, medications, lifestyle changes endorsed by the patient at this visit.  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 Naproxen Anaphylaxis   Prior to Admission medications   Medication Sig Start Date End Date Taking? Authorizing Provider  Hydrocodone-Acetaminophen 7.5-300 MG TABS Take by mouth.   Yes [provider]  psyllium (METAMUCIL) 58.6 % packet Take 1 packet by mouth daily.   Yes [provider]  Tetrahydrozoline HCl (VISINE OP) Apply 1 drop to eye daily as needed (dry eyes).   Yes [provider]  warfarin (COUMADIN) 5 MG tablet 5 mg (5 mg x 1) every Mon, Wed, Fri; 7.5 mg (5 mg x 1.5)  all other days 03/28/17  Yes Levert Feinstein, MD   Past Medical History:  Diagnosis Date  . Acute cerebral infarction (HCC) 11/18/2015   Bifrontal embolic infarcts MRI/MRA 11/18/15; known aortic valve vegetation on lovenox/coumadin/ASA  . Antiphospholipid antibody syndrome (HCC) 11/06/2015   11/04/15 significant elevation of IgG anticardiolipin & beta-2-GP-1 antibodies  . 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 Aortic valve mass   . 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 DVT (deep venous thrombosis) (HCC)    left leg, on Xarelto  . 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 S/P aortic valve replacement with bioprosthetic valve 11/24/2015   25 mm [facility] Ease bovine pericardial tissue valve  . Thrombocytopenia (HCC)    Social History   Socioeconomic History  . Marital status: Married    Spouse name: Not on file  . Number of children: Not on file  . Years of education: Not on file  . Highest education level: Not on file  Occupational History  . Not on file  Social Needs  . Financial resource strain: Not on file  . Food insecurity:    Worry: Not on file    Inability: Not on file  . Transportation needs:    Medical: Not on file    Non-medical: Not on file  Tobacco Use  . Smoking status: Never Smoker  . Smokeless tobacco: Never Used  Substance and Sexual Activity  . Alcohol use: Yes    Alcohol/week: 1.8 oz    Types: 1 Glasses of wine, 1 Cans of beer, 1 Shots of liquor per week    Comment: rarely  . Drug use: No  . Sexual activity: Yes  Lifestyle  . Physical activity:    Days per week: Not on file    Minutes per session: Not on file  . Stress: Not on file  Relationships  . Social connections:    Talks on phone: Not on file    Gets  together: Not on file    Attends religious service: Not on file    Active member of club or organization: Not on file    Attends meetings of clubs or organizations: Not on file    Relationship status: Not on file  Other Topics Concern  . Not on file  Social History Narrative  . Not on file   Family History  Problem Relation Age of Onset  . Heart disease Father   . Hypertension Father   . Heart disease Maternal Grandfather   . Hypertension Maternal Grandfather   . Diabetes Paternal Grandfather   . Heart disease Paternal Grandfather     ASSESSMENT Recent Results: The most recent result is correlated with 40 mg per week: Lab Results  Component Value Date   INR 3.10 05/29/2017   INR 2.60 05/01/2017   INR 2.10 04/17/2017    Anticoagulation Dosing: Description   Take 1 & 1/2 tablets of your  peach-colored warfarin tablets by mouth, once-daily, at [facility] on Mondays and Fridays; all other days, take only one (1) tablet by mouth, once-daily, at 6PM.      INR today: Therapeutic  PLAN Weekly dose was unchanged.  Patient Instructions  Patient instructed to take medications as defined in the Anti-coagulation Track section of this encounter.  Patient instructed to take today's dose.  Patient instructed to take  1 & 1/2 tablets of your  peach-colored warfarin tablets by mouth, once-daily, at [facility] on Mondays and Fridays; all other days, take only one (1) tablet by mouth, once-daily, at [facility].  Patient verbalized understanding of these instructions.     Patient advised to contact clinic or seek medical attention if signs/symptoms of bleeding or thromboembolism occur.  Patient verbalized understanding by repeating back information and was advised to contact me if further medication-related questions arise. Patient was also provided an information handout.  Follow-up Return in about 5 weeks (around 07/03/2017) for Follow up INR at 1130h.  Elicia Lamp, PharmD, CACP, CPP  15  minutes spent face-to-face with the patient during the encounter. 50% of time spent on education. 50% of time was spent on fingerstick point of care INR sample collection, processing, results determination and documentation in [email or web address]

## 2017-05-29 NOTE — Progress Notes (Signed)
Reviewed thx DrG 

## 2017-06-01 ENCOUNTER — Telehealth: Payer: Self-pay | Admitting: Pharmacist

## 2017-06-05 NOTE — Telephone Encounter (Signed)
Patient requested refill authorization for his warfarin--done.

## 2017-07-03 ENCOUNTER — Ambulatory Visit (INDEPENDENT_AMBULATORY_CARE_PROVIDER_SITE_OTHER): Payer: BLUE CROSS/BLUE SHIELD | Admitting: Pharmacist

## 2017-07-03 DIAGNOSIS — Z8673 Personal history of transient ischemic attack (TIA), and cerebral infarction without residual deficits: Secondary | ICD-10-CM

## 2017-07-03 DIAGNOSIS — Z953 Presence of xenogenic heart valve: Secondary | ICD-10-CM

## 2017-07-03 DIAGNOSIS — D6861 Antiphospholipid syndrome: Secondary | ICD-10-CM

## 2017-07-03 DIAGNOSIS — I63119 Cerebral infarction due to embolism of unspecified vertebral artery: Secondary | ICD-10-CM

## 2017-07-03 DIAGNOSIS — I639 Cerebral infarction, unspecified: Secondary | ICD-10-CM

## 2017-07-03 DIAGNOSIS — I825Z2 Chronic embolism and thrombosis of unspecified deep veins of left distal lower extremity: Secondary | ICD-10-CM

## 2017-07-03 LAB — POCT INR: INR: 3.4 — AB (ref 2.0–3.0)

## 2017-07-03 NOTE — Patient Instructions (Signed)
Patient instructed to take medications as defined in the Anti-coagulation Track section of this encounter.  Patient instructed to take today's dose.  Patient instructed to take 1 & 1/2 tablets of your 5mg  peach-colored warfarin tablets by mouth, once-daily, at [facility] on Mondays, Wednesdays and  Fridays; all other days, take only one (1) tablet by mouth, once-daily, at [facility]. Patient verbalized understanding of these instructions.

## 2017-07-03 NOTE — Progress Notes (Signed)
Reviewed thx DrG 

## 2017-07-03 NOTE — Progress Notes (Signed)
Anticoagulation Management Brian BustleWayne Lloyd is a 57 y.o. male who reports to the clinic for monitoring of warfarin treatment.    Indication: CVA, History of; Antiphospholipid Antibody Syndrom (HCC) [D68.61]; Chronic deep vein thrombosis (DVT), History of; Long term current use of anticoagulant.   Duration: indefinite Supervising physician: Brian Lloyd  Anticoagulation Clinic Visit History: Patient does not report signs/symptoms of bleeding or thromboembolism  Other recent changes: No diet, medications, lifestyle changes endorsed by the patient to me at this visit.  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 Naproxen Anaphylaxis   Prior to Admission medications   Medication Sig Start Date End Date Taking? Authorizing Provider  Hydrocodone-Acetaminophen 7.5-300 MG TABS Take by mouth.   Yes [provider]  psyllium (METAMUCIL) 58.6 % packet Take 1 packet by mouth daily.   Yes [provider]  Tetrahydrozoline HCl (VISINE OP) Apply 1 drop to eye daily as needed (dry eyes).   Yes [provider]  warfarin (COUMADIN) 5 MG tablet 5 mg (5 mg x 1) every Mon, Wed, Fri;  7.5 mg (5 mg x 1.5) all other days 03/28/17  Yes Brian Lloyd, Brian Dowe M, MD   Past Medical History:  Diagnosis Date  . Acute cerebral infarction (HCC) 11/18/2015   Bifrontal embolic infarcts MRI/MRA 11/18/15; known aortic valve vegetation on lovenox/coumadin/ASA  . Antiphospholipid antibody syndrome (HCC) 11/06/2015   11/04/15 significant elevation of IgG anticardiolipin & beta-2-GP-1 antibodies  . 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 Aortic valve mass   . 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 DVT (deep venous thrombosis) (HCC)    left leg, on Xarelto  . 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 S/P aortic valve replacement with bioprosthetic valve 11/24/2015   [facility] Ease bovine pericardial tissue valve  . Thrombocytopenia (HCC)    Social History   Socioeconomic History  . Marital status: Married    Spouse name: Not on file  . Number of children: Not on file  . Years of education: Not on file  . Highest education level: Not on file  Occupational History  . Not on file  Social Needs  . Financial resource strain: Not on file  . Food insecurity:    Worry: Not on file    Inability: Not on file  . Transportation needs:    Medical: Not on file    Non-medical: Not  on file  Tobacco Use  . Smoking status: Never Smoker  . Smokeless tobacco: Never Used  Substance and Sexual Activity  . Alcohol use: Yes    Alcohol/week: 1.8 oz    Types: 1 Glasses of wine, 1 Cans of beer, 1 Shots of liquor per week    Comment: rarely  . Drug use: No  . Sexual activity: Yes  Lifestyle  . Physical activity:    Days per week: Not on file    Minutes per session: Not on file  . Stress: Not on file  Relationships  . Social connections:    Talks on phone: Not on  file    Gets together: Not on file    Attends religious service: Not on file    Active member of club or organization: Not on file    Attends meetings of clubs or organizations: Not on file    Relationship status: Not on file  Other Topics Concern  . Not on file  Social History Narrative  . Not on file   Family History  Problem Relation Age of Onset  . Heart disease Father   . Hypertension Father   . Heart disease Maternal Grandfather   . Hypertension Maternal Grandfather   . Diabetes Paternal Grandfather   . Heart disease Paternal Grandfather     ASSESSMENT Recent Results: The most recent result is correlated with 40 mg per week: Lab Results  Component Value Date   INR 3.4 (A) 07/03/2017   INR 3.10 05/29/2017   INR 2.60 05/01/2017    Anticoagulation Dosing: Description   Take 1 & 1/2 tablets of your 5mg  peach-colored warfarin tablets by mouth, once-daily, at [facility] on Mondays, Wednesdays and  Fridays; all other days, take only one (1) tablet by mouth, once-daily, at 6PM.      INR today: Therapeutic  PLAN Weekly dose was increased by 6% to 42.5 mg per week  Patient Instructions  Patient instructed to take medications as defined in the Anti-coagulation Track section of this encounter.  Patient instructed to take today's dose.  Patient instructed to take 1 & 1/2 tablets of your 5mg  peach-colored warfarin tablets by mouth, once-daily, at [facility] on Mondays, Wednesdays and  Fridays; all other days, take only one (1) tablet by mouth, once-daily, at [facility]. Patient verbalized understanding of these instructions.     Patient advised to contact clinic or seek medical attention if signs/symptoms of bleeding or thromboembolism occur.  Patient verbalized understanding by repeating back information and was advised to contact me if further medication-related questions arise. Patient was also provided an information handout.  Follow-up Return in 5 weeks (on 08/07/2017) for Follow up INR at  1145h.  Brian Lloyd, PharmD, CACP, CPP  15 minutes spent face-to-face with the patient during the encounter. 50% of time spent on education. 50% of time was spent on fingerstick point of care INR sample collection, processing, results determination, dose adjustment and documentation in [email or web address]

## 2017-08-07 ENCOUNTER — Ambulatory Visit: Payer: BLUE CROSS/BLUE SHIELD

## 2017-11-16 DIAGNOSIS — K429 Umbilical hernia without obstruction or gangrene: Secondary | ICD-10-CM | POA: Diagnosis not present

## 2017-11-20 ENCOUNTER — Telehealth: Payer: Self-pay | Admitting: Pharmacist

## 2017-11-20 NOTE — Telephone Encounter (Signed)
Left my phone number for patient to call--RE:  Needing INR performed as a part of medical clearence for planned hernia repair procedure that we have been apprised of by [facility]. Fax received 18-OCT-19.

## 2017-11-20 NOTE — Telephone Encounter (Signed)
Patient returns my phone call. Agrees to come to Anticoagulation Management Clinic on Monday 28-OCT-19 at 1000h.

## 2017-11-27 ENCOUNTER — Ambulatory Visit (INDEPENDENT_AMBULATORY_CARE_PROVIDER_SITE_OTHER): Payer: BLUE CROSS/BLUE SHIELD | Admitting: Pharmacist

## 2017-11-27 DIAGNOSIS — I825Z2 Chronic embolism and thrombosis of unspecified deep veins of left distal lower extremity: Secondary | ICD-10-CM

## 2017-11-27 DIAGNOSIS — Z953 Presence of xenogenic heart valve: Secondary | ICD-10-CM

## 2017-11-27 DIAGNOSIS — Z7901 Long term (current) use of anticoagulants: Secondary | ICD-10-CM

## 2017-11-27 DIAGNOSIS — Z5181 Encounter for therapeutic drug level monitoring: Secondary | ICD-10-CM

## 2017-11-27 DIAGNOSIS — Z8673 Personal history of transient ischemic attack (TIA), and cerebral infarction without residual deficits: Secondary | ICD-10-CM | POA: Diagnosis not present

## 2017-11-27 DIAGNOSIS — I63119 Cerebral infarction due to embolism of unspecified vertebral artery: Secondary | ICD-10-CM

## 2017-11-27 DIAGNOSIS — D6861 Antiphospholipid syndrome: Secondary | ICD-10-CM | POA: Diagnosis not present

## 2017-11-27 DIAGNOSIS — I639 Cerebral infarction, unspecified: Secondary | ICD-10-CM

## 2017-11-27 LAB — POCT INR: INR: 2.4 (ref 2.0–3.0)

## 2017-11-27 NOTE — Progress Notes (Signed)
Reviewed  Did we give him instructions about what to do around planned surgery? He is someone who will need a lovenox bridge - high risk. Let's make sure he has a supply of lovenox on hand.  Stop coumadin 5 days pre-op. Begin lovenox 1 mg/kg 7A/7P 3 days pre-op Take AM dose 1 day pre-op; do not take PM dose. Surgery day no lovenox. Resume coumadin that evening. Resume lovenox either 1 mg/kg BID or 1.5 mg/kg single daily dose starting day after surgery. Check PT/INR after 4 doses of warfarin. I am away this week. Call to discuss [phone] if you have any questions.  Thanks DrG

## 2017-11-27 NOTE — Patient Instructions (Signed)
Patient instructed to take medications as defined in the Anti-coagulation Track section of this encounter.  Patient instructed to take today's dose.  Patient instructed to take 1 & 1/2 tablets of your 5mg peach-colored warfarin tablets by mouth, once-daily, at 6PM on Mondays, Wednesdays and  Fridays; all other days, take only one (1) tablet by mouth, once-daily, at 6PM. Patient verbalized understanding of these instructions.    

## 2017-11-27 NOTE — Progress Notes (Signed)
Anticoagulation Management Brian Lloyd is a 57 y.o. male who reports to the clinic for monitoring of warfarin treatment.    Indication: Acute cerebral infarction, Antiphoshpholipid antibody syndrome, chronic DVT of distal vein of LLE, long term current use of anticoagulant.    Duration: indefinite Supervising physician: Cephas Darby  Anticoagulation Clinic Visit History: Patient does not report signs/symptoms of bleeding or thromboembolism  Other recent changes: No diet, medications, lifestyle changes except as noted in patient findings.  Anticoagulation Episode Summary    Current INR goal:   2.5-3.5  TTR:   77.4 % (1.7 y)  Next INR check:   12/25/2017  INR from last check:   2.4! 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 Naproxen Anaphylaxis   Prior to Admission medications   Medication Sig Start Date End Date Taking? Authorizing Provider  Hydrocodone-Acetaminophen 7.5-300 MG TABS Take by mouth.   Yes [provider]  psyllium (METAMUCIL) 58.6 % packet Take 1 packet by mouth daily.   Yes [provider]  Tetrahydrozoline HCl (VISINE OP) Apply 1 drop to eye daily as needed (dry eyes).   Yes [provider]  warfarin (COUMADIN) 5 MG tablet 5 mg (5 mg x 1) every Mon, Wed, Fri; 7.5 mg (5 mg x  1.5) all other days 03/28/17  Yes Levert Feinstein, MD   Past Medical History:  Diagnosis Date  . Acute cerebral infarction (HCC) 11/18/2015   Bifrontal embolic infarcts MRI/MRA 11/18/15; known aortic valve vegetation on lovenox/coumadin/ASA  . Antiphospholipid antibody syndrome (HCC) 11/06/2015   11/04/15 significant elevation of IgG anticardiolipin & beta-2-GP-1 antibodies  . 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 Aortic valve mass   . 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 DVT (deep venous thrombosis) (HCC)    left leg, on Xarelto  . 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 S/P aortic valve replacement with bioprosthetic valve 11/24/2015   25 mm [facility] Ease bovine pericardial tissue valve  . Thrombocytopenia (HCC)    Social History   Socioeconomic History  . Marital status: Married    Spouse name: Not on file  . Number of children: Not on file  . Years of education: Not on file  . Highest education level: Not on file  Occupational History  . Not on file  Social Needs  . Financial resource strain: Not on file  . Food insecurity:    Worry: Not on file    Inability: Not on file  . Transportation needs:    Medical: Not on file    Non-medical: Not on file  Tobacco  Use  . Smoking status: Never Smoker  . Smokeless tobacco: Never Used  Substance and Sexual Activity  . Alcohol use: Yes    Alcohol/week: 3.0 standard drinks    Types: 1 Glasses of wine, 1 Cans of beer, 1 Shots of liquor per week    Comment: rarely  . Drug use: No  . Sexual activity: Yes  Lifestyle  . Physical activity:    Days per week: Not on file    Minutes per session: Not on file  . Stress: Not on file  Relationships  . Social connections:    Talks on phone: Not on  file    Gets together: Not on file    Attends religious service: Not on file    Active member of club or organization: Not on file    Attends meetings of clubs or organizations: Not on file    Relationship status: Not on file  Other Topics Concern  . Not on file  Social History Narrative  . Not on file   Family History  Problem Relation Age of Onset  . Heart disease Father   . Hypertension Father   . Heart disease Maternal Grandfather   . Hypertension Maternal Grandfather   . Diabetes Paternal Grandfather   . Heart disease Paternal Grandfather     ASSESSMENT Recent Results: The most recent result is correlated with 42.5 mg per week: Lab Results  Component Value Date   INR 2.4 11/27/2017   INR 3.4 (A) 07/03/2017   INR 3.10 05/29/2017    Anticoagulation Dosing: Description   Take 1 & 1/2 tablets of your 5mg  peach-colored warfarin tablets by mouth, once-daily, at [facility] on Mondays, Wednesdays and  Fridays; all other days, take only one (1) tablet by mouth, once-daily, at 6PM.      INR today: Subtherapeutic  PLAN Weekly dose was unchanged. Needing clearance from anticoagulation management clinic/HEMATOLOGIST for date to be determined (based upon clearance) planned umbilical hernia repair.   Patient Instructions  Patient instructed to take medications as defined in the Anti-coagulation Track section of this encounter.  Patient instructed to take today's dose.  Patient instructed to take  1 & 1/2 tablets of your 5mg  peach-colored warfarin tablets by mouth, once-daily, at [facility] on Mondays, Wednesdays and  Fridays; all other days, take only one (1) tablet by mouth, once-daily, at [facility]. Patient verbalized understanding of these instructions.     Patient advised to contact clinic or seek medical attention if signs/symptoms of bleeding or thromboembolism occur.  Patient verbalized understanding by repeating back information and was advised to contact me if further medication-related  questions arise. Patient was also provided an information handout.  Follow-up Return in 4 weeks (on 12/25/2017) for Follow up INR at 1000h.  Elicia Lamp, PharmD, CPP  15 minutes spent face-to-face with the patient during the encounter. 50% of time spent on education. 50% of time was spent on fingerstick point of care INR sample collection, processing, results determination, and documentation in [email or web address]

## 2017-12-01 HISTORY — PX: TRANSTHORACIC ECHOCARDIOGRAM: SHX275

## 2017-12-13 ENCOUNTER — Encounter: Payer: Self-pay | Admitting: Pharmacist

## 2017-12-14 ENCOUNTER — Ambulatory Visit (INDEPENDENT_AMBULATORY_CARE_PROVIDER_SITE_OTHER): Payer: BLUE CROSS/BLUE SHIELD | Admitting: Physician Assistant

## 2017-12-14 ENCOUNTER — Encounter: Payer: Self-pay | Admitting: Pharmacist

## 2017-12-14 ENCOUNTER — Encounter: Payer: Self-pay | Admitting: Physician Assistant

## 2017-12-14 VITALS — BP 136/86 | HR 66 | Ht 68.0 in | Wt 184.2 lb

## 2017-12-14 DIAGNOSIS — D6861 Antiphospholipid syndrome: Secondary | ICD-10-CM | POA: Diagnosis not present

## 2017-12-14 DIAGNOSIS — Z951 Presence of aortocoronary bypass graft: Secondary | ICD-10-CM

## 2017-12-14 DIAGNOSIS — Z952 Presence of prosthetic heart valve: Secondary | ICD-10-CM

## 2017-12-14 DIAGNOSIS — M3211 Endocarditis in systemic lupus erythematosus: Secondary | ICD-10-CM

## 2017-12-14 DIAGNOSIS — Z8673 Personal history of transient ischemic attack (TIA), and cerebral infarction without residual deficits: Secondary | ICD-10-CM

## 2017-12-14 DIAGNOSIS — Z01810 Encounter for preprocedural cardiovascular examination: Secondary | ICD-10-CM

## 2017-12-14 NOTE — Progress Notes (Signed)
Cardiology Office Note    Date:  12/14/2017   ID:  Brian Lloyd, DOB [birth year], MRN [id number]  PCP:  Joycelyn Rua, MD  Cardiologist:  Dr. Herbie Baltimore Hematology: Dr. Cyndie Chime   Chief Complaint  Patient presents with  . Pre-op Exam    requested by Dr. Cliffton Asters of [facility] surgery prior to umbilical hernia repair.    History of Present Illness:  Brian Lloyd is a 57 y.o. male with PMH of antiphospholipid antibody deficiency, DVT and recurrent CVA. He was seen by sports medicine after a left lower extremity tendon injury, he developed DVT afterward in July 2017. He also developed posterior circulation stroke in September 2017. MRI showed acute and subacute lacunar infarct in the left cerebellum. 2-D echo obtained on 10/19/2015 showed mild LVH, EF 60-65%, pseudo-normal grade 2 diastolic dysfunction, medium-sized 1.4 cm x 0.6 cm aortic valve mass suggestive of possible vegetation. Carotid Doppler showed homogenous plaque in the right carotid with mild intimal thickening on the left, less than 40% bilaterally. He underwent outpatient transesophageal echocardiogram on 10/22/2015 that showed large mobile mass along the ventricular surface of aortic valve measuring 2 x 1 cm, surface was very irregular, appears to be attached to coronary cusp near the base. Suspicious for fibroblastoma, no evidence of thrombosis. I saw the patient on 10/21/2015 and the set him up for cardiac cath prior to CT surgery referral. As part of blood work workup, it was noted he has quite a lot of platelet clumping. Only using citrate tube, we were able to estimate the platelet count around 65,000. He underwent cardiac catheterization on 10/29/2015 which showed no angiographic evidence of CAD. He was evaluated by Dr. 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 The only definitive means to establish a diagnosis would be with surgical resection. Since this is noninfectious in nature, there was high probability that his aortic valve could be preserved. Due to antiphospholipid antibody syndrome, his Xarelto was stopped and switched to Coumadin.  Due to recurrent neurological symptoms involving vision and the transient memory loss, MRI obtained on 11/18/2015 showed interval resolution of punctate left cerebellar infarct, subacute cortical infarct in the anterior right frontal lobe, acute punctate cortical nonhemorrhagic infarct in the anterior left frontal lobe.  He was placed on Lovenox and his surgery date moved up.  He eventually had excision of aortic valve mass, aortic valve replacement on 11/24/2015 with [facility] Ease Pericardial Tissue Valve and CABG 2 with SVG to distal LAD and SVG to OM. Intraoperative finding include hemorrhagic vegetation densely adherent to ventricular surface of the aortic valve, numerous PMNs but no organisms seen on intraoperative Gram stain of vegetation. Moderate central aortic insufficiency after attempted valve repair eventually required valve replacement. 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 He had acute onset of diffuse ST segment elevation with hypotension and severe dysfunction of the anterior and lateral wall consistent with myocardial ischemia involving left main coronary artery at the termination of procedure prior to transport. Normal LV function at the completion of the procedure after CABG 2. Pathology of the  tissue removed shows granulation tissues and abundant fibrin.   Per hematology recommendation, he will require lifelong aspirin and Coumadin.  I last saw the patient on 12/09/2015, he was still on Lovenox with Coumadin bridge at the time.  He presents today for preoperative clearance prior to umbilical hernia repair by Dr. Cliffton [facility]stersWhite [facility].  He has not had any significant chest discomfort or shortness of breath.  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  Overall, he is recovering quite well and describes increased energy after the surgery in 2017.  He is still on low-dose aspirin and Coumadin combination.  His Coumadin level is managed by Dr. Alexandria LodgeGroce.  He is aware that he will need Lovenox bridging prior to the surgery given history of antiphospholipid antibody syndrome.  I recommended echocardiogram to make sure his aortic valve is stable, on physical exam he has a very mild heart murmur.  If his aortic valve is stable, he may proceed with umbilical hernia surgery.  We also discussed SBE prophylaxis with dental cleaning and dental procedure.  Past Medical History:  Diagnosis Date  . Acute cerebral infarction (HCC) 11/18/2015   Bifrontal embolic infarcts MRI/MRA 11/18/15; known aortic valve vegetation on lovenox/coumadin/ASA  . Antiphospholipid antibody syndrome (HCC) 11/06/2015   11/04/15 significant elevation of IgG anticardiolipin & beta-2-GP-1 antibodies  . 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 Aortic valve mass   . 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 DVT (deep venous thrombosis) (HCC)    left leg, on Xarelto  . 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 S/P aortic valve replacement with bioprosthetic valve 11/24/2015    25 mm [facility] Ease bovine pericardial tissue valve  . Thrombocytopenia (HCC)     Past Surgical History:  Procedure Laterality Date  . AORTIC VALVE REPLACEMENT N/A 11/24/2015   Procedure: AORTIC VALVE REPLACEMENT;  Surgeon: Purcell Nailslarence H Owen, MD;  [facility];  Service: Open Heart Surgery;  Laterality: N/A;  . CARDIAC CATHETERIZATION N/A 10/29/2015   Procedure: Coronary/Graft Angiography;  Surgeon: Kathleene Hazelhristopher D McAlhany, MD;  [facility];  Service: Cardiovascular;  Laterality: N/A;  . CORONARY ARTERY BYPASS GRAFT N/A 11/24/2015   Procedure: CORONARY ARTERY BYPASS GRAFTING (CABG)x 2 WITH ENDOSCOPIC HARVESTING OF RIGHT SAPHENOUS VEIN -SVG to LAD -SVG to OM1;  Surgeon: Purcell Nailslarence H Owen, MD;  [facility];  Service: Open Heart Surgery;  Laterality: N/A;  . EXCISION OF ATRIAL MYXOMA N/A 11/24/2015   Procedure: EXCISION OF AORTIC VALVE MASS ;  Surgeon: Purcell Nailslarence H Owen, MD;  Location: [facility] OR;  Service: Open Heart Surgery;  Laterality: N/A;  . HERNIA REPAIR Bilateral   . MOUTH SURGERY     root canal  . TEE WITHOUT CARDIOVERSION N/A 10/22/2015   Procedure: TRANSESOPHAGEAL ECHOCARDIOGRAM (TEE);  Surgeon: Pricilla RifflePaula V Ross, MD;  [facility];  Service: Cardiovascular;  Laterality: N/A;  . TEE WITHOUT CARDIOVERSION N/A 11/24/2015   Procedure: TRANSESOPHAGEAL ECHOCARDIOGRAM (TEE);  Surgeon: Purcell Nailslarence H Owen, MD;  [facility];  Service: Open Heart Surgery;  Laterality: N/A;  . TRANSTHORACIC ECHOCARDIOGRAM  10/09/2015   Mild LVH. EF 60-65%. Pseudo-normal  grade 2 diastolic dysfunction. 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 TEE recommended.    Current Medications: Outpatient Medications Prior to Visit  Medication Sig Dispense Refill  . acetaminophen (TYLENOL) 500 MG tablet Take 500 mg by mouth every 6 (six) hours as needed.    Marland Kitchen aspirin EC 81 MG tablet Take 81 mg by mouth daily.    . psyllium (METAMUCIL) 58.6 % packet Take 1  packet by mouth daily.    . Tetrahydrozoline HCl (VISINE OP) Apply 1 drop to eye daily as needed (dry eyes).    . warfarin (COUMADIN) 5 MG tablet 5 mg (5 mg x 1) every Mon, Wed, Fri; 7.5 mg (5 mg x 1.5) all other days 45 tablet 6  . Hydrocodone-Acetaminophen 7.5-300 MG TABS Take by mouth.     No facility-administered medications prior to visit.      Allergies:   Naproxen   Social History   Socioeconomic History  . Marital status: Married    Spouse name: Not on file  . Number of children: Not on file  . Years of education: Not on file  . Highest education level: Not on file  Occupational History  . Not on file  Social Needs  . Financial resource strain: Not on file  . Food insecurity:    Worry: Not on file    Inability: Not on file  . Transportation needs:    Medical: Not on file    Non-medical: Not on file  Tobacco Use  . Smoking status: Never Smoker  . Smokeless tobacco: Never Used  Substance and Sexual Activity  . Alcohol use: Yes    Alcohol/week: 3.0 standard drinks    Types: 1 Glasses of wine, 1 Cans of beer, 1 Shots of liquor per week    Comment: rarely  . Drug use: No  . Sexual activity: Yes  Lifestyle  . Physical activity:    Days per week: Not on file    Minutes per session: Not on file  . Stress: Not on file  Relationships  . Social connections:    Talks on phone: Not on file    Gets together: Not on file    Attends religious service: Not on file    Active member of club or organization: Not on file    Attends meetings of clubs or organizations: Not on file    Relationship status: Not on file  Other Topics Concern  . Not on file  Social History Narrative  . Not on file     Family History:  The patient's family history includes Diabetes in his paternal grandfather; Heart disease in his father, maternal grandfather, and paternal grandfather; Hypertension in his father and maternal grandfather.   ROS:   Please see the history of present illness.      ROS All other systems reviewed and are negative.   PHYSICAL EXAM:   VS:  BP 136/86   Pulse 66   Ht 5\' 8"  (1.727 m)   Wt 184 lb 3.2 oz (83.6 kg)   BMI 28.01 kg/m    GEN: Well nourished, well developed, in no acute distress  HEENT: normal  Neck: no JVD, carotid bruits, or masses Cardiac: RRR; no rubs, or gallops,no edema  1/6 murmur at RUSB Respiratory:  clear to auscultation bilaterally, normal work of breathing GI: soft, nontender, nondistended, + BS MS: no deformity or atrophy  Skin: warm and dry, no rash Neuro:  Alert and Oriented  x 3, Strength and sensation are intact Psych: euthymic mood, full affect  Wt Readings from Last 3 Encounters:  12/14/17 184 lb 3.2 oz (83.6 kg)  11/28/16 191 lb (86.6 kg)  04/28/16 191 lb 9.6 oz (86.9 kg)      Studies/Labs Reviewed:   EKG:  EKG is ordered today.  The ekg ordered today demonstrates normal sinus rhythm without significant ST-T wave changes.  Recent Labs: No results found for requested labs within last 8760 hours.   Lipid Panel No results found for: CHOL, TRIG, HDL, CHOLHDL, VLDL, LDLCALC, LDLDIRECT  Additional studies/ records that were reviewed today include:   TEE 11/24/2015  Left ventricle: Normal cavity size, wall thickness, left ventricular diastolic function and left atrial pressure. LV systolic function is normal with an EF of 60-65%. There are no obvious wall motion abnormalities.  Aortic valve: The valve is trileaflet. Mild valve thickening present. No stenosis. Trace regurgitation. Small non-mobile vegetation present on the noncoronary cusp.  Right ventricle: Normal cavity size, wall thickness and ejection fraction.  Tricuspid valve: Mild regurgitation.    ASSESSMENT:    1. Preop cardiovascular exam   2. Antiphospholipid antibody syndrome (HCC)   3. H/O: CVA (cerebrovascular accident)   4. H/O aortic valve replacement   5. Libman-Sacks endocarditis (HCC)   6. Hx of CABG      PLAN:  In order of  problems listed above:  1. Preoperative clearance: For umbilical hernia requested by Dr. Cliffton Asters of [facility] surgery.  He denies any recent chest pain or shortness of breath with exertion.  Given his history of aortic valve replacement, I would recommend a repeat echocardiogram.  If echo is normal, he may proceed with surgery.  His primary care provider has been monitoring his Coumadin level.  He will need Lovenox bridge given history of antiphospholipid antibody syndrome and recurrent CVA.  2. History of CABG: SVG to distal LAD and SVG to OM.  He denies any recent chest discomfort.  On aspirin.  3. History of Libman-Sacks endocarditis s/p aortic valve replacement: Plan to repeat echocardiogram.  4. Antiphospholipid antibody syndrome: Followed by Dr. Cyndie Chime of oncology/hematology.  He will need lifelong baby aspirin and Coumadin.  5. History of recurrent CVA: Related to above, on Coumadin.    Medication Adjustments/Labs and Tests Ordered: Current medicines are reviewed at length with the patient today.  Concerns regarding medicines are outlined above.  Medication changes, Labs and Tests ordered today are listed in the Patient Instructions below. Patient Instructions  Medication Instructions:  Your physician recommends that you continue on your current medications as directed. Please refer to the Current Medication list given to you today. If you need a refill on your cardiac medications before your next appointment, please call your pharmacy.   Lab work: None  If you have labs (blood work) drawn today and your tests are completely normal, you will receive your results only by: Marland Kitchen MyChart Message (if you have MyChart) OR . A paper copy in the mail If you have any lab test that is abnormal or we need to change your treatment, we will call you to review the results.  Testing/Procedures: Your physician has requested that you have an echocardiogram. Echocardiography is a painless  test that uses sound waves to create images of your heart. It provides your doctor with information about the size and shape of your heart and how well your heart's chambers and valves are working. This procedure takes approximately one hour. There are no restrictions  for this procedure. [address] PLEASE SCHEDULE WITHIN THE NEXT FEW DAYS TO A WEEK  Follow-Up: At [facility], you and your health needs are our priority.  As part of our continuing mission to provide you with exceptional heart care, we have created designated Provider Care Teams.  These Care Teams include your primary Cardiologist (physician) and Advanced Practice Providers (APPs -  Physician Assistants and Nurse Practitioners) who all work together to provide you with the care you need, when you need it. You will need a follow up appointment in 12 months.  Please call our office 2 months in advance to schedule this appointment.  You may see No primary care provider on file. or one of the following Advanced Practice Providers on your designated Care Team:   Theodore Demark, PA-C . Joni Reining, DNP, ANP  Any Other Special Instructions Will Be Listed Below (If Applicable). YOU MUST COMPLETE YOUR ECHO AND PENDING THOSE RESULTS WILL DETERMINE IF WE CLEAR YOU FOR SURGERY.     Ramond Dial, [place]  12/14/2017 5:54 PM    [facility] [address] [place], [place], [place]  [address] Phone: [phone]; Fax: [phone]

## 2017-12-14 NOTE — Patient Instructions (Signed)
Medication Instructions:  Your physician recommends that you continue on your current medications as directed. Please refer to the Current Medication list given to you today. If you need a refill on your cardiac medications before your next appointment, please call your pharmacy.   Lab work: None  If you have labs (blood work) drawn today and your tests are completely normal, you will receive your results only by: Marland Kitchen. MyChart Message (if you have MyChart) OR . A paper copy in the mail If you have any lab test that is abnormal or we need to change your treatment, we will call you to review the results.  Testing/Procedures: Your physician has requested that you have an echocardiogram. Echocardiography is a painless test that uses sound waves to create images of your heart. It provides your doctor with information about the size and shape of your heart and how well your heart's chambers and valves are working. This procedure takes approximately one hour. There are no restrictions for this procedure. [address] PLEASE SCHEDULE WITHIN THE NEXT FEW DAYS TO A WEEK  Follow-Up: At [facility], you and your health needs are our priority.  As part of our continuing mission to provide you with exceptional heart care, we have created designated Provider Care Teams.  These Care Teams include your primary Cardiologist (physician) and Advanced Practice Providers (APPs -  Physician Assistants and Nurse Practitioners) who all work together to provide you with the care you need, when you need it. You will need a follow up appointment in 12 months.  Please call our office 2 months in advance to schedule this appointment.  You may see No primary care provider on file. or one of the following Advanced Practice Providers on your designated Care Team:   Theodore DemarkRhonda Barrett, PA-C . Joni ReiningKathryn Lawrence, DNP, ANP  Any Other Special Instructions Will Be Listed Below (If Applicable). YOU MUST COMPLETE YOUR ECHO  AND PENDING THOSE RESULTS WILL DETERMINE IF WE CLEAR YOU FOR SURGERY.

## 2017-12-21 ENCOUNTER — Ambulatory Visit (HOSPITAL_COMMUNITY): Payer: BLUE CROSS/BLUE SHIELD | Attending: Cardiovascular Disease

## 2017-12-21 DIAGNOSIS — Z8673 Personal history of transient ischemic attack (TIA), and cerebral infarction without residual deficits: Secondary | ICD-10-CM | POA: Insufficient documentation

## 2017-12-21 DIAGNOSIS — Z952 Presence of prosthetic heart valve: Secondary | ICD-10-CM | POA: Diagnosis not present

## 2017-12-21 DIAGNOSIS — Z01810 Encounter for preprocedural cardiovascular examination: Secondary | ICD-10-CM

## 2017-12-21 DIAGNOSIS — M3211 Endocarditis in systemic lupus erythematosus: Secondary | ICD-10-CM | POA: Diagnosis present

## 2017-12-21 DIAGNOSIS — D6861 Antiphospholipid syndrome: Secondary | ICD-10-CM | POA: Insufficient documentation

## 2017-12-21 DIAGNOSIS — Z951 Presence of aortocoronary bypass graft: Secondary | ICD-10-CM

## 2017-12-24 ENCOUNTER — Encounter: Payer: Self-pay | Admitting: Physician Assistant

## 2017-12-25 ENCOUNTER — Ambulatory Visit: Payer: BLUE CROSS/BLUE SHIELD | Admitting: Pharmacist

## 2017-12-25 DIAGNOSIS — Z8673 Personal history of transient ischemic attack (TIA), and cerebral infarction without residual deficits: Secondary | ICD-10-CM

## 2017-12-25 DIAGNOSIS — I825Z2 Chronic embolism and thrombosis of unspecified deep veins of left distal lower extremity: Secondary | ICD-10-CM

## 2017-12-25 DIAGNOSIS — I63119 Cerebral infarction due to embolism of unspecified vertebral artery: Secondary | ICD-10-CM

## 2017-12-25 DIAGNOSIS — D6861 Antiphospholipid syndrome: Secondary | ICD-10-CM

## 2017-12-25 DIAGNOSIS — I639 Cerebral infarction, unspecified: Secondary | ICD-10-CM

## 2017-12-25 DIAGNOSIS — Z953 Presence of xenogenic heart valve: Secondary | ICD-10-CM

## 2017-12-25 NOTE — Progress Notes (Signed)
We MUST learn the proposed date of planned surgery. ONCE the posted surgery day/date is identified/provided--I will amend the instructions below to reflect actual days/dates/times. A copy of these proposed instructions are being faxed to the office of Dr. Cliffton [facility]stersWhite at [facility] as well as being scanned in to [facility].   ONCE we know the planned surgical day/date, we will do the following:  a. Provide you a prescription for low-molecular-weight-heparin (enoxaparin/Lovenox) to provide you 1mg  of enoxaparin/Lovenox per kilogram of body weight, to be administered every 12 hours.  b. STOP warfarin FIVE (5) days BEFORE PLANNED SURGICAL DAY/DATE c. Begin LMWH enoxaparin/Lovenox on a 7AM/7PM schedule-THREE DAYS prior to planned surgery day/date.  d. You will CONTINUE enoxaparin/Lovenox on a 7AM/7PM schedule until your LAST DOSE, which will be the MORNING DOSE, 24 hours pre-operatively. DO NOT take the evening dose the day before your surgery.  e. Day of surgery-NO LOVENOX. f. Day of surgery-Resume warfarin based upon last provided instructions from pharmacist Hulen LusterJames Carolle Ishii, PharmD, CPP g. Day AFTER surgery, re-commence enoxaparin/Lovenox 1mg /kg every 12 hours 7AM and 7PM.  h. Continue warfarin. On post-operative day 4, [facility] after you have taken 4 consecutive daily doses of warfarin.  Sharlee Blewi. ANY questions-Call Pharmacist, Hulen LusterJames Rudell Marlowe PharmD, CPP at [phone] *ONCE the posted surgery date is defined/provided-I will amend all instructions above to REFLECT the calendar days and dates.

## 2018-01-01 ENCOUNTER — Other Ambulatory Visit: Payer: Self-pay | Admitting: Oncology

## 2018-01-03 ENCOUNTER — Ambulatory Visit: Payer: Self-pay | Admitting: Surgery

## 2018-01-03 NOTE — H&P (Signed)
CC: Here for f/u re: umb hernia  HPI Mr. Towell is a 9M here for evaluation of supraumbilical bulge. Has hx of anti-phospholipid ab and an aortic valve replacement 20yr ago with Dr. Cornelius Moras using a bovine valve. He has been maintained on warfarin. He has an approximate one-year history of a reducible supraumbilical bulge. He first noticed this following his cardiac surgery. He had not noticed this prior. The bulge causes discomfort with activity and particularly lifting which he has a lot of. He denies a history of incarceration of the bulge. It is always reducible. The pain is alleviated with reduction. He denies changes in bowel habits, constipation, diarrhea, nausea/vomiting.  INTERVAL HX He returns for f/u - had initially decided to hold off but having more discomfort related to the bulge. He denies any additional history of incarceration. He denies any changes in his bowel habits. He denies any overlying skin changes. He is interested in resuming weightlifting but would like to get this addressed first.   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  Past surgical history: Bilateral open anal hernia repair many years ago using mesh. Open heart surgery as noted above.  Social: Denies use of tobacco, alcohol, drugs.  Family history: Denies family history of malignancy.  ROS: A comprehensive 10 system ROS was completed with the patient. Pertinent findings as noted above in the HPI.  The patient is a 57 year old male.   Past Surgical History Judithann Sauger, [place]; 11/16/2017 2:19 PM) Laparoscopic Inguinal Hernia Surgery  Bilateral. Open Inguinal Hernia Surgery  Bilateral. Oral Surgery  Valve Replacement   Diagnostic Studies History Judithann Sauger, RMA; 11/16/2017 2:19 PM) Colonoscopy  5-10 years ago  Allergies Judithann Sauger,  RMA; 11/16/2017 2:20 PM) Naproxen *ANALGESICS - ANTI-INFLAMMATORY*  Allergies Reconciled   Medication History Judithann Sauger, RMA; 11/16/2017 2:20 PM) Warfarin Sodium (5MG  Tablet, Oral) Active. Medications Reconciled  Social History Judithann Sauger, [place]; 11/16/2017 2:19 PM) Alcohol use  Occasional alcohol use. Caffeine use  Coffee. Tobacco use  Former smoker.  Family History Judithann Sauger, [place]; 11/16/2017 2:19 PM) Alcohol Abuse  Father. Arthritis  Mother. Heart Disease  Father. Hypertension  Father. Migraine Headache  Sister.  Other Problems Judithann Sauger, [place]; 11/16/2017 2:19 PM) Arthritis  Back Pain  Cerebrovascular Accident  Inguinal Hernia  Other disease, cancer, significant illness     Review of Systems Judithann Sauger RMA; 11/16/2017 2:19 PM) General Not Present- Appetite Loss, Chills, Fatigue, Fever, Night Sweats, Weight Gain and Weight Loss. Skin Not Present- Change in Wart/Mole, Dryness, Hives, Jaundice, New Lesions, Non-Healing Wounds, Rash and Ulcer. HEENT Present- Ringing in the Ears. Not Present- Earache, Hearing Loss, Hoarseness, Nose Bleed, Oral Ulcers, Seasonal Allergies, Sinus Pain, Sore Throat, Visual Disturbances, Wears glasses/contact lenses and Yellow Eyes. Respiratory Not Present- Bloody sputum, Chronic Cough, Difficulty Breathing, Snoring and Wheezing. Breast Not Present- Breast Mass, Breast Pain, Nipple Discharge and Skin Changes. Cardiovascular Not Present- Chest Pain, Difficulty Breathing Lying Down, Leg Cramps, Palpitations, Rapid Heart Rate, Shortness of Breath and Swelling of Extremities. Gastrointestinal Present- Bloating, Excessive gas and Hemorrhoids. Not Present- Abdominal Pain, Bloody Stool, Change in Bowel Habits, Chronic diarrhea, Constipation, Difficulty Swallowing, Gets full quickly at meals, Indigestion, Nausea, Rectal Pain and Vomiting. Male Genitourinary Not Present- Blood in Urine, Change in Urinary Stream, Frequency,  Impotence, Nocturia, Painful Urination, Urgency and Urine Leakage. Musculoskeletal Present- Joint Pain and Joint Stiffness.  Not Present- Back Pain, Muscle Pain, Muscle Weakness and Swelling of Extremities. Neurological Not Present- Decreased Memory, Fainting, Headaches, Numbness, Seizures, Tingling, Tremor, Trouble walking and Weakness. Psychiatric Not Present- Anxiety, Bipolar, Change in Sleep Pattern, Depression, Fearful and Frequent crying. Endocrine Not Present- Cold Intolerance, Excessive Hunger, Hair Changes, Heat Intolerance, Hot flashes and New Diabetes. Hematology Present- Blood Thinners and Easy Bruising. Not Present- Excessive bleeding, Gland problems, HIV and Persistent Infections.  Vitals Elease Hashimoto(Patricia King RMA; 11/16/2017 2:19 PM) 11/16/2017 2:19 PM Weight: 187.2 lb Height: 68in Body Surface Area: 1.99 m Body Mass Index: 28.46 kg/m  Temp.: 98.55F  Pulse: 98 (Regular)  BP: 128/82 (Sitting, Left Arm, Standard)       Physical Exam Cristal Deer(Chadrick Sprinkle M. Jyaire Koudelka MD; 11/16/2017 2:46 PM) The physical exam findings are as follows: Note:Constitutional: No acute distress; conversant; no deformities Eyes: Moist conjunctiva; no lid lag, anicteric pupils; pupils equal round reactive to light Neck: Trachea midline; no thyromegaly Lungs: Normal respiratory effort; no tactile fremitus CV: RRR; no palpable thrills; no pitting edema GI: Abdomen soft, NT; no palpable hepatosplenmegaly; reducible supraumbilical bulge - ~the size of my finger tip MSK: Normal gait; no clubbing/cyanosis Psych: Appropriate affect; alert and oriented x3 Lymphatic: No palpable cervical or axillary lymphadenopathy    Assessment & Plan Cristal Deer(Gerene Nedd M. Daegon Deiss MD; 11/16/2017 2:48 PM) UMBILICAL HERNIA (K42.9) Story: Mr. [name] is a very pleasant 57yoM with hx of antiphospholipid antibody syndrome, bovine valve replacement here today for follow-up regarding symptomatic umbilical hernia which is primary. He  is a nonsmoker His BMI less than 30 Impression: -We are re-requesting cardiac and hematology clearances prior to scheduling for surgery -Following clearance, will schedule for open umbilical hernia repair with mesh; all other indicated procedures. He has requested we place mesh for the purposes of additional reinforcement as he is planning on doing a fair amount of weight lifting in the coming months. We discussed that he would not be allowed or able to lift anything more than 10 pounds for 6 weeks after surgery. -The anatomy and physiology of the abdominal wall was described using pictures and diagrams. The pathophysiology of umbilical hernia was described in detail again today. The indications for surgery and surgical approaches were discussed. The planned procedure, material risks (including, but not limited to, pain, bleeding, infection, scarring, mesh complications including erosion into other structures and bowel, recurrence of hernia, hematoma, seroma, heart attack, stroke - particularly in setting of prior strokes in his case, death) benefits and alternatives were discussed. His questions were answered to his satisfaction and he elected to proceed.  Stephanie Couphristopher M. Cliffton AstersWhite, M.D. [facility]

## 2018-01-04 ENCOUNTER — Other Ambulatory Visit: Payer: Self-pay | Admitting: Oncology

## 2018-01-04 DIAGNOSIS — M3211 Endocarditis in systemic lupus erythematosus: Secondary | ICD-10-CM

## 2018-01-04 DIAGNOSIS — Z953 Presence of xenogenic heart valve: Secondary | ICD-10-CM

## 2018-01-04 DIAGNOSIS — D6861 Antiphospholipid syndrome: Secondary | ICD-10-CM

## 2018-01-04 DIAGNOSIS — Z7901 Long term (current) use of anticoagulants: Secondary | ICD-10-CM

## 2018-01-29 ENCOUNTER — Encounter (HOSPITAL_COMMUNITY)
Admission: RE | Admit: 2018-01-29 | Discharge: 2018-01-29 | Disposition: A | Discharge status: Home / Self Care | Payer: BLUE CROSS/BLUE SHIELD | Source: Ambulatory Visit | Attending: Surgery | Admitting: Surgery

## 2018-01-29 ENCOUNTER — Other Ambulatory Visit: Payer: Self-pay

## 2018-01-29 ENCOUNTER — Encounter (HOSPITAL_COMMUNITY): Payer: Self-pay

## 2018-01-29 DIAGNOSIS — Z01812 Encounter for preprocedural laboratory examination: Secondary | ICD-10-CM | POA: Diagnosis not present

## 2018-01-29 HISTORY — DX: Unspecified osteoarthritis, unspecified site: M19.90

## 2018-01-29 LAB — PROTIME-INR
INR: 1.8
Prothrombin Time: 20.7 seconds — ABNORMAL HIGH (ref 11.4–15.2)

## 2018-01-29 LAB — COMPREHENSIVE METABOLIC PANEL
ALT: 36 U/L (ref 0–44)
AST: 30 U/L (ref 15–41)
Albumin: 4.3 g/dL (ref 3.5–5.0)
Alkaline Phosphatase: 42 U/L (ref 38–126)
Anion gap: 7 (ref 5–15)
BUN: 24 mg/dL — AB (ref 6–20)
CO2: 25 mmol/L (ref 22–32)
CREATININE: 1.21 mg/dL (ref 0.61–1.24)
Calcium: 9.2 mg/dL (ref 8.9–10.3)
Chloride: 108 mmol/L (ref 98–111)
GFR calc Af Amer: >60 mL/min (ref >60)
GFR calc non Af Amer: >60 mL/min (ref >60)
Glucose, Bld: 109 mg/dL — ABNORMAL HIGH (ref 70–99)
Potassium: 4.5 mmol/L (ref 3.5–5.1)
Sodium: 140 mmol/L (ref 135–145)
Total Bilirubin: 0.7 mg/dL (ref 0.3–1.2)
Total Protein: 7.7 g/dL (ref 6.5–8.1)

## 2018-01-29 LAB — CBC WITH DIFFERENTIAL/PLATELET
ABS IMMATURE GRANULOCYTES: 0.03 10*3/uL (ref 0.00–0.07)
Basophils Absolute: 0 10*3/uL (ref 0.0–0.1)
Basophils Relative: 1 %
Eosinophils Absolute: 0.2 10*3/uL (ref 0.0–0.5)
Eosinophils Relative: 3 %
HCT: 45.3 % (ref 39.0–52.0)
HEMOGLOBIN: 15.2 g/dL (ref 13.0–17.0)
IMMATURE GRANULOCYTES: 1 %
LYMPHS ABS: 1.1 10*3/uL (ref 0.7–4.0)
Lymphocytes Relative: 19 %
MCH: 29 pg (ref 26.0–34.0)
MCHC: 33.6 g/dL (ref 30.0–36.0)
MCV: 86.3 fL (ref 80.0–100.0)
Monocytes Absolute: 0.4 10*3/uL (ref 0.1–1.0)
Monocytes Relative: 7 %
NEUTROS ABS: 4.2 10*3/uL (ref 1.7–7.7)
Neutrophils Relative %: 69 %
Platelets: 69 10*3/uL — ABNORMAL LOW (ref 150–400)
RBC: 5.25 MIL/uL (ref 4.22–5.81)
RDW: 12.2 % (ref 11.5–15.5)
Smear Review: UNDETERMINED
WBC: 5.9 10*3/uL (ref 4.0–10.5)
nRBC: 0 % (ref 0.0–0.2)

## 2018-01-29 LAB — ABO/RH: ABO/RH(D): A POS

## 2018-01-29 LAB — APTT: aPTT: 89 seconds — ABNORMAL HIGH (ref 24–36)

## 2018-01-29 NOTE — Progress Notes (Signed)
Anesthesia Chart Review    Case:  [id number] Date/Time:  02/01/18 0715   Procedures:      OPEN UMBILICAL HERNIA REPAIR WITH MESH ERAS PATHWAY (N/A )     INSERTION OF MESH (N/A )   Anesthesia type:  General   Pre-op diagnosis:  umblilical hernia   Location:  [facility] ROOM 02 / [facility]   Surgeon:  Andria Meuse, MD      DISCUSSION:57 yo never smoker with h/o antiphospholipid antibody deficiency,LLE DVT (06/2015 following left tendon rupture), recurrent CVA (currently on Coumadin), aortic valve vegetation with subsequent aortic valve replacement with bioprosthetic valve along with CABG x2  (11/24/15) scheduled for above surgery on 02/01/18 with Dr. Marin Olp.    He was seen by cardiology on 12/14/17.  Per [place], [place] note, "He denies any recent chest pain or shortness of breath with exertion.  Given his history of aortic valve replacement, I would recommend a repeat echocardiogram.  If echo is normal, he may proceed with surgery.  His primary care provider has been monitoring his Coumadin level.  He will need Lovenox bridge given history of antiphospholipid antibody syndrome and recurrent CVA."   Echo done on 12/21/17.  Per [place], [place], "Normal pumping function, moderate thickening of heart wall with some stiffness of heart wall. Bioprosthetic aortic valve functioning normally. He is cleared to proceed with surgery."   He will require lovenox bridge.  This is managed by Dr. Cyndie Chime who is aware of upcoming surgery and has given detailed lovenox bridge instructions.  Pt is to stop coumadin 5 days prior to surgery, begin lovenox 1 mg/kg 7A/7P 3 days pre-op, take AM dose 1 day pre-op; do not take PM dose, DOS no lovenox, resume coumadin that evening, resume lovenox either 1 mg/kg BID or 1.5 mg/kg single daily dose starting day after surgery. Detailed lovenox bridge instructions sent to Dr. Marin Olp and relayed to pt, confirmed with PST nurse.   During PST visit Platelets 69 with  platelet clumping noted on smear, h/o chronically low platelets. Per Dr. Patsy Lager note 04/18/16 platelet count has been difficulty to monitor due to in vitro platelet clumping. He states, "Machine count got down as low as about 24,000. I did transfuse platelets at that point but maximum count posttransfusion only 39,000 which to me demonstrated that he does have an element of immune mediated platelet destruction as well."  It appears when reviewing his labs platelets has repeatedly been unable to estimate due to clumping.    Labs have been reviewed by Dr. Marin Olp.  Will order DOS PT, APTT.   Pt can proceed with above procedure barring acute status change.  VS: BP 125/86   Pulse 72   Temp 36.8 C (Oral)   Resp 16   Ht 5\' 8"  (1.727 m)   Wt 82.6 kg   SpO2 98%   BMI 27.67 kg/m   PROVIDERS: Joycelyn Rua, MD is PCP   Cephas Darby, MD Internal Medicine (manages anticoagulation)  LABS: Labs reviewed: Acceptable for surgery. (all labs ordered are listed, but only abnormal results are displayed)  Labs Reviewed  APTT - Abnormal; Notable for the following components:      Result Value   aPTT 89 (*)    All other components within normal limits  CBC WITH DIFFERENTIAL/PLATELET - Abnormal; Notable for the following components:   Platelets 69 (*)    All other components within normal limits  COMPREHENSIVE METABOLIC PANEL - Abnormal; Notable for the following  components:   Glucose, Bld 109 (*)    BUN 24 (*)    All other components within normal limits  PROTIME-INR - Abnormal; Notable for the following components:   Prothrombin Time 20.7 (*)    All other components within normal limits  TYPE AND SCREEN  ABO/RH     IMAGES:   EKG 12/14/2017: Normal sinus rhythm Normal ECG  CV:  Echo 12/21/17 Study Conclusions  - Left ventricle: The cavity size was normal. Wall thickness was   increased in a pattern of moderate LVH. Systolic function was   normal. The  estimated ejection fraction was in the range of 60%   to 65%. Wall motion was normal; there were no regional wall   motion abnormalities. Features are consistent with a pseudonormal   left ventricular filling pattern, with concomitant abnormal   relaxation and increased filling pressure (grade 2 diastolic   dysfunction). - Aortic valve: A bioprosthesis was present. Valve area (VTI): 2.42   cm^2. Valve area (Vmax): 2.13 cm^2. Valve area (Vmean): 2.21   cm^2. - Right ventricle: The cavity size was mildly dilated. - Right atrium: The atrium was mildly dilated. - Pulmonary arteries: Systolic pressure was mildly increased. PA   peak pressure: 35 mm Hg (S).  Past Medical History:  Diagnosis Date  . Acute cerebral infarction (HCC) 11/18/2015   Bifrontal embolic infarcts MRI/MRA 11/18/15; known aortic valve vegetation on lovenox/coumadin/ASA  . Antiphospholipid antibody syndrome (HCC) 11/06/2015   11/04/15 significant elevation of IgG anticardiolipin & beta-2-GP-1 antibodies  . 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 Aortic valve mass   . 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 Arthritis    hands  . 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 DVT (deep venous thrombosis) (HCC)    left leg, on Xarelto  . 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 S/P aortic valve replacement with bioprosthetic valve 11/24/2015   [facility] bovine pericardial tissue valve  . Thrombocytopenia (HCC)     Past Surgical History:  Procedure Laterality Date  . AORTIC VALVE REPLACEMENT N/A 11/24/2015   Procedure: AORTIC VALVE REPLACEMENT;  Surgeon: Purcell Nailslarence H Owen, MD;  [facility];  Service: Open Heart Surgery;  Laterality: N/A;  . CARDIAC CATHETERIZATION N/A 10/29/2015   Procedure: Coronary/Graft Angiography;   Surgeon: Kathleene Hazelhristopher D McAlhany, MD;  [facility];  Service: Cardiovascular;  Laterality: N/A;  . CORONARY ARTERY BYPASS GRAFT N/A 11/24/2015   Procedure: CORONARY ARTERY BYPASS GRAFTING (CABG)x 2 WITH ENDOSCOPIC HARVESTING OF RIGHT SAPHENOUS VEIN -SVG to LAD -SVG to OM1;  Surgeon: Purcell Nailslarence H Owen, MD;  [facility];  Service: Open Heart Surgery;  Laterality: N/A;  . EXCISION OF ATRIAL MYXOMA N/A 11/24/2015   Procedure: EXCISION OF AORTIC VALVE MASS ;  Surgeon: Purcell Nailslarence H Owen, MD;  Location: [facility] OR;  Service: Open Heart Surgery;  Laterality: N/A;  . HERNIA REPAIR Bilateral    inguinal  . MOUTH SURGERY     root canal  . TEE WITHOUT CARDIOVERSION N/A 10/22/2015   Procedure: TRANSESOPHAGEAL ECHOCARDIOGRAM (TEE);  Surgeon: Pricilla RifflePaula V Ross, MD;  [facility];  Service: Cardiovascular;  Laterality: N/A;  . TEE WITHOUT CARDIOVERSION N/A 11/24/2015   Procedure: TRANSESOPHAGEAL ECHOCARDIOGRAM (TEE);  Surgeon: Purcell Nailslarence H Owen, MD;  [facility];  Service: Open Heart Surgery;  Laterality: N/A;  . TRANSTHORACIC ECHOCARDIOGRAM  10/09/2015   Mild LVH. EF 60-65%. Pseudo-normal grade 2 diastolic dysfunction. 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 TEE recommended.    MEDICATIONS: . acetaminophen (TYLENOL) 500 MG tablet  . aspirin EC 81 MG tablet  . Multiple Vitamins-Minerals (MULTIVITAMIN PO)  . psyllium (METAMUCIL) 58.6 % packet  . warfarin (COUMADIN) 5 MG tablet   No current facility-administered medications for this encounter.      Janey GentaJessica Shell Yandow, PA-C [facility] [phone] 01/29/18 3:59 PM

## 2018-01-29 NOTE — Patient Instructions (Addendum)
Laurina BustleWayne Counts  01/29/2018   Your procedure is scheduled on: 02-01-18  [facility]               Report to admitting at      0530 AM    Call this number if you have problems the morning of surgery [phone]    Remember: Do not eat food or drink liquids :After Midnight.  BRUSH YOUR TEETH MORNING OF SURGERY AND RINSE YOUR MOUTH OUT, NO CHEWING GUM CANDY OR MINTS.     Take these medicines the morning of surgery with A SIP OF WATER: NONE                                You may not have any metal on your body including hair pins and              piercings  Do not wear jewelry,  lotions, powders or perfumes, deodorant                   Men may shave face and neck.   Do not bring valuables to the hospital. [facility] IS NOT             RESPONSIBLE   FOR VALUABLES.  Contacts, dentures or bridgework may not be worn into surgery.       Patients discharged the day of surgery will not be allowed to drive home.  Name and phone number of your driver:  Special Instructions: N/A              Please read over the following fact sheets you were given: _____________________________________________________________________           [facility] - Preparing for Surgery Before surgery, you can play an important role.  Because skin is not sterile, your skin needs to be as free of germs as possible.  You can reduce the number of germs on your skin by washing with CHG (chlorahexidine gluconate) soap before surgery.  CHG is an antiseptic cleaner which kills germs and bonds with the skin to continue killing germs even after washing. Please DO NOT use if you have an allergy to CHG or antibacterial soaps.  If your skin becomes reddened/irritated stop using the CHG and inform your nurse when you arrive at Short Stay. Do not shave (including legs and underarms) for at least 48 hours prior to the first CHG shower.  You may shave your face/neck. Please  follow these instructions carefully:  1.  Shower with CHG Soap the night before surgery and the  morning of Surgery.  2.  If you choose to wash your hair, wash your hair first as usual with your  normal  shampoo.  3.  After you shampoo, rinse your hair and body thoroughly to remove the  shampoo.                           4.  Use CHG as you would any other liquid soap.  You can apply chg directly  to the skin and wash                       Gently with a scrungie or clean washcloth.  5.  Apply the CHG Soap to  your body ONLY FROM THE NECK DOWN.   Do not use on face/ open                           Wound or open sores. Avoid contact with eyes, ears mouth and genitals (private parts).                       Wash face,  Genitals (private parts) with your normal soap.             6.  Wash thoroughly, paying special attention to the area where your surgery  will be performed.  7.  Thoroughly rinse your body with warm water from the neck down.  8.  DO NOT shower/wash with your normal soap after using and rinsing off  the CHG Soap.                9.  Pat yourself dry with a clean towel.            10.  Wear clean pajamas.            11.  Place clean sheets on your bed the night of your first shower and do not  sleep with pets. Day of Surgery : Do not apply any lotions/deodorants the morning of surgery.  Please wear clean clothes to the hospital/surgery center.  FAILURE TO FOLLOW THESE INSTRUCTIONS MAY RESULT IN THE CANCELLATION OF YOUR SURGERY PATIENT SIGNATURE_________________________________  NURSE SIGNATURE__________________________________  ________________________________________________________________________

## 2018-01-29 NOTE — Progress Notes (Signed)
Clearance 12-14-17 Azalee Course[place]eng [place]ao, [place] 12-14-17 epic  Echo 12-21-17 epic, mentions cleared for surgery in echo report.  lov cards 12-14-17 epic

## 2018-01-29 NOTE — Progress Notes (Signed)
PCP: Joycelyn RuaStephen Lloyd  CARDIOLOGIST: Bryan Lemmaavid Harding  INFO IN Epic: PLT,BUN,PTT,PT  INFO ON CHART:  BLOOD THINNERS AND LAST DOSES:   Coumadin with Lovenox bridge ____________________________________  PATIENT SYMPTOMS AT TIME OF PREOP:none

## 2018-01-30 NOTE — Anesthesia Preprocedure Evaluation (Addendum)
Anesthesia Evaluation  Patient identified by MRN, date of birth, ID band Patient awake    Reviewed: Allergy & Precautions, NPO status , Patient's Chart, lab work & pertinent test results  Airway Mallampati: II  TM Distance: >3 FB Neck ROM: Full    Dental no notable dental hx. (+) Teeth Intact, Dental Advisory Given   Pulmonary neg pulmonary ROS,    Pulmonary exam normal breath sounds clear to auscultation       Cardiovascular hypertension, Normal cardiovascular exam Rhythm:Regular Rate:Normal  Echo 12/21/17 Left ventricle: The cavity size was normal. Wall thickness was   increased in a pattern of moderate LVH. Systolic function was   normal. The estimated ejection fraction was in the range of 60%   to 65%. Wall motion was normal; there were no regional wall   motion abnormalities. Features are consistent with a pseudonormal   left ventricular filling pattern, with concomitant abnormal   relaxation and increased filling pressure (grade 2 diastolic   dysfunction).   Neuro/Psych Pt on chronic anti phospholipid antibody CVAx3 CVA    GI/Hepatic Neg liver ROS,   Endo/Other  negative endocrine ROS  Renal/GU negative Renal ROS     Musculoskeletal  (+) Arthritis ,   Abdominal   Peds  Hematology negative hematology ROS (+)   Anesthesia Other Findings   Reproductive/Obstetrics                            Anesthesia Physical Anesthesia Plan  ASA: III  Anesthesia Plan: General   Post-op Pain Management:    Induction: Intravenous  PONV Risk Score and Plan: 2 and Treatment may vary due to age or medical condition, Ondansetron and Dexamethasone  Airway Management Planned: Oral ETT  Additional Equipment:   Intra-op Plan:   Post-operative Plan: Extubation in OR  Informed Consent: I have reviewed the patients History and Physical, chart, labs and discussed the procedure including the risks,  benefits and alternatives for the proposed anesthesia with the patient or authorized representative who has indicated his/her understanding and acceptance.   Dental advisory given  Plan Discussed with: CRNA  Anesthesia Plan Comments: (See PST note 01/29/18, Jodell CiproJessica Zanetto, PA-C)      Anesthesia Quick Evaluation

## 2018-01-31 MED ORDER — BUPIVACAINE LIPOSOME 1.3 % IJ SUSP
20.0000 mL | INTRAMUSCULAR | Status: DC
  Filled 2018-01-31: qty 20

## 2018-02-01 ENCOUNTER — Ambulatory Visit (HOSPITAL_COMMUNITY): Payer: BLUE CROSS/BLUE SHIELD | Admitting: Physician Assistant

## 2018-02-01 ENCOUNTER — Encounter (HOSPITAL_COMMUNITY)
Admission: RE | Disposition: A | Discharge status: Home Health | Payer: Self-pay | Source: Home / Self Care | Attending: Surgery

## 2018-02-01 ENCOUNTER — Ambulatory Visit (HOSPITAL_COMMUNITY)
Admission: RE | Admit: 2018-02-01 | Discharge: 2018-02-01 | Disposition: A | Discharge status: Home Health | Payer: BLUE CROSS/BLUE SHIELD | Attending: Surgery | Admitting: Surgery

## 2018-02-01 ENCOUNTER — Encounter (HOSPITAL_COMMUNITY): Payer: Self-pay | Admitting: *Deleted

## 2018-02-01 ENCOUNTER — Other Ambulatory Visit: Payer: Self-pay

## 2018-02-01 DIAGNOSIS — Z8673 Personal history of transient ischemic attack (TIA), and cerebral infarction without residual deficits: Secondary | ICD-10-CM | POA: Insufficient documentation

## 2018-02-01 DIAGNOSIS — Z7901 Long term (current) use of anticoagulants: Secondary | ICD-10-CM | POA: Insufficient documentation

## 2018-02-01 DIAGNOSIS — K429 Umbilical hernia without obstruction or gangrene: Secondary | ICD-10-CM | POA: Insufficient documentation

## 2018-02-01 DIAGNOSIS — Z953 Presence of xenogenic heart valve: Secondary | ICD-10-CM | POA: Insufficient documentation

## 2018-02-01 DIAGNOSIS — Z951 Presence of aortocoronary bypass graft: Secondary | ICD-10-CM | POA: Diagnosis not present

## 2018-02-01 DIAGNOSIS — D6861 Antiphospholipid syndrome: Secondary | ICD-10-CM | POA: Diagnosis not present

## 2018-02-01 DIAGNOSIS — Z86718 Personal history of other venous thrombosis and embolism: Secondary | ICD-10-CM | POA: Diagnosis not present

## 2018-02-01 DIAGNOSIS — I10 Essential (primary) hypertension: Secondary | ICD-10-CM | POA: Diagnosis not present

## 2018-02-01 HISTORY — PX: UMBILICAL HERNIA REPAIR: SHX196

## 2018-02-01 LAB — TYPE AND SCREEN
ABO/RH(D): A POS
Antibody Screen: NEGATIVE

## 2018-02-01 LAB — PROTIME-INR
INR: 0.99
PROTHROMBIN TIME: 13 s (ref 11.4–15.2)

## 2018-02-01 LAB — APTT: APTT: 90 s — AB (ref 24–36)

## 2018-02-01 SURGERY — REPAIR, HERNIA, UMBILICAL, ADULT
Anesthesia: General | Site: Abdomen

## 2018-02-01 MED ORDER — ONDANSETRON HCL 4 MG/2ML IJ SOLN
INTRAMUSCULAR | Status: DC | PRN
  Administered 2018-02-01: 4 mg via INTRAVENOUS

## 2018-02-01 MED ORDER — PROPOFOL 10 MG/ML IV BOLUS
INTRAVENOUS | Status: DC | PRN
  Administered 2018-02-01: 150 mg via INTRAVENOUS

## 2018-02-01 MED ORDER — TRAMADOL HCL 50 MG PO TABS: 50.0000 mg | ORAL_TABLET | Freq: Four times a day (QID) | ORAL | 0 refills | Status: AC | PRN

## 2018-02-01 MED ORDER — EPHEDRINE 5 MG/ML INJ
INTRAVENOUS | Status: AC
  Filled 2018-02-01: qty 10

## 2018-02-01 MED ORDER — ROCURONIUM BROMIDE 10 MG/ML (PF) SYRINGE
PREFILLED_SYRINGE | INTRAVENOUS | Status: AC
  Filled 2018-02-01: qty 10

## 2018-02-01 MED ORDER — GABAPENTIN 300 MG PO CAPS
ORAL_CAPSULE | ORAL | Status: AC
  Administered 2018-02-01: 300 mg via ORAL
  Filled 2018-02-01: qty 1

## 2018-02-01 MED ORDER — LIDOCAINE 2% (20 MG/ML) 5 ML SYRINGE
INTRAMUSCULAR | Status: DC | PRN
  Administered 2018-02-01: 100 mg via INTRAVENOUS

## 2018-02-01 MED ORDER — HYDROCODONE-ACETAMINOPHEN 7.5-325 MG PO TABS: 1.0000 | ORAL_TABLET | Freq: Once | ORAL | Status: DC | PRN

## 2018-02-01 MED ORDER — MIDAZOLAM HCL 2 MG/2ML IJ SOLN
INTRAMUSCULAR | Status: AC
  Filled 2018-02-01: qty 2

## 2018-02-01 MED ORDER — ROCURONIUM BROMIDE 10 MG/ML (PF) SYRINGE
PREFILLED_SYRINGE | INTRAVENOUS | Status: DC | PRN
  Administered 2018-02-01: 50 mg via INTRAVENOUS

## 2018-02-01 MED ORDER — DEXAMETHASONE SODIUM PHOSPHATE 10 MG/ML IJ SOLN
INTRAMUSCULAR | Status: DC | PRN
  Administered 2018-02-01: 10 mg via INTRAVENOUS

## 2018-02-01 MED ORDER — BUPIVACAINE LIPOSOME 1.3 % IJ SUSP
INTRAMUSCULAR | Status: DC | PRN
  Administered 2018-02-01: 15 mL

## 2018-02-01 MED ORDER — DEXAMETHASONE SODIUM PHOSPHATE 10 MG/ML IJ SOLN
INTRAMUSCULAR | Status: AC
  Filled 2018-02-01: qty 1

## 2018-02-01 MED ORDER — PROPOFOL 10 MG/ML IV BOLUS
INTRAVENOUS | Status: AC
  Filled 2018-02-01: qty 20

## 2018-02-01 MED ORDER — BUPIVACAINE-EPINEPHRINE (PF) 0.25% -1:200000 IJ SOLN
INTRAMUSCULAR | Status: AC
  Filled 2018-02-01: qty 30

## 2018-02-01 MED ORDER — LIDOCAINE 2% (20 MG/ML) 5 ML SYRINGE
INTRAMUSCULAR | Status: AC
  Filled 2018-02-01: qty 5

## 2018-02-01 MED ORDER — LACTATED RINGERS IV SOLN
INTRAVENOUS | Status: DC
  Administered 2018-02-01: 06:00:00 via INTRAVENOUS

## 2018-02-01 MED ORDER — MIDAZOLAM HCL 5 MG/5ML IJ SOLN
INTRAMUSCULAR | Status: DC | PRN
  Administered 2018-02-01: 2 mg via INTRAVENOUS

## 2018-02-01 MED ORDER — PROMETHAZINE HCL 25 MG/ML IJ SOLN: 6.2500 mg | INTRAMUSCULAR | Status: DC | PRN

## 2018-02-01 MED ORDER — FENTANYL CITRATE (PF) 100 MCG/2ML IJ SOLN
INTRAMUSCULAR | Status: DC | PRN
  Administered 2018-02-01 (×2): 50 ug via INTRAVENOUS

## 2018-02-01 MED ORDER — FENTANYL CITRATE (PF) 100 MCG/2ML IJ SOLN
INTRAMUSCULAR | Status: AC
  Filled 2018-02-01: qty 2

## 2018-02-01 MED ORDER — CHLORHEXIDINE GLUCONATE CLOTH 2 % EX PADS: 6.0000 | MEDICATED_PAD | Freq: Once | CUTANEOUS | Status: DC

## 2018-02-01 MED ORDER — ONDANSETRON HCL 4 MG/2ML IJ SOLN
INTRAMUSCULAR | Status: AC
  Filled 2018-02-01: qty 2

## 2018-02-01 MED ORDER — MEPERIDINE HCL 50 MG/ML IJ SOLN: 6.2500 mg | INTRAMUSCULAR | Status: DC | PRN

## 2018-02-01 MED ORDER — SUGAMMADEX SODIUM 200 MG/2ML IV SOLN
INTRAVENOUS | Status: AC
  Filled 2018-02-01: qty 2

## 2018-02-01 MED ORDER — BUPIVACAINE-EPINEPHRINE 0.25% -1:200000 IJ SOLN
INTRAMUSCULAR | Status: DC | PRN
  Administered 2018-02-01: 25 mL

## 2018-02-01 MED ORDER — EPHEDRINE SULFATE-NACL 50-0.9 MG/10ML-% IV SOSY
PREFILLED_SYRINGE | INTRAVENOUS | Status: DC | PRN
  Administered 2018-02-01: 5 mg via INTRAVENOUS

## 2018-02-01 MED ORDER — CEFAZOLIN SODIUM-DEXTROSE 2-4 GM/100ML-% IV SOLN
INTRAVENOUS | Status: AC
  Filled 2018-02-01: qty 100

## 2018-02-01 MED ORDER — ACETAMINOPHEN 10 MG/ML IV SOLN: 1000.0000 mg | Freq: Once | INTRAVENOUS | Status: DC | PRN

## 2018-02-01 MED ORDER — SUCCINYLCHOLINE CHLORIDE 200 MG/10ML IV SOSY
PREFILLED_SYRINGE | INTRAVENOUS | Status: AC
  Filled 2018-02-01: qty 10

## 2018-02-01 MED ORDER — ACETAMINOPHEN 500 MG PO TABS
1000.0000 mg | ORAL_TABLET | ORAL | Status: AC
  Administered 2018-02-01: 1000 mg via ORAL

## 2018-02-01 MED ORDER — 0.9 % SODIUM CHLORIDE (POUR BTL) OPTIME
TOPICAL | Status: DC | PRN
  Administered 2018-02-01: 1000 mL

## 2018-02-01 MED ORDER — ACETAMINOPHEN 500 MG PO TABS
ORAL_TABLET | ORAL | Status: AC
  Administered 2018-02-01: 1000 mg via ORAL
  Filled 2018-02-01: qty 2

## 2018-02-01 MED ORDER — GABAPENTIN 300 MG PO CAPS
300.0000 mg | ORAL_CAPSULE | ORAL | Status: AC
  Administered 2018-02-01: 300 mg via ORAL

## 2018-02-01 MED ORDER — HYDROMORPHONE HCL 1 MG/ML IJ SOLN: 0.2500 mg | INTRAMUSCULAR | Status: DC | PRN

## 2018-02-01 MED ORDER — CEFAZOLIN SODIUM-DEXTROSE 2-4 GM/100ML-% IV SOLN
2.0000 g | INTRAVENOUS | Status: AC
  Administered 2018-02-01: 2 g via INTRAVENOUS

## 2018-02-01 SURGICAL SUPPLY — 26 items
CHLORAPREP W/TINT 26ML (MISCELLANEOUS) ×3 IMPLANT
COVER SURGICAL LIGHT HANDLE (MISCELLANEOUS) ×3 IMPLANT
COVER WAND RF STERILE (DRAPES) ×1 IMPLANT
DECANTER SPIKE VIAL GLASS SM (MISCELLANEOUS) ×3 IMPLANT
DERMABOND ADVANCED (GAUZE/BANDAGES/DRESSINGS) ×1
DERMABOND ADVANCED .7 DNX12 (GAUZE/BANDAGES/DRESSINGS) ×2 IMPLANT
DRAPE LAPAROSCOPIC ABDOMINAL (DRAPES) ×3 IMPLANT
DRSG TEGADERM 2-3/8X2-3/4 SM (GAUZE/BANDAGES/DRESSINGS) ×1 IMPLANT
GAUZE SPONGE 2X2 8PLY STRL LF (GAUZE/BANDAGES/DRESSINGS) IMPLANT
GLOVE BIO SURGEON STRL SZ7.5 (GLOVE) ×3 IMPLANT
GLOVE INDICATOR 8.0 STRL GRN (GLOVE) ×3 IMPLANT
GOWN STRL REUS W/TWL XL LVL3 (GOWN DISPOSABLE) ×6 IMPLANT
KIT BASIN OR (CUSTOM PROCEDURE TRAY) ×3 IMPLANT
NDL SAFETY ECLIPSE 18X1.5 (NEEDLE) IMPLANT
NEEDLE HYPO 18GX1.5 SHARP (NEEDLE) ×1
NEEDLE HYPO 22GX1.5 SAFETY (NEEDLE) IMPLANT
PACK GENERAL/GYN (CUSTOM PROCEDURE TRAY) ×3 IMPLANT
SPONGE GAUZE 2X2 STER 10/PKG (GAUZE/BANDAGES/DRESSINGS) ×1
SPONGE LAP 18X18 RF (DISPOSABLE) IMPLANT
SUT ETHIBOND 0 MO6 C/R (SUTURE) ×3 IMPLANT
SUT MNCRL AB 4-0 PS2 18 (SUTURE) ×1 IMPLANT
SUT VIC AB 3-0 SH 27 (SUTURE) ×1
SUT VIC AB 3-0 SH 27X BRD (SUTURE) IMPLANT
SYR 20CC LL (SYRINGE) IMPLANT
TOWEL OR 17X26 10 PK STRL BLUE (TOWEL DISPOSABLE) ×3 IMPLANT
TOWEL OR NON WOVEN STRL DISP B (DISPOSABLE) ×3 IMPLANT

## 2018-02-01 NOTE — Transfer of Care (Signed)
Immediate Anesthesia Transfer of Care Note  Patient: [facility]  Procedure(s) Performed: OPEN UMBILICAL HERNIA REPAIR ERAS PATHWAY (N/A Abdomen)  Patient Location: PACU  Anesthesia Type:General  Level of Consciousness: sedated  Airway & Oxygen Therapy: Patient Spontanous Breathing and Patient connected to face mask oxygen  Post-op Assessment: Report given to RN and Post -op Vital signs reviewed and stable  Post vital signs: Reviewed and stable  Last Vitals:  Vitals Value Taken Time  BP    Temp    Pulse    Resp    SpO2      Last Pain:  Vitals:   02/01/18 0554  TempSrc: Oral  PainSc: 3       Patients Stated Pain Goal: 4 (02/01/18 0554)  Complications: No apparent anesthesia complications

## 2018-02-01 NOTE — Anesthesia Procedure Notes (Signed)
Procedure Name: Intubation Date/Time: 02/01/2018 7:29 AM Performed by: Lind Covert, CRNA Pre-anesthesia Checklist: Patient identified, Emergency Drugs available, Suction available, Patient being monitored and Timeout performed Patient Re-evaluated:Patient Re-evaluated prior to induction Oxygen Delivery Method: Circle system utilized Preoxygenation: Pre-oxygenation with 100% oxygen Induction Type: IV induction Ventilation: Mask ventilation without difficulty Laryngoscope Size: Mac and 4 Grade View: Grade I Tube type: Oral Tube size: 7.5 mm Number of attempts: 1 Airway Equipment and Method: Stylet Placement Confirmation: ETT inserted through vocal cords under direct vision,  positive ETCO2 and breath sounds checked- equal and bilateral Secured at: 22 cm Tube secured with: Tape Dental Injury: Teeth and Oropharynx as per pre-operative assessment

## 2018-02-01 NOTE — H&P (Signed)
CC: Here for surgery - umbilical hernia repair  HPI Brian Lloyd is a 82M here for repair of supraumbilical bulge. Has hx of anti-phospholipid antibody and an aortic valve replacement 34yr ago with Dr. Cornelius Moras using a bovine valve. He has been maintained on warfarin. He has an approximate one-year history of a reducible supraumbilical bulge. He first noticed this following his cardiac surgery. He had not noticed this prior. The bulge causes discomfort with activity and particularly lifting which he has a lot of. He denies a history of incarceration of the bulge. It is always reducible. The pain is alleviated with reduction. He denies changes in bowel habits, constipation, diarrhea, nausea/vomiting.   He returned for f/u - had initially decided to hold off but began having more discomfort related to the bulge. He denies any history of incarceration. He denies any changes in his bowel habits. He denies any overlying skin changes. He is interested in resuming weightlifting but would like to get this addressed first.   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  Past surgical history: Bilateral open anal hernia repair many years ago using mesh. Open heart surgery as noted above.  Social: Denies use of tobacco, alcohol, drugs.  Family history: Denies family history of malignancy.  ROS: A comprehensive 10 system ROS was completed with the patient. Pertinent findings as noted above in the HPI.  Past Medical History:  Diagnosis Date  . Acute cerebral infarction (HCC) 11/18/2015   Bifrontal embolic infarcts MRI/MRA 11/18/15; known aortic valve vegetation on lovenox/coumadin/ASA  . Antiphospholipid antibody syndrome (HCC) 11/06/2015   11/04/15 significant elevation of IgG anticardiolipin & beta-2-GP-1 antibodies  . 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 Aortic valve mass   . 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 Arthritis    hands  . 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 DVT (deep venous thrombosis) (HCC)    left leg, on Xarelto  . 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 S/P aortic valve replacement with bioprosthetic valve 11/24/2015   25 mm [facility] Ease bovine pericardial tissue valve  . Thrombocytopenia (HCC)     Past Surgical History:  Procedure Laterality Date  . AORTIC VALVE REPLACEMENT N/A 11/24/2015   Procedure: AORTIC VALVE REPLACEMENT;  Surgeon: Purcell Nails, MD;  Location: [facility] OR;  Service: Open Heart Surgery;  Laterality: N/A;  . CARDIAC CATHETERIZATION N/A 10/29/2015   Procedure: Coronary/Graft Angiography;  Surgeon: Kathleene Hazel, MD;  Location[facility] INVASIVE CV LAB;  Service: Cardiovascular;  Laterality: N/A;  . CORONARY ARTERY BYPASS GRAFT N/A 11/24/2015   Procedure: CORONARY ARTERY BYPASS GRAFTING (CABG)x 2 WITH ENDOSCOPIC HARVESTING OF RIGHT SAPHENOUS VEIN -SVG to LAD -SVG to OM1;  Surgeon: Purcell Nails, MD;  Location: [facility] OR;  Service: Open Heart Surgery;  Laterality: N/A;  . EXCISION OF ATRIAL MYXOMA N/A 11/24/2015   Procedure: EXCISION OF AORTIC VALVE MASS ;  Surgeon: Purcell Nails, MD;  Location: [facility] OR;  Service: Open Heart Surgery;  Laterality: N/A;  . HERNIA REPAIR Bilateral    inguinal  . MOUTH SURGERY     root canal  . TEE WITHOUT CARDIOVERSION N/A 10/22/2015   Procedure: TRANSESOPHAGEAL  ECHOCARDIOGRAM (TEE);  Surgeon: Pricilla RifflePaula V Ross, MD;  [facility];  Service: Cardiovascular;  Laterality: N/A;  . TEE WITHOUT CARDIOVERSION N/A 11/24/2015   Procedure: TRANSESOPHAGEAL ECHOCARDIOGRAM (TEE);  Surgeon: Purcell Nailslarence H Owen, MD;  [facility];  Service: Open Heart Surgery;  Laterality: N/A;   . TRANSTHORACIC ECHOCARDIOGRAM  10/09/2015   Mild LVH. EF 60-65%. Pseudo-normal grade 2 diastolic dysfunction. 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 TEE recommended.    Family History  Problem Relation Age of Onset  . Heart disease Father   . Hypertension Father   . Heart disease Maternal Grandfather   . Hypertension Maternal Grandfather   . Diabetes Paternal Grandfather   . Heart disease Paternal Grandfather     Social:  reports that he has never smoked. He has never used smokeless tobacco. He reports previous alcohol use of about 3.0 standard drinks of alcohol per week. He reports that he does not use drugs.  Allergies:  Allergies  Allergen Reactions  . Naproxen Anaphylaxis    Medications: I have reviewed the patient's current medications.  No results found for this or any previous visit (from the past 48 hour(s)).  No results found.  ROS - all of the below systems have been reviewed with the patient and positives are indicated with bold text General: chills, fever or night sweats Eyes: blurry vision or double vision ENT: epistaxis or sore throat Allergy/Immunology: itchy/watery eyes or nasal congestion Hematologic/Lymphatic: bleeding problems, blood clots or swollen lymph nodes Endocrine: temperature intolerance or unexpected weight changes Breast: new or changing breast lumps or nipple discharge Resp: cough, shortness of breath, or wheezing CV: chest pain or dyspnea on exertion GI: as per HPI GU: dysuria, trouble voiding, or hematuria MSK: joint pain or joint stiffness Neuro: TIA or stroke symptoms Derm: pruritus and skin lesion changes Psych: anxiety and depression  PE Blood pressure (!) 135/96, pulse 69, temperature 97.7 F (36.5 C), temperature source Oral, resp. rate 16, height 5\' 8"  (1.727 m), weight 82.6 kg. Constitutional: NAD; conversant; no deformities Eyes: Moist conjunctiva; no lid lag; anicteric;  PERRL Neck: Trachea midline; no thyromegaly Lungs: Normal respiratory effort; no tactile fremitus CV: RRR; no palpable thrills; no pitting edema GI: Abdomen soft, NT; no palpable hepatosplenmegaly; reducible supraumbilical bulge - ~the size of my finger tip MSK: Normal gait; no clubbing/cyanosis Psychiatric: Appropriate affect; alert and oriented x3 Lymphatic: No palpable cervical or axillary lymphadenopathy  A/P: [name] is a very pleasant 57yoM with hx of antiphospholipid antibody syndrome, bovine valve replacement here today for follow-up regarding symptomatic umbilical hernia which is primary.  He is a nonsmoker His BMI less than 30  -Received cardiac clearance and plans to restart lovenox 02/03/17 -Will plan for open umbilical hernia repair with possible mesh; all other indicated procedures. If <fingertip size, has opted for primary repair without mesh; if larger, requests we repair with mesh. We discussed that he would not be allowed or able to lift anything more than 10 pounds for 6 weeks after surgery. -The anatomy and physiology of the abdominal wall was described using pictures and diagrams. The pathophysiology of umbilical hernia was described in detail again today. The indications for surgery and surgical approaches were discussed. The planned procedure, material risks (including, but not limited to, pain, bleeding, infection, scarring, mesh complications including erosion into other structures and bowel, recurrence of hernia, hematoma, seroma, heart attack, stroke - particularly in setting of prior strokes in his case, death)  benefits and alternatives were discussed. His questions were answered to his satisfaction and he elected to proceed.  Stephanie Couphristopher M. Cliffton AstersWhite, M.D. [facility]

## 2018-02-01 NOTE — Op Note (Signed)
02/01/2018  8:15 AM  PATIENT:  Brian Lloyd  58 y.o. male  Patient Care Team: Joycelyn RuaMeyers, Stephen, MD as PCP - General (Family Medicine) Purcell Nailswen, Clarence H, MD as Consulting Physician (Cardiothoracic Surgery) Inez CatalinaMullen, Emily B, MD as Consulting Physician (Internal Medicine) Levert FeinsteinGranfortuna, James M, MD as Consulting Physician (Oncology) Marykay LexHarding, David W, MD as Consulting Physician (Cardiology)  PRE-OPERATIVE DIAGNOSIS:  Symptomatic umblilical hernia  POST-OPERATIVE DIAGNOSIS:  Same  PROCEDURE:  Open umbilical hernia repair  SURGEON:  Stephanie Couphristopher M. Handsome Anglin, MD  ASSISTANT: OR staff  ANESTHESIA:   general  COUNTS:  Sponge, needle and instrument counts were reported correct x2 at the conclusion of the operation.  EBL: 2 cc  DRAINS: None  SPECIMEN: None  COMPLICATIONS: None  FINDINGS: Small umbilical hernia  - just above stalk; measured 8 x 8 mm in size. Given its tiny size, this was repaired primarily using 0 Ethibond suture.  DISPOSITION: PACU in satisfactory condition  INDICATION: [name] is a 56M here for repair of supraumbilical bulge. Has hx of anti-phospholipid antibody and an aortic valve replacement 1342yr ago with Dr. Cornelius Moraswen using a bovine valve. He has been maintained on warfarin. He has an approximate one-year history of a reducible supraumbilical bulge. He first noticed this following his cardiac surgery. He had not noticed this prior. The bulge causes discomfort with activity and particularly lifting which he has a lot of. He denies a history of incarceration of the bulge. It is always reducible. The pain is alleviated with reduction. He denies changes in bowel habits, constipation, diarrhea, nausea/vomiting.   He returned for f/u - had initially decided to hold off but began having more discomfort related to the bulge. He denies any history of incarceration. He denies any changes in his bowel habits. He denies any overlying skin changes. He is interested in  resuming weightlifting but would like to get this addressed first.  Options were discussed moving forward and he opted to undergo repair of his umbilical hernia - please refer to H&P for details regarding this discussion.  We had a discussion preoperatively about the potential use of mesh.  We discussed that if the defect was tiny, we would plan to repair primarily.  If larger, would plan to re-enforce the repair with mesh placement.  DESCRIPTION: The patient was identified in preop holding and taken to the OR where he was placed on the operating room table and SCDs were placed. General endotracheal anesthesia was induced without difficulty.  Pressure points were then padded. The patient was then prepped and draped in the usual sterile fashion. A surgical timeout was performed indicating the correct patient, procedure, positioning and need for preoperative antibiotics.  The skin at the site of the planned procedure was infiltrated with local anesthetic consisting of a mixture of Marcaine and Exparel.  A paraumbilical incision was made just superior to the umbilicus and carried down into the subcutaneous tissue. This was then carried down to the fascia using electrocautery just proximal to the hernia sac. The fascia was identified. The hernia sac was then identified and circumferentially dissected. The umbilical stalk was freed of attachments to the hernia sac. The hernia sac was not entered but fully reducible. The fascial edges were cleared of overlying subcutaneous tissue. The hernia defect was approximately 8 x 8 mm in size and for this reason, the decision was made to perform a primary repair. The overlying fascial defect was then approximated with interrupted 0 Ethibond sutures. Care was taken during this process to clearly  avoid the hernia sac in its reduced position.  The umbilical stalk was then tacked to the overlying fascia using a 2-0 Vicryl suture.  The wound was then irrigated with sterile saline  and hemostasis was verified.  3-0 Vicryl deep dermal sutures were placed.  The skin was then closed with a running 4-0 Monocryl subcuticular suture. Additional local anesthetic was infiltrated into the field. The incision was covered with dermabond. A piece of sterile gauze and tegaderm placed over this. The patient was then awakened from general anesthesia, extubated and transferred to a stretcher for transport in satisfactory condition.

## 2018-02-01 NOTE — Anesthesia Postprocedure Evaluation (Signed)
Anesthesia Post Note  Patient: Education officer, community  Procedure(s) Performed: OPEN UMBILICAL HERNIA REPAIR ERAS PATHWAY (N/A Abdomen)     Patient location during evaluation: PACU Anesthesia Type: General Level of consciousness: awake and alert Pain management: pain level controlled Vital Signs Assessment: post-procedure vital signs reviewed and stable Respiratory status: spontaneous breathing, nonlabored ventilation, respiratory function stable and patient connected to nasal cannula oxygen Cardiovascular status: blood pressure returned to baseline and stable Postop Assessment: no apparent nausea or vomiting Anesthetic complications: no    Last Vitals:  Vitals:   02/01/18 0845 02/01/18 0900  BP: 119/78 112/89  Pulse: 71 72  Resp: 16 18  Temp: (!) 36.2 C 36.4 C  SpO2: 96% 98%    Last Pain:  Vitals:   02/01/18 0845  TempSrc:   PainSc: 0-No pain                 Trevor Iha

## 2018-02-01 NOTE — Discharge Instructions (Signed)
POST OP INSTRUCTIONS  1. DIET: As tolerated. Follow a light bland diet the first 24 hours after arrival home, such as soup, liquids, crackers, etc.  Be sure to include lots of fluids daily.  Avoid fast food or heavy meals as your are more likely to get nauseated.  Eat a low fat the next few days after surgery.  2. Take your usually prescribed home medications unless otherwise directed.  3. PAIN CONTROL: a. Pain is best controlled by a usual combination of three different methods TOGETHER: i. Ice/Heat ii. Over the counter pain medication iii. Prescription pain medication b. Most patients will experience some swelling and bruising around the surgical site.  Ice packs or heating pads (30-60 minutes up to 6 times a day) will help. Some people prefer to use ice alone, heat alone, alternating between ice & heat.  Experiment to what works for you.  Swelling and bruising can take several weeks to resolve.   c. It is helpful to take an over-the-counter pain medication regularly for the first few weeks: i. Ibuprofen (Motrin/Advil) - 200mg  tabs - take 3 tabs (600mg ) every 6 hours as needed for pain unless you have an allergy to this medication ii. Acetaminophen (Tylenol) - you may take 650mg  every 6 hours as needed. You can take this with motrin as they act differently on the body. If you are taking a narcotic pain medication that has acetaminophen in it, do not take over the counter tylenol at the same time.  Iii. NOTE: You may take both of these medications together - most patients  find it most helpful when alternating between the two (i.e. Ibuprofen at 6am,  tylenol at 9am, ibuprofen at 12pm ...) d. A  prescription for pain medication should be given to you upon discharge (tramadol) and has been sent electronically to your pharmacy.  Take your pain medication as prescribed if your pain is not adequatly controlled with the over-the-counter pain reliefs mentioned above.  4. Avoid getting constipated.   Between the surgery and the pain medications, it is common to experience some constipation.  Increasing fluid intake and taking a fiber supplement (such as Metamucil, Citrucel, FiberCon, MiraLax, etc) 1-2 times a day regularly will usually help prevent this problem from occurring.  A mild laxative (prune juice, Milk of Magnesia, MiraLax, etc) should be taken according to package directions if there are no bowel movements after 48 hours.    5. Dressing: Your incision is covered in Dermabond which is like sterile superglue for the skin. This will come off on it's own in a couple weeks. It is waterproof and you may bathe normally starting the day after your surgery in a shower. Additionally, you have a clear dressing and gauze over the incision. This may be removed on 01/04/18. If this comes off sooner, it's ok. Avoid baths/pools/lakes/oceans until your wounds have fully healed - typically 3-4 weeks  6. ACTIVITIES as tolerated:   a. Avoid heavy lifting (>10lbs or 1 gallon of milk) for the next 6 weeks. b. You may resume regular (light) daily activities beginning the next day--such as daily self-care, walking, climbing stairs--gradually increasing activities as tolerated.  If you can walk 30 minutes without difficulty, it is safe to try more intense activity such as jogging, treadmill, bicycling, low-impact aerobics.  c. DO NOT PUSH THROUGH PAIN.  Let pain be your guide: If it hurts to do something, don't do it. d. Bonita Quin may drive when you are no longer taking prescription pain medication, you  can comfortably wear a seatbelt, and you can safely maneuver your car and apply brakes.  7. FOLLOW UP in our office a. Please call [facility] at [phone] to set up an appointment to see your surgeon in the office for a follow-up appointment approximately 2-3 weeks after your surgery. b. Make sure that you call for this appointment the day you arrive home to insure a convenient appointment time.  9. If you have disability  or family leave forms that need to be completed, you may have them completed by your primary care physician's office; for return to work instructions, please ask our office staff and they will be happy to assist you in obtaining this documentation   When to call us [phone]: 1. Poor pain control 2. Reactions / problems with new medications (rash/itching, etc)  3. Fever over 101.5 F (38.5 C) 4. Inability to urinate 5. Nausea/vomiting 6. Worsening swelling or bruising 7. Continued bleeding from incision. 8. Increased pain, redness, or drainage from the incision  The clinic staff is available to answer your questions during regular business hours (8:30am-5pm).  Please dont hesitate to call and ask to speak to one of our nurses for clinical concerns.   A surgeon from [facility] is always on call at the hospitals   If you have a medical emergency, go to the nearest emergency room or call 911.  [facility] [address], [address], [place], [place]  [address] MAIN: [phone] FAX: [phone] [email or web address]

## 2018-02-02 ENCOUNTER — Encounter (HOSPITAL_COMMUNITY): Payer: Self-pay | Admitting: Surgery

## 2018-02-05 ENCOUNTER — Ambulatory Visit (INDEPENDENT_AMBULATORY_CARE_PROVIDER_SITE_OTHER): Payer: BLUE CROSS/BLUE SHIELD | Admitting: Pharmacist

## 2018-02-05 ENCOUNTER — Other Ambulatory Visit: Payer: Self-pay | Admitting: Pharmacist

## 2018-02-05 DIAGNOSIS — Z8673 Personal history of transient ischemic attack (TIA), and cerebral infarction without residual deficits: Secondary | ICD-10-CM | POA: Diagnosis not present

## 2018-02-05 DIAGNOSIS — I639 Cerebral infarction, unspecified: Secondary | ICD-10-CM

## 2018-02-05 DIAGNOSIS — I825Z2 Chronic embolism and thrombosis of unspecified deep veins of left distal lower extremity: Secondary | ICD-10-CM

## 2018-02-05 DIAGNOSIS — D6861 Antiphospholipid syndrome: Secondary | ICD-10-CM | POA: Diagnosis not present

## 2018-02-05 DIAGNOSIS — Z7901 Long term (current) use of anticoagulants: Secondary | ICD-10-CM | POA: Diagnosis not present

## 2018-02-05 DIAGNOSIS — I63119 Cerebral infarction due to embolism of unspecified vertebral artery: Secondary | ICD-10-CM

## 2018-02-05 DIAGNOSIS — Z953 Presence of xenogenic heart valve: Secondary | ICD-10-CM

## 2018-02-05 DIAGNOSIS — Z5181 Encounter for therapeutic drug level monitoring: Secondary | ICD-10-CM

## 2018-02-05 LAB — POCT INR: INR: 1.6 — AB (ref 2.0–3.0)

## 2018-02-05 MED ORDER — ENOXAPARIN SODIUM 80 MG/0.8ML ~~LOC~~ SOLN: 80.0000 mg | Freq: Two times a day (BID) | SUBCUTANEOUS | 0 refills | Status: DC

## 2018-02-05 NOTE — Progress Notes (Signed)
Reviewed & concur Thanks DrG 

## 2018-02-05 NOTE — Patient Instructions (Signed)
Patient instructed to take medications as defined in the Anti-coagulation Track section of this encounter.  Patient instructed to take today's dose.  Patient instructed to take 1 & 1/2 x 5mg  (7.5mg  dose) on Monday, Wednesday and Friday. All other days, take only one (1) tablet of your 5mg  strength warfarin tablets. Continue Lovenox injections, one syringe (80mg ) subcutaneously administered every 12 hours for five more days.  Patient verbalized understanding of these instructions.

## 2018-02-05 NOTE — Progress Notes (Signed)
Anticoagulation Management Brian Lloyd is a 58 y.o. male who reports to the clinic for monitoring of warfarin treatment.    Indication: History of acute cerebral infarction; Antiphospholipid antibody syndrome, chronic DVT, current long term use of anticoagulant.    Duration: indefinite Supervising physician: Cephas Darby  Anticoagulation Clinic Visit History: Patient does not report signs/symptoms of bleeding or thromboembolism  Other recent changes: No diet, medications, lifestyle changes except as noted in patient findings. Anticoagulation Episode Summary    Current INR goal:   2.5-3.5  TTR:   69.5 % (1.9 y)  Next INR check:   02/12/2018  INR from last check:   1.6! 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 Naproxen Anaphylaxis   Prior to Admission medications   Medication Sig Start Date End Date Taking? Authorizing Provider  acetaminophen (TYLENOL) 500 MG tablet Take 1,000 mg by mouth every 6 (six) hours as needed for moderate pain or headache.    Yes [provider]  aspirin EC 81 MG tablet Take 81 mg by mouth daily.   Yes [provider]  enoxaparin (LOVENOX) 80 MG/0.8ML injection Inject 0.8 mLs (80 mg total) into the skin every 12 (twelve) hours for 5 days. 10 syringes to be dispensed for 5 days of  therapy. 02/05/18 02/10/18 Yes Elicia Lamp, RPH-CPP  Multiple Vitamins-Minerals (MULTIVITAMIN PO) Take 1 tablet by mouth daily.   Yes [provider]  psyllium (METAMUCIL) 58.6 % packet Take 1 packet by mouth daily.   Yes [provider]  traMADol (ULTRAM) 50 MG tablet Take 1 tablet (50 mg total) by mouth every 6 (six) hours as needed for up to 7 days (postoperative pain not controlled with tylenol and ibuprofen). 02/01/18 02/08/18 Yes Andria Meuse, MD  warfarin (COUMADIN) 5 MG tablet TAKE 5 MG (5 MG X 1) EVERY MON, WED, FRI 7.5 MG (5 MG X 1.5) ALL OTHER DAYS Patient taking differently: Take 5-7.5 mg by mouth See admin instructions. Take 7.5 mg by mouth daily Monday, Wednesday and Friday. Take 5 mg by mouth daily on all other days. 01/01/18  Yes Levert Feinstein, MD   Past Medical History:  Diagnosis Date  . Acute cerebral infarction (HCC) 11/18/2015   Bifrontal embolic infarcts MRI/MRA 11/18/15; known aortic valve vegetation on lovenox/coumadin/ASA  . Antiphospholipid antibody syndrome (HCC) 11/06/2015   11/04/15 significant elevation of IgG anticardiolipin & beta-2-GP-1 antibodies  . 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 Aortic valve mass   . 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 Arthritis    hands  . 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 DVT (deep venous thrombosis) (HCC)    left leg, on Xarelto  . DVT, lower extremity, distal, chronic (  HCC) 08/2015   Left calf DVT following injury  . 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 S/P aortic valve replacement with bioprosthetic valve 11/24/2015   [facility] pericardial tissue valve  . Thrombocytopenia (HCC)    Social History   Socioeconomic History  . Marital status: Married    Spouse name: Not on file  . Number of children: Not on file  . Years of education: Not on file  . Highest  education level: Not on file  Occupational History  . Not on file  Social Needs  . Financial resource strain: Not on file  . Food insecurity:    Worry: Not on file    Inability: Not on file  . Transportation needs:    Medical: Not on file    Non-medical: Not on file  Tobacco Use  . Smoking status: Never Smoker  . Smokeless tobacco: Never Used  Substance and Sexual Activity  . Alcohol use: Not Currently    Alcohol/week: 3.0 standard drinks    Types: 1 Glasses of wine, 1 Cans of beer, 1 Shots of liquor per week    Comment: rarely  . Drug use: No  . Sexual activity: Yes  Lifestyle  . Physical activity:    Days per week: Not on file    Minutes per session: Not on file  . Stress: Not on file  Relationships  . Social connections:    Talks on phone: Not on file    Gets together: Not on file    Attends religious service: Not on file    Active member of club or organization: Not on file    Attends meetings of clubs or organizations: Not on file    Relationship status: Not on file  Other Topics Concern  . Not on file  Social History Narrative  . Not on file   Family History  Problem Relation Age of Onset  . Heart disease Father   . Hypertension Father   . Heart disease Maternal Grandfather   . Hypertension Maternal Grandfather   . Diabetes Paternal Grandfather   . Heart disease Paternal Grandfather     ASSESSMENT Recent Results: The most recent result is correlated with 42.5 mg per week--but only 4 days back on warfarin after interruption for a colonoscopy (and 2 of these 4 days were at his "lower" dose).Will CONTINUE 80mg  SQ q12h x 5 days LMWH bridge therapy. Refill prescription has been sent to his pharmacy of choice: Lab Results  Component Value Date   INR 1.6 (A) 02/05/2018   INR 0.99 02/01/2018   INR 1.80 01/29/2018    Anticoagulation Dosing: Description   Take 1 & 1/2 tablets of your 5mg  peach-colored warfarin tablets by mouth, once-daily, at [facility]6PM on Mondays,  Wednesdays and  Fridays; all other days, take only one (1) tablet by mouth, once-daily, at 6PM. Continue Lovenox syringes, 80mg  (1 syringe) administered subcutaneously, every 12 hours for 5 more days.      INR today: Subtherapeutic  PLAN Weekly dose was resumed at 42.5mg  per week with continued LMWH bridge therapy, enoxaparin 80mg  SQ q12h x 5 days.   Patient Instructions  Patient instructed to take medications as defined in the Anti-coagulation Track section of this encounter.  Patient instructed to take today's dose.  Patient instructed to take 1 & 1/2 x 5mg  (7.5mg  dose) on Monday, Wednesday and Friday. All other days, take only one (1) tablet of your 5mg  strength warfarin tablets. Continue Lovenox injections, one syringe (  80mg ) subcutaneously administered every 12 hours for five more days.  Patient verbalized understanding of these instructions.     Patient advised to contact clinic or seek medical attention if signs/symptoms of bleeding or thromboembolism occur.  Patient verbalized understanding by repeating back information and was advised to contact me if further medication-related questions arise. Patient was also provided an information handout.  Follow-up Return in 1 week (on 02/12/2018) for Follow up INR at 0900h.  [employer] Lorrayne Ismael  15 minutes spent face-to-face with the patient during the encounter. 50% of time spent on education. 50% of time was spent on fingerstick point of care INR sample collection, processing, results determination, execution of bridge therapy plan and communication with the patient. .Discussed with patient at anticoagulation clinic encounter. INR is still sub-therapeutic, triggering additional bridge therapy. Will send eScript to [facility] (his request).

## 2018-02-09 ENCOUNTER — Other Ambulatory Visit (INDEPENDENT_AMBULATORY_CARE_PROVIDER_SITE_OTHER): Payer: BLUE CROSS/BLUE SHIELD | Admitting: Pharmacist

## 2018-02-09 DIAGNOSIS — Z953 Presence of xenogenic heart valve: Secondary | ICD-10-CM | POA: Diagnosis not present

## 2018-02-09 DIAGNOSIS — Z7901 Long term (current) use of anticoagulants: Secondary | ICD-10-CM

## 2018-02-09 DIAGNOSIS — Z5181 Encounter for therapeutic drug level monitoring: Secondary | ICD-10-CM | POA: Diagnosis not present

## 2018-02-12 ENCOUNTER — Ambulatory Visit: Payer: BLUE CROSS/BLUE SHIELD | Admitting: Pharmacist

## 2018-02-12 DIAGNOSIS — I63119 Cerebral infarction due to embolism of unspecified vertebral artery: Secondary | ICD-10-CM

## 2018-02-12 DIAGNOSIS — Z8673 Personal history of transient ischemic attack (TIA), and cerebral infarction without residual deficits: Secondary | ICD-10-CM

## 2018-02-12 DIAGNOSIS — I639 Cerebral infarction, unspecified: Secondary | ICD-10-CM

## 2018-02-12 DIAGNOSIS — Z953 Presence of xenogenic heart valve: Secondary | ICD-10-CM

## 2018-02-12 DIAGNOSIS — I825Z2 Chronic embolism and thrombosis of unspecified deep veins of left distal lower extremity: Secondary | ICD-10-CM

## 2018-02-12 DIAGNOSIS — D6861 Antiphospholipid syndrome: Secondary | ICD-10-CM

## 2018-02-12 LAB — POCT INR: INR: 2.2 (ref 2.0–3.0)

## 2018-02-12 NOTE — Progress Notes (Signed)
Patient was seen in clinic with Rebecca Fanning, PharmD, PGY1 pharmacy resident. I agree with the assessment and plan of care documented.   

## 2018-02-12 NOTE — Progress Notes (Addendum)
Anticoagulation Management Brian BustleWayne Lloyd is a 58 y.o. male who reports to the clinic for monitoring of warfarin treatment.    Indication: History of acute cerebral infarction; Antiphospholipid antibody syndrome, chronic DVT, current long term use of anticoagulant.    Duration: indefinite Supervising physician: Cephas DarbyJames Granfortuna  Anticoagulation Clinic Visit History: Patient does not report signs/symptoms of bleeding or thromboembolism. Patient does report some blood in stool on Saturday, but no other concerns today. Other recent changes: No diet, medications, lifestyle changes except as noted in patient findings.  Anticoagulation Episode Summary    Current INR goal:   2.5-3.5  TTR:   68.7 % (1.9 y)  Next INR check:   02/26/2018  INR from last check:   2.2! 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 Naproxen Anaphylaxis   Medication Sig  acetaminophen (TYLENOL) 500 MG tablet Take 1,000 mg by mouth every 6 (six) hours as needed for moderate pain or headache.   aspirin EC 81 MG tablet Take 81 mg by mouth daily.  Multiple Vitamins-Minerals (MULTIVITAMIN PO) Take 1 tablet by mouth daily.  psyllium (METAMUCIL) 58.6 % packet Take 1 packet by mouth daily.  warfarin (COUMADIN) 5 MG tablet TAKE 5 MG (5 MG X 1) EVERY MON, WED, FRI 7.5 MG  (5 MG X 1.5) ALL OTHER DAYS Patient taking differently: Take 5-7.5 mg by mouth See admin instructions. Take 7.5 mg by mouth daily Monday, Wednesday and Friday. Take 5 mg by mouth daily on all other days.  enoxaparin (LOVENOX) 80 MG/0.8ML injection Inject 0.8 mLs (80 mg total) into the skin every 12 (twelve) hours for 5 days. 10 syringes to be dispensed for 5 days of therapy.   Past Medical History:  Diagnosis Date  . Acute cerebral infarction (HCC) 11/18/2015   Bifrontal embolic infarcts MRI/MRA 11/18/15; known aortic valve vegetation on lovenox/coumadin/ASA  . Antiphospholipid antibody syndrome (HCC) 11/06/2015   11/04/15 significant elevation of IgG anticardiolipin & beta-2-GP-1 antibodies  . 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 Aortic valve mass   . 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 Arthritis    hands  . 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 DVT (deep venous thrombosis) (HCC)    left leg, on Xarelto  . 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 S/P aortic valve replacement with bioprosthetic valve 11/24/2015   [facility] bovine pericardial tissue valve  . Thrombocytopenia (HCC)    Social History   Socioeconomic History  . Marital status: Married    Spouse name: Not on file  . Number of children: Not on file  . Years of education: Not on file  .  Highest education level: Not on file  Occupational History  . Not on file  Social Needs  . Financial resource strain: Not on file  . Food insecurity:    Worry: Not on file    Inability: Not on file  . Transportation needs:    Medical: Not on file    Non-medical: Not on file  Tobacco Use  . Smoking status: Never Smoker  . Smokeless tobacco: Never Used  Substance and Sexual Activity  . Alcohol use: Not Currently     Alcohol/week: 3.0 standard drinks    Types: 1 Glasses of wine, 1 Cans of beer, 1 Shots of liquor per week    Comment: rarely  . Drug use: No  . Sexual activity: Yes  Lifestyle  . Physical activity:    Days per week: Not on file    Minutes per session: Not on file  . Stress: Not on file  Relationships  . Social connections:    Talks on phone: Not on file    Gets together: Not on file    Attends religious service: Not on file    Active member of club or organization: Not on file    Attends meetings of clubs or organizations: Not on file    Relationship status: Not on file  Other Topics Concern  . Not on file  Social History Narrative  . Not on file   Family History  Problem Relation Age of Onset  . Heart disease Father   . Hypertension Father   . Heart disease Maternal Grandfather   . Hypertension Maternal Grandfather   . Diabetes Paternal Grandfather   . Heart disease Paternal Grandfather    ASSESSMENT Lab Results  Component Value Date   INR 2.2 02/12/2018   INR 1.6 (A) 02/05/2018   INR 0.99 02/01/2018   Anticoagulation Dosing: Description   Take 1 & 1/2 tablets of your 5mg  peach-colored warfarin tablets by mouth, once-daily, at [facility] on Mondays, Wednesdays and  Fridays; all other days, take only one (1) tablet by mouth, once-daily, at 6PM. Continue Lovenox syringes, 80mg  (1 syringe) administered subcutaneously, every 12 hours for 5 more days.      INR today: Subtherapeutic  PLAN Patient left clinic before receiving any dosing instructions. Tried calling patient 3 times, but no answer. Left VM to call back and no reply  There are no Patient Instructions on file for this visit.   Follow-up 1-2 weeks  [name]

## 2018-02-26 ENCOUNTER — Encounter: Payer: Self-pay | Admitting: Pharmacist

## 2018-02-26 ENCOUNTER — Ambulatory Visit (INDEPENDENT_AMBULATORY_CARE_PROVIDER_SITE_OTHER): Payer: BLUE CROSS/BLUE SHIELD | Admitting: Pharmacist

## 2018-02-26 DIAGNOSIS — I825Z2 Chronic embolism and thrombosis of unspecified deep veins of left distal lower extremity: Secondary | ICD-10-CM

## 2018-02-26 DIAGNOSIS — I639 Cerebral infarction, unspecified: Secondary | ICD-10-CM

## 2018-02-26 DIAGNOSIS — Z8673 Personal history of transient ischemic attack (TIA), and cerebral infarction without residual deficits: Secondary | ICD-10-CM | POA: Diagnosis not present

## 2018-02-26 DIAGNOSIS — D6861 Antiphospholipid syndrome: Secondary | ICD-10-CM | POA: Diagnosis not present

## 2018-02-26 DIAGNOSIS — Z7901 Long term (current) use of anticoagulants: Secondary | ICD-10-CM | POA: Diagnosis not present

## 2018-02-26 DIAGNOSIS — I63119 Cerebral infarction due to embolism of unspecified vertebral artery: Secondary | ICD-10-CM

## 2018-02-26 DIAGNOSIS — Z953 Presence of xenogenic heart valve: Secondary | ICD-10-CM

## 2018-02-26 DIAGNOSIS — Z5181 Encounter for therapeutic drug level monitoring: Secondary | ICD-10-CM

## 2018-02-26 LAB — POCT INR: INR: 2.5 (ref 2.0–3.0)

## 2018-02-26 NOTE — Patient Instructions (Signed)
Patient instructed to take medications as defined in the Anti-coagulation Track section of this encounter.  Patient instructed to take today's dose.  Patient instructed to take 1 & 1/2 tablets of your 5mg  peach-colored warfarin tablets by mouth, once-daily, at [facility] on Mondays, Tuesdays, Wednesdays and  Fridays; all other days, take only one (1) tablet by mouth, once-daily, at [facility].  Patient verbalized understanding of these instructions.

## 2018-02-26 NOTE — Progress Notes (Signed)
Anticoagulation Management Brian Lloyd is a 58 y.o. male who reports to the clinic for monitoring of warfarin treatment.    Indication: History of acute cerebral infarction, Antiphospholipid antibody syndrome, History of chronic DVT of distal vein, long term current use of anticoagulant.    Duration: indefinite Supervising physician: Cephas Darby  Anticoagulation Clinic Visit History: Patient does not report signs/symptoms of bleeding or thromboembolism  Other recent changes: No diet, medications, lifestyle changes.  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 Naproxen Anaphylaxis   Prior to Admission medications   Medication Sig Start Date End Date Taking? Authorizing Provider  acetaminophen (TYLENOL) 500 MG tablet Take 1,000 mg by mouth every 6 (six) hours as needed for moderate pain or headache.    Yes [provider]  aspirin EC 81 MG tablet Take 81 mg by mouth daily.   Yes [provider]  Multiple Vitamins-Minerals (MULTIVITAMIN PO) Take 1 tablet by mouth daily.   Yes [provider]  psyllium (METAMUCIL) 58.6 % packet Take 1 packet by mouth daily.    Yes [provider]  warfarin (COUMADIN) 5 MG tablet TAKE 5 MG (5 MG X 1) EVERY MON, WED, FRI 7.5 MG (5 MG X 1.5) ALL OTHER DAYS Patient taking differently: Take 5-7.5 mg by mouth See admin instructions. Take 7.5 mg by mouth daily Monday, Wednesday and Friday. Take 5 mg by mouth daily on all other days. 01/01/18  Yes Levert Feinstein, MD  enoxaparin (LOVENOX) 80 MG/0.8ML injection Inject 0.8 mLs (80 mg total) into the skin every 12 (twelve) hours for 5 days. 10 syringes to be dispensed for 5 days of therapy. 02/05/18 02/10/18  Elicia Lamp, RPH-CPP   Past Medical History:  Diagnosis Date  . Acute cerebral infarction (HCC) 11/18/2015   Bifrontal embolic infarcts MRI/MRA 11/18/15; known aortic valve vegetation on lovenox/coumadin/ASA  . Antiphospholipid antibody syndrome (HCC) 11/06/2015   11/04/15 significant elevation of IgG anticardiolipin & beta-2-GP-1 antibodies  . 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 Aortic valve mass   . 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 Arthritis    hands  . 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 DVT (deep venous thrombosis) (HCC)    left leg, on Xarelto  . 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 S/P aortic valve replacement with bioprosthetic valve 11/24/2015   25 mm [employer] Ease bovine pericardial tissue valve  . Thrombocytopenia (HCC)  Social History   Socioeconomic History  . Marital status: Married    Spouse name: Not on file  . Number of children: Not on file  . Years of education: Not on file  . Highest education level: Not on file  Occupational History  . Not on file  Social Needs  . Financial resource strain: Not on file  . Food insecurity:    Worry: Not on file    Inability: Not on file  . Transportation needs:    Medical: Not on  file    Non-medical: Not on file  Tobacco Use  . Smoking status: Never Smoker  . Smokeless tobacco: Never Used  Substance and Sexual Activity  . Alcohol use: Not Currently    Alcohol/week: 3.0 standard drinks    Types: 1 Glasses of wine, 1 Cans of beer, 1 Shots of liquor per week    Comment: rarely  . Drug use: No  . Sexual activity: Yes  Lifestyle  . Physical activity:    Days per week: Not on file    Minutes per session: Not on file  . Stress: Not on file  Relationships  . Social connections:    Talks on phone: Not on file    Gets together: Not on file    Attends religious service: Not on file    Active member of club or organization: Not on file    Attends meetings of clubs or organizations: Not on file    Relationship status: Not on file  Other Topics Concern  . Not on file  Social History Narrative  . Not on file   Family History  Problem Relation Age of Onset  . Heart disease Father   . Hypertension Father   . Heart disease Maternal Grandfather   . Hypertension Maternal Grandfather   . Diabetes Paternal Grandfather   . Heart disease Paternal Grandfather     ASSESSMENT Recent Results: The most recent result is correlated with 42.5 mg per week: Lab Results  Component Value Date   INR 2.5 02/26/2018   INR 2.2 02/12/2018   INR 1.6 (A) 02/05/2018    Anticoagulation Dosing: Description   Take 1 & 1/2 tablets of your 5mg  peach-colored warfarin tablets by mouth, once-daily, at [facility]6PM on Mondays, Tuesdays, Wednesdays and  Fridays; all other days, take only one (1) tablet by mouth, once-daily, at 6PM.      INR today: Therapeutic  PLAN Weekly dose was increased by 6% to 45 mg per week  Patient Instructions  Patient instructed to take medications as defined in the Anti-coagulation Track section of this encounter.  Patient instructed to take today's dose.  Patient instructed to take 1 & 1/2 tablets of your 5mg  peach-colored warfarin tablets by mouth, once-daily,  at [facility]6PM on Mondays, Tuesdays, Wednesdays and  Fridays; all other days, take only one (1) tablet by mouth, once-daily, at [facility]6PM.  Patient verbalized understanding of these instructions.     Patient advised to contact clinic or seek medical attention if signs/symptoms of bleeding or thromboembolism occur.  Patient verbalized understanding by repeating back information and was advised to contact me if further medication-related questions arise. Patient was also provided an information handout.  Follow-up Return in 3 weeks (on 03/19/2018) for Follow up INR at 0945h.  Elicia LampJames B Alycia Cooperwood, PharmD, CPP  15 minutes spent face-to-face with the patient during the encounter. 50% of time spent on education. 50% of time was spent on fingerstick point of care INR sample collection, processing, results  determination, dose adjustment and documentation in [email or web address].

## 2018-02-28 NOTE — Progress Notes (Addendum)
INTERNAL MEDICINE TEACHING ATTENDING ADDENDUM - Earl Lagos M.D  Duration- indefinite, Indication- aortic valve vegetation, CVA INR- therapeutic. Agree with pharmacy recommendations as outlined in their note.

## 2018-03-19 ENCOUNTER — Ambulatory Visit (INDEPENDENT_AMBULATORY_CARE_PROVIDER_SITE_OTHER): Payer: BLUE CROSS/BLUE SHIELD | Admitting: Oncology

## 2018-03-19 ENCOUNTER — Other Ambulatory Visit: Payer: Self-pay

## 2018-03-19 ENCOUNTER — Encounter: Payer: Self-pay | Admitting: Pharmacist

## 2018-03-19 ENCOUNTER — Encounter: Payer: Self-pay | Admitting: Oncology

## 2018-03-19 ENCOUNTER — Ambulatory Visit (INDEPENDENT_AMBULATORY_CARE_PROVIDER_SITE_OTHER): Payer: BLUE CROSS/BLUE SHIELD | Admitting: Pharmacist

## 2018-03-19 VITALS — BP 124/80 | HR 74 | Temp 97.9°F | Ht 68.0 in | Wt 190.9 lb

## 2018-03-19 DIAGNOSIS — Z7901 Long term (current) use of anticoagulants: Secondary | ICD-10-CM | POA: Diagnosis not present

## 2018-03-19 DIAGNOSIS — I825Z2 Chronic embolism and thrombosis of unspecified deep veins of left distal lower extremity: Secondary | ICD-10-CM

## 2018-03-19 DIAGNOSIS — D6861 Antiphospholipid syndrome: Secondary | ICD-10-CM | POA: Diagnosis not present

## 2018-03-19 DIAGNOSIS — M3211 Endocarditis in systemic lupus erythematosus: Secondary | ICD-10-CM | POA: Diagnosis not present

## 2018-03-19 DIAGNOSIS — Z5181 Encounter for therapeutic drug level monitoring: Secondary | ICD-10-CM

## 2018-03-19 DIAGNOSIS — Z953 Presence of xenogenic heart valve: Secondary | ICD-10-CM | POA: Diagnosis not present

## 2018-03-19 DIAGNOSIS — Z8673 Personal history of transient ischemic attack (TIA), and cerebral infarction without residual deficits: Secondary | ICD-10-CM | POA: Diagnosis not present

## 2018-03-19 DIAGNOSIS — I63119 Cerebral infarction due to embolism of unspecified vertebral artery: Secondary | ICD-10-CM

## 2018-03-19 DIAGNOSIS — I639 Cerebral infarction, unspecified: Secondary | ICD-10-CM

## 2018-03-19 LAB — POCT INR: INR: 2.5 (ref 2.0–3.0)

## 2018-03-19 NOTE — Patient Instructions (Signed)
We   Will enroll you in our Medicine Clinic so you can continue to have your coumadin here. I will also make a referral to a Hematologist at the [facility] for your ongoing Hematology care.  Good Luck! drG

## 2018-03-19 NOTE — Patient Instructions (Signed)
Patient instructed to take medications as defined in the Anti-coagulation Track section of this encounter.  Patient instructed to take today's dose.  Patient instructed to take 1 & 1/2 tablets of your 5mg peach-colored warfarin tablets by mouth, once-daily, at 6PM each day.  Patient verbalized understanding of these instructions.     

## 2018-03-19 NOTE — Progress Notes (Signed)
Hematology and Oncology Follow Up Visit  Brian Lloyd [id number] 03-19-1960 58 y.o. 03/19/2018 2:44 PM   Principle Diagnosis: Encounter Diagnoses  Name Primary?  . 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 ([place]) Yes  . Antiphospholipid antibody syndrome ([place])   . History of CVA (cerebrovascular accident) without residual deficits   . 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 S/P aortic valve replacement with bioprosthetic valve   Clinical Summary: 58 year old man I initially saw on 11/04/2015 for further evaluation of thrombocytopenia. Please see previous notes for full details. At that point he had been evaluated for an acute left cerebellar infarct with no cardiac risk factors. 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 No fever. No leukocytosis. No recent dental work. No elevation of ESR. No evidence for a PFO or ASD on TEE. 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 I ordered an antiphospholipid antibodies and values returned highly elevated. ANA and rheumatoid factor negative but subsequentlytested anti-DNA double-stranded antibodies were elevated. He had no signs or symptoms of a collagen vascular disorder. Additional finding was that he had what appeared to be an embolic stroke while on therapeutic anticoagulation with Xarelto started for a left lower extremity DVT which occurred 2 months prior. He was changed to aspirin and Lovenox. While arrangements were being made for surgery on his valve, he had recurrent neurologic signs and symptoms with diplopia and vertigo. Repeat MRI brain now showed 2 new infarcts in the frontal lobes bilaterally. Surgery was expedited and done on November 02, 2015. He had a bioprosthetic aortic valve placed. Surgeon had to go back in the same day and do a 2 vessel bypass  surgery for optimal cardiac perfusion around a tight valve with anomalous vascular anatomy compressing the coronaries near the newly placed valve. He did not require any blood products to get through the surgery. He had anuneventful postoperative course. It was difficult to monitor his platelet count due to the in vitro platelet clumping. Machine count got down as low as about 24,000. I did transfuse platelets at that point but maximum count posttransfusion only 39,000 which to me demonstrated that he does have an element of immune mediated platelet destruction as well.  Interim History: I have not seen the patient in over a year for an official exam but he comes frequently for his Coumadin checks and we have spoken unofficially.  He recently had an umbilical hernia repair December 2019 and we managed his anticoagulation around this and he did well. 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  He is using Tylenol with little relief.  He did discuss this with a neurologist at time of follow-up.  Neurologist wrote him a prescription presumably for a Imitrex type drug but it was so expensive that he did not want to get it filled.  Although he may have had a rare migraine in the past, they have been increased in frequency since he had multiple cerebral infarcts from marantic endocarditis.  It is history that these occurred in a visual area of his brain him.  I looked back at his MRI reports.  The infarcts occurred in bilateral temporal lobes and the left cerebellum. He has had no major bleeding issues on the chronic warfarin plus aspirin.  He does note that if his INR gets over 2.5 he gets increased hemorrhoidal bleeding  and an occasional episode of epistaxis. Otherwise he feels very well.  He feels better since he has been on anticoagulation and has had his aortic heart valve replaced.  No neurologic signs or symptoms earlier than the migraine.  No chest pain, palpitations, or dyspnea. He decided to  get out of a job where he had a lot of physical activity with risk for injury and is now working in an office.  Medications: reviewed  Allergies:  Allergies  Allergen Reactions  . Naproxen Anaphylaxis    Review of Systems: See interim history Remaining ROS negative:   Physical Exam: Blood pressure 124/80, pulse 74, temperature 97.9 F (36.6 C), temperature source Oral, height 5' 8"  (1.727 m), weight 190 lb 14.4 oz (86.6 kg), SpO2 98 %. Wt Readings from Last 3 Encounters:  03/19/18 190 lb 14.4 oz (86.6 kg)  02/01/18 182 lb (82.6 kg)  01/29/18 182 lb (82.6 kg)     General appearance: well nourished Caucasian man HENNT: Pharynx no erythema, exudate, mass, or ulcer. No thyromegaly or thyroid nodules Lymph nodes: No cervical, supraclavicular, or axillary lymphadenopathy Breasts:  Lungs: Clear to auscultation, resonant to percussion throughout Heart: Regular rhythm, no murmur, no gallop, no rub, no click, no edema Abdomen: Soft, nontender, normal bowel sounds, no mass, no organomegaly Extremities: No edema, no calf tenderness Musculoskeletal: no joint deformities GU:  Vascular: Carotid pulses 2+, no bruits,  symmetric Neurologic: Alert, oriented, PERRLA, optic discs sharp and vessels normal, no hemorrhage or exudate, cranial nerves grossly normal, motor strength 5 over 5, reflexes 1+ symmetric, upper body coordination normal, gait normal, Skin: No rash or ecchymosis  Lab Results: CBC W/Diff    Component Value Date/Time   WBC 5.9 01/29/2018 1129   RBC 5.25 01/29/2018 1129   HGB 15.2 01/29/2018 1129   HGB 15.1 04/18/2016 1209   HCT 45.3 01/29/2018 1129   HCT 44.2 04/18/2016 1209   PLT 69 (L) 01/29/2018 1129   PLT CANCELED 04/18/2016 1209   MCV 86.3 01/29/2018 1129   MCV 82 04/18/2016 1209   MCH 29.0 01/29/2018 1129   MCHC 33.6 01/29/2018 1129   RDW 12.2 01/29/2018 1129   RDW 15.0 04/18/2016 1209   LYMPHSABS 1.1 01/29/2018 1129   LYMPHSABS 1.2 04/18/2016 1209   MONOABS  0.4 01/29/2018 1129   EOSABS 0.2 01/29/2018 1129   EOSABS 0.2 04/18/2016 1209   BASOSABS 0.0 01/29/2018 1129   BASOSABS 0.0 04/18/2016 1209     Chemistry      Component Value Date/Time   NA 140 01/29/2018 1129   NA 138 04/18/2016 1209   K 4.5 01/29/2018 1129   CL 108 01/29/2018 1129   CO2 25 01/29/2018 1129   BUN 24 (H) 01/29/2018 1129   BUN 18 04/18/2016 1209   CREATININE 1.21 01/29/2018 1129   CREATININE 1.27 10/26/2015 1114      Component Value Date/Time   CALCIUM 9.2 01/29/2018 1129   ALKPHOS 42 01/29/2018 1129   AST 30 01/29/2018 1129   ALT 36 01/29/2018 1129   BILITOT 0.7 01/29/2018 1129   BILITOT 0.5 04/18/2016 1209       Radiological Studies: No results found.  Impression: 1.  Antiphospholipid antibody syndrome Stable with no subsequent thrombotic events on warfarin plus low-dose aspirin. A recent prospective study in people with triple positive antiphospholipid antibody syndrome randomized to either stay on Coumadin or switch to a new oral anticoagulant showed a very high incidence of exclusively arterial thrombotic events in the patients who switched to  the NOAC given that they had a prior venous event.  There were no new thrombotic events on the cohort who staying on Coumadin.  This paper definitively shows that the new oral anticoagulants are insufficient to control thrombosis in these patients. He will continue on his current regimen. He has had excellent warfarin management by Dr. Elie Confer in our Coumadin clinic and would like to continue. He is willing to establish in our primary care clinic so that he can continue in the Coumadin clinic. He has major insurance issues and will likely change to physicians in the [facility] within the next year. He still needs a hematologist to supervise his anticoagulation so I have made a referral to Dr. Irene Limbo at the [place] [facility].  2.  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  3.  Status post aortic valve replacement secondary to 1 and 2.  4.  Ocular migraine  5.  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  6. Non-spurious thrombocytopenia secondary to 1.   CC: Patient Care Team: Orpah Melter, MD as PCP - General (Family Medicine) Rexene Alberts, MD as Consulting Physician (Cardiothoracic Surgery) Sid Falcon, MD as Consulting Physician (Internal Medicine) Annia Belt, MD as Consulting Physician (Oncology) Leonie Man, MD as Consulting Physician (Cardiology)   Murriel Hopper, MD, [place]  Hematology-Oncology/Internal Medicine     2/17/20202:44 PM

## 2018-03-19 NOTE — Progress Notes (Signed)
Reviewed Thanks DrG 

## 2018-03-19 NOTE — Progress Notes (Signed)
Anticoagulation Management Brian Lloyd is a 58 y.o. male who reports to the clinic for monitoring of warfarin treatment.    Indication: History of acute CVA, APLABS, Chronic DVT, Long term current use of anticoagulant.    Duration: indefinite Supervising physician: Cephas Darby  Anticoagulation Clinic Visit History: Patient does not report signs/symptoms of bleeding or thromboembolism  Other recent changes: No diet, medications, lifestyle changes.  Anticoagulation Episode Summary    Current INR goal:   2.5-3.5  TTR:   68.3 % (2 y)  Next INR check:   04/09/2018  INR from last check:   2.5 (03/19/2018)  Weekly max warfarin dose:     Target end date:   Indefinite  INR check location:   Anticoagulation Clinic  Preferred lab:     Send INR reminders to:      Indications   Acute cerebral infarction (HCC) [I63.9] Antiphospholipid antibody syndrome (HCC) [D68.61] 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) [I82.5Z2] Cerebrovascular accident (CVA) due to embolism of vertebral artery (HCC) [I63.119] History of CVA (cerebrovascular accident) without residual deficits [Z86.73] S/P aortic valve replacement with bioprosthetic valve [Z95.3]       Comments:   Patient commenced attending this clinic on 11/12/2015        Allergies  Allergen Reactions  . Naproxen Anaphylaxis   Prior to Admission medications   Medication Sig Start Date End Date Taking? Authorizing Provider  acetaminophen (TYLENOL) 500 MG tablet Take 1,000 mg by mouth every 6 (six) hours as needed for moderate pain or headache.    Yes [provider]  aspirin EC 81 MG tablet Take 81 mg by mouth daily.   Yes [provider]  Multiple Vitamins-Minerals (MULTIVITAMIN PO) Take 1 tablet by mouth daily.   Yes [provider]  psyllium (METAMUCIL) 58.6 % packet Take 1 packet by mouth daily.   Yes [provider]  warfarin (COUMADIN) 5 MG tablet TAKE 5 MG  (5 MG X 1) EVERY MON, WED, FRI 7.5 MG (5 MG X 1.5) ALL OTHER DAYS Patient taking differently: Take 5-7.5 mg by mouth See admin instructions. Take 7.5 mg by mouth daily Monday, Wednesday and Friday. Take 5 mg by mouth daily on all other days. 01/01/18  Yes Levert Feinstein, MD  enoxaparin (LOVENOX) 80 MG/0.8ML injection Inject 0.8 mLs (80 mg total) into the skin every 12 (twelve) hours for 5 days. 10 syringes to be dispensed for 5 days of therapy. 02/05/18 02/10/18  Elicia Lamp, RPH-CPP   Past Medical History:  Diagnosis Date  . Acute cerebral infarction (HCC) 11/18/2015   Bifrontal embolic infarcts MRI/MRA 11/18/15; known aortic valve vegetation on lovenox/coumadin/ASA  . Antiphospholipid antibody syndrome (HCC) 11/06/2015   11/04/15 significant elevation of IgG anticardiolipin & beta-2-GP-1 antibodies  . 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 Aortic valve mass   . 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 Arthritis    hands  . 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 DVT (deep venous thrombosis) (HCC)    left leg, on Xarelto  . 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 S/P aortic valve replacement with bioprosthetic valve 11/24/2015   25 mm [facility] Ease bovine pericardial tissue valve  . Thrombocytopenia [facility])    Social History   Socioeconomic  History  . Marital status: Married    Spouse name: Not on file  . Number of children: Not on file  . Years of education: Not on file  . Highest education level: Not on file  Occupational History  . Not on file  Social Needs  . Financial resource strain: Not on file  . Food insecurity:    Worry: Not on file    Inability: Not on file  . Transportation needs:    Medical: Not on file    Non-medical: Not on file  Tobacco Use  . Smoking status: Never  Smoker  . Smokeless tobacco: Never Used  Substance and Sexual Activity  . Alcohol use: Not Currently    Alcohol/week: 3.0 standard drinks    Types: 1 Glasses of wine, 1 Cans of beer, 1 Shots of liquor per week  . Drug use: No  . Sexual activity: Yes  Lifestyle  . Physical activity:    Days per week: Not on file    Minutes per session: Not on file  . Stress: Not on file  Relationships  . Social connections:    Talks on phone: Not on file    Gets together: Not on file    Attends religious service: Not on file    Active member of club or organization: Not on file    Attends meetings of clubs or organizations: Not on file    Relationship status: Not on file  Other Topics Concern  . Not on file  Social History Narrative  . Not on file   Family History  Problem Relation Age of Onset  . Heart disease Father   . Hypertension Father   . Heart disease Maternal Grandfather   . Hypertension Maternal Grandfather   . Diabetes Paternal Grandfather   . Heart disease Paternal Grandfather     ASSESSMENT Recent Results: The most recent result is correlated with 45 mg per week: Lab Results  Component Value Date   INR 2.5 03/19/2018   INR 2.5 02/26/2018   INR 2.2 02/12/2018    Anticoagulation Dosing: Description   Take 1 & 1/2 tablets of your 5mg  peach-colored warfarin tablets by mouth, once-daily, at [facility] each day.      INR today: Therapeutic  PLAN Weekly dose was increased by 17% to 52.5 mg per week  Patient Instructions  Patient instructed to take medications as defined in the Anti-coagulation Track section of this encounter.  Patient instructed to take today's dose.  Patient instructed to take 1 & 1/2 tablets of your 5mg  peach-colored warfarin tablets by mouth, once-daily, at [facility] each day.  Patient verbalized understanding of these instructions.     Patient advised to contact clinic or seek medical attention if signs/symptoms of bleeding or thromboembolism  occur.  Patient verbalized understanding by repeating back information and was advised to contact me if further medication-related questions arise. Patient was also provided an information handout.  Follow-up Return in 3 weeks (on 04/09/2018) for Follow up INR at 1000h.  Elicia Lamp, PharmD, CPP  15 minutes spent face-to-face with the patient during the encounter. 50% of time spent on education, including signs/sx bleeding and clotting, as well as food and drug interactions with warfarin. 50% of time was spent on fingerprick POC INR sample collection,processing, results determination, and documentation in [email or web address].

## 2018-03-20 DIAGNOSIS — M9903 Segmental and somatic dysfunction of lumbar region: Secondary | ICD-10-CM | POA: Diagnosis not present

## 2018-03-20 DIAGNOSIS — M5441 Lumbago with sciatica, right side: Secondary | ICD-10-CM | POA: Diagnosis not present

## 2018-03-20 DIAGNOSIS — M5136 Other intervertebral disc degeneration, lumbar region: Secondary | ICD-10-CM | POA: Diagnosis not present

## 2018-03-20 DIAGNOSIS — M9904 Segmental and somatic dysfunction of sacral region: Secondary | ICD-10-CM | POA: Diagnosis not present

## 2018-03-21 DIAGNOSIS — M5441 Lumbago with sciatica, right side: Secondary | ICD-10-CM | POA: Diagnosis not present

## 2018-03-21 DIAGNOSIS — M9904 Segmental and somatic dysfunction of sacral region: Secondary | ICD-10-CM | POA: Diagnosis not present

## 2018-03-21 DIAGNOSIS — M9903 Segmental and somatic dysfunction of lumbar region: Secondary | ICD-10-CM | POA: Diagnosis not present

## 2018-03-21 DIAGNOSIS — M5136 Other intervertebral disc degeneration, lumbar region: Secondary | ICD-10-CM | POA: Diagnosis not present

## 2018-03-22 DIAGNOSIS — M5136 Other intervertebral disc degeneration, lumbar region: Secondary | ICD-10-CM | POA: Diagnosis not present

## 2018-03-22 DIAGNOSIS — M9903 Segmental and somatic dysfunction of lumbar region: Secondary | ICD-10-CM | POA: Diagnosis not present

## 2018-03-22 DIAGNOSIS — M5441 Lumbago with sciatica, right side: Secondary | ICD-10-CM | POA: Diagnosis not present

## 2018-03-22 DIAGNOSIS — M9904 Segmental and somatic dysfunction of sacral region: Secondary | ICD-10-CM | POA: Diagnosis not present

## 2018-04-06 IMAGING — CT CT HEART MORP W/ CTA COR W/ SCORE W/ CA W/CM &/OR W/O CM
1 of 13 series · 2 of 20 positions shown, 3 images · non-contrast
Comparison: None.

CLINICAL DATA: 54-year-old male with h/o stroke and a mobile
echodensity found on the non-coronary cusp of the aortic valve.

EXAM:
Cardiac CTA
TECHNIQUE: The patient was scanned on a Philips 256 scanner. A 120 kV
retrospective scan was triggered in the descending thoracic aorta at
111 HU's. Gantry rotation speed was 270 msecs and collimation was .9
mm. No beta blockade or nitro were given. The 3D data set was
reconstructed in 5% intervals of the R-R cycle. Systolic and
diastolic phases were analyzed on a dedicated work station using
MPR, MIP and VRT modes. The patient received 80 cc of contrast.

[Series 11: multi 2 30% · axial · 0.36mm/px · z∈[-224,-61]mm · 2 of 164 slices shown, 3 images]
[im 1/164  vessel]
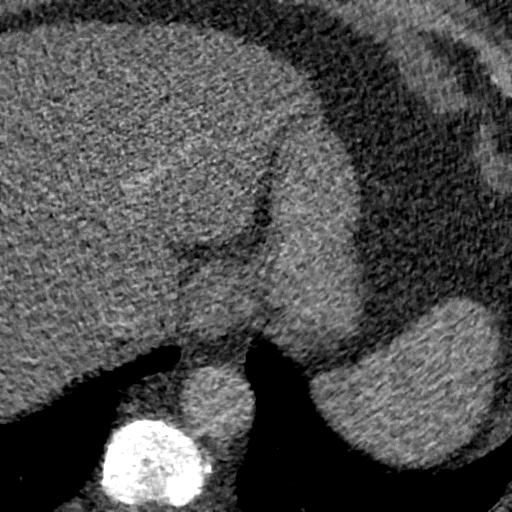
[im 1/164  lung]
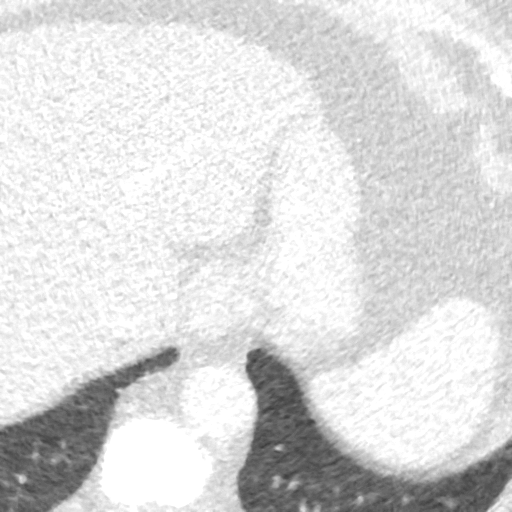
[im 164/164  vessel]
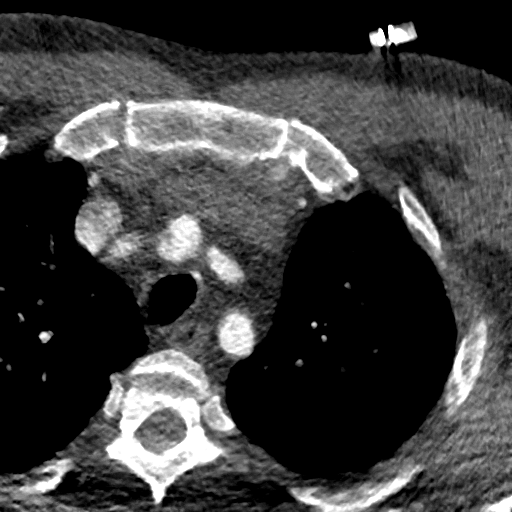

[2 of 20 positions shown; findings below may reference images not displayed]

FINDINGS: Aortic Valve: Trileaflet. No calcifications. There is a lesion in
between left and non-coronary cusp that measures 17 x 12 x 9 mm that
could represents a thrombus or a fibroelastoma.

Aorta:  Normal size, no calcifications, no dissection.

Sinotubular Junction:  30 x 30 mm

Ascending Thoracic Aorta:  31 x 31 mm

Aortic Arch:  28 x 27 mm

Descending Thoracic Aorta:  27 x 27 mm

Sinus of Valsalva Measurements:

Non-coronary:  36 mm

Right -coronary:  34 mm

Left -coronary:  36 mm

Coronary Arteries:  The study was performed without use of NTG.

There is right dominance. Left coronary artery originates
posteriorly in between left and non-coronary cusp and continues
anteriorly in a usual fashion. There is no plaque in the proximal
arteries.

There is normal pulmonary vein drainage into the left atrium.

Left atrial appendage has no thrombus.

Normal size of the pulmonary artery.
IMPRESSION: 1. There is a lesion in between left and non-coronary cusp that
measures 17 x 12 x 9 mm that could represents a thrombus or a
fibroelastoma. There is no invasion into the adjacent structures.

An aggressive anticoagulation and follow up GALAVIZ in 3-4 weeks is
recommended.

2. Normal size of the thoracic aorta, no calcifications, no
dissection.

3. Left coronary artery originates posteriorly in between left and
non-coronary cusp and continues anteriorly in a usual fashion. There
is no plaque in the proximal arteries.

Brittny Cappiello

EXAM:
OVER-READ INTERPRETATION  CT CHEST

The following report is an over-read performed by radiologist Dr.
over-read does not include interpretation of cardiac or coronary
anatomy or pathology. The coronary CTA interpretation by the
cardiologist is attached.
FINDINGS: Several tiny pulmonary nodules are noted throughout the visualized
portions of the lungs, measuring up to 4 mm in the right lower lobe
(image 43 of series 5). No other larger more suspicious appearing
pulmonary nodules or masses are noted within the visualized portions
of the thorax. Within the visualized thorax there is no acute
consolidative airspace disease, no pleural effusions, no
pneumothorax and no lymphadenopathy. Visualized portions of the
upper abdomen are unremarkable. There are no aggressive appearing
lytic or blastic lesions noted in the visualized portions of the
skeleton.
IMPRESSION: 1. Several tiny pulmonary nodules in the lungs bilaterally measuring
4 mm or less in size. These are nonspecific, and are statistically
likely benign. No follow-up needed if patient is low-risk (and has
no known or suspected primary neoplasm). Non-contrast chest CT can
be considered in 12 months if patient is high-risk. This
recommendation follows the consensus statement: Guidelines for
Management of Incidental Pulmonary Nodules Detected on CT Images:

## 2018-04-09 ENCOUNTER — Ambulatory Visit (INDEPENDENT_AMBULATORY_CARE_PROVIDER_SITE_OTHER): Payer: BLUE CROSS/BLUE SHIELD

## 2018-04-09 DIAGNOSIS — Z7901 Long term (current) use of anticoagulants: Secondary | ICD-10-CM

## 2018-04-09 DIAGNOSIS — Z953 Presence of xenogenic heart valve: Secondary | ICD-10-CM | POA: Diagnosis not present

## 2018-04-09 DIAGNOSIS — I639 Cerebral infarction, unspecified: Secondary | ICD-10-CM

## 2018-04-09 DIAGNOSIS — I825Z2 Chronic embolism and thrombosis of unspecified deep veins of left distal lower extremity: Secondary | ICD-10-CM | POA: Diagnosis not present

## 2018-04-09 DIAGNOSIS — I63119 Cerebral infarction due to embolism of unspecified vertebral artery: Secondary | ICD-10-CM

## 2018-04-09 DIAGNOSIS — Z5181 Encounter for therapeutic drug level monitoring: Secondary | ICD-10-CM

## 2018-04-09 DIAGNOSIS — Z8673 Personal history of transient ischemic attack (TIA), and cerebral infarction without residual deficits: Secondary | ICD-10-CM | POA: Diagnosis not present

## 2018-04-09 DIAGNOSIS — D6861 Antiphospholipid syndrome: Secondary | ICD-10-CM

## 2018-04-09 DIAGNOSIS — Z23 Encounter for immunization: Secondary | ICD-10-CM | POA: Diagnosis not present

## 2018-04-09 LAB — POCT INR: INR: 2.6 (ref 2.0–3.0)

## 2018-04-09 NOTE — Progress Notes (Signed)
Reviewed Thanks DrG 

## 2018-04-09 NOTE — Patient Instructions (Signed)
Patient instructed to take medications as defined in the Anti-coagulation Track section of this encounter.  Patient instructed to take today's dose.  Patient instructed to take 1 & 1/2 tablets of your 5mg  peach-colored warfarin tablets by mouth, once-daily, at [facility] each day.  Patient verbalized understanding of these instructions.

## 2018-04-09 NOTE — Progress Notes (Signed)
Anticoagulation Management Brian Lloyd is a 58 y.o. male who reports to the clinic for monitoring of warfarin treatment.    Indication: CVA, DVT and antiphospholipid antibody syndrome    Duration: indefinite Supervising physician: Cephas Darby  Anticoagulation Clinic Visit History: Patient does not report signs/symptoms of bleeding or thromboembolism  Other recent changes: No diet, medications, lifestyle changes endorsed by patient other than cutting beer out of his diet.   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 Naproxen Anaphylaxis   Prior to Admission medications   Medication Sig Start Date End Date Taking? Authorizing Provider  acetaminophen (TYLENOL) 500 MG tablet Take 1,000 mg by mouth every 6 (six) hours as needed for moderate pain or headache.     [provider]  aspirin EC 81 MG tablet Take 81 mg by mouth daily.    [provider]  enoxaparin (LOVENOX) 80 MG/0.8ML injection Inject 0.8 mLs (80 mg total) into the skin every 12 (twelve) hours for 5 days. 10 syringes to be dispensed for 5 days of therapy. 02/05/18 02/10/18  Elicia Lamp, RPH-CPP  Multiple  Vitamins-Minerals (MULTIVITAMIN PO) Take 1 tablet by mouth daily.    [provider]  psyllium (METAMUCIL) 58.6 % packet Take 1 packet by mouth daily.    [provider]  warfarin (COUMADIN) 5 MG tablet TAKE 5 MG (5 MG X 1) EVERY MON, WED, FRI 7.5 MG (5 MG X 1.5) ALL OTHER DAYS Patient taking differently: Take 5-7.5 mg by mouth See admin instructions. Take 7.5 mg by mouth daily Monday, Wednesday and Friday. Take 5 mg by mouth daily on all other days. 01/01/18   Levert Feinstein, MD   Past Medical History:  Diagnosis Date  . Acute cerebral infarction (HCC) 11/18/2015   Bifrontal embolic infarcts MRI/MRA 11/18/15; known aortic valve vegetation on lovenox/coumadin/ASA  . Antiphospholipid antibody syndrome (HCC) 11/06/2015   11/04/15 significant elevation of IgG anticardiolipin & beta-2-GP-1 antibodies  . 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 Aortic valve mass   . 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 Arthritis    hands  . 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 DVT (deep venous thrombosis) (HCC)    left leg, on Xarelto  . 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 S/P aortic valve replacement with bioprosthetic valve 11/24/2015   25 mm [facility] Ease bovine pericardial tissue valve  . Thrombocytopenia (HCC)  Social History   Socioeconomic History  . Marital status: Married    Spouse name: Not on file  . Number of children: Not on file  . Years of education: Not on file  . Highest education level: Not on file  Occupational History  . Not on file  Social Needs  . Financial resource strain: Not on file  . Food insecurity:    Worry: Not on file    Inability: Not on file  . Transportation needs:    Medical: Not on file    Non-medical: Not on file  Tobacco Use  . Smoking  status: Never Smoker  . Smokeless tobacco: Never Used  Substance and Sexual Activity  . Alcohol use: Not Currently    Alcohol/week: 3.0 standard drinks    Types: 1 Glasses of wine, 1 Cans of beer, 1 Shots of liquor per week  . Drug use: No  . Sexual activity: Yes  Lifestyle  . Physical activity:    Days per week: Not on file    Minutes per session: Not on file  . Stress: Not on file  Relationships  . Social connections:    Talks on phone: Not on file    Gets together: Not on file    Attends religious service: Not on file    Active member of club or organization: Not on file    Attends meetings of clubs or organizations: Not on file    Relationship status: Not on file  Other Topics Concern  . Not on file  Social History Narrative  . Not on file   Family History  Problem Relation Age of Onset  . Heart disease Father   . Hypertension Father   . Heart disease Maternal Grandfather   . Hypertension Maternal Grandfather   . Diabetes Paternal Grandfather   . Heart disease Paternal Grandfather     ASSESSMENT Recent Results: The most recent result is correlated with 52.5 mg per week: Lab Results  Component Value Date   INR 2.6 04/09/2018   INR 2.5 03/19/2018   INR 2.5 02/26/2018    Anticoagulation Dosing: Description   Take 1 & 1/2 tablets of your 5mg  peach-colored warfarin tablets by mouth, once-daily, at [facility] each day.      INR today: Therapeutic  PLAN Weekly dose was unchanged.  Patient Instructions  Patient instructed to take medications as defined in the Anti-coagulation Track section of this encounter.  Patient instructed to take today's dose.  Patient instructed to take 1 & 1/2 tablets of your 5mg  peach-colored warfarin tablets by mouth, once-daily, at [facility] each day.  Patient verbalized understanding of these instructions.      Patient advised to contact clinic or seek medical attention if signs/symptoms of bleeding or thromboembolism occur.  Patient  verbalized understanding by repeating back information and was advised to contact me if further medication-related questions arise. Patient was also provided an information handout.  Follow-up Return in about 3 weeks (around 04/30/2018) for INR follow up.  Ewing Schlein, PharmD PGY1 Pharmacy Resident 04/09/2018    11:15 AM Please check AMION for all [facility] Pharmacy numbers  15 minutes spent face-to-face with the patient during the encounter. 50% of time spent on education, including signs/sx bleeding and clotting, as well as food and drug interactions with warfarin. 50% of time was spent on fingerprick POC INR sample collection,processing, results determination, and documentation in [email or web address].

## 2018-04-18 ENCOUNTER — Other Ambulatory Visit: Payer: Self-pay | Admitting: Pharmacist

## 2018-04-18 ENCOUNTER — Encounter: Payer: Self-pay | Admitting: Pharmacist

## 2018-04-18 DIAGNOSIS — I63119 Cerebral infarction due to embolism of unspecified vertebral artery: Secondary | ICD-10-CM

## 2018-04-18 DIAGNOSIS — D6861 Antiphospholipid syndrome: Secondary | ICD-10-CM

## 2018-04-18 DIAGNOSIS — Z7901 Long term (current) use of anticoagulants: Secondary | ICD-10-CM

## 2018-04-18 DIAGNOSIS — I639 Cerebral infarction, unspecified: Secondary | ICD-10-CM

## 2018-04-18 DIAGNOSIS — Z953 Presence of xenogenic heart valve: Secondary | ICD-10-CM

## 2018-04-18 DIAGNOSIS — Z8673 Personal history of transient ischemic attack (TIA), and cerebral infarction without residual deficits: Secondary | ICD-10-CM

## 2018-04-18 DIAGNOSIS — I825Z2 Chronic embolism and thrombosis of unspecified deep veins of left distal lower extremity: Secondary | ICD-10-CM

## 2018-04-20 ENCOUNTER — Other Ambulatory Visit: Payer: Self-pay | Admitting: *Deleted

## 2018-04-20 DIAGNOSIS — Z953 Presence of xenogenic heart valve: Secondary | ICD-10-CM

## 2018-04-20 DIAGNOSIS — Z8673 Personal history of transient ischemic attack (TIA), and cerebral infarction without residual deficits: Secondary | ICD-10-CM

## 2018-04-20 DIAGNOSIS — I639 Cerebral infarction, unspecified: Secondary | ICD-10-CM

## 2018-04-20 DIAGNOSIS — I825Z2 Chronic embolism and thrombosis of unspecified deep veins of left distal lower extremity: Secondary | ICD-10-CM

## 2018-04-20 DIAGNOSIS — I63119 Cerebral infarction due to embolism of unspecified vertebral artery: Secondary | ICD-10-CM

## 2018-04-20 DIAGNOSIS — D6861 Antiphospholipid syndrome: Secondary | ICD-10-CM

## 2018-04-20 DIAGNOSIS — Z7901 Long term (current) use of anticoagulants: Secondary | ICD-10-CM

## 2018-04-25 ENCOUNTER — Encounter: Payer: BLUE CROSS/BLUE SHIELD | Admitting: Internal Medicine

## 2018-04-25 ENCOUNTER — Other Ambulatory Visit: Payer: Self-pay

## 2018-04-25 NOTE — Progress Notes (Signed)
This encounter was created in error - please disregard.

## 2018-04-30 ENCOUNTER — Ambulatory Visit: Payer: BLUE CROSS/BLUE SHIELD

## 2018-05-02 ENCOUNTER — Encounter: Payer: BLUE CROSS/BLUE SHIELD | Admitting: Internal Medicine

## 2018-05-02 ENCOUNTER — Other Ambulatory Visit: Payer: Self-pay

## 2018-05-03 ENCOUNTER — Telehealth: Payer: Self-pay | Admitting: Hematology

## 2018-05-03 ENCOUNTER — Encounter: Payer: Self-pay | Admitting: Hematology

## 2018-05-03 NOTE — Telephone Encounter (Signed)
New hem appt has been scheduled for the pt to see Dr. Candise Che on 7/7 at 1pm. Letter mailed.

## 2018-05-09 ENCOUNTER — Other Ambulatory Visit: Payer: Self-pay

## 2018-05-09 ENCOUNTER — Ambulatory Visit (INDEPENDENT_AMBULATORY_CARE_PROVIDER_SITE_OTHER): Payer: BLUE CROSS/BLUE SHIELD | Admitting: Internal Medicine

## 2018-05-09 DIAGNOSIS — D6861 Antiphospholipid syndrome: Secondary | ICD-10-CM

## 2018-05-09 DIAGNOSIS — M3211 Endocarditis in systemic lupus erythematosus: Secondary | ICD-10-CM | POA: Diagnosis not present

## 2018-05-09 DIAGNOSIS — H532 Diplopia: Secondary | ICD-10-CM | POA: Diagnosis not present

## 2018-05-09 NOTE — Progress Notes (Signed)
[facility] Telephone Encounter Continuity Care Appointment  HPI:   This telephone encounter was created for [name] on 05/09/2018 for the following purpose/cc: to establish INR checks at home. He has a history of antiphospholipid syndrome diagnosed 2017 after left LE DVT then left cerebellar infarct with no cardiac risk factors while on xarelto, in addition to marantic endocarditis. Subsequently developed bilateral frontal lobe strokes while awaiting cardiac surgery. He has some mild neurological deficits that include occular migraines and occasional vertigo. Bioprosthetic aortic valve was placed 11/2015. His INR goal is 2.5 - 3.5. He previously was followed by Dr. Cyndie ChimeGranfortuna for his anti-phospholipid syndrome and has an appointment to establish care with Dr. Candise CheKale 08/07/18. He would like to [facility] for PCP care and for his INR checks, when necessary, although he is hoping to switch to home INR checks at this time.  He has been doing well at home although he has not attended his recent INR check on 3/18, which has been moved from our clinic temporarily due to covid-19 to a local lab. His warfarin was recently increased to 7.5 daily as his INR had been running on the lower side, around 2.5 and 2.6. He states this increased dose has been working well for him and denies any increase in bleeding. He denies chest pain, shortness of breath, or leg swelling. He has recovered completely from his hernia surgery in January.  He continues to have occasional diplopia and vertigo which have been present since his cerebellar stroke but this is not increased from usual and he states actually seems to be somewhat improved.     Past Medical History:  Past Medical History:  Diagnosis Date  . Acute cerebral infarction (HCC) 11/18/2015   Bifrontal embolic infarcts MRI/MRA 11/18/15; known aortic valve vegetation on lovenox/coumadin/ASA  . Antiphospholipid  antibody syndrome (HCC) 11/06/2015   11/04/15 significant elevation of IgG anticardiolipin & beta-2-GP-1 antibodies  . 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 Aortic valve mass   . 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 Arthritis    hands  . 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 DVT (deep venous thrombosis) (HCC)    left leg, on Xarelto  . 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 S/P aortic valve replacement with bioprosthetic valve 11/24/2015   25 mm [facility] Ease bovine pericardial tissue valve  . Thrombocytopenia (HCC)       ROS:  ROS negative except as noted in HPI.     Assessment / Plan / Recommendations:   Please see A&P under problem oriented charting for assessment of the patient's acute and chronic medical conditions.   As always, pt is advised that if symptoms worsen or new symptoms arise, they should go to an urgent care facility or to to ER for further evaluation.   Consent and Medical Decision Making:   Patient discussed with Dr. Cyndie ChimeGranfortuna  This is a telephone encounter [facility] and Richmond CampbellJaimie A Muzammil Bruins on 05/09/2018 to discuss INR checks for chronic warfarin therapy. The visit was conducted with the patient located at home and [name] at Home. The patient's identity was confirmed using their DOB and current address. The patient has consented to being evaluated through a telephone encounter and understands the associated risks (  an examination cannot be done and the patient may need to come in for an appointment) / benefits (allows the patient to remain at home, decreasing exposure to coronavirus). I personally spent 17 minutes on medical discussion.

## 2018-05-09 NOTE — Assessment & Plan Note (Addendum)
I first saw MR. Krol with Dr. Cyndie Chime in [place] 2020, when the patient decided to transfer to [facility] clinic to establish PCP and to continue his INR checks with [place] as Dr. Reece Agar is planning to retire soon. He has a history of antiphospholipid syndrome diagnosed 2017 with releated left LE DVT, cerebellar infarct while on xarelto, in addition to marantic endocarditis and bilateral frontal lobe strokes while awaiting cardiac surgery. He has some persistent neurological deficits that include occular migraines and occasional vertigo. Bioprosthetic aortic valve was placed 11/2015. His INR goal is currently 2.5 - 3.5. He previously was followed by Dr. Cyndie Chime for his anti-phospholipid syndrome and has an appointment to establish care with Dr. Candise Che 08/07/18. He would like to continue in the [facility] clinic for PCP care and for his INR checks, when necessary, although he is hoping to switch to home INR checks at this time.  He has been doing well at home although he has not attended his recent INR check on 3/18, which has been moved from our clinic temporarily due to covid-19 to a local lab. His warfarin was recently increased to 7.5 daily as his INR had been running on the lower side, around 2.5 and 2.6. He states this increased dose has been working well for him and denies any increase in bleeding. He denies chest pain, shortness of breath, or leg swelling.  - f/u with Dr. Candise Che 7/7 - will send paperwork to him tomorrow to be signed and discuss with Dr. Alexandria Lodge in how to get home equipment  - cont. Warfarin and asa 81 mg

## 2018-05-09 NOTE — Assessment & Plan Note (Signed)
He continues to have occasional diplopia and vertigo but this is not increased from usual and he states actually seems to be somewhat improved.

## 2018-05-10 NOTE — Progress Notes (Signed)
Medicine attending: Medical history, presenting problems,  and medications, reviewed with resident physician Dr Guinevere Scarlet on the day of the patient telephone consultation and I concur with her evaluation and management plan. Patient well known to me. Antiphospholipid antibody syndrome presenting with acute DVT, marantic cerebral emboli and marantic mitral valve endocarditis. On chronic warfarin anticoagulation and concomitant low dose ASA. Re-enforced to patient need for chronic warfarin. NOT a candidate for a NOAC based on unacceptable risk of arterial emboli shown in prospective clinical trials. We will assist patient in initiating a home PT/INR monitoring system supervised through our General Medicine clinic however, he is strongly encouraged to establish with a Hematologist and has a scheduled appointment with Dr Wyvonnia Lora in July. He is at risk for future complications and will also need expert advice re anticoagulation around surgical procedures.

## 2018-05-14 ENCOUNTER — Telehealth: Payer: Self-pay | Admitting: Pharmacist

## 2018-05-14 NOTE — Telephone Encounter (Signed)
Patient had been sent a letter 16-Apr-2018 indicating he was required during COVID-19 Pandemic to have all future INRs performed at [facility] (see chart review documentation). He acknowledged in our conversation he had received letter but elected "not to have his INR performed at [facility].) I reminded him of a requirement of this clinic providing oversight and management of warfarin--is that there must be INRs performed to provide efficacy and safety for warfarin. We discussed a guidance statement promulgated in the supplement to the Journal CHEST (CHEST Guidelines) that permits the frequency of INR testing to be as long as every 12 weeks--provided INR response/warfarin dosing is consistent within targeted INR goal. He is pursuing possibility of fingerstick point of care patient self-testing. He has to complete HIS paperwork (his signature) return to [place] or to [employer] and WE (his PCP) must complete the other part of the application (indication for PST), frequency of testing to be performed, target INR (2.5 - 3.5), to whom the results will be released to, etc. I also discussed with the patient that even though (if he is successful, I.e. his insurance authorizes this) he may perform PST, we would still be responsible for dose adjustment. He acknowledged the same. Indicates he will look in his email to determine if he has received from his PCP the part of the application he must sign.

## 2018-05-14 NOTE — Telephone Encounter (Signed)
Noted. Thanks for reviewing this with the patient. DrG

## 2018-05-15 NOTE — Telephone Encounter (Signed)
Noted Thanks DrG 

## 2018-05-15 NOTE — Telephone Encounter (Signed)
Thank you for the update. He returned his form with signature to me yesterday, and I will be bringing it down to clinic.

## 2018-05-29 DIAGNOSIS — M5136 Other intervertebral disc degeneration, lumbar region: Secondary | ICD-10-CM | POA: Diagnosis not present

## 2018-05-29 DIAGNOSIS — M5441 Lumbago with sciatica, right side: Secondary | ICD-10-CM | POA: Diagnosis not present

## 2018-05-29 DIAGNOSIS — M9903 Segmental and somatic dysfunction of lumbar region: Secondary | ICD-10-CM | POA: Diagnosis not present

## 2018-05-29 DIAGNOSIS — M9904 Segmental and somatic dysfunction of sacral region: Secondary | ICD-10-CM | POA: Diagnosis not present

## 2018-05-31 DIAGNOSIS — M9904 Segmental and somatic dysfunction of sacral region: Secondary | ICD-10-CM | POA: Diagnosis not present

## 2018-05-31 DIAGNOSIS — M9903 Segmental and somatic dysfunction of lumbar region: Secondary | ICD-10-CM | POA: Diagnosis not present

## 2018-05-31 DIAGNOSIS — M5441 Lumbago with sciatica, right side: Secondary | ICD-10-CM | POA: Diagnosis not present

## 2018-05-31 DIAGNOSIS — M5136 Other intervertebral disc degeneration, lumbar region: Secondary | ICD-10-CM | POA: Diagnosis not present

## 2018-06-07 DIAGNOSIS — M5136 Other intervertebral disc degeneration, lumbar region: Secondary | ICD-10-CM | POA: Diagnosis not present

## 2018-06-07 DIAGNOSIS — M9903 Segmental and somatic dysfunction of lumbar region: Secondary | ICD-10-CM | POA: Diagnosis not present

## 2018-06-07 DIAGNOSIS — M5441 Lumbago with sciatica, right side: Secondary | ICD-10-CM | POA: Diagnosis not present

## 2018-06-07 DIAGNOSIS — M9904 Segmental and somatic dysfunction of sacral region: Secondary | ICD-10-CM | POA: Diagnosis not present

## 2018-06-11 DIAGNOSIS — M9904 Segmental and somatic dysfunction of sacral region: Secondary | ICD-10-CM | POA: Diagnosis not present

## 2018-06-11 DIAGNOSIS — M9903 Segmental and somatic dysfunction of lumbar region: Secondary | ICD-10-CM | POA: Diagnosis not present

## 2018-06-11 DIAGNOSIS — M5136 Other intervertebral disc degeneration, lumbar region: Secondary | ICD-10-CM | POA: Diagnosis not present

## 2018-06-11 DIAGNOSIS — M5441 Lumbago with sciatica, right side: Secondary | ICD-10-CM | POA: Diagnosis not present

## 2018-06-14 NOTE — Addendum Note (Signed)
Addended by: Guinevere Scarlet A on: 06/14/2018 07:31 PM   Modules accepted: Orders

## 2018-06-27 ENCOUNTER — Other Ambulatory Visit: Payer: Self-pay | Admitting: Oncology

## 2018-06-27 NOTE — Telephone Encounter (Signed)
Pt has an appt scheduled with Dr Wyvonnia Lora (oncology) in July as Dr.Granfortuna is no longer here. Will send refill to pcp for review.  Please advise.Brian Spittle Cassady5/27/20202:23 PM

## 2018-06-27 NOTE — Telephone Encounter (Signed)
Also tried to call patient but he did not answer.

## 2018-06-27 NOTE — Telephone Encounter (Signed)
Hey Dr. Alexandria Lodge,  I remember your note recently discussing with him that he would need warfarin checks. I put the paperwork through for this to get started at home. Do you know if he is getting his checks at home now?

## 2018-06-28 ENCOUNTER — Telehealth: Payer: Self-pay | Admitting: Pharmacist

## 2018-06-28 NOTE — Telephone Encounter (Signed)
Thank you! I will hold off on refill until appointment time.

## 2018-06-28 NOTE — Telephone Encounter (Signed)
Called patient and discussed his absence from getting INRs performed at [facility] or pursuing patient self-testing with fingerstick point of care device. States he did not want to "come near" healthcare during COVID19. Reminded patient we were pursuing PST POC FS INR testing and he had been emailed a form that he had to sign and return to [employer]. I gave him options of returning to [facility], our clinic or pursuing PST POC FS INR testing. He elects to return to our clinic on Wednesday 04-Jul-2018 at 10:00AM. He states that the warfarin refill request must have been auto-generated by his pharmacy, citing that he has about 20+ tablets in his bottle. He denies any signs or symptoms of bleeding or embolic events. Will see him on 04-Jul-2018 in the [facility].

## 2018-07-04 ENCOUNTER — Ambulatory Visit (INDEPENDENT_AMBULATORY_CARE_PROVIDER_SITE_OTHER): Payer: BC Managed Care – PPO | Admitting: Pharmacist

## 2018-07-04 ENCOUNTER — Other Ambulatory Visit: Payer: Self-pay

## 2018-07-04 DIAGNOSIS — Z8673 Personal history of transient ischemic attack (TIA), and cerebral infarction without residual deficits: Secondary | ICD-10-CM

## 2018-07-04 DIAGNOSIS — I63119 Cerebral infarction due to embolism of unspecified vertebral artery: Secondary | ICD-10-CM

## 2018-07-04 DIAGNOSIS — Z953 Presence of xenogenic heart valve: Secondary | ICD-10-CM

## 2018-07-04 DIAGNOSIS — Z7901 Long term (current) use of anticoagulants: Secondary | ICD-10-CM

## 2018-07-04 DIAGNOSIS — I825Z2 Chronic embolism and thrombosis of unspecified deep veins of left distal lower extremity: Secondary | ICD-10-CM

## 2018-07-04 DIAGNOSIS — I639 Cerebral infarction, unspecified: Secondary | ICD-10-CM

## 2018-07-04 DIAGNOSIS — Z5181 Encounter for therapeutic drug level monitoring: Secondary | ICD-10-CM

## 2018-07-04 DIAGNOSIS — D6861 Antiphospholipid syndrome: Secondary | ICD-10-CM | POA: Diagnosis not present

## 2018-07-04 LAB — POCT INR: INR: 4.3 — AB (ref 2.0–3.0)

## 2018-07-04 NOTE — Progress Notes (Signed)
Anticoagulation Management Brian Lloyd is a 58 y.o. male who reports to the clinic for monitoring of warfarin treatment.    Indication: Acute cerebral infarction (resolved); Antiphospholipid antibody syndrome; 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; Long term current use of anticoagulant.    Duration: indefinite Supervising physician: Erlinda Hong  Anticoagulation Clinic Visit History: Patient does not report signs/symptoms of bleeding or thromboembolism  Other recent changes: No diet, medications, lifestyle changes endorsed.  Anticoagulation Episode Summary    Current INR goal:   2.5-3.5  TTR:   67.5 % (2.3 y)  Next INR check:   07/30/2018  INR from last check:   4.3! 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 Naproxen Anaphylaxis   Prior to Admission medications   Medication Sig Start Date End Date Taking? Authorizing Provider  acetaminophen (TYLENOL) 500 MG tablet Take 1,000 mg by mouth every 6 (six) hours as needed for moderate pain or headache.    Yes [provider]  aspirin EC 81 MG tablet Take 81 mg by mouth daily.   Yes [provider]  Multiple Vitamins-Minerals (MULTIVITAMIN PO) Take 1 tablet by mouth daily.   Yes [provider]  psyllium (METAMUCIL)  58.6 % packet Take 1 packet by mouth daily.   Yes [provider]  warfarin (COUMADIN) 5 MG tablet TAKE 5 MG (5 MG X 1) EVERY MON, WED, FRI 7.5 MG (5 MG X 1.5) ALL OTHER DAYS Patient taking differently: Take 5-7.5 mg by mouth See admin instructions. Take 7.5 mg by mouth daily Monday, Wednesday and Friday. Take 5 mg by mouth daily on all other days. 01/01/18  Yes Levert Feinstein, MD   Past Medical History:  Diagnosis Date  . Acute cerebral infarction (HCC) 11/18/2015   Bifrontal embolic infarcts MRI/MRA 11/18/15; known aortic valve vegetation on lovenox/coumadin/ASA  . Antiphospholipid antibody syndrome (HCC) 11/06/2015   11/04/15 significant elevation of IgG anticardiolipin & beta-2-GP-1 antibodies  . 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 Aortic valve mass   . 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 Arthritis    hands  . 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 DVT (deep venous thrombosis) (HCC)    left leg, on Xarelto  . 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 S/P aortic valve replacement with bioprosthetic valve 11/24/2015   25 mm [facility] Ease bovine pericardial tissue valve  . Thrombocytopenia (HCC)    Social History   Socioeconomic History  . Marital status: Married    Spouse name: Not on file  . Number of children: Not on file  . Years of  education: Not on file  . Highest education level: Not on file  Occupational History  . Not on file  Social Needs  . Financial resource strain: Not on file  . Food insecurity:    Worry: Not on file    Inability: Not on file  . Transportation needs:    Medical: Not on file    Non-medical: Not on file  Tobacco Use  . Smoking status: Never Smoker  . Smokeless tobacco: Never Used  Substance and Sexual Activity  . Alcohol use: Not  Currently    Alcohol/week: 3.0 standard drinks    Types: 1 Glasses of wine, 1 Cans of beer, 1 Shots of liquor per week  . Drug use: No  . Sexual activity: Yes  Lifestyle  . Physical activity:    Days per week: Not on file    Minutes per session: Not on file  . Stress: Not on file  Relationships  . Social connections:    Talks on phone: Not on file    Gets together: Not on file    Attends religious service: Not on file    Active member of club or organization: Not on file    Attends meetings of clubs or organizations: Not on file    Relationship status: Not on file  Other Topics Concern  . Not on file  Social History Narrative  . Not on file   Family History  Problem Relation Age of Onset  . Heart disease Father   . Hypertension Father   . Heart disease Maternal Grandfather   . Hypertension Maternal Grandfather   . Diabetes Paternal Grandfather   . Heart disease Paternal Grandfather     ASSESSMENT Recent Results: The most recent result is correlated with 52.5 mg per week: Lab Results  Component Value Date   INR 4.3 (A) 07/04/2018   INR 2.6 04/09/2018   INR 2.5 03/19/2018    Anticoagulation Dosing: Description   Take 1 & 1/2 tablets of your 5mg  peach-colored warfarin tablets by mouth, once-daily, at 6PM each day--EXCEPT on Sundays and Wednesdays--take ONLY ONE (1) tablet on Sundays and Wednesdays.      INR today: Supratherapeutic  PLAN Weekly dose was decreased by 9.5% to 47.5 mg per week  Patient Instructions  Patient instructed to take medications as defined in the Anti-coagulation Track section of this encounter.  Patient instructed to take today's dose.  Patient instructed to take 1 & 1/2 tablets of your 5mg  peach-colored warfarin tablets by mouth, once-daily, at 6PM each day--EXCEPT on Sundays and Wednesdays--take ONLY ONE (1) tablet on Sundays and Wednesdays.  Patient verbalized understanding of these instructions.     Patient advised to contact clinic  or seek medical attention if signs/symptoms of bleeding or thromboembolism occur.  Patient verbalized understanding by repeating back information and was advised to contact me if further medication-related questions arise. Patient was also provided an information handout.  Follow-up Return in 26 days (on 07/30/2018) [facility] at 1000[facility].  Elicia LampJames B Ruthia Person, PharmD, CPP  15 minutes spent face-to-face with the patient during the encounter. 50% of time spent on education, including signs/sx bleeding and clotting, as well as food and drug interactions with warfarin. 50% of time was spent on fingerprick POC INR sample collection,processing, results determination, and documentation in [email or web address].

## 2018-07-04 NOTE — Progress Notes (Signed)
INTERNAL MEDICINE TEACHING ATTENDING ADDENDUM ° °I agree with pharmacy recommendations as outlined in their note.  ° °-Duncan Vincent MD ° °

## 2018-07-04 NOTE — Patient Instructions (Signed)
Patient instructed to take medications as defined in the Anti-coagulation Track section of this encounter.  Patient instructed to take today's dose.  Patient instructed to take 1 & 1/2 tablets of your 5mg  peach-colored warfarin tablets by mouth, once-daily, at 6PM each day--EXCEPT on Sundays and Wednesdays--take ONLY ONE (1) tablet on Sundays and Wednesdays.  Patient verbalized understanding of these instructions.

## 2018-07-06 ENCOUNTER — Other Ambulatory Visit: Payer: Self-pay | Admitting: *Deleted

## 2018-07-30 ENCOUNTER — Other Ambulatory Visit: Payer: Self-pay

## 2018-07-30 ENCOUNTER — Ambulatory Visit (INDEPENDENT_AMBULATORY_CARE_PROVIDER_SITE_OTHER): Payer: BC Managed Care – PPO | Admitting: Pharmacist

## 2018-07-30 DIAGNOSIS — I639 Cerebral infarction, unspecified: Secondary | ICD-10-CM

## 2018-07-30 DIAGNOSIS — I63119 Cerebral infarction due to embolism of unspecified vertebral artery: Secondary | ICD-10-CM

## 2018-07-30 DIAGNOSIS — D6861 Antiphospholipid syndrome: Secondary | ICD-10-CM

## 2018-07-30 DIAGNOSIS — Z5181 Encounter for therapeutic drug level monitoring: Secondary | ICD-10-CM

## 2018-07-30 DIAGNOSIS — Z8673 Personal history of transient ischemic attack (TIA), and cerebral infarction without residual deficits: Secondary | ICD-10-CM

## 2018-07-30 DIAGNOSIS — Z7901 Long term (current) use of anticoagulants: Secondary | ICD-10-CM

## 2018-07-30 DIAGNOSIS — I825Z2 Chronic embolism and thrombosis of unspecified deep veins of left distal lower extremity: Secondary | ICD-10-CM | POA: Diagnosis not present

## 2018-07-30 DIAGNOSIS — Z953 Presence of xenogenic heart valve: Secondary | ICD-10-CM

## 2018-07-30 LAB — POCT INR: INR: 3.8 — AB (ref 2.0–3.0)

## 2018-07-30 NOTE — Patient Instructions (Signed)
Patient instructed to take medications as defined in the Anti-coagulation Track section of this encounter.  Patient instructed to take today's dose.  Patient instructed to take 1 & 1/2 tablets of your 5mg  peach-colored warfarin tablets by mouth, once-daily, at 6PM each day on Sundays, Tuesdays, Thursdays and Saturdays. On Mondays, Wednesdays and Fridays, take only one (1) tablet.  Patient verbalized understanding of these instructions.

## 2018-07-30 NOTE — Progress Notes (Signed)
Anticoagulation Management Brian Lloyd is a 58 y.o. male who reports to the clinic for monitoring of warfarin treatment.    Indication: CVA (History of); Antiphospholipid Antibody Syndrome, Chronic DVT; Target INR goal 2.5 - 3.5 as established by Hematology (Dr. Cyndie ChimeGranfortuna).   Duration: indefinite Supervising physician: Jessy Otoaines, Alexander, MD PhD  Anticoagulation Clinic Visit History: Patient does not report signs/symptoms of bleeding or thromboembolism  Other recent changes: No diet, medications, lifestyle changes.  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 Naproxen Anaphylaxis    Current Outpatient Medications:  .  acetaminophen (TYLENOL) 500 MG tablet, Take 1,000 mg by mouth every 6 (six) hours as needed for moderate pain or headache. , Disp: , Rfl:  .  aspirin EC 81 MG tablet, Take 81 mg by mouth daily., Disp: , Rfl:  .  Multiple Vitamins-Minerals (MULTIVITAMIN PO), Take 1 tablet by mouth daily., Disp: , Rfl:  .  psyllium (METAMUCIL) 58.6 % packet, Take 1 packet by mouth daily., Disp: , Rfl:  .  warfarin (COUMADIN) 5 MG tablet, TAKE 5 MG (5 MG X 1) EVERY MON, WED, FRI 7.5 MG (5 MG X 1.5)  ALL OTHER DAYS (Patient taking differently: Take 5-7.5 mg by mouth See admin instructions. Take 7.5 mg by mouth daily Monday, Wednesday and Friday. Take 5 mg by mouth daily on all other days.), Disp: 45 tablet, Rfl: 6 Past Medical History:  Diagnosis Date  . Acute cerebral infarction (HCC) 11/18/2015   Bifrontal embolic infarcts MRI/MRA 11/18/15; known aortic valve vegetation on lovenox/coumadin/ASA  . Antiphospholipid antibody syndrome (HCC) 11/06/2015   11/04/15 significant elevation of IgG anticardiolipin & beta-2-GP-1 antibodies  . 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 Aortic valve mass   . 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 Arthritis    hands  . 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 DVT (deep venous thrombosis) (HCC)    left leg, on Xarelto  . 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 S/P aortic valve replacement with bioprosthetic valve 11/24/2015   25 mm [facility] bovine pericardial tissue valve  . Thrombocytopenia (HCC)    Social History   Socioeconomic History  . Marital status: Married    Spouse name: Not on file  . Number of children: Not on file  . Years of education: Not on file  . Highest education level: Not on file  Occupational History  . Not on file  Social Needs  . Financial resource strain: Not on file  .  Food insecurity    Worry: Not on file    Inability: Not on file  . Transportation needs    Medical: Not on file    Non-medical: Not on file  Tobacco Use  . Smoking status: Never Smoker  . Smokeless tobacco: Never Used  Substance and Sexual Activity  . Alcohol use: Not Currently    Alcohol/week: 3.0 standard drinks    Types: 1 Glasses of wine, 1 Cans of beer, 1 Shots of liquor per week  . Drug use: No  . Sexual activity: Yes  Lifestyle  . Physical  activity    Days per week: Not on file    Minutes per session: Not on file  . Stress: Not on file  Relationships  . Social Herbalist on phone: Not on file    Gets together: Not on file    Attends religious service: Not on file    Active member of club or organization: Not on file    Attends meetings of clubs or organizations: Not on file    Relationship status: Not on file  Other Topics Concern  . Not on file  Social History Narrative  . Not on file   Family History  Problem Relation Age of Onset  . Heart disease Father   . Hypertension Father   . Heart disease Maternal Grandfather   . Hypertension Maternal Grandfather   . Diabetes Paternal Grandfather   . Heart disease Paternal Grandfather     ASSESSMENT Recent Results: The most recent result is correlated with 47.5 mg per week: Lab Results  Component Value Date   INR 3.8 (A) 07/30/2018   INR 4.3 (A) 07/04/2018   INR 2.6 04/09/2018    Anticoagulation Dosing: Description   Take 1 & 1/2 tablets of your 5mg  peach-colored warfarin tablets by mouth, once-daily, at 6PM each day on Sundays, Tuesdays, Thursdays and Saturdays. On Mondays, Wednesdays and Fridays, take only one (1) tablet.      INR today: Supratherapeutic  PLAN Weekly dose was decreased by 5% to 45 mg per week  Patient Instructions  Patient instructed to take medications as defined in the Anti-coagulation Track section of this encounter.  Patient instructed to take today's dose.  Patient instructed to take 1 & 1/2 tablets of your 5mg  peach-colored warfarin tablets by mouth, once-daily, at 6PM each day on Sundays, Tuesdays, Thursdays and Saturdays. On Mondays, Wednesdays and Fridays, take only one (1) tablet.  Patient verbalized understanding of these instructions.     Patient advised to contact clinic or seek medical attention if signs/symptoms of bleeding or thromboembolism occur.  Patient verbalized understanding by repeating back  information and was advised to contact me if further medication-related questions arise. Patient was also provided an information handout.  Follow-up Return in about 4 weeks (around 08/27/2018) for Follow up INR.  Pennie Banter, PharmD, CPP  15 minutes spent face-to-face with the patient during the encounter. 50% of time spent on education, including signs/sx bleeding and clotting, as well as food and drug interactions with warfarin. 50% of time was spent on fingerprick POC INR sample collection,processing, results determination, and documentation in [email or web address]

## 2018-07-30 NOTE — Progress Notes (Signed)
INTERNAL MEDICINE TEACHING ATTENDING ADDENDUM  I agree with pharmacy recommendations as outlined in their note.   Alexander N Raines, MD  

## 2018-08-07 ENCOUNTER — Other Ambulatory Visit: Payer: BC Managed Care – PPO

## 2018-08-07 ENCOUNTER — Inpatient Hospital Stay: Payer: BC Managed Care – PPO | Admitting: Hematology

## 2018-08-07 ENCOUNTER — Other Ambulatory Visit: Payer: Self-pay | Admitting: Pharmacist

## 2018-08-07 DIAGNOSIS — D6861 Antiphospholipid syndrome: Secondary | ICD-10-CM

## 2018-08-07 DIAGNOSIS — I63119 Cerebral infarction due to embolism of unspecified vertebral artery: Secondary | ICD-10-CM

## 2018-08-27 ENCOUNTER — Other Ambulatory Visit: Payer: Self-pay

## 2018-08-27 ENCOUNTER — Ambulatory Visit (INDEPENDENT_AMBULATORY_CARE_PROVIDER_SITE_OTHER): Payer: BC Managed Care – PPO | Admitting: Pharmacist

## 2018-08-27 DIAGNOSIS — Z953 Presence of xenogenic heart valve: Secondary | ICD-10-CM

## 2018-08-27 DIAGNOSIS — I825Z2 Chronic embolism and thrombosis of unspecified deep veins of left distal lower extremity: Secondary | ICD-10-CM

## 2018-08-27 DIAGNOSIS — I63119 Cerebral infarction due to embolism of unspecified vertebral artery: Secondary | ICD-10-CM

## 2018-08-27 DIAGNOSIS — Z8673 Personal history of transient ischemic attack (TIA), and cerebral infarction without residual deficits: Secondary | ICD-10-CM | POA: Diagnosis not present

## 2018-08-27 DIAGNOSIS — Z7901 Long term (current) use of anticoagulants: Secondary | ICD-10-CM | POA: Diagnosis not present

## 2018-08-27 DIAGNOSIS — D6861 Antiphospholipid syndrome: Secondary | ICD-10-CM | POA: Diagnosis not present

## 2018-08-27 DIAGNOSIS — Z5181 Encounter for therapeutic drug level monitoring: Secondary | ICD-10-CM

## 2018-08-27 DIAGNOSIS — I639 Cerebral infarction, unspecified: Secondary | ICD-10-CM

## 2018-08-27 LAB — POCT INR: INR: 1.9 — AB (ref 2.0–3.0)

## 2018-08-27 NOTE — Progress Notes (Signed)
I reviewed Dr. Gladstone Pih note.  Patient is on [facility] for APL and h/o VTE.  His INR was low.  Lovenox provided and coumadin increased.  Close follow up noted.

## 2018-08-27 NOTE — Progress Notes (Signed)
Anticoagulation Management Brian Lloyd is a 58 y.o. male who reports to the clinic for monitoring of warfarin treatment.    Indication: Acute cerebral infarction-resolved; Antiphospholipid Antibodoy Syndrome; Chronic Deep-vein thrombosis (history of); Current long term use of anticoagulant.    Duration: indefinite Supervising physician: [place] [facility] Visit History: Patient does not report signs/symptoms of bleeding or thromboembolism  Other recent changes: No diet, medications, lifestyle changes.  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 Naproxen Anaphylaxis    Current Outpatient Medications:  .  acetaminophen (TYLENOL) 500 MG tablet, Take 1,000 mg by mouth every 6 (six) hours as needed for moderate pain or headache. , Disp: , Rfl:  .  aspirin EC 81 MG tablet, Take 81 mg by mouth daily., Disp: , Rfl:  .  Multiple Vitamins-Minerals (MULTIVITAMIN PO), Take 1 tablet by mouth daily., Disp: , Rfl:  .  psyllium (METAMUCIL) 58.6 % packet, Take 1 packet by mouth daily., Disp: , Rfl:  .  warfarin (COUMADIN) 5 MG tablet, TAKE 5 MG (5 MG X 1) EVERY MON, WED, FRI 7.5 MG (5 MG X 1.5) ALL OTHER  DAYS, Disp: 135 tablet, Rfl: 2 Past Medical History:  Diagnosis Date  . Acute cerebral infarction ([place]) 11/18/2015   Bifrontal embolic infarcts MRI/MRA 11/18/15; known aortic valve vegetation on lovenox/coumadin/ASA  . Antiphospholipid antibody syndrome ([place]) 11/06/2015   11/04/15 significant elevation of IgG anticardiolipin & beta-2-GP-1 antibodies  . 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 Aortic valve mass   . 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 Arthritis    hands  . Cerebellar infarct ([place]) 10/06/2015   Cardiac MRI showed acute to subacute lacunar infarct in the left cerebellum  . DVT (deep venous thrombosis) (HCC)    left leg, on Xarelto  . DVT, lower extremity, distal, chronic ([place]) 08/2015   Left calf DVT following injury  . Libman-Sacks endocarditis ([place])   . S/P aortic valve replacement with bioprosthetic valve 11/24/2015   25 mm [facility] Ease bovine pericardial tissue valve  . Thrombocytopenia ([place])    Social History   Socioeconomic History  . Marital status: Married    Spouse name: Not on file  . Number of children: Not on file  . Years of education: Not on file  . Highest education level: Not on file  Occupational History  . Not on file  Social Needs  . Financial resource strain: Not on file  . Food insecurity    Worry: Not on file    Inability: Not on file  . Transportation needs    Medical: Not on file    Non-medical: Not on file  Tobacco Use  . Smoking status: Never Smoker  . Smokeless tobacco: Never Used  Substance and Sexual Activity  . Alcohol use: Not Currently    Alcohol/week: 3.0 standard drinks    Types: 1 Glasses of wine, 1 Cans of beer, 1 Shots of liquor per week  . Drug use: No  . Sexual activity: Yes  Lifestyle  . Physical activity    Days per week: Not on file    Minutes per session: Not on file  . Stress: Not on file  Relationships  . Social Musicianconnections    Talks on phone: Not on file    Gets together:  Not on file    Attends religious service: Not on file    Active member of club or organization: Not on file    Attends meetings of clubs or organizations: Not on file    Relationship status: Not on file  Other Topics Concern  . Not on file  Social History Narrative  . Not on file   Family History  Problem Relation Age of Onset  . Heart disease Father   . Hypertension Father   . Heart disease Maternal Grandfather   . Hypertension Maternal Grandfather   . Diabetes Paternal Grandfather   . Heart disease Paternal Grandfather     ASSESSMENT Recent Results: The most recent result is correlated with 45 mg per week: Lab Results  Component Value Date   INR 1.9 (A) 08/27/2018   INR 3.8 (A) 07/30/2018   INR 4.3 (A) 07/04/2018    Anticoagulation Dosing: Description   Take 1 & 1/2 tablets of your 5mg  peach-colored warfarin tablets by mouth, once-daily, at 6PM each day EXCEPT on Wednesdays, take only one (1) table on Wendesdays.  Inject 80mg  of enoxaparin/Lovenox (which you have at home from previous use) at level of waist-band, 2 inches away from your belly-button, every 12 hours until seen in Clinic on Friday 31-Aug-2018.      INR today: Subtherapeutic  PLAN Weekly dose was increased by 11% to 50 mg per week. Patient was placed upon an enoxaparin/Lovenox "Bridge" with 1mg /kg SQ q12h (80mg  dose, which he has on-hand from previous requirement of bridge therapy) until such time the INR is therapeutic. Will re-evaluate INR response on the new warfarin regimen on Friday 31-Aug-2018.   Patient Instructions  Patient instructed to take medications as defined in the Anti-coagulation Track section of this encounter.  Patient instructed to take today's dose.  Patient instructed to administer enoxaparin/Lovenox "Bridge" with 1mg /kg SQ q12h (80mg  dose, which he has on-hand from previous requirement of bridge therapy) until such time the INR is therapeutic. Will re-evaluate INR response on the new  warfarin regimen on Friday 31-Aug-2018.  Patient verbalized understanding of these instructions.     Patient advised to contact clinic or seek medical attention if signs/symptoms of bleeding or thromboembolism occur.  Patient verbalized understanding by repeating back information and was advised to contact me if further medication-related questions arise. Patient was also provided an information handout.  Follow-up Return in 4 days (on 08/31/2018) for Follow up INR.  Elicia LampJames B Khayla Koppenhaver, PharmD, CPP  15 minutes spent face-to-face with the patient during the encounter. 50% of time spent on education, including signs/sx bleeding and clotting, as well as food and drug interactions with warfarin. 50% of time was spent on fingerprick POC INR sample collection,processing, results determination, and documentation in [email or web address].

## 2018-08-27 NOTE — Patient Instructions (Signed)
Patient instructed to take medications as defined in the Anti-coagulation Track section of this encounter.  Patient instructed to take today's dose.  Patient instructed to administer enoxaparin/Lovenox "Bridge" with 1mg /kg SQ q12h (80mg  dose, which he has on-hand from previous requirement of bridge therapy) until such time the INR is therapeutic. Will re-evaluate INR response on the new warfarin regimen on Friday 31-Aug-2018.  Patient verbalized understanding of these instructions.

## 2018-08-28 ENCOUNTER — Other Ambulatory Visit: Payer: Self-pay | Admitting: Pharmacist

## 2018-08-28 MED ORDER — ENOXAPARIN SODIUM 80 MG/0.8ML ~~LOC~~ SOLN: 80.0000 mg | Freq: Two times a day (BID) | SUBCUTANEOUS | 0 refills | Status: DC

## 2018-08-31 ENCOUNTER — Ambulatory Visit (INDEPENDENT_AMBULATORY_CARE_PROVIDER_SITE_OTHER): Payer: BC Managed Care – PPO | Admitting: Pharmacist

## 2018-08-31 ENCOUNTER — Other Ambulatory Visit: Payer: Self-pay

## 2018-08-31 DIAGNOSIS — Z7901 Long term (current) use of anticoagulants: Secondary | ICD-10-CM | POA: Diagnosis not present

## 2018-08-31 DIAGNOSIS — D6861 Antiphospholipid syndrome: Secondary | ICD-10-CM | POA: Diagnosis not present

## 2018-08-31 DIAGNOSIS — Z5181 Encounter for therapeutic drug level monitoring: Secondary | ICD-10-CM

## 2018-08-31 DIAGNOSIS — I63119 Cerebral infarction due to embolism of unspecified vertebral artery: Secondary | ICD-10-CM

## 2018-08-31 DIAGNOSIS — Z8673 Personal history of transient ischemic attack (TIA), and cerebral infarction without residual deficits: Secondary | ICD-10-CM | POA: Diagnosis not present

## 2018-08-31 DIAGNOSIS — I825Z2 Chronic embolism and thrombosis of unspecified deep veins of left distal lower extremity: Secondary | ICD-10-CM | POA: Diagnosis not present

## 2018-08-31 DIAGNOSIS — I639 Cerebral infarction, unspecified: Secondary | ICD-10-CM

## 2018-08-31 DIAGNOSIS — Z953 Presence of xenogenic heart valve: Secondary | ICD-10-CM

## 2018-08-31 LAB — POCT INR: INR: 3 (ref 2.0–3.0)

## 2018-08-31 NOTE — Progress Notes (Signed)
Anticoagulation Management Brian Lloyd is a 58 y.o. male who reports to the clinic for monitoring of warfarin treatment.    Indication: History of acute cerebral infarction; Antiphospholipid Antibody Syndrome; History of chronic deep vein thrombosis; Long term (current) use of anticoagulant.    Duration: indefinite Supervising physician: Joni Reining  Anticoagulation Clinic Visit History: Patient does not report signs/symptoms of bleeding or thromboembolism  Other recent changes: No diet, medications, lifestyle changes.  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 Naproxen Anaphylaxis    Current Outpatient Medications:  .  acetaminophen (TYLENOL) 500 MG tablet, Take 1,000 mg by mouth every 6 (six) hours as needed for moderate pain or headache. , Disp: , Rfl:  .  aspirin EC 81 MG tablet, Take 81 mg by mouth daily., Disp: , Rfl:  .  enoxaparin (LOVENOX) 80 MG/0.8ML injection, Inject 0.8 mLs (80 mg total) into the skin every 12 (twelve) hours for 5 days., Disp: 8 mL, Rfl: 0 .  Multiple Vitamins-Minerals (MULTIVITAMIN PO), Take 1 tablet by mouth daily., Disp: , Rfl:  .  psyllium (METAMUCIL) 58.6 %  packet, Take 1 packet by mouth daily., Disp: , Rfl:  .  warfarin (COUMADIN) 5 MG tablet, TAKE 5 MG (5 MG X 1) EVERY MON, WED, FRI 7.5 MG (5 MG X 1.5) ALL OTHER DAYS, Disp: 135 tablet, Rfl: 2 Past Medical History:  Diagnosis Date  . Acute cerebral infarction ([place]) 11/18/2015   Bifrontal embolic infarcts MRI/MRA 11/18/15; known aortic valve vegetation on lovenox/coumadin/ASA  . Antiphospholipid antibody syndrome ([place]) 11/06/2015   11/04/15 significant elevation of IgG anticardiolipin & beta-2-GP-1 antibodies  . 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 Aortic valve mass   . 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 Arthritis    hands  . Cerebellar infarct ([place]) 10/06/2015   Cardiac MRI showed acute to subacute lacunar infarct in the left cerebellum  . DVT (deep venous thrombosis) (HCC)    left leg, on Xarelto  . DVT, lower extremity, distal, chronic ([place]) 08/2015   Left calf DVT following injury  . Libman-Sacks endocarditis ([place])   . S/P aortic valve replacement with bioprosthetic valve 11/24/2015   25 mm [facility] Ease bovine pericardial tissue valve  . Thrombocytopenia ([place])    Social History   Socioeconomic History  . Marital status: Married    Spouse name: Not on file  . Number of children: Not on file  . Years of education: Not on file  . Highest education level: Not on file  Occupational History  . Not on file  Social Needs  . Financial resource strain: Not on file  . Food insecurity  Worry: Not on file    Inability: Not on file  . Transportation needs    Medical: Not on file    Non-medical: Not on file  Tobacco Use  . Smoking status: Never Smoker  . Smokeless tobacco: Never Used  Substance and Sexual Activity  . Alcohol use: Not Currently    Alcohol/week: 3.0 standard drinks    Types: 1 Glasses of wine, 1 Cans of beer, 1 Shots of liquor per week  . Drug use: No  . Sexual activity: Yes  Lifestyle  . Physical activity    Days per week:  Not on file    Minutes per session: Not on file  . Stress: Not on file  Relationships  . Social Musicianconnections    Talks on phone: Not on file    Gets together: Not on file    Attends religious service: Not on file    Active member of club or organization: Not on file    Attends meetings of clubs or organizations: Not on file    Relationship status: Not on file  Other Topics Concern  . Not on file  Social History Narrative  . Not on file   Family History  Problem Relation Age of Onset  . Heart disease Father   . Hypertension Father   . Heart disease Maternal Grandfather   . Hypertension Maternal Grandfather   . Diabetes Paternal Grandfather   . Heart disease Paternal Grandfather     ASSESSMENT Recent Results: The most recent result is correlated with 50 mg per week WITH LOVENOX Bridge Therapy: Lab Results  Component Value Date   INR 3.0 08/31/2018   INR 1.9 (A) 08/27/2018   INR 3.8 (A) 07/30/2018    Anticoagulation Dosing: Description   Take 1 & 1/2 tablets of your 5mg  peach-colored warfarin tablets by mouth, once-daily, at 6PM each day EXCEPT on Wednesdays, take only one (1) table on Wendesdays.       INR today: Therapeutic  PLAN Weekly dose was unchanged, will remain on 50mg  week/warfarin (7.5mg  day, except on Wednesdays, only 5mg ). DISCONTINUE Lovenox Bridge Therapy AFTER tonight's 10:00PM dose of Lovenox.   Patient Instructions  Patient instructed to take medications as defined in the Anti-coagulation Track section of this encounter.  Patient instructed to take today's dose.  Patient instructed to take 1 & 1/2 tablets of your 5mg  peach-colored warfarin tablets by mouth, once-daily, at 6PM each day EXCEPT on Wednesdays, take only one (1) table on Wendesdays. Patient instructed to DISCONTINUE Lovenox injection(s) AFTER TONIGHT'S 10:00PM dose.  Patient verbalized understanding of these instructions.     Patient advised to contact clinic or seek medical attention if  signs/symptoms of bleeding or thromboembolism occur.  Patient verbalized understanding by repeating back information and was advised to contact me if further medication-related questions arise. Patient was also provided an information handout.  Follow-up Return in 17 days (on 09/17/2018) for Follow up INR.  Elicia LampJames B Cash Duce, PharmD, CPP  15 minutes spent face-to-face with the patient during the encounter. 50% of time spent on education, including signs/sx bleeding and clotting, as well as food and drug interactions with warfarin. 50% of time was spent on fingerprick POC INR sample collection,processing, results determination, and documentation in [email or web address].

## 2018-08-31 NOTE — Patient Instructions (Signed)
Patient instructed to take medications as defined in the Anti-coagulation Track section of this encounter.  Patient instructed to take today's dose.  Patient instructed to take 1 & 1/2 tablets of your 5mg  peach-colored warfarin tablets by mouth, once-daily, at 6PM each day EXCEPT on Wednesdays, take only one (1) table on Wendesdays. Patient instructed to DISCONTINUE Lovenox injection(s) AFTER TONIGHT'S 10:00PM dose.  Patient verbalized understanding of these instructions.

## 2018-09-05 DIAGNOSIS — Z7901 Long term (current) use of anticoagulants: Secondary | ICD-10-CM | POA: Diagnosis not present

## 2018-09-05 DIAGNOSIS — D6861 Antiphospholipid syndrome: Secondary | ICD-10-CM | POA: Diagnosis not present

## 2018-09-17 ENCOUNTER — Other Ambulatory Visit: Payer: Self-pay

## 2018-09-17 ENCOUNTER — Ambulatory Visit (INDEPENDENT_AMBULATORY_CARE_PROVIDER_SITE_OTHER): Payer: BC Managed Care – PPO | Admitting: Pharmacist

## 2018-09-17 DIAGNOSIS — D6861 Antiphospholipid syndrome: Secondary | ICD-10-CM | POA: Diagnosis not present

## 2018-09-17 DIAGNOSIS — I825Z2 Chronic embolism and thrombosis of unspecified deep veins of left distal lower extremity: Secondary | ICD-10-CM

## 2018-09-17 DIAGNOSIS — Z952 Presence of prosthetic heart valve: Secondary | ICD-10-CM

## 2018-09-17 DIAGNOSIS — Z5181 Encounter for therapeutic drug level monitoring: Secondary | ICD-10-CM

## 2018-09-17 DIAGNOSIS — I639 Cerebral infarction, unspecified: Secondary | ICD-10-CM

## 2018-09-17 DIAGNOSIS — Z7901 Long term (current) use of anticoagulants: Secondary | ICD-10-CM | POA: Diagnosis not present

## 2018-09-17 DIAGNOSIS — Z8673 Personal history of transient ischemic attack (TIA), and cerebral infarction without residual deficits: Secondary | ICD-10-CM

## 2018-09-17 DIAGNOSIS — I63119 Cerebral infarction due to embolism of unspecified vertebral artery: Secondary | ICD-10-CM

## 2018-09-17 DIAGNOSIS — Z953 Presence of xenogenic heart valve: Secondary | ICD-10-CM

## 2018-09-17 LAB — POCT INR: INR: 3.3 — AB (ref 2.0–3.0)

## 2018-09-17 NOTE — Patient Instructions (Signed)
Patient instructed to take medications as defined in the Anti-coagulation Track section of this encounter.  Patient instructed to take today's dose.  Patient instructed to take  1 & 1/2 tablets of your 5mg  peach-colored warfarin tablets by mouth, once-daily, at 6PM each day EXCEPT on Wednesdays and Thursdays, take only one (1) tablet on Wendesdays and Thursdays.  Patient verbalized understanding of these instructions.

## 2018-09-17 NOTE — Progress Notes (Signed)
Anticoagulation Management Brian Lloyd is a 58 y.o. male who reports to the clinic for monitoring of warfarin treatment.    Indication: History of acute cerebral infarction; Antiphospholipid antibody syndrome; History of chronic deep vein thrombosis; Long term current use of antiocoagulant.    Duration: indefinite Supervising physician: Earl LagosNischal Narendra  Anticoagulation Clinic Visit History: Patient does not report signs/symptoms of bleeding or thromboembolism  Other recent changes: No diet, medications, lifestyle changes endorsed at this visit.  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 Naproxen Anaphylaxis    Current Outpatient Medications:  .  acetaminophen (TYLENOL) 500 MG tablet, Take 1,000 mg by mouth every 6 (six) hours as needed for moderate pain or headache. , Disp: , Rfl:  .  aspirin EC 81 MG tablet, Take 81 mg by mouth daily., Disp: , Rfl:  .  Multiple Vitamins-Minerals (MULTIVITAMIN PO), Take 1 tablet by mouth daily., Disp: , Rfl:  .  psyllium (METAMUCIL) 58.6 % packet, Take 1 packet by mouth daily., Disp: , Rfl:  .  warfarin (COUMADIN) 5 MG tablet, TAKE 5 MG (5 MG X 1) EVERY MON,  WED, FRI 7.5 MG (5 MG X 1.5) ALL OTHER DAYS, Disp: 135 tablet, Rfl: 2 .  enoxaparin (LOVENOX) 80 MG/0.8ML injection, Inject 0.8 mLs (80 mg total) into the skin every 12 (twelve) hours for 5 days., Disp: 8 mL, Rfl: 0 Past Medical History:  Diagnosis Date  . Acute cerebral infarction (HCC) 11/18/2015   Bifrontal embolic infarcts MRI/MRA 11/18/15; known aortic valve vegetation on lovenox/coumadin/ASA  . Antiphospholipid antibody syndrome (HCC) 11/06/2015   11/04/15 significant elevation of IgG anticardiolipin & beta-2-GP-1 antibodies  . 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 Aortic valve mass   . 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 Arthritis    hands  . 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 DVT (deep venous thrombosis) (HCC)    left leg, on Xarelto  . 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 S/P aortic valve replacement with bioprosthetic valve 11/24/2015   [facility] bovine pericardial tissue valve  . Thrombocytopenia (HCC)    Social History   Socioeconomic History  . Marital status: Married    Spouse name: Not on file  . Number of children: Not on file  . Years of education: Not on file  . Highest education level: Not on file  Occupational History  . Not on file  Social Needs  . Financial resource strain: Not on file  .  Food insecurity    Worry: Not on file    Inability: Not on file  . Transportation needs    Medical: Not on file    Non-medical: Not on file  Tobacco Use  . Smoking status: Never Smoker  . Smokeless tobacco: Never Used  Substance and Sexual Activity  . Alcohol use: Not Currently    Alcohol/week: 3.0 standard drinks    Types: 1 Glasses of wine, 1 Cans of beer, 1 Shots of liquor per week  . Drug use: No  . Sexual activity: Yes  Lifestyle  . Physical  activity    Days per week: Not on file    Minutes per session: Not on file  . Stress: Not on file  Relationships  . Social Herbalist on phone: Not on file    Gets together: Not on file    Attends religious service: Not on file    Active member of club or organization: Not on file    Attends meetings of clubs or organizations: Not on file    Relationship status: Not on file  Other Topics Concern  . Not on file  Social History Narrative  . Not on file   Family History  Problem Relation Age of Onset  . Heart disease Father   . Hypertension Father   . Heart disease Maternal Grandfather   . Hypertension Maternal Grandfather   . Diabetes Paternal Grandfather   . Heart disease Paternal Grandfather     ASSESSMENT Recent Results: The most recent result is correlated with 47.5 mg per week: Lab Results  Component Value Date   INR 3.3 (A) 09/17/2018   INR 3.0 08/31/2018   INR 1.9 (A) 08/27/2018    Anticoagulation Dosing: Description   Take 1 & 1/2 tablets of your 5mg  peach-colored warfarin tablets by mouth, once-daily, at 6PM each day EXCEPT on Wednesdays and Thursdays, take only one (1) tablet on Wendesdays and Thursdays.       INR today: Therapeutic  PLAN Weekly dose was unchanged, will remain on 47.5mg  per week of warfarin.   Patient Instructions  Patient instructed to take medications as defined in the Anti-coagulation Track section of this encounter.  Patient instructed to take today's dose.  Patient instructed to take  1 & 1/2 tablets of your 5mg  peach-colored warfarin tablets by mouth, once-daily, at 6PM each day EXCEPT on Wednesdays and Thursdays, take only one (1) tablet on Wendesdays and Thursdays.  Patient verbalized understanding of these instructions.     Patient advised to contact clinic or seek medical attention if signs/symptoms of bleeding or thromboembolism occur.  Patient verbalized understanding by repeating back information and was advised  to contact me if further medication-related questions arise. Patient was also provided an information handout.  Follow-up Return in 2 weeks (on 10/01/2018) for Follow up INR.  Pennie Banter, PharmD, CPP  15 minutes spent face-to-face with the patient during the encounter. 50% of time spent on education, including signs/sx bleeding and clotting, as well as food and drug interactions with warfarin. 50% of time was spent on fingerprick POC INR sample collection,processing, results determination, and documentation in http://www.kim.net/.

## 2018-09-20 ENCOUNTER — Telehealth: Payer: Self-pay | Admitting: *Deleted

## 2018-09-20 NOTE — Progress Notes (Signed)
INTERNAL MEDICINE TEACHING ATTENDING ADDENDUM - Aldine Contes M.D  Duration- indefinite, Indication- DVT, Antiphospholipid syndrome, INR- therapeutic. Agree with pharmacy recommendations as outlined in their note.

## 2018-09-20 NOTE — Telephone Encounter (Signed)
Received fax from [facility] with INR results from 09/05/2018 and 09/17/2018. Last INR 3.3 on 09/17/2018. Results placed in Dr. Gladstone Pih office. Hubbard Hartshorn, RN, BSN

## 2018-09-20 NOTE — Telephone Encounter (Signed)
Patient performed that test in my presence (had been having difficulty with his point of care device). Aware of results of 17-Sep-2018. Note was placed into [facility].

## 2018-09-24 ENCOUNTER — Other Ambulatory Visit: Payer: Self-pay | Admitting: Pharmacist

## 2018-09-24 DIAGNOSIS — D6861 Antiphospholipid syndrome: Secondary | ICD-10-CM

## 2018-09-24 DIAGNOSIS — I63119 Cerebral infarction due to embolism of unspecified vertebral artery: Secondary | ICD-10-CM

## 2018-09-24 MED ORDER — WARFARIN SODIUM 5 MG PO TABS: ORAL_TABLET | ORAL | 1 refills | Status: DC

## 2018-09-24 NOTE — Telephone Encounter (Signed)
Patient texted me stating he has left his warfarin supply/bottle at his summer home in [place], [place] and is asking for a refill authorization to be sent to his [facility] in [place], [place]. Will do.

## 2018-10-01 ENCOUNTER — Ambulatory Visit: Payer: BC Managed Care – PPO

## 2018-10-02 DIAGNOSIS — M47812 Spondylosis without myelopathy or radiculopathy, cervical region: Secondary | ICD-10-CM | POA: Diagnosis not present

## 2018-10-16 ENCOUNTER — Other Ambulatory Visit: Payer: Self-pay | Admitting: Pharmacist

## 2018-10-16 DIAGNOSIS — I63119 Cerebral infarction due to embolism of unspecified vertebral artery: Secondary | ICD-10-CM

## 2018-10-16 DIAGNOSIS — D6861 Antiphospholipid syndrome: Secondary | ICD-10-CM

## 2018-10-22 DIAGNOSIS — M542 Cervicalgia: Secondary | ICD-10-CM | POA: Diagnosis not present

## 2018-10-22 DIAGNOSIS — M5030 Other cervical disc degeneration, unspecified cervical region: Secondary | ICD-10-CM | POA: Diagnosis not present

## 2018-10-24 DIAGNOSIS — Z7901 Long term (current) use of anticoagulants: Secondary | ICD-10-CM | POA: Diagnosis not present

## 2018-10-24 DIAGNOSIS — D6861 Antiphospholipid syndrome: Secondary | ICD-10-CM | POA: Diagnosis not present

## 2018-11-05 ENCOUNTER — Ambulatory Visit: Payer: BC Managed Care – PPO | Attending: Orthopedic Surgery | Admitting: Physical Therapy

## 2018-11-05 ENCOUNTER — Other Ambulatory Visit: Payer: Self-pay

## 2018-11-05 DIAGNOSIS — R293 Abnormal posture: Secondary | ICD-10-CM

## 2018-11-05 DIAGNOSIS — G44221 Chronic tension-type headache, intractable: Secondary | ICD-10-CM | POA: Insufficient documentation

## 2018-11-05 DIAGNOSIS — M542 Cervicalgia: Secondary | ICD-10-CM

## 2018-11-05 DIAGNOSIS — M5412 Radiculopathy, cervical region: Secondary | ICD-10-CM | POA: Diagnosis not present

## 2018-11-05 NOTE — Therapy (Signed)
[facility] [address]  [address] [place], [place], [address] Phone: [phone]   Fax:  [phone]  Physical Therapy Evaluation  Patient Details  Name: Brian Lloyd MRN: [id number] Date of Birth: Jul 01, 1960 Referring Provider (PT): [name], MD   Encounter Date: 11/05/2018  PT End of Session - 11/05/18 0852    Visit Number  1    Number of Visits  12    Date for PT Re-Evaluation  12/17/18    Authorization Type  BCBS - VL: 30    PT Start Time  0852    PT Stop Time  0933    PT Time Calculation (min)  41 min    Activity Tolerance  Patient tolerated treatment well    Behavior During Therapy  [facility] for tasks assessed/performed       Past Medical History:  Diagnosis Date  . Acute cerebral infarction (HCC) 11/18/2015   Bifrontal embolic infarcts MRI/MRA 11/18/15; known aortic valve vegetation on lovenox/coumadin/ASA  . Antiphospholipid antibody syndrome (HCC) 11/06/2015   11/04/15 significant elevation of IgG anticardiolipin & beta-2-GP-1 antibodies  . 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 Aortic valve mass   . 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 Arthritis    hands  . 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 DVT (deep venous thrombosis) (HCC)    left leg, on Xarelto  . 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 S/P aortic valve replacement with bioprosthetic valve 11/24/2015   25 mm [facility] Ease bovine pericardial tissue valve  . Thrombocytopenia (HCC)     Past Surgical History:  Procedure Laterality Date  . AORTIC VALVE REPLACEMENT N/A 11/24/2015   Procedure: AORTIC VALVE REPLACEMENT;  Surgeon: Purcell Nails, MD;  Location: [facility] OR;  Service: Open Heart Surgery;  Laterality: N/A;  . CARDIAC CATHETERIZATION N/A  10/29/2015   Procedure: Coronary/Graft Angiography;  Surgeon: Kathleene Hazel, MD;  Location: [facility] INVASIVE CV LAB;  Service: Cardiovascular;  Laterality: N/A;  . CORONARY ARTERY BYPASS GRAFT N/A 11/24/2015   Procedure: CORONARY ARTERY BYPASS GRAFTING (CABG)x 2 WITH ENDOSCOPIC HARVESTING OF RIGHT SAPHENOUS VEIN -SVG to LAD -SVG to OM1;  Surgeon: Purcell Nails, MD;  Location: [facility] OR;  Service: Open Heart Surgery;  Laterality: N/A;  . EXCISION OF ATRIAL MYXOMA N/A 11/24/2015   Procedure: EXCISION OF AORTIC VALVE MASS ;  Surgeon: Purcell Nails, MD;  Location: [facility] OR;  Service: Open Heart Surgery;  Laterality: N/A;  . HERNIA REPAIR Bilateral    inguinal  . MOUTH SURGERY     root canal  . TEE WITHOUT CARDIOVERSION N/A 10/22/2015   Procedure: TRANSESOPHAGEAL ECHOCARDIOGRAM (TEE);  Surgeon: Pricilla Riffle, MD;  Location: [facility] ENDOSCOPY;  Service: Cardiovascular;  Laterality: N/A;  . TEE WITHOUT CARDIOVERSION N/A 11/24/2015   Procedure: TRANSESOPHAGEAL ECHOCARDIOGRAM (TEE);  Surgeon: Purcell Nails, MD;  Location: [facility] OR;  Service: Open Heart Surgery;  Laterality: N/A;  . TRANSTHORACIC ECHOCARDIOGRAM  10/09/2015   Mild LVH. EF 60-65%. Pseudo-normal grade 2 diastolic dysfunction. 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 TEE recommended.  [facility] Kitchen UMBILICAL HERNIA REPAIR N/A 02/01/2018   Procedure: OPEN UMBILICAL HERNIA REPAIR ERAS PATHWAY;  Surgeon: Andria Meuse, MD;  Location: [facility] ORS;  Service:  General;  Laterality: N/A;    There were no vitals filed for this visit.   Subjective Assessment - 11/05/18 0856    Subjective  Pt reports daily neck pain which often leads to migraine headaches over past 5 years, progressively getting worse. Has had chiropractic care with minimal relief.    Pertinent History  cerebellar CVA affecting vision    Diagnostic tests  Per pt, cervical x-ray at MD office shows degenrative changes at C5-6    Patient Stated Goals  "pain  relief & ability to do more physical activities"    Currently in Pain?  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    Pain Relieving Factors  Excedrin, hydrocodone for sleep    Effect of Pain on Daily Activities  uncertain         [facility] PT Assessment - 11/05/18 0852      Assessment   Medical Diagnosis  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   No    Is the patient reluctant to leave their home because of a fear of falling?   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, hiking in [place]   Overall Cognitive Status  Within Functional Limits for tasks assessed      Observation/Other Assessments   Focus on Therapeutic Outcomes (FOTO)   Neck - 56% (44% limitation); Predicted 63% (37% limitation)      Posture/Postural Control   Posture/Postural Control  Postural limitations    Postural Limitations  Forward head;Rounded Shoulders      ROM / Strength   AROM / PROM / Strength  AROM;PROM;Strength      AROM    Overall AROM Comments  B shoulder ROM WNL    AROM Assessment Site  Cervical;Shoulder    Cervical Flexion  60    Cervical Extension  45    Cervical - Right Side Bend  26    Cervical - Left Side Bend  20    Cervical - Right Rotation  59    Cervical - Left Rotation  60      PROM   Overall  PROM Comments  Cervical PROM WFL/WNL but pain reported at end ranges    PROM Assessment Site  Cervical      Strength   Strength Assessment Site  Shoulder    Right/Left Shoulder  Right;Left    Right Shoulder Flexion  4+/5    Right Shoulder Extension  4/5    Right Shoulder Internal Rotation  4/5    Right Shoulder External Rotation  4/5    Left Shoulder Flexion  4+/5    Left Shoulder Extension  4/5    Left Shoulder Internal Rotation  4+/5    Left Shoulder External Rotation  4/5      Palpation   Palpation comment  increased muscle tension in B cervical paraspinals, UT & LS - pt denies ttp and reports relief from gentle manual STM & cervical distraction                Objective measurements completed on examination: See above findings.      [facility] Adult PT Treatment/Exercise - 11/05/18 0852      Self-Care   Self-Care  Posture    Posture  Provided initial education in neutral neck and shoulder posture to minimize neck muscle strain.      Exercises   Exercises  Neck      Neck Exercises: Seated   Neck Retraction  10 reps;5 secs    Other Seated Exercise  Scapular retraction 10x 5"      Neck Exercises: Stretches   Upper Trapezius Stretch  Right;Left;30 seconds;1 rep    Levator Stretch  Right;Left;30 seconds;1 rep    Corner Stretch  30 seconds;3 reps    Corner Stretch Limitations  3-way doorway stretch             PT Education - 11/05/18 0930    Education Details  PT eval findings, anticipated POC and initial postural education/HEP    Person(s) Educated  Patient    Methods  Explanation;Demonstration;Handout    Comprehension  Verbalized understanding;Returned  demonstration;Need further instruction       PT Short Term Goals - 11/05/18 0933      PT SHORT TERM GOAL #1   Title  Patient will be independent with initial HEP    Status  New    Target Date  11/19/18      PT SHORT TERM GOAL #2   Title  Patient will verbalize/demonstrate understanding of neutral spine posture and proper body mechanics to reduce strain on cervical spine    Status  New    Target Date  11/19/18        PT Long Term Goals - 11/05/18 0933      PT LONG TERM GOAL #1   Title  Patient will be independent with ongoing/advanced HEP    Status  New    Target Date  12/17/18      PT LONG TERM GOAL #2   Title  Patient to demonstrate appropriate posture and body mechanics needed for daily activities    Status  New    Target Date  12/17/18      PT LONG TERM GOAL #3   Title  Patient to improve cervical AROM to WNL/WFL without pain provocation    Status  New    Target Date  12/17/18      PT LONG TERM GOAL #4   Title  Patient to report reduction in frequency and intensity of migraines by >/= 50%    Status  New    Target  Date  12/17/18      PT LONG TERM GOAL #5   Title  Patient to report ability to perform ADLs, household and work-related tasks without increased pain    Status  New    Target Date  12/17/18             Plan - 11/05/18 0933    Clinical Impression Statement  Tahsin is a 58 y/o male who presents to OP PT for a 5 yr h/o progressively worsening chronic neck pain which leads to daily migraine headaches. No prior PT with only chiropractic care with minimal relief. Neck pain constant but variable in intensity up to 11/10 at worst and localized to B lower neck with radiation up into head causing migraines along with occasional numbness and tingling into either arm to hands. Forward head and rounded shoulder posture evident with R shoulder slightly elevated relative to L. Cervical AROM limited in all planes with PROM essentially WFL although pain reported at end  ROM. B shoulder AROM WNL however mild B shoulder weakness present R>L.  Increased muscle tension present in B cervical paraspinals, UT & LS (R>L), however patient denies ttp and reports relief from gentle manual STM & cervical distraction. Pain and limited cervical motion limit his tolerance for daily tasks, especially manual labor/physical activity using upper back and shoulders.  Aaro will benefit from skilled PT intervention to address above deficits and current functional limitations as well as to decrease pain interference with daily activities and reduce frequency and intensity of migraines.    Personal Factors and Comorbidities  Comorbidity 3+;Time since onset of injury/illness/exacerbation;Past/Current Experience;Profession    Comorbidities  OA; h/o cerebellar/ocualr CVA with residual visual deficits; AVR; CABG; DVT    Examination-Activity Limitations  Sleep;Lift    Examination-Participation Restrictions  Driving    Stability/Clinical Decision Making  Evolving/Moderate complexity    Clinical Decision Making  Moderate    Rehab Potential  Fair    PT Frequency  2x / week    PT Duration  6 weeks    PT Treatment/Interventions  ADLs/Self Care Home Management;Cryotherapy;Electrical Stimulation;Iontophoresis 4mg /ml Dexamethasone;Moist Heat;Traction;Ultrasound;Functional mobility training;Therapeutic activities;Therapeutic exercise;Balance training;Neuromuscular re-education;Patient/family education;Manual techniques;Passive range of motion;Dry needling;Taping;Spinal Manipulations    PT Next Visit Plan  Review initial HEP; postural & body mechanics education; postural stretching & strengtheing; manual therapy & modalities PRN including possible trial of traction    PT Home Exercise Plan  11/05/18 - Chin tuck, UT & LS stretches, 3-way doorway pec stretch, scap retraction    Consulted and Agree with Plan of Care  Patient       Patient will benefit from skilled therapeutic intervention in order to  improve the following deficits and impairments:  Pain, Decreased activity tolerance, Decreased coordination, Decreased range of motion, Decreased strength, Hypomobility, Increased muscle spasms, Impaired perceived functional ability, Impaired UE functional use, Improper body mechanics, Postural dysfunction  Visit Diagnosis: Cervicalgia  Radiculopathy, cervical region  Chronic tension-type headache, intractable  Abnormal posture     Problem List Patient Active Problem List   Diagnosis Date Noted  . S/P aortic valve replacement with bioprosthetic valve 11/24/2015  . Diplopia   . 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 Acute cerebral infarction (HCC) 11/18/2015  . 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 Antiphospholipid antibody syndrome (HCC) 11/06/2015  . Thrombocytopenia (HCC)   . 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 History of CVA (cerebrovascular accident) without residual deficits 10/19/2015  . Cerebellar infarct (HCC) 10/06/2015  .  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) 08/01/2015    Marry Guan, PT, MPT 11/05/2018, 7:19 PM  [facility] [address]  [address] [place], [place], [address] Phone: [phone]   Fax:  [phone]  Name: Dearies Meikle MRN: [id number] Date of Birth: Mar 29, 1960

## 2018-11-05 NOTE — Patient Instructions (Addendum)
    Home exercise program created by JoAnne Kreis, PT.  For questions, please contact JoAnne via phone at [phone] or email at [email or web address]  [facility] [place] [address]  [address] [place], [place], [address] Phone: [phone]   Fax:  [phone]    

## 2018-11-07 ENCOUNTER — Other Ambulatory Visit: Payer: Self-pay

## 2018-11-07 ENCOUNTER — Ambulatory Visit: Payer: BC Managed Care – PPO

## 2018-11-07 ENCOUNTER — Telehealth: Payer: Self-pay | Admitting: *Deleted

## 2018-11-07 DIAGNOSIS — M542 Cervicalgia: Secondary | ICD-10-CM | POA: Diagnosis not present

## 2018-11-07 DIAGNOSIS — R293 Abnormal posture: Secondary | ICD-10-CM | POA: Diagnosis not present

## 2018-11-07 DIAGNOSIS — M5412 Radiculopathy, cervical region: Secondary | ICD-10-CM

## 2018-11-07 DIAGNOSIS — G44221 Chronic tension-type headache, intractable: Secondary | ICD-10-CM

## 2018-11-07 NOTE — Therapy (Signed)
[facility] [address]  [address] [place], [place], [address] Phone: [phone]   Fax:  [phone]  Physical Therapy Treatment  Patient Details  Name: Brian BustleWayne Lloyd MRN: [id number] Date of Birth: [birth year] Referring Provider (PT): Venita Lickahari Brooks, MD   Encounter Date: 11/07/2018  PT End of Session - 11/07/18 0851    Visit Number  2    Number of Visits  12    Date for PT Re-Evaluation  12/17/18    Authorization Type  BCBS - VL: 30    PT Start Time  0845    PT Stop Time  0945    PT Time Calculation (min)  60 min    Activity Tolerance  Patient tolerated treatment well    Behavior During Therapy  [facility] for tasks assessed/performed       Past Medical History:  Diagnosis Date  . Acute cerebral infarction (HCC) 11/18/2015   Bifrontal embolic infarcts MRI/MRA 11/18/15; known aortic valve vegetation on lovenox/coumadin/ASA  . Antiphospholipid antibody syndrome (HCC) 11/06/2015   11/04/15 significant elevation of IgG anticardiolipin & beta-2-GP-1 antibodies  . 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 Aortic valve mass   . 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 Arthritis    hands  . 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 DVT (deep venous thrombosis) (HCC)    left leg, on Xarelto  . 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 S/P aortic valve replacement with bioprosthetic valve 11/24/2015   25 mm [facility] Ease bovine pericardial tissue valve  . Thrombocytopenia (HCC)     Past Surgical History:  Procedure Laterality Date  . AORTIC VALVE REPLACEMENT N/A 11/24/2015   Procedure: AORTIC VALVE REPLACEMENT;  Surgeon: Purcell Nailslarence H Owen, MD;  [facility];  Service: Open Heart Surgery;  Laterality: N/A;  . CARDIAC CATHETERIZATION N/A  10/29/2015   Procedure: Coronary/Graft Angiography;  Surgeon: Kathleene Hazelhristopher D McAlhany, MD;  [facility];  Service: Cardiovascular;  Laterality: N/A;  . CORONARY ARTERY BYPASS GRAFT N/A 11/24/2015   Procedure: CORONARY ARTERY BYPASS GRAFTING (CABG)x 2 WITH ENDOSCOPIC HARVESTING OF RIGHT SAPHENOUS VEIN -SVG to LAD -SVG to OM1;  Surgeon: Purcell Nailslarence H Owen, MD;  [facility];  Service: Open Heart Surgery;  Laterality: N/A;  . EXCISION OF ATRIAL MYXOMA N/A 11/24/2015   Procedure: EXCISION OF AORTIC VALVE MASS ;  Surgeon: Purcell Nailslarence H Owen, MD;  Location: [facility] OR;  Service: Open Heart Surgery;  Laterality: N/A;  . HERNIA REPAIR Bilateral    inguinal  . MOUTH SURGERY     root canal  . TEE WITHOUT CARDIOVERSION N/A 10/22/2015   Procedure: TRANSESOPHAGEAL ECHOCARDIOGRAM (TEE);  Surgeon: Pricilla RifflePaula V Ross, MD;  [facility];  Service: Cardiovascular;  Laterality: N/A;  . TEE WITHOUT CARDIOVERSION N/A 11/24/2015   Procedure: TRANSESOPHAGEAL ECHOCARDIOGRAM (TEE);  Surgeon: Purcell Nailslarence H Owen, MD;  [facility];  Service: Open Heart Surgery;  Laterality: N/A;  . TRANSTHORACIC ECHOCARDIOGRAM  10/09/2015   Mild LVH. EF 60-65%. Pseudo-normal grade 2 diastolic dysfunction. 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 TEE recommended.  Marland Kitchen. UMBILICAL HERNIA REPAIR N/A 02/01/2018   Procedure: OPEN UMBILICAL HERNIA REPAIR ERAS PATHWAY;  Surgeon: Andria MeuseWhite, Christopher M, MD;  Location: [facility] ORS;  Service:  General;  Laterality: N/A;    There were no vitals filed for this visit.  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    Pertinent History  cerebellar CVA affecting vision    Diagnostic tests  Per pt, cervical x-ray at MD office shows degenrative changes at C5-6    Patient Stated Goals  "pain relief & ability to do more physical activities"    Currently  in Pain?  Yes    Pain Score  5     Pain Location  Neck    Pain Orientation  Lower    Pain Descriptors / Indicators  Dull;Aching    Pain Type  Chronic pain    Pain Radiating Towards  up into head - had migraine; occasional numbness and tingling into either arm to hand    Pain Frequency  Constant    Multiple Pain Sites  No                       [facility] Adult PT Treatment/Exercise - 11/07/18 0001      Self-Care   Self-Care  Other Self-Care Comments    Other Self-Care Comments   Reviewed proper posture and body mechanics with daily activities including lifting, and sitting and standing posture to reduce cervical strain       Neck Exercises: Machines for Strengthening   UBE (Upper Arm Bike)  Lvl 1.0, 3 min forwards       Neck Exercises: Seated   Neck Retraction  5 secs;15 reps    Other Seated Exercise  Scapular retraction 10x 5"      Modalities   Modalities  Electrical Stimulation;Moist Heat      Moist Heat Therapy   Number Minutes Moist Heat  15 Minutes    Moist Heat Location  Cervical      Electrical Stimulation   Electrical Stimulation Location  B UT    Electrical Stimulation Action  IFC    Electrical Stimulation Parameters  80-150Hz , intensity to pt. tolerance, 15'    Electrical Stimulation Goals  Pain      Neck Exercises: Stretches   Upper Trapezius Stretch  Right;Left;30 seconds;1 rep    Levator Stretch  Right;Left;30 seconds;1 rep    Corner Stretch  30 seconds;3 reps    Corner Stretch Limitations  3-way doorway stretch               PT Short Term Goals - 11/07/18 0854      PT SHORT TERM GOAL #1   Title  Patient will be independent with initial HEP    Status  On-going    Target Date  11/19/18      PT SHORT TERM GOAL #2   Title  Patient will verbalize/demonstrate understanding of neutral spine posture and proper body mechanics to reduce strain on cervical spine    Status  On-going    Target Date  11/19/18        PT Long Term Goals -  11/07/18 0855      PT LONG TERM GOAL #1   Title  Patient will be independent with ongoing/advanced HEP    Status  On-going      PT LONG TERM GOAL #2   Title  Patient to demonstrate appropriate posture and body mechanics needed for daily activities    Status  On-going  PT LONG TERM GOAL #3   Title  Patient to improve cervical AROM to WNL/WFL without pain provocation    Status  On-going      PT LONG TERM GOAL #4   Title  Patient to report reduction in frequency and intensity of migraines by >/= 50%    Status  On-going      PT LONG TERM GOAL #5   Title  Patient to report ability to perform ADLs, household and work-related tasks without increased pain    Status  On-going            Plan - 11/07/18 0858    Clinical Impression Statement  Brian Lloyd reporting some improvement in pain levels since last visit.  Reports 2x/day HEP performance and demonstrating good technique overall with HEP review today.  Discussed proper posture and body mechanics with daily activities to reduce cervical strain with pt. reporting increased awareness of retracted posture recently.  Ended visit with moist heat/E-stim to upper shoulder/cervical spine to reduce pain and tone.    Personal Factors and Comorbidities  Comorbidity 3+;Time since onset of injury/illness/exacerbation;Past/Current Experience;Profession    Comorbidities  OA; h/o cerebellar/ocualr CVA with residual visual deficits; AVR; CABG; DVT    Rehab Potential  Fair    PT Treatment/Interventions  ADLs/Self Care Home Management;Cryotherapy;Electrical Stimulation;Iontophoresis 4mg /ml Dexamethasone;Moist Heat;Traction;Ultrasound;Functional mobility training;Therapeutic activities;Therapeutic exercise;Balance training;Neuromuscular re-education;Patient/family education;Manual techniques;Passive range of motion;Dry needling;Taping;Spinal Manipulations    PT Next Visit Plan  Postural stretching & strengtheing; manual therapy & modalities PRN including  possible trial of traction    PT Home Exercise Plan  11/05/18 - Chin tuck, UT & LS stretches, 3-way doorway pec stretch, scap retraction    Consulted and Agree with Plan of Care  Patient       Patient will benefit from skilled therapeutic intervention in order to improve the following deficits and impairments:  Pain, Decreased activity tolerance, Decreased coordination, Decreased range of motion, Decreased strength, Hypomobility, Increased muscle spasms, Impaired perceived functional ability, Impaired UE functional use, Improper body mechanics, Postural dysfunction  Visit Diagnosis: Cervicalgia  Radiculopathy, cervical region  Chronic tension-type headache, intractable  Abnormal posture     Problem List Patient Active Problem List   Diagnosis Date Noted  . S/P aortic valve replacement with bioprosthetic valve 11/24/2015  . Diplopia   . 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 Acute cerebral infarction (HCC) 11/18/2015  . 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 Antiphospholipid antibody syndrome (HCC) 11/06/2015  . Thrombocytopenia (HCC)   . 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 History of CVA (cerebrovascular accident) without residual deficits 10/19/2015  . Cerebellar infarct (HCC) 10/06/2015  . 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

## 2018-11-07 NOTE — Patient Instructions (Signed)

## 2018-11-07 NOTE — Telephone Encounter (Signed)
Received fax from Remote INR PT/INR self-testing report.   11/07/18  INR 3.7 I will send to Dr Elie Confer.

## 2018-11-09 ENCOUNTER — Telehealth: Payer: Self-pay | Admitting: Pharmacist

## 2018-11-09 ENCOUNTER — Telehealth: Payer: Self-pay | Admitting: Cardiology

## 2018-11-09 NOTE — Telephone Encounter (Signed)
Called the patient to report the results of the PST FS POC INR that was performed Tuesday/Wednesday of this week. INR = 3.7 on 47.5mg  warfarin/wk. Advised the patient to DECREASE to 45mg  warfarin/wk as:  1x5mg  MWF; 1 and 1/2 x 5mg  on all other days, repeat PST FS POC INR at agreed upon interval by device provider. The patient seemed very confused and stated he did not know what I was referring to. I asked him more clarifying questions. I asked to speak to his wife. She states (and I read note by Dr. Ellyn Hack, Cardiologist) that the patient has been having signs and symptoms similar to when he had a stroke in 2017. I asked her to perform the "FAST" assessment.She answered in the NEGATIVE to these questions. I indicated to her to have a VERY LOW THRESHOLD for taking him to the ED if she were to observe any worsening of his current symptoms as cited in Dr. Allison Quarry note, or, if he were to demonstrate any facial drooping, arm or extremity weakness/drift, any difficulty in speech and to go to the ED in a TIMELY manner. She verbalized her understanding of these instructions. She indicates her husband has history of CVA as a result of Liedman-Sacks Syndrome as a component of his APLABS. I provided my phone number but instructed her to not waste time contacting me--but to go to the ED post-haste if symptoms worsen or develop.

## 2018-11-09 NOTE — Telephone Encounter (Signed)
See separate note (had more than one note opened).

## 2018-11-09 NOTE — Telephone Encounter (Signed)
I have communicated with the patient the required dosage change. Will DECREASE from 47.5mg  warfarin/wk to 45mg  warfarin/wk. Repeat PST, FS, POC INR determination based upon agreed to interval with the INR device provider.

## 2018-11-09 NOTE — Telephone Encounter (Signed)
Called patient, he states that for the past few months he has had these symptoms of headaches, dizziness, feeling of passing out, neck pain, and arm tingling but they have worsened. He mentioned it to his PCP doctor, and they suggested to let us know and have him be seen. Patient does have history of strokes. I did ask if patient was monitoring BP at home- he states no. I told him it would be a good idea to start monitoring his BP when he does feel bad and take a log of BP and HR. Patient verbalized understanding. Also advised patient to drink plenty of fluids and when he feels dizzy to sit down, and stand slowly.   Patient appointment was moved up to 10/14 next week. Patient advised if symptoms worsen over the weekend to go to ER to be evaluated as it could also be stroke related, patient currently does not see a neurologist.

## 2018-11-09 NOTE — Telephone Encounter (Signed)
New Message  STAT if patient feels like he/she is going to faint   1) Are you dizzy now? No  2) Do you feel faint or have you passed out? Yes when he is dizzy  3) Do you have any other symptoms? Fainting, confusion (not being aware of what is going on), headaches, neck pain, tingling/numbness in arms, dizziness.  4) Have you checked your HR and BP (record if available)? N/A   Patient states that he is not having any issues right now, but is having spells when all symptoms are present. Scheduled patient with Jory Sims NP on 11/19/18 at 10:45 am.

## 2018-11-10 ENCOUNTER — Other Ambulatory Visit: Payer: Self-pay | Admitting: Pharmacist

## 2018-11-10 DIAGNOSIS — I63119 Cerebral infarction due to embolism of unspecified vertebral artery: Secondary | ICD-10-CM

## 2018-11-10 DIAGNOSIS — D6861 Antiphospholipid syndrome: Secondary | ICD-10-CM

## 2018-11-12 ENCOUNTER — Encounter: Payer: Self-pay | Admitting: Internal Medicine

## 2018-11-12 ENCOUNTER — Telehealth: Payer: Self-pay | Admitting: Internal Medicine

## 2018-11-12 ENCOUNTER — Ambulatory Visit (INDEPENDENT_AMBULATORY_CARE_PROVIDER_SITE_OTHER): Payer: BC Managed Care – PPO | Admitting: Internal Medicine

## 2018-11-12 ENCOUNTER — Ambulatory Visit (HOSPITAL_COMMUNITY)
Admission: RE | Admit: 2018-11-12 | Discharge: 2018-11-12 | Disposition: A | Discharge status: Home / Self Care | Payer: BC Managed Care – PPO | Source: Ambulatory Visit | Attending: Internal Medicine | Admitting: Internal Medicine

## 2018-11-12 ENCOUNTER — Other Ambulatory Visit: Payer: Self-pay

## 2018-11-12 ENCOUNTER — Telehealth: Payer: Self-pay | Admitting: Pharmacist

## 2018-11-12 VITALS — BP 113/85 | HR 85 | Wt 185.4 lb

## 2018-11-12 DIAGNOSIS — G9340 Encephalopathy, unspecified: Secondary | ICD-10-CM

## 2018-11-12 LAB — CBC WITH DIFFERENTIAL/PLATELET
Abs Immature Granulocytes: 0.04 10*3/uL (ref 0.00–0.07)
Basophils Absolute: 0 10*3/uL (ref 0.0–0.1)
Basophils Relative: 1 %
Eosinophils Absolute: 0.2 10*3/uL (ref 0.0–0.5)
Eosinophils Relative: 3 %
HCT: 45.1 % (ref 39.0–52.0)
Hemoglobin: 15.6 g/dL (ref 13.0–17.0)
Immature Granulocytes: 1 %
Lymphocytes Relative: 22 %
Lymphs Abs: 1.3 10*3/uL (ref 0.7–4.0)
MCH: 29.2 pg (ref 26.0–34.0)
MCHC: 34.6 g/dL (ref 30.0–36.0)
MCV: 84.5 fL (ref 80.0–100.0)
Monocytes Absolute: 0.4 10*3/uL (ref 0.1–1.0)
Monocytes Relative: 7 %
Neutro Abs: 4 10*3/uL (ref 1.7–7.7)
Neutrophils Relative %: 66 %
Platelets: 102 10*3/uL — ABNORMAL LOW (ref 150–400)
RBC: 5.34 MIL/uL (ref 4.22–5.81)
RDW: 12.4 % (ref 11.5–15.5)
WBC: 5.9 10*3/uL (ref 4.0–10.5)
nRBC: 0 % (ref 0.0–0.2)

## 2018-11-12 LAB — COMPREHENSIVE METABOLIC PANEL
ALT: 33 U/L (ref 0–44)
AST: 32 U/L (ref 15–41)
Albumin: 4.4 g/dL (ref 3.5–5.0)
Alkaline Phosphatase: 51 U/L (ref 38–126)
Anion gap: 11 (ref 5–15)
BUN: 20 mg/dL (ref 6–20)
CO2: 25 mmol/L (ref 22–32)
Calcium: 9.4 mg/dL (ref 8.9–10.3)
Chloride: 102 mmol/L (ref 98–111)
Creatinine, Ser: 1.28 mg/dL — ABNORMAL HIGH (ref 0.61–1.24)
GFR calc Af Amer: >60 mL/min (ref >60)
GFR calc non Af Amer: >60 mL/min (ref >60)
Glucose, Bld: 106 mg/dL — ABNORMAL HIGH (ref 70–99)
Potassium: 5.1 mmol/L (ref 3.5–5.1)
Sodium: 138 mmol/L (ref 135–145)
Total Bilirubin: 1.1 mg/dL (ref 0.3–1.2)
Total Protein: 7.6 g/dL (ref 6.5–8.1)

## 2018-11-12 LAB — TSH: TSH: 2.983 u[IU]/mL (ref 0.350–4.500)

## 2018-11-12 NOTE — Telephone Encounter (Signed)
Called the patient at all listed numbers. Called the patient's emergency contact (wife) at all listed numbers. Left VM on home phone and at wife's place of employment (listed).

## 2018-11-12 NOTE — Addendum Note (Signed)
Addended by: Truddie Crumble on: 11/12/2018 11:51 AM   Modules accepted: Orders

## 2018-11-12 NOTE — Progress Notes (Signed)
Internal Medicine Clinic Attending  Case discussed with Dr. Helberg at the time of the visit.  We reviewed the resident's history and exam and pertinent patient test results.  I agree with the assessment, diagnosis, and plan of care documented in the resident's note.  Alexander Raines, M.D., Ph.D.  

## 2018-11-12 NOTE — Assessment & Plan Note (Signed)
HPI:  patient presents to be evaluated for altered mental status. He states that on Friday he received a call from Dr. Elie Confer and was unable to understand what he was talking about. This prompted him to seek further medical care.  Approximately three months ago the patient noticed daily headaches and intermittent confusion. He states that he was able to recognize his confusion and it seemed that he was unable to piece together conversations. This would prompt him to have people talk to his wife instead of him. Initially the episodes were short-lived and occurred 1 to 2 times per week. Over the course of the next three months he noticed longer durations and increased frequency in a now occur every other day. He is seen a neurosurgeon approximately one month ago for his daily headaches who got cervical x-rays and started him on hydrocodone. He has had no other changes in his medications and he is only on warfarin. He has had a super therapeutic INR on the last three checks however has had no false or head trauma. He denies any new over-the-counter medications or supplements. He drinks alcohol 1 to 2 times per week. He does not use tobacco or other illicit substances. He works as a Licensed conveyancer. He has no hobbies that would expose him to noxious chemicals.   Review of systems is negative for visual changes, dry, dry mouth, cough/choking, shortness of breath, chest pain, syncope/pre-syncope, abdominal pain, diarrhea, constipation, changes in urinary habits, myalgias, arthralgias, skin rashes.  He denies perseveration/rumination of his symptoms. He states that they do not limit his daily activities. Does not have any anxiety or depression.  Neuro: Alert and oriented x 3, cranial nerves 2-12 intact, gross strength 5/5 in all extremities, intact sensation to light touch and temperature in all extremities and trunk, gait with normal stride and rhythm, reflexes 2+ in patellar and achilles, no clonus   A/P: -  Check for metabolic causes with TSH, CMP, and CBC  - No reason to suspect infectious causes.  - STAT CT head without contrast to rule out structural causes.  - Hold hydrocodone to see if AMS improves  - If work-up is unrevealing may need to see neurology as partial seizures is on the differential

## 2018-11-12 NOTE — Telephone Encounter (Signed)
I agree. Thank you.

## 2018-11-12 NOTE — Telephone Encounter (Signed)
Attempted to call patient to discuss lab results and CT head. No answer. His labs and CT were unremarkable for the cause of his confusion. We will place a referral to neurology for further evaluation.   Ina Homes, MD  [facility] PGY3

## 2018-11-12 NOTE — Progress Notes (Signed)
   CC: AMS  HPI:  [name] is a 58 y.o. male with PMHx listed below presenting for AMS. Please see the A&P for the status of the patient's chronic medical problems.  Past Medical History:  Diagnosis Date  . Acute cerebral infarction ([place]) 11/18/2015   Bifrontal embolic infarcts MRI/MRA 11/18/15; known aortic valve vegetation on lovenox/coumadin/ASA  . Antiphospholipid antibody syndrome ([place]) 11/06/2015   11/04/15 significant elevation of IgG anticardiolipin & beta-2-GP-1 antibodies  . 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 Aortic valve mass   . 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 Arthritis    hands  . Cerebellar infarct ([place]) 10/06/2015   Cardiac MRI showed acute to subacute lacunar infarct in the left cerebellum  . DVT (deep venous thrombosis) (HCC)    left leg, on Xarelto  . DVT, lower extremity, distal, chronic ([place]) 08/2015   Left calf DVT following injury  . Libman-Sacks endocarditis ([place])   . S/P aortic valve replacement with bioprosthetic valve 11/24/2015   25 mm [facility] Ease bovine pericardial tissue valve  . Thrombocytopenia ([place])    Review of Systems:  Performed and all others negative.  Physical Exam: Vitals:   11/12/18 1048  BP: 113/85  Pulse: 85  SpO2: 99%  Weight: 185 lb 6.4 oz (84.1 kg)   General: Well nourished male in no acute distress Pulm: Good air movement with no wheezing or crackles  CV: RRR, no murmurs, no rubs  Skin: Warm and dry, no rashes  Neuro: Alert and oriented x 3, cranial nerves 2-12 intact, gross strength 5/5 in all extremities, intact sensation to light touch and temperature in all extremities and trunk, gait with normal stride and rhythm, reflexes 2+ in patellar and achilles, no clonus   Assessment & Plan:   See Encounters Tab for problem based charting.  Patient discussed with Dr. Rebeca Alert

## 2018-11-12 NOTE — Telephone Encounter (Signed)
Thank you Dr. Elie Confer. I will forward to our front desk and triage pool as well. Please schedule this patient for appointment in [facility] as soon as possible for worsening confusion. If his symptoms are worse when you reach him please ask them to take him to the ED

## 2018-11-12 NOTE — Patient Instructions (Addendum)
Thank you for allowing [place] to provide your care. Today we are checking some lab work and getting a CT of your head to determine the etiology of her altered mental status. If these are negative we will need to get you over to see a neurologist to rule out partial seizures. In the meantime please hold your hydrocodone to see if her symptoms improve.  Please schedule follow-up in three months or sooner if any issues arise.

## 2018-11-13 ENCOUNTER — Encounter: Payer: Self-pay | Admitting: Physical Therapy

## 2018-11-13 ENCOUNTER — Telehealth: Payer: Self-pay | Admitting: Internal Medicine

## 2018-11-13 ENCOUNTER — Ambulatory Visit: Payer: BC Managed Care – PPO | Admitting: Physical Therapy

## 2018-11-13 DIAGNOSIS — M542 Cervicalgia: Secondary | ICD-10-CM

## 2018-11-13 DIAGNOSIS — M5412 Radiculopathy, cervical region: Secondary | ICD-10-CM | POA: Diagnosis not present

## 2018-11-13 DIAGNOSIS — G44221 Chronic tension-type headache, intractable: Secondary | ICD-10-CM | POA: Diagnosis not present

## 2018-11-13 DIAGNOSIS — R293 Abnormal posture: Secondary | ICD-10-CM

## 2018-11-13 NOTE — Telephone Encounter (Signed)
Patient is returning your phone call.  Please call him back at [phone].

## 2018-11-13 NOTE — Telephone Encounter (Signed)
Called to discuss results with the patient. He is aware of the neurology consult.

## 2018-11-13 NOTE — Progress Notes (Signed)
Cardiology Office Note   Date:  11/14/2018   ID:  Brian Lloyd, DOB 27-Jan-1961, MRN [id number]  PCP:  Marty Heck, DO  Cardiologist: Dr. Ellyn Hack No chief complaint on file.    History of Present Illness: Brian Lloyd is a 58 y.o. 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  Intraoperative findings included hemorrhage vegetation densely adherent to the ventricular surface of the aortic valve.  This eventually required valve replacement as above.  Other history includes CVA, with punctate left cerebral infarct, subacute cortical infarct in the anterior right frontal lobe, acute punctate cortical nonhemorrhagic infarct in the anterior left frontal lobe.  Antiphospholipid antibody syndrome on Coumadin, per neurology the patient will require lifelong Coumadin and aspirin.  Also a history of DVT, Libman-Sacks endocarditis, and thrombocytopenia.  The patient was last seen in the office by Almyra Deforest, PA, for preoperative evaluation for inguinal hernia repair on 12/14/2017.  The patient's Coumadin was monitored by primary care physician and he was recommended for Coumadin bridging given history of antiphospholipid antibody syndrome, and is also recommended for SBE prophylaxis.  A repeat echocardiogram was ordered for reevaluation of aortic valve and LV function.  Repeat echocardiogram on 12/21/2017, revealed LVEF of 60% to 81%, grade 2 diastolic dysfunction, aortic valve bioprosthesis valve was functioning normally.  He comes today without any cardiac complaints.  He is able to exercise, work in his yard, use yard machines etc. without any chest pain, shortness of breath, exertional fatigue.  He continues to have some neurologic issues which manifest in memory loss.  Most specifically when doing  cognitive organization to follow a pattern or protocol while working on his computer.  He finds it difficult to remember each step.  He is to be referred to neurology by his primary care physician for ongoing evaluation with history of CVA.   He is also having some musculoskeletal issues with neck and shoulder pain with some numbness and tingling noted in the distal extremities wrists and hands.  He is undergoing physical therapy which is helpful.  He is also to see an orthopedic physician to assess cervical spine and this is underway now.  Past Medical History:  Diagnosis Date  . Acute cerebral infarction ([place]) 11/18/2015   Bifrontal embolic infarcts MRI/MRA 11/18/15; known aortic valve vegetation on lovenox/coumadin/ASA  . Antiphospholipid antibody syndrome ([place]) 11/06/2015   11/04/15 significant elevation of IgG anticardiolipin & beta-2-GP-1 antibodies  . 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 Aortic valve mass   . 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 Arthritis    hands  . Cerebellar infarct ([place]) 10/06/2015   Cardiac MRI showed acute to subacute lacunar infarct in the left cerebellum  . DVT (deep venous thrombosis) (H[facility])    left leg, on Xarelto  . DVT, lower extremity, distal, chronic ([place]) 08/2015   Left calf DVT following injury  . Libman-Sacks endocarditis ([place])   . S/P aortic valve replacement with bioprosthetic valve 11/24/2015   25 mm [facility] Ease bovine pericardial tissue valve  . Thrombocytopenia ([place])     Past Surgical History:  Procedure Laterality Date  . AORTIC VALVE REPLACEMENT N/A 11/24/2015   Procedure: AORTIC VALVE REPLACEMENT;  Surgeon: Rexene Alberts, MD;  Location: [facility];  Service: Open Heart Surgery;  Laterality:  N/A;  . CARDIAC CATHETERIZATION N/A 10/29/2015   Procedure: Coronary/Graft Angiography;  Surgeon: Kathleene Hazel, MD;  Location: [facility] INVASIVE CV LAB;  Service: Cardiovascular;  Laterality: N/A;  . CORONARY ARTERY  BYPASS GRAFT N/A 11/24/2015   Procedure: CORONARY ARTERY BYPASS GRAFTING (CABG)x 2 WITH ENDOSCOPIC HARVESTING OF RIGHT SAPHENOUS VEIN -SVG to LAD -SVG to OM1;  Surgeon: Purcell Nails, MD;  Location: [facility] OR;  Service: Open Heart Surgery;  Laterality: N/A;  . EXCISION OF ATRIAL MYXOMA N/A 11/24/2015   Procedure: EXCISION OF AORTIC VALVE MASS ;  Surgeon: Purcell Nails, MD;  Location: [facility] OR;  Service: Open Heart Surgery;  Laterality: N/A;  . HERNIA REPAIR Bilateral    inguinal  . MOUTH SURGERY     root canal  . TEE WITHOUT CARDIOVERSION N/A 10/22/2015   Procedure: TRANSESOPHAGEAL ECHOCARDIOGRAM (TEE);  Surgeon: Pricilla Riffle, MD;  Location[facility] ENDOSCOPY;  Service: Cardiovascular;  Laterality: N/A;  . TEE WITHOUT CARDIOVERSION N/A 11/24/2015   Procedure: TRANSESOPHAGEAL ECHOCARDIOGRAM (TEE);  Surgeon: Purcell Nails, MD;  Location: [facility] OR;  Service: Open Heart Surgery;  Laterality: N/A;  . TRANSTHORACIC ECHOCARDIOGRAM  10/09/2015   Mild LVH. EF 60-65%. Pseudo-normal grade 2 diastolic dysfunction. 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 TEE recommended.  Marland Kitchen UMBILICAL HERNIA REPAIR N/A 02/01/2018   Procedure: OPEN UMBILICAL HERNIA REPAIR ERAS PATHWAY;  Surgeon: Andria Meuse, MD;  Location: [facility] ORS;  Service: General;  Laterality: N/A;     Current Outpatient Medications  Medication Sig Dispense Refill  . acetaminophen (TYLENOL) 500 MG tablet Take 1,000 mg by mouth every 6 (six) hours as needed for moderate pain or headache.     Marland Kitchen aspirin EC 81 MG tablet Take 81 mg by mouth daily.    . Multiple Vitamins-Minerals (MULTIVITAMIN PO) Take 1 tablet by mouth daily.    . psyllium (METAMUCIL) 58.6 % packet Take 1 packet by mouth daily.    Marland Kitchen warfarin (COUMADIN) 5 MG tablet Take 1 and 1/2 tablet by mouth daily, EXCEPT on Mondays, Wednesdays and Fridays--take only one (1) tablet. 36 tablet 1   No current facility-administered medications for this visit.      Allergies:   Naproxen    Social History:  The patient  reports that he has never smoked. He has never used smokeless tobacco. He reports previous alcohol use of about 3.0 standard drinks of alcohol per week. He reports that he does not use drugs.   Family History:  The patient's family history includes Diabetes in his paternal grandfather; Heart disease in his father, maternal grandfather, and paternal grandfather; Hypertension in his father and maternal grandfather.    ROS: All other systems are reviewed and negative. Unless otherwise mentioned in H&P    PHYSICAL EXAM: VS:  BP 132/86   Pulse 73   Temp (!) 97.2 F (36.2 C)   Ht 5\' 9"  (1.753 m)   Wt 185 lb (83.9 kg)   SpO2 98%   BMI 27.32 kg/m  , BMI Body mass index is 27.32 kg/m. GEN: Well nourished, well developed, in no acute distress HEENT: normal Neck: no JVD, carotid bruits, or masses Cardiac: RRR; no murmurs, rubs, or gallops,no edema  Respiratory:  Clear to auscultation bilaterally, normal work of breathing GI: soft, nontender, nondistended, + BS MS: no deformity or atrophy Skin: warm and dry, no rash Neuro:  Strength and sensation are intact Psych: euthymic mood, full affect   EKG: Normal  sinus rhythm, heart rate of 76 bpm.  Recent Labs: 11/12/2018: ALT 33; BUN 20; Creatinine, Ser 1.28; Hemoglobin 15.6; Platelets 102; Potassium 5.1; Sodium 138; TSH 2.983    Lipid Panel No results found for: CHOL, TRIG, HDL, CHOLHDL, VLDL, LDLCALC, LDLDIRECT    Wt Readings from Last 3 Encounters:  11/14/18 185 lb (83.9 kg)  11/12/18 185 lb 6.4 oz (84.1 kg)  03/19/18 190 lb 14.4 oz (86.6 kg)      Other studies Reviewed: Echocardiogram 12/21/2017 Left ventricle: The cavity size was normal. Wall thickness was   increased in a pattern of moderate LVH. Systolic function was   normal. The estimated ejection fraction was in the range of 60%   to 65%. Wall motion was normal; there were no regional wall   motion abnormalities.  Features are consistent with a pseudonormal   left ventricular filling pattern, with concomitant abnormal   relaxation and increased filling pressure (grade 2 diastolic   dysfunction). - Aortic valve: A bioprosthesis was present. Valve area (VTI): 2.42   cm^2. Valve area (Vmax): 2.13 cm^2. Valve area (Vmean): 2.21   cm^2. - Right ventricle: The cavity size was mildly dilated. - Right atrium: The atrium was mildly dilated. - Pulmonary arteries: Systolic pressure was mildly increased. PA   peak pressure: 35 mm Hg (S).   ASSESSMENT AND PLAN:  1.  Coronary artery disease: History of coronary artery bypass grafting in 2017 with SVG to distal LAD and SVG to OM.  He offers no cardiac complaints at this time.  No planned ischemic testing.   2.  Aortic valve disease: Status post Edwards magna ease pericardial tissue valve after aortic valve mass was noted during intraoperative procedure.    3.  History of CVA: Punctate left cerebral infarct, subacute cortical infarct in the anterior right frontal lobe, acute punctate cortical nonhemorrhagic infarct in the anterior frontal lobe.  He is being referred to neurology by his PCP for ongoing management and evaluation.    He is having some cognitive difficulties concerning remembering protocols and specific algorithms that need to be accomplished while he is working on his computer.  He has no problem with completing tasks, but his findings have organizational skills are becoming more of an issue for him.  4.  Antiphospholipid antibody syndrome: Continues on Coumadin therapy.  Current medicines are reviewed at length with the patient today.  He will follow-up with Dr. Herbie BaltimoreHarding in 1 year unless symptomatic.  Labs/ tests ordered today include: None Bettey MareKathryn M. Liborio NixonLawrence DNP, ANP, AACC   11/14/2018 8:51 AM    [facility] [address] Office [phone] Fax [phone]  Notice: This dictation was prepared with  Dragon dictation along with smaller phrase technology. Any transcriptional errors that result from this process are unintentional and may not be corrected upon review.

## 2018-11-13 NOTE — Telephone Encounter (Signed)
Attempted to call patient to discuss lab results and CT scan. Voicemail left to return call to clinic. If patient call back he can be told "His labs and CT were unremarkable for the cause of his confusion. We will place a referral to neurology for further evaluation."  Brian Homes, MD  IM[facility] PGY3

## 2018-11-13 NOTE — Therapy (Addendum)
[facility] [address]  [place], [place], [address] Phone: [phone]   Fax:  [phone]  Physical Therapy Treatment  Patient Details  Name: Brian Lloyd MRN: [id number] Date of Birth: 07-22-1960 Referring Provider (PT): [name], MD   Encounter Date: 11/13/2018  PT End of Session - 11/13/18 0820    Visit Number  3    Number of Visits  12    Date for PT Re-Evaluation  12/17/18    Authorization Type  BCBS - VL: 63    PT Start Time  0820   Pt arrived early   PT Stop Time  0926    PT Time Calculation (min)  66 min    Activity Tolerance  Patient tolerated treatment well    Behavior During Therapy  [facility] for tasks assessed/performed       Past Medical History:  Diagnosis Date  . Acute cerebral infarction ([place]) 11/18/2015   Bifrontal embolic infarcts MRI/MRA 11/18/15; known aortic valve vegetation on lovenox/coumadin/ASA  . Antiphospholipid antibody syndrome ([place]) 11/06/2015   11/04/15 significant elevation of IgG anticardiolipin & beta-2-GP-1 antibodies  . 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 Aortic valve mass   . 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 Arthritis    hands  . Cerebellar infarct ([place]) 10/06/2015   Cardiac MRI showed acute to subacute lacunar infarct in the left cerebellum  . DVT (deep venous thrombosis) (HCC)    left leg, on Xarelto  . DVT, lower extremity, distal, chronic ([place]) 08/2015   Left calf DVT following injury  . 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 S/P aortic valve replacement with bioprosthetic valve 11/24/2015   25 mm [facility] Ease bovine pericardial tissue valve  . Thrombocytopenia ([place])     Past Surgical History:  Procedure Laterality Date  . AORTIC VALVE REPLACEMENT N/A 11/24/2015   Procedure: AORTIC VALVE REPLACEMENT;  Surgeon: Rexene Alberts, MD;  Location: [facility];  Service: Open Heart Surgery;  Laterality: N/A;  . CARDIAC  CATHETERIZATION N/A 10/29/2015   Procedure: Coronary/Graft Angiography;  Surgeon: Burnell Blanks, MD;  Location: [facility] CV LAB;  Service: Cardiovascular;  Laterality: N/A;  . CORONARY ARTERY BYPASS GRAFT N/A 11/24/2015   Procedure: CORONARY ARTERY BYPASS GRAFTING (CABG)x 2 WITH ENDOSCOPIC HARVESTING OF RIGHT SAPHENOUS VEIN -SVG to LAD -SVG to OM1;  Surgeon: Rexene Alberts, MD;  Location: [facility];  Service: Open Heart Surgery;  Laterality: N/A;  . EXCISION OF ATRIAL MYXOMA N/A 11/24/2015   Procedure: EXCISION OF AORTIC VALVE MASS ;  Surgeon: Rexene Alberts, MD;  Location: [facility];  Service: Open Heart Surgery;  Laterality: N/A;  . HERNIA REPAIR Bilateral    inguinal  . MOUTH SURGERY     root canal  . TEE WITHOUT CARDIOVERSION N/A 10/22/2015   Procedure: TRANSESOPHAGEAL ECHOCARDIOGRAM (TEE);  Surgeon: Fay Records, MD;  Location: [facility] ENDOSCOPY;  Service: Cardiovascular;  Laterality: N/A;  . TEE WITHOUT CARDIOVERSION N/A 11/24/2015   Procedure: TRANSESOPHAGEAL ECHOCARDIOGRAM (TEE);  Surgeon: Rexene Alberts, MD;  Location: [facility];  Service: Open Heart Surgery;  Laterality: N/A;  . TRANSTHORACIC ECHOCARDIOGRAM  10/09/2015   Mild LVH. EF 60-65%. Pseudo-normal grade 2 diastolic dysfunction. 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 TEE recommended.  Marland Kitchen UMBILICAL HERNIA REPAIR N/A 02/01/2018   Procedure: OPEN UMBILICAL HERNIA REPAIR ERAS PATHWAY;  Surgeon: Ileana Roup, MD;  Location:  [facility]S;  Service: General;  Laterality: N/A;    There were no vitals filed for this visit.  Subjective Assessment - 11/13/18 0823    Subjective  Pt reporting improving awareness of posture after postural education last visit. Feels this and HEP has been helping. Still getting daily headaches, but not as severe.    Pertinent History  cerebellar CVA affecting vision    Diagnostic tests  Per pt, cervical x-ray at MD office shows degenrative changes at C5-6; 11/12/18 -  Head CT: No acute intracranial pathology.    Patient Stated Goals  "pain relief & ability to do more physical activities"    Currently in Pain?  Yes    Pain Score  3     Pain Location  Neck    Pain Orientation  Lower    Pain Descriptors / Indicators  Dull;Aching    Pain Type  Chronic pain    Pain Frequency  Intermittent                       [facility] Adult PT Treatment/Exercise - 11/13/18 0820      Exercises   Exercises  Neck      Neck Exercises: Machines for Strengthening   UBE (Upper Arm Bike)  L1.5 x 6 min (3' fwd/3' back)      Neck Exercises: Theraband   Shoulder External Rotation  10 reps;Red    Shoulder External Rotation Limitations  + scap retraction; hooklying over pool noodle    Horizontal ABduction  10 reps;Red    Horizontal ABduction Limitations  + scap retraction; hooklying over pool noodle    Other Theraband Exercises  Alt UE diagonals + scap retraction with red TB 10 x 5 sec; hooklying over pool noodle      Neck Exercises: Seated   Cervical Rotation  Right;Left;10 reps   AAROM   Cervical Rotation Limitations  cervical SNAG      Modalities   Modalities  Traction      Traction   Type of Traction  Cervical   20 dg pull   Min (lbs)  12    Max (lbs)  18-20   increased after ~5 minutes   Hold Time  60    Rest Time  20    Time  10      Manual Therapy   Manual Therapy  Soft tissue mobilization;Manual Traction    Manual therapy comments  hooklying    Soft tissue mobilization  STM to B pecs, UT, scalenes & SCM    Manual Traction  gentle manual distraction 5 x 30 sec - pt noting good relief      Neck Exercises: Stretches   Chest Stretch  60 seconds    Chest Stretch Limitations  snow angel stretch - hooklying over pool noodle    Other Neck Stretches  SCM stretch x 30 sec             PT Education - 11/13/18 0910    Education Details  HEP update - SCM stretch, cervical rotation SNAGs, postural strengthening focusing on scap retraciton; Role  of traction    Person(s) Educated  Patient    Methods  Explanation;Demonstration;Handout    Comprehension  Verbalized understanding;Returned demonstration;Need further instruction       PT Short Term Goals - 11/13/18 0926      PT SHORT TERM GOAL #1   Title  Patient will be independent with initial HEP    Status  Achieved  11/13/18     PT SHORT TERM GOAL #2   Title  Patient will verbalize/demonstrate understanding of neutral spine posture and proper body mechanics to reduce strain on cervical spine    Status  Achieved   11/13/18       PT Long Term Goals - 11/07/18 0855      PT LONG TERM GOAL #1   Title  Patient will be independent with ongoing/advanced HEP    Status  On-going      PT LONG TERM GOAL #2   Title  Patient to demonstrate appropriate posture and body mechanics needed for daily activities    Status  On-going      PT LONG TERM GOAL #3   Title  Patient to improve cervical AROM to WNL/WFL without pain provocation    Status  On-going      PT LONG TERM GOAL #4   Title  Patient to report reduction in frequency and intensity of migraines by >/= 50%    Status  On-going      PT LONG TERM GOAL #5   Title  Patient to report ability to perform ADLs, household and work-related tasks without increased pain    Status  On-going            Plan - 11/13/18 [place] noting benefit from PT with decreasing frequency and intensity of pain along with decreasing severity of headaches and improving postural awareness. He denies any concerns with initial HEP or questions with posture and body mechanics education - STGs met. Good tolerance for progression to postural strengthening in hooklying. Increased muscle tension still present in pecs (ttp on R) and UT, R>L, as well as L SCM with continued limited PROM most pronounced in L rotation - provided instruction in stretches and AAROM with cervical SNAGs. Patient noting benefit from manual STM and  traction, even noting improvement with vision during manual cervical distraction, therefore concluded session with trial of mechanical traction.    Personal Factors and Comorbidities  --    Comorbidities  OA; h/o cerebellar/ocualr CVA with residual visual deficits; AVR; CABG; DVT    Rehab Potential  Fair    PT Frequency  2x / week    PT Duration  6 weeks    PT Treatment/Interventions  ADLs/Self Care Home Management;Cryotherapy;Electrical Stimulation;Iontophoresis 45m/ml Dexamethasone;Moist Heat;Traction;Ultrasound;Functional mobility training;Therapeutic activities;Therapeutic exercise;Balance training;Neuromuscular re-education;Patient/family education;Manual techniques;Passive range of motion;Dry needling;Taping;Spinal Manipulations    PT Next Visit Plan  Assess response to mechanical traction; postural stretching & strengthening; manual therapy & modalities PRN including traction as benefit noted    PT Home Exercise Plan  11/05/18 - Chin tuck, UT & LS stretches, 3-way doorway pec stretch, scap retraction; 11/13/18 - SCM stretch, cervical rotation SNAGs, hooklying scap retraction + horiz ABD & ER with red TB    Consulted and Agree with Plan of Care  Patient       Patient will benefit from skilled therapeutic intervention in order to improve the following deficits and impairments:  Pain, Decreased activity tolerance, Decreased coordination, Decreased range of motion, Decreased strength, Hypomobility, Increased muscle spasms, Impaired perceived functional ability, Impaired UE functional use, Improper body mechanics, Postural dysfunction  Visit Diagnosis: Cervicalgia  Radiculopathy, cervical region  Chronic tension-type headache, intractable  Abnormal posture     Problem List Patient Active Problem List   Diagnosis Date Noted  . Encephalopathy 11/12/2018  . S/P aortic valve replacement with bioprosthetic valve 11/24/2015  . Diplopia   .  Cerebrovascular accident (CVA) due to embolism of  vertebral artery ([employer]) 11/19/2015  . Acute cerebral infarction ([place]) 11/18/2015  . 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 Antiphospholipid antibody syndrome ([place]) 11/06/2015  . Thrombocytopenia ([place])   . 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 History of CVA (cerebrovascular accident) without residual deficits 10/19/2015  . Cerebellar infarct ([place]) 10/06/2015  . 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 ([place]) 08/01/2015    Percival Spanish, PT, MPT 11/13/2018, 8:02 PM  [facility] [address]  [address] [place], [place], [address] Phone: [phone]   Fax:  [phone]  Name: Brian Lloyd MRN: [id number] Date of Birth: 08-Jun-1960

## 2018-11-14 ENCOUNTER — Other Ambulatory Visit: Payer: Self-pay

## 2018-11-14 ENCOUNTER — Encounter: Payer: Self-pay | Admitting: Adult Health

## 2018-11-14 ENCOUNTER — Ambulatory Visit (INDEPENDENT_AMBULATORY_CARE_PROVIDER_SITE_OTHER): Payer: BC Managed Care – PPO | Admitting: Adult Health

## 2018-11-14 VITALS — BP 132/86 | HR 73 | Temp 97.2°F | Ht 69.0 in | Wt 185.0 lb

## 2018-11-14 DIAGNOSIS — Z8673 Personal history of transient ischemic attack (TIA), and cerebral infarction without residual deficits: Secondary | ICD-10-CM | POA: Diagnosis not present

## 2018-11-14 DIAGNOSIS — I358 Other nonrheumatic aortic valve disorders: Secondary | ICD-10-CM | POA: Diagnosis not present

## 2018-11-14 DIAGNOSIS — D6861 Antiphospholipid syndrome: Secondary | ICD-10-CM | POA: Diagnosis not present

## 2018-11-14 DIAGNOSIS — I2510 Atherosclerotic heart disease of native coronary artery without angina pectoris: Secondary | ICD-10-CM

## 2018-11-14 DIAGNOSIS — Z952 Presence of prosthetic heart valve: Secondary | ICD-10-CM

## 2018-11-14 NOTE — Patient Instructions (Signed)
Medication Instructions:  Continue current medications  If you need a refill on your cardiac medications before your next appointment, please call your pharmacy.  Labwork: None Ordered   Testing/Procedures: None Ordered  Follow-Up: You will need a follow up appointment in 1 Year.  Please call our office 2 months in advance to schedule this appointment.  You may see Dr Harding or one of the following Advanced Practice Providers on your designated Care Team:   Rhonda Barrett, PA-C . Kathryn Lawrence, DNP, ANP     At [facility], you and your health needs are our priority.  As part of our continuing mission to provide you with exceptional heart care, we have created designated Provider Care Teams.  These Care Teams include your primary Cardiologist (physician) and Advanced Practice Providers (APPs -  Physician Assistants and Nurse Practitioners) who all work together to provide you with the care you need, when you need it.  Thank you for choosing [facility] [address]!!     

## 2018-11-15 ENCOUNTER — Ambulatory Visit: Payer: BC Managed Care – PPO

## 2018-11-15 DIAGNOSIS — M542 Cervicalgia: Secondary | ICD-10-CM | POA: Diagnosis not present

## 2018-11-15 DIAGNOSIS — R293 Abnormal posture: Secondary | ICD-10-CM | POA: Diagnosis not present

## 2018-11-15 DIAGNOSIS — M5412 Radiculopathy, cervical region: Secondary | ICD-10-CM

## 2018-11-15 DIAGNOSIS — G44221 Chronic tension-type headache, intractable: Secondary | ICD-10-CM

## 2018-11-15 NOTE — Therapy (Signed)
[facility] [address]  [place], [place], [address] Phone: [phone]   Fax:  [phone]  Physical Therapy Treatment  Patient Details  Name: Brian Lloyd MRN: [id number] Date of Birth: February 13, 1960 Referring Provider (PT): [name], MD   Encounter Date: 11/15/2018  PT End of Session - 11/15/18 0901    Visit Number  4    Number of Visits  12    Date for PT Re-Evaluation  12/17/18    Authorization Type  BCBS - VL: 30    PT Start Time  0845    PT Stop Time  0936    PT Time Calculation (min)  51 min    Activity Tolerance  Patient tolerated treatment well    Behavior During Therapy  [facility] for tasks assessed/performed       Past Medical History:  Diagnosis Date  . Acute cerebral infarction ([place]) 11/18/2015   Bifrontal embolic infarcts MRI/MRA 11/18/15; known aortic valve vegetation on lovenox/coumadin/ASA  . Antiphospholipid antibody syndrome ([place]) 11/06/2015   11/04/15 significant elevation of IgG anticardiolipin & beta-2-GP-1 antibodies  . 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 Aortic valve mass   . 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 Arthritis    hands  . Cerebellar infarct ([place]) 10/06/2015   Cardiac MRI showed acute to subacute lacunar infarct in the left cerebellum  . DVT (deep venous thrombosis) (HCC)    left leg, on Xarelto  . DVT, lower extremity, distal, chronic ([place]) 08/2015   Left calf DVT following injury  . 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 S/P aortic valve replacement with bioprosthetic valve 11/24/2015   25 mm [facility] Ease bovine pericardial tissue valve  . Thrombocytopenia ([place])     Past Surgical History:  Procedure Laterality Date  . AORTIC VALVE REPLACEMENT N/A 11/24/2015   Procedure: AORTIC VALVE REPLACEMENT;  Surgeon: Rexene Alberts, MD;  Location: [place];  Service: Open Heart Surgery;  Laterality: N/A;  . CARDIAC CATHETERIZATION N/A  10/29/2015   Procedure: Coronary/Graft Angiography;  Surgeon: Burnell Blanks, MD;  Location: [place] CV [facility];  Service: Cardiovascular;  Laterality: N/A;  . CORONARY ARTERY BYPASS GRAFT N/A 11/24/2015   Procedure: CORONARY ARTERY BYPASS GRAFTING (CABG)x 2 WITH ENDOSCOPIC HARVESTING OF RIGHT SAPHENOUS VEIN -SVG to LAD -SVG to OM1;  Surgeon: Rexene Alberts, MD;  Location: [place];  Service: Open Heart Surgery;  Laterality: N/A;  . EXCISION OF ATRIAL MYXOMA N/A 11/24/2015   Procedure: EXCISION OF AORTIC VALVE MASS ;  Surgeon: Rexene Alberts, MD;  Location: [place];  Service: Open Heart Surgery;  Laterality: N/A;  . HERNIA REPAIR Bilateral    inguinal  . MOUTH SURGERY     root canal  . TEE WITHOUT CARDIOVERSION N/A 10/22/2015   Procedure: TRANSESOPHAGEAL ECHOCARDIOGRAM (TEE);  Surgeon: Fay Records, MD;  Location: [facility] ENDOSCOPY;  Service: Cardiovascular;  Laterality: N/A;  . TEE WITHOUT CARDIOVERSION N/A 11/24/2015   Procedure: TRANSESOPHAGEAL ECHOCARDIOGRAM (TEE);  Surgeon: Rexene Alberts, MD;  Location: [place];  Service: Open Heart Surgery;  Laterality: N/A;  . TRANSTHORACIC ECHOCARDIOGRAM  10/09/2015   Mild LVH. EF 60-65%. Pseudo-normal grade 2 diastolic dysfunction. 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 TEE recommended.  Marland Kitchen UMBILICAL HERNIA REPAIR N/A 02/01/2018   Procedure: OPEN UMBILICAL HERNIA REPAIR ERAS PATHWAY;  Surgeon: Ileana Roup, MD;  [facility];  Service:  General;  Laterality: N/A;    There were no vitals filed for this visit.  Subjective Assessment - 11/15/18 0850    Subjective  Pt. reporting good relief after last visit.    Pertinent History  cerebellar CVA affecting vision    Diagnostic tests  Per pt, cervical x-ray at MD office shows degenrative changes at C5-6; 11/12/18 - Head CT: No acute intracranial pathology.    Patient Stated Goals  "pain relief & ability to do more physical activities"    Currently in  Pain?  Yes    Pain Score  5     Pain Location  Neck    Pain Orientation  Lower    Pain Descriptors / Indicators  Dull    Pain Type  Chronic pain    Pain Radiating Towards  up into head - migraine at 7/10    Pain Frequency  Intermittent    Multiple Pain Sites  No                       [facility] Adult PT Treatment/Exercise - 11/15/18 0001      Neck Exercises: Theraband   Shoulder External Rotation  15 reps   reduced resistance as pt. noting shoulder soreness last sess   Shoulder External Rotation Limitations  + scap retraction; hooklying over pool noodle      Traction   Type of Traction  Cervical   20 dg flexion pull    Min (lbs)  13    Max (lbs)  20    Hold Time  60    Rest Time  20    Time  13      Manual Therapy   Manual Therapy  Soft tissue mobilization    Manual therapy comments  sitting     Soft tissue mobilization  STM to B UT, scalenes & SCM      Neck Exercises: Stretches   Upper Trapezius Stretch  Right;Left;30 seconds;1 rep    Corner Stretch  30 seconds;3 reps    Corner Stretch Limitations  3-way doorway stretch    Other Neck Stretches  SCM stretch 2 x 30 sec   cueing for proper positioning and improved tech after               PT Short Term Goals - 11/13/18 0926      PT SHORT TERM GOAL #1   Title  Patient will be independent with initial HEP    Status  Achieved   11/13/18     PT SHORT TERM GOAL #2   Title  Patient will verbalize/demonstrate understanding of neutral spine posture and proper body mechanics to reduce strain on cervical spine    Status  Achieved   11/13/18       PT Long Term Goals - 11/07/18 0855      PT LONG TERM GOAL #1   Title  Patient will be independent with ongoing/advanced HEP    Status  On-going      PT LONG TERM GOAL #2   Title  Patient to demonstrate appropriate posture and body mechanics needed for daily activities    Status  On-going      PT LONG TERM GOAL #3   Title  Patient to improve cervical  AROM to WNL/WFL without pain provocation    Status  On-going      PT LONG TERM GOAL #4   Title  Patient to report reduction in frequency and intensity of migraines  by >/= 50%    Status  On-going      PT LONG TERM GOAL #5   Title  Patient to report ability to perform ADLs, household and work-related tasks without increased pain    Status  On-going            Plan - 11/15/18 0901    Clinical Impression Statement  [facility] reporting, "I got good relief after last session!".  Pt. attributes this to manual therapy and Cervical mechanical traction this continued this today.  Performed gentle scapular/postural strengthening however did reduce resistance with band from red>yellow as pt. noting some R shoulder soreness which has remained today.  Heavy review on proper technique with B SCM stretch today and continued cervical and upper shoulder stretches.  Ended visit with pt. leaving session without increased pain.    Personal Factors and Comorbidities  Comorbidity 3+;Time since onset of injury/illness/exacerbation;Past/Current Experience;Profession    Comorbidities  OA; h/o cerebellar/ocualr CVA with residual visual deficits; AVR; CABG; DVT    Rehab Potential  Fair    PT Treatment/Interventions  ADLs/Self Care Home Management;Cryotherapy;Electrical Stimulation;Iontophoresis 4mg /ml Dexamethasone;Moist Heat;Traction;Ultrasound;Functional mobility training;Therapeutic activities;Therapeutic exercise;Balance training;Neuromuscular re-education;Patient/family education;Manual techniques;Passive range of motion;Dry needling;Taping;Spinal Manipulations    PT Next Visit Plan  Postural stretching & strengthening; manual therapy & modalities PRN including traction as benefit noted    PT Home Exercise Plan  11/05/18 - Chin tuck, UT & LS stretches, 3-way doorway pec stretch, scap retraction; 11/13/18 - SCM stretch, cervical rotation SNAGs, hooklying scap retraction + horiz ABD & ER with red TB    Consulted and  Agree with Plan of Care  Patient       Patient will benefit from skilled therapeutic intervention in order to improve the following deficits and impairments:  Pain, Decreased activity tolerance, Decreased coordination, Decreased range of motion, Decreased strength, Hypomobility, Increased muscle spasms, Impaired perceived functional ability, Impaired UE functional use, Improper body mechanics, Postural dysfunction  Visit Diagnosis: Cervicalgia  Radiculopathy, cervical region  Chronic tension-type headache, intractable  Abnormal posture     Problem List Patient Active Problem List   Diagnosis Date Noted  . Encephalopathy 11/12/2018  . S/P aortic valve replacement with bioprosthetic valve 11/24/2015  . Diplopia   . 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 Acute cerebral infarction (HCC) 11/18/2015  . 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 Antiphospholipid antibody syndrome (HCC) 11/06/2015  . Thrombocytopenia (HCC)   . 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 History of CVA (cerebrovascular accident) without residual deficits 10/19/2015  . Cerebellar infarct (HCC) 10/06/2015  . 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

## 2018-11-19 ENCOUNTER — Ambulatory Visit: Payer: BC Managed Care – PPO | Admitting: Physical Therapy

## 2018-11-19 ENCOUNTER — Encounter: Payer: Self-pay | Admitting: Physical Therapy

## 2018-11-19 ENCOUNTER — Other Ambulatory Visit: Payer: Self-pay

## 2018-11-19 ENCOUNTER — Ambulatory Visit: Payer: BC Managed Care – PPO | Admitting: Adult Health

## 2018-11-19 DIAGNOSIS — M5412 Radiculopathy, cervical region: Secondary | ICD-10-CM

## 2018-11-19 DIAGNOSIS — R293 Abnormal posture: Secondary | ICD-10-CM | POA: Diagnosis not present

## 2018-11-19 DIAGNOSIS — G44221 Chronic tension-type headache, intractable: Secondary | ICD-10-CM | POA: Diagnosis not present

## 2018-11-19 DIAGNOSIS — M542 Cervicalgia: Secondary | ICD-10-CM

## 2018-11-19 NOTE — Therapy (Signed)
[facility] [address]  [address] [place], [place], [address] Phone: [phone]   Fax:  [phone]  Physical Therapy Treatment  Patient Details  Name: Brian Lloyd MRN: [id number] Date of Birth: 1960-11-14 Referring Provider (PT): [name], MD   Encounter Date: 11/19/2018  PT End of Session - 11/19/18 0858    Visit Number  5    Number of Visits  12    Date for PT Re-Evaluation  12/17/18    Authorization Type  BCBS - VL: 30    PT Start Time  0858   Pt arrived late   PT Stop Time  0947    PT Time Calculation (min)  49 min    Activity Tolerance  Patient tolerated treatment well    Behavior During Therapy  [facility] for tasks assessed/performed       Past Medical History:  Diagnosis Date  . Acute cerebral infarction (HCC) 11/18/2015   Bifrontal embolic infarcts MRI/MRA 11/18/15; known aortic valve vegetation on lovenox/coumadin/ASA  . Antiphospholipid antibody syndrome (HCC) 11/06/2015   11/04/15 significant elevation of IgG anticardiolipin & beta-2-GP-1 antibodies  . 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 Aortic valve mass   . 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 Arthritis    hands  . 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 DVT (deep venous thrombosis) (HCC)    left leg, on Xarelto  . 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 S/P aortic valve replacement with bioprosthetic valve 11/24/2015   25 mm [facility] Ease bovine pericardial tissue valve  . Thrombocytopenia (HCC)     Past Surgical History:  Procedure Laterality Date  . AORTIC VALVE REPLACEMENT N/A 11/24/2015   Procedure: AORTIC VALVE REPLACEMENT;  Surgeon: Purcell Nails, MD;  Location: [facility] OR;  Service: Open Heart Surgery;  Laterality: N/A;  . CARDIAC  CATHETERIZATION N/A 10/29/2015   Procedure: Coronary/Graft Angiography;  Surgeon: Kathleene Hazel, MD;  Location: [facility] INVASIVE CV LAB;  Service: Cardiovascular;  Laterality: N/A;  . CORONARY ARTERY BYPASS GRAFT N/A 11/24/2015   Procedure: CORONARY ARTERY BYPASS GRAFTING (CABG)x 2 WITH ENDOSCOPIC HARVESTING OF RIGHT SAPHENOUS VEIN -SVG to LAD -SVG to OM1;  Surgeon: Purcell Nails, MD;  Location: [facility] OR;  Service: Open Heart Surgery;  Laterality: N/A;  . EXCISION OF ATRIAL MYXOMA N/A 11/24/2015   Procedure: EXCISION OF AORTIC VALVE MASS ;  Surgeon: Purcell Nails, MD;  Location: [facility] OR;  Service: Open Heart Surgery;  Laterality: N/A;  . HERNIA REPAIR Bilateral    inguinal  . MOUTH SURGERY     root canal  . TEE WITHOUT CARDIOVERSION N/A 10/22/2015   Procedure: TRANSESOPHAGEAL ECHOCARDIOGRAM (TEE);  Surgeon: Pricilla Riffle, MD;  Location: [facility] ENDOSCOPY;  Service: Cardiovascular;  Laterality: N/A;  . TEE WITHOUT CARDIOVERSION N/A 11/24/2015   Procedure: TRANSESOPHAGEAL ECHOCARDIOGRAM (TEE);  Surgeon: Purcell Nails, MD;  Location: [facility] OR;  Service: Open Heart Surgery;  Laterality: N/A;  . TRANSTHORACIC ECHOCARDIOGRAM  10/09/2015   Mild LVH. EF 60-65%. Pseudo-normal grade 2 diastolic dysfunction. 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 TEE recommended.  Marland Kitchen UMBILICAL HERNIA REPAIR N/A 02/01/2018   Procedure: OPEN UMBILICAL HERNIA REPAIR ERAS PATHWAY;  Surgeon: Andria Meuse, MD;  Location:  [facility];  Service: General;  Laterality: N/A;    There were no vitals filed for this visit.  Subjective Assessment - 11/19/18 0901    Subjective  Pt reporting "I have other pains today (back pain) so I'm not as aware of the neck pain today". Reports busy weekend of regrading his driveway including scooping at moving gravel with a snow shovel.    Pertinent History  cerebellar CVA affecting vision    Diagnostic tests  Per pt, cervical x-ray at MD office shows  degenrative changes at C5-6; 11/12/18 - Head CT: No acute intracranial pathology.    Patient Stated Goals  "pain relief & ability to do more physical activities"    Currently in Pain?  Yes    Pain Score  3     Pain Location  Neck    Pain Orientation  Lower    Pain Descriptors / Indicators  Dull    Pain Type  Chronic pain    Pain Radiating Towards  "twinge" in R shoulder; still getting migraines    Pain Frequency  Intermittent                       [facility] Adult PT Treatment/Exercise - 11/19/18 0858      Exercises   Exercises  Neck      Neck Exercises: Machines for Strengthening   UBE (Upper Arm Bike)  L2.0 x 4 min (2' fwd/2' back)      Neck Exercises: Theraband   Shoulder External Rotation  10 reps   yellow TB   Shoulder External Rotation Limitations  + scap retraction; seated    Horizontal ABduction  10 reps   yellow TB   Horizontal ABduction Limitations  + scap retraction; seated    Other Theraband Exercises  Alt UE diagonals + scap retraction with yellow TB 10 x 5 sec; standing      Neck Exercises: Standing   Wall Push Ups  10 reps      Neck Exercises: Seated   Cervical Rotation  Right;Left;10 reps   AAROM   Cervical Rotation Limitations  cervical SNAG - corrected hand position with cues to keep pillowcase/towel in line with jaw & not over side of neck      Neck Exercises: Prone   Neck Retraction  10 reps;5 secs    Neck Retraction Limitations  from POE      Traction   Type of Traction  Cervical   20 dg flexion pull    Min (lbs)  14    Max (lbs)  22    Hold Time  60    Rest Time  20    Time  12      Neck Exercises: Stretches   Other Neck Stretches  R/L shoulder posterior capsule stretch 3 x 30 sec each               PT Short Term Goals - 11/13/18 0926      PT SHORT TERM GOAL #1   Title  Patient will be independent with initial HEP    Status  Achieved   11/13/18     PT SHORT TERM GOAL #2   Title  Patient will verbalize/demonstrate  understanding of neutral spine posture and proper body mechanics to reduce strain on cervical spine    Status  Achieved   11/13/18       PT Long Term Goals - 11/07/18 0855      PT LONG TERM  GOAL #1   Title  Patient will be independent with ongoing/advanced HEP    Status  On-going      PT LONG TERM GOAL #2   Title  Patient to demonstrate appropriate posture and body mechanics needed for daily activities    Status  On-going      PT LONG TERM GOAL #3   Title  Patient to improve cervical AROM to WNL/WFL without pain provocation    Status  On-going      PT LONG TERM GOAL #4   Title  Patient to report reduction in frequency and intensity of migraines by >/= 50%    Status  On-going      PT LONG TERM GOAL #5   Title  Patient to report ability to perform ADLs, household and work-related tasks without increased pain    Status  On-going            Plan - 11/19/18 [place] reporting busy weekend with increased back pain after regrading his driveway however neck pain not significantly changed despite increased activity including heavy shoveling. He also reports improving ability to turn his head while driving the tractor. HEP going well other than a "twinge" noted in his R posterior shoulder when performing scapular retraction + UE diagonals, therefore provided instruction in posterior capsule stretch with pt noting good relief from this. Able to progress scapular retraction exercises to vertical positioning today with good tolerance including no "twinge" with diagonals. Treatment concluded with mechanical cervical traction as patient continues to note benefit from this.    Comorbidities  OA; h/o cerebellar/ocualr CVA with residual visual deficits; AVR; CABG; DVT    Rehab Potential  Fair    PT Frequency  2x / week    PT Duration  6 weeks    PT Treatment/Interventions  ADLs/Self Care Home Management;Cryotherapy;Electrical Stimulation;Iontophoresis  4mg /ml Dexamethasone;Moist Heat;Traction;Ultrasound;Functional mobility training;Therapeutic activities;Therapeutic exercise;Balance training;Neuromuscular re-education;Patient/family education;Manual techniques;Passive range of motion;Dry needling;Taping;Spinal Manipulations    PT Next Visit Plan  Postural stretching & strengthening; manual therapy & modalities PRN including traction as benefit noted    PT Home Exercise Plan  11/05/18 - Chin tuck, UT & LS stretches, 3-way doorway pec stretch, scap retraction; 11/13/18 - SCM stretch, cervical rotation SNAGs, hooklying scap retraction + horiz ABD & ER with red TB    Consulted and Agree with Plan of Care  Patient       Patient will benefit from skilled therapeutic intervention in order to improve the following deficits and impairments:  Pain, Decreased activity tolerance, Decreased coordination, Decreased range of motion, Decreased strength, Hypomobility, Increased muscle spasms, Impaired perceived functional ability, Impaired UE functional use, Improper body mechanics, Postural dysfunction  Visit Diagnosis: Cervicalgia  Radiculopathy, cervical region  Chronic tension-type headache, intractable  Abnormal posture     Problem List Patient Active Problem List   Diagnosis Date Noted  . Encephalopathy 11/12/2018  . S/P aortic valve replacement with bioprosthetic valve 11/24/2015  . Diplopia   . 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 Acute cerebral infarction ([place]) 11/18/2015  . 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 Antiphospholipid antibody syndrome ([place]) 11/06/2015  . Thrombocytopenia ([place])   . 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 History of CVA (cerebrovascular accident) without residual deficits 10/19/2015  . Cerebellar infarct ([place]) 10/06/2015  . 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

## 2018-11-21 ENCOUNTER — Ambulatory Visit: Payer: BC Managed Care – PPO

## 2018-11-26 ENCOUNTER — Other Ambulatory Visit: Payer: Self-pay

## 2018-11-26 ENCOUNTER — Telehealth: Payer: Self-pay | Admitting: *Deleted

## 2018-11-26 ENCOUNTER — Telehealth: Payer: Self-pay | Admitting: Pharmacist

## 2018-11-26 ENCOUNTER — Ambulatory Visit: Payer: BC Managed Care – PPO

## 2018-11-26 DIAGNOSIS — R293 Abnormal posture: Secondary | ICD-10-CM

## 2018-11-26 DIAGNOSIS — M5412 Radiculopathy, cervical region: Secondary | ICD-10-CM | POA: Diagnosis not present

## 2018-11-26 DIAGNOSIS — M542 Cervicalgia: Secondary | ICD-10-CM

## 2018-11-26 DIAGNOSIS — G44221 Chronic tension-type headache, intractable: Secondary | ICD-10-CM

## 2018-11-26 NOTE — Telephone Encounter (Signed)
Patient advised to DECREASE weekly warfarin dose from 45mg /wk to 40mg /wk.

## 2018-11-26 NOTE — Telephone Encounter (Signed)
Received INR result from Patient PT/INR Self Testing   11/26/18  INR - 4.2 Result given to Dr Elie Confer

## 2018-11-26 NOTE — Telephone Encounter (Signed)
Have reviewed. Will call patient with new dosing instructions. Thank you.

## 2018-11-26 NOTE — Telephone Encounter (Signed)
PST POC FS INR result this AM was 4.2. Patient was called and advised to DECREASE dose FROM 45mg  warfarin/wk TO 40mg  warfarin/wk and repeat PST FS POC INR on 03-Dec-2018. Denies and signs or symptoms of bleeding.

## 2018-11-26 NOTE — Therapy (Signed)
[facility] [address]  [address] [place], [place], [address] Phone: [phone]   Fax:  [phone]  Physical Therapy Treatment  Patient Details  Name: Brian Lloyd MRN: [id number] Date of Birth: March 16, 1960 Referring Provider (PT): [name], MD   Encounter Date: 11/26/2018  PT End of Session - 11/26/18 0859    Visit Number  6    Number of Visits  12    Date for PT Re-Evaluation  12/17/18    Authorization Type  BCBS - VL: 30    PT Start Time  0849    PT Stop Time  0950    PT Time Calculation (min)  61 min    Activity Tolerance  Patient tolerated treatment well    Behavior During Therapy  [facility] for tasks assessed/performed       Past Medical History:  Diagnosis Date  . Acute cerebral infarction (HCC) 11/18/2015   Bifrontal embolic infarcts MRI/MRA 11/18/15; known aortic valve vegetation on lovenox/coumadin/ASA  . Antiphospholipid antibody syndrome (HCC) 11/06/2015   11/04/15 significant elevation of IgG anticardiolipin & beta-2-GP-1 antibodies  . 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 Aortic valve mass   . 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 Arthritis    hands  . 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 DVT (deep venous thrombosis) (HCC)    left leg, on Xarelto  . 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 S/P aortic valve replacement with bioprosthetic valve 11/24/2015   25 mm [facility] Ease bovine pericardial tissue valve  . Thrombocytopenia (HCC)     Past Surgical History:  Procedure Laterality Date  . AORTIC VALVE REPLACEMENT N/A 11/24/2015   Procedure: AORTIC VALVE REPLACEMENT;  Surgeon: Purcell Nails, MD;  Location: [facility] OR;  Service: Open Heart Surgery;  Laterality: N/A;  . CARDIAC CATHETERIZATION N/A  10/29/2015   Procedure: Coronary/Graft Angiography;  Surgeon: Kathleene Hazel, MD;  Location: [facility] INVASIVE CV LAB;  Service: Cardiovascular;  Laterality: N/A;  . CORONARY ARTERY BYPASS GRAFT N/A 11/24/2015   Procedure: CORONARY ARTERY BYPASS GRAFTING (CABG)x 2 WITH ENDOSCOPIC HARVESTING OF RIGHT SAPHENOUS VEIN -SVG to LAD -SVG to OM1;  Surgeon: Purcell Nails, MD;  Location: [facility] OR;  Service: Open Heart Surgery;  Laterality: N/A;  . EXCISION OF ATRIAL MYXOMA N/A 11/24/2015   Procedure: EXCISION OF AORTIC VALVE MASS ;  Surgeon: Purcell Nails, MD;  Location: [facility] OR;  Service: Open Heart Surgery;  Laterality: N/A;  . HERNIA REPAIR Bilateral    inguinal  . MOUTH SURGERY     root canal  . TEE WITHOUT CARDIOVERSION N/A 10/22/2015   Procedure: TRANSESOPHAGEAL ECHOCARDIOGRAM (TEE);  Surgeon: Pricilla Riffle, MD;  Location: [facility] ENDOSCOPY;  Service: Cardiovascular;  Laterality: N/A;  . TEE WITHOUT CARDIOVERSION N/A 11/24/2015   Procedure: TRANSESOPHAGEAL ECHOCARDIOGRAM (TEE);  Surgeon: Purcell Nails, MD;  Location: [facility] OR;  Service: Open Heart Surgery;  Laterality: N/A;  . TRANSTHORACIC ECHOCARDIOGRAM  10/09/2015   Mild LVH. EF 60-65%. Pseudo-normal grade 2 diastolic dysfunction. 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 TEE recommended.  Marland Kitchen UMBILICAL HERNIA REPAIR N/A 02/01/2018   Procedure: OPEN UMBILICAL HERNIA REPAIR ERAS PATHWAY;  Surgeon: Andria Meuse, MD;  Location: [facility] ORS;  Service:  General;  Laterality: N/A;    There were no vitals filed for this visit.  Subjective Assessment - 11/26/18 0853    Subjective  Pt. noting he feels traction is helping.    Pertinent History  cerebellar CVA affecting vision    Diagnostic tests  Per pt, cervical x-ray at MD office shows degenrative changes at C5-6; 11/12/18 - Head CT: No acute intracranial pathology.    Patient Stated Goals  "pain relief & ability to do more physical activities"    Currently in Pain?   Yes    Pain Score  8     Pain Location  Neck    Pain Orientation  Lower    Pain Descriptors / Indicators  Aching    Pain Type  Chronic pain    Pain Radiating Towards  into posterior neck and upper R shoulder    Pain Frequency  Intermittent                       [facility] Adult PT Treatment/Exercise - 11/26/18 0001      Neck Exercises: Machines for Strengthening   UBE (Upper Arm Bike)  L2.5 x 6 min (3' fwd/3' back)    Cybex Row  25# 2 x 10 reps     Lat Pull  20# x 10 reps       Neck Exercises: Theraband   Shoulder External Rotation  15 reps   yellow TB    Shoulder External Rotation Limitations  3" hold; scapular retraction     Other Theraband Exercises  Alt UE diagonals + scap retraction with yellow TB 10 x 5 sec; standing   at doorframe      Traction   Type of Traction  Cervical    Min (lbs)  16    Max (lbs)  24    Hold Time  60    Rest Time  20    Time  14      Manual Therapy   Manual Therapy  Myofascial release    Myofascial Release  TPR to B UT      Neck Exercises: Stretches   Upper Trapezius Stretch  Right;Left;30 seconds;2 reps    Upper Trapezius Stretch Limitations  B hands anchored on table                PT Short Term Goals - 11/13/18 0926      PT SHORT TERM GOAL #1   Title  Patient will be independent with initial HEP    Status  Achieved   11/13/18     PT SHORT TERM GOAL #2   Title  Patient will verbalize/demonstrate understanding of neutral spine posture and proper body mechanics to reduce strain on cervical spine    Status  Achieved   11/13/18       PT Long Term Goals - 11/07/18 0855      PT LONG TERM GOAL #1   Title  Patient will be independent with ongoing/advanced HEP    Status  On-going      PT LONG TERM GOAL #2   Title  Patient to demonstrate appropriate posture and body mechanics needed for daily activities    Status  On-going      PT LONG TERM GOAL #3   Title  Patient to improve cervical AROM to WNL/WFL without  pain provocation    Status  On-going      PT LONG TERM GOAL #4   Title  Patient to  report reduction in frequency and intensity of migraines by >/= 50%    Status  On-going      PT LONG TERM GOAL #5   Title  Patient to report ability to perform ADLs, household and work-related tasks without increased pain    Status  On-going            Plan - 11/26/18 0900    Clinical Impression Statement  Pt. noting good relief from pain with traction performed last session.  Was feeling pretty good until "cutting wood" and "carrying logs on shoulders" over the weekend which he feels has increased his neck/R shoulder pain today.  MT addressing palpable TPs/increased tension in B UT, R>L and therex focused on postural/scapular strengthening activities.  Able to progress with therex in session without additional pain.  Ended with progression of mechanical cervical traction to 24#/16# in 20 dg cervical flexion positioning in hooklying with pt. noting pain relief following this.    Personal Factors and Comorbidities  Comorbidity 3+;Time since onset of injury/illness/exacerbation;Past/Current Experience;Profession    Comorbidities  OA; h/o cerebellar/ocualr CVA with residual visual deficits; AVR; CABG; DVT    Rehab Potential  Fair    PT Treatment/Interventions  ADLs/Self Care Home Management;Cryotherapy;Electrical Stimulation;Iontophoresis 4mg /ml Dexamethasone;Moist Heat;Traction;Ultrasound;Functional mobility training;Therapeutic activities;Therapeutic exercise;Balance training;Neuromuscular re-education;Patient/family education;Manual techniques;Passive range of motion;Dry needling;Taping;Spinal Manipulations    PT Next Visit Plan  Postural stretching & strengthening; manual therapy & modalities PRN including traction as benefit noted    PT Home Exercise Plan  11/05/18 - Chin tuck, UT & LS stretches, 3-way doorway pec stretch, scap retraction; 11/13/18 - SCM stretch, cervical rotation SNAGs, hooklying scap  retraction + horiz ABD & ER with red TB    Consulted and Agree with Plan of Care  Patient       Patient will benefit from skilled therapeutic intervention in order to improve the following deficits and impairments:  Pain, Decreased activity tolerance, Decreased coordination, Decreased range of motion, Decreased strength, Hypomobility, Increased muscle spasms, Impaired perceived functional ability, Impaired UE functional use, Improper body mechanics, Postural dysfunction  Visit Diagnosis: Cervicalgia  Radiculopathy, cervical region  Chronic tension-type headache, intractable  Abnormal posture     Problem List Patient Active Problem List   Diagnosis Date Noted  . Encephalopathy 11/12/2018  . S/P aortic valve replacement with bioprosthetic valve 11/24/2015  . Diplopia   . 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 Acute cerebral infarction ([place]) 11/18/2015  . Libman-Sacks endocarditis ([place])   . Antiphospholipid antibody syndrome ([place]) 11/06/2015  . Thrombocytopenia ([place])   . 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 History of CVA (cerebrovascular accident) without residual deficits 10/19/2015  . Cerebellar infarct ([place]) 10/06/2015  . 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

## 2018-11-27 ENCOUNTER — Encounter: Payer: Self-pay | Admitting: Internal Medicine

## 2018-11-28 ENCOUNTER — Other Ambulatory Visit: Payer: Self-pay

## 2018-11-28 ENCOUNTER — Ambulatory Visit: Payer: BC Managed Care – PPO

## 2018-11-28 DIAGNOSIS — R293 Abnormal posture: Secondary | ICD-10-CM

## 2018-11-28 DIAGNOSIS — M5412 Radiculopathy, cervical region: Secondary | ICD-10-CM | POA: Diagnosis not present

## 2018-11-28 DIAGNOSIS — G44221 Chronic tension-type headache, intractable: Secondary | ICD-10-CM | POA: Diagnosis not present

## 2018-11-28 DIAGNOSIS — M542 Cervicalgia: Secondary | ICD-10-CM

## 2018-11-28 NOTE — Therapy (Signed)
[facility] Outpatient Rehabilitation [facility] [address]  [address] [place], [place], [address] Phone: [phone]   Fax:  [phone]  Physical Therapy Treatment  Patient Details  Name: Brian Lloyd MRN: [id number] Date of Birth: 21-Sep-1960 Referring Provider (PT): [name], MD   Encounter Date: 11/28/2018  PT End of Session - 11/28/18 0856    Visit Number  7    Number of Visits  12    Date for PT Re-Evaluation  12/17/18    Authorization Type  BCBS - VL: 30    PT Start Time  0849    PT Stop Time  0953    PT Time Calculation (min)  64 min    Activity Tolerance  Patient tolerated treatment well    Behavior During Therapy  [facility] for tasks assessed/performed       Past Medical History:  Diagnosis Date  . Acute cerebral infarction (HCC) 11/18/2015   Bifrontal embolic infarcts MRI/MRA 11/18/15; known aortic valve vegetation on lovenox/coumadin/ASA  . Antiphospholipid antibody syndrome (HCC) 11/06/2015   11/04/15 significant elevation of IgG anticardiolipin & beta-2-GP-1 antibodies  . 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 Aortic valve mass   . 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 Arthritis    hands  . 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 DVT (deep venous thrombosis) (HCC)    left leg, on Xarelto  . 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 S/P aortic valve replacement with bioprosthetic valve 11/24/2015   25 mm [facility] Ease bovine pericardial tissue valve  . Thrombocytopenia (HCC)     Past Surgical History:  Procedure Laterality Date  . AORTIC VALVE REPLACEMENT N/A 11/24/2015   Procedure: AORTIC VALVE REPLACEMENT;  Surgeon: Purcell Nails, MD;  Location: [facility] OR;  Service: Open Heart Surgery;  Laterality: N/A;  . CARDIAC CATHETERIZATION N/A  10/29/2015   Procedure: Coronary/Graft Angiography;  Surgeon: Kathleene Hazel, MD;  Location: [facility] INVASIVE CV LAB;  Service: Cardiovascular;  Laterality: N/A;  . CORONARY ARTERY BYPASS GRAFT N/A 11/24/2015   Procedure: CORONARY ARTERY BYPASS GRAFTING (CABG)x 2 WITH ENDOSCOPIC HARVESTING OF RIGHT SAPHENOUS VEIN -SVG to LAD -SVG to OM1;  Surgeon: Purcell Nails, MD;  Location: [facility] OR;  Service: Open Heart Surgery;  Laterality: N/A;  . EXCISION OF ATRIAL MYXOMA N/A 11/24/2015   Procedure: EXCISION OF AORTIC VALVE MASS ;  Surgeon: Purcell Nails, MD;  Location: [facility] OR;  Service: Open Heart Surgery;  Laterality: N/A;  . HERNIA REPAIR Bilateral    inguinal  . MOUTH SURGERY     root canal  . TEE WITHOUT CARDIOVERSION N/A 10/22/2015   Procedure: TRANSESOPHAGEAL ECHOCARDIOGRAM (TEE);  Surgeon: Pricilla Riffle, MD;  Location: [facility] ENDOSCOPY;  Service: Cardiovascular;  Laterality: N/A;  . TEE WITHOUT CARDIOVERSION N/A 11/24/2015   Procedure: TRANSESOPHAGEAL ECHOCARDIOGRAM (TEE);  Surgeon: Purcell Nails, MD;  Location: [facility] OR;  Service: Open Heart Surgery;  Laterality: N/A;  . TRANSTHORACIC ECHOCARDIOGRAM  10/09/2015   Mild LVH. EF 60-65%. Pseudo-normal grade 2 diastolic dysfunction. 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 TEE recommended.  Marland Kitchen UMBILICAL HERNIA REPAIR N/A 02/01/2018   Procedure: OPEN UMBILICAL HERNIA REPAIR ERAS PATHWAY;  Surgeon: Andria Meuse, MD;  Location: [facility] ORS;  Service:  General;  Laterality: N/A;    There were no vitals filed for this visit.  Subjective Assessment - 11/28/18 0853    Subjective  Pt. reporting some tenderness "under ear bones" after traction last session.    Pertinent History  cerebellar CVA affecting vision    Diagnostic tests  Per pt, cervical x-ray at MD office shows degenrative changes at C5-6; 11/12/18 - Head CT: No acute intracranial pathology.    Patient Stated Goals  "pain relief & ability to do more physical  activities"    Currently in Pain?  Yes    Pain Score  1     Pain Location  Neck    Pain Orientation  Lower    Pain Descriptors / Indicators  Aching    Pain Type  Chronic pain    Multiple Pain Sites  Yes    Pain Score  3    Pain Location  Head    Pain Orientation  Left;Right    Pain Descriptors / Indicators  Aching    Pain Type  Acute pain    Pain Onset  More than a month ago    Pain Frequency  Intermittent    Aggravating Factors   mornings, unsure                       [facility] Adult PT Treatment/Exercise - 11/28/18 0001      Neck Exercises: Machines for Strengthening   UBE (Upper Arm Bike)  L2.5 x 6 min (3' fwd/3' back)      Neck Exercises: Theraband   Shoulder Extension  10 reps    Shoulder Extension Limitations  yellow band; standing     Rows  15 reps;Red    Rows Limitations  3" hold - standing     Shoulder External Rotation  15 reps    Shoulder External Rotation Limitations  yellow TB - hooklying     Horizontal ABduction  10 reps    Horizontal ABduction Limitations  yellow TB standing; performed in low ROM      Traction   Type of Traction  Cervical    Min (lbs)  17    Max (lbs)  26    Hold Time  60    Rest Time  20    Time  14      Manual Therapy   Manual Therapy  Passive ROM    Passive ROM  cervical PROM all directions to tolerance; B SCM, UT, LS, scalenes stretch each way      Neck Exercises: Stretches   Upper Trapezius Stretch  Right;Left;1 rep;30 seconds    Levator Stretch  Right;Left;30 seconds;1 rep               PT Short Term Goals - 11/13/18 0926      PT SHORT TERM GOAL #1   Title  Patient will be independent with initial HEP    Status  Achieved   11/13/18     PT SHORT TERM GOAL #2   Title  Patient will verbalize/demonstrate understanding of neutral spine posture and proper body mechanics to reduce strain on cervical spine    Status  Achieved   11/13/18       PT Long Term Goals - 11/07/18 0855      PT LONG TERM GOAL  #1   Title  Patient will be independent with ongoing/advanced HEP    Status  On-going      PT LONG TERM GOAL #2  Title  Patient to demonstrate appropriate posture and body mechanics needed for daily activities    Status  On-going      PT LONG TERM GOAL #3   Title  Patient to improve cervical AROM to WNL/WFL without pain provocation    Status  On-going      PT LONG TERM GOAL #4   Title  Patient to report reduction in frequency and intensity of migraines by >/= 50%    Status  On-going      PT LONG TERM GOAL #5   Title  Patient to report ability to perform ADLs, household and work-related tasks without increased pain    Status  On-going            Plan - 11/28/18 4008    Clinical Impression Statement  Pt. reporting good improvement in neck pain since starting PT however notes no real change in migraines since start of therapy.  Progressed scapular/postural strengthening today without increased pain.  Pt. did note upper shoulder fatigue after band scapular work.  Notes some tenderness under B "ear bones" after traction last session however opting to continue traction today thus ended session with this.  Pt. reports MRI may be scheduled in future for hopeful information regarding cause of headaches.  Pt. progressing toward LTG #4.    Personal Factors and Comorbidities  Comorbidity 3+;Time since onset of injury/illness/exacerbation;Past/Current Experience;Profession    Comorbidities  OA; h/o cerebellar/ocualr CVA with residual visual deficits; AVR; CABG; DVT    Rehab Potential  Fair    PT Treatment/Interventions  ADLs/Self Care Home Management;Cryotherapy;Electrical Stimulation;Iontophoresis 4mg /ml Dexamethasone;Moist Heat;Traction;Ultrasound;Functional mobility training;Therapeutic activities;Therapeutic exercise;Balance training;Neuromuscular re-education;Patient/family education;Manual techniques;Passive range of motion;Dry needling;Taping;Spinal Manipulations    PT Next Visit Plan   Postural stretching & strengthening; manual therapy & modalities PRN including traction as benefit noted    PT Home Exercise Plan  11/05/18 - Chin tuck, UT & LS stretches, 3-way doorway pec stretch, scap retraction; 11/13/18 - SCM stretch, cervical rotation SNAGs, hooklying scap retraction + horiz ABD & ER with red TB    Consulted and Agree with Plan of Care  Patient       Patient will benefit from skilled therapeutic intervention in order to improve the following deficits and impairments:  Pain, Decreased activity tolerance, Decreased coordination, Decreased range of motion, Decreased strength, Hypomobility, Increased muscle spasms, Impaired perceived functional ability, Impaired UE functional use, Improper body mechanics, Postural dysfunction  Visit Diagnosis: Cervicalgia  Radiculopathy, cervical region  Chronic tension-type headache, intractable  Abnormal posture     Problem List Patient Active Problem List   Diagnosis Date Noted  . Encephalopathy 11/12/2018  . S/P aortic valve replacement with bioprosthetic valve 11/24/2015  . Diplopia   . 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 Acute cerebral infarction ([place]) 11/18/2015  . Libman-Sacks endocarditis ([place])   . Antiphospholipid antibody syndrome ([place]) 11/06/2015  . Thrombocytopenia ([place])   . 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 History of CVA (cerebrovascular accident) without residual deficits 10/19/2015  . Cerebellar infarct ([place]) 10/06/2015  . 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

## 2018-11-29 ENCOUNTER — Telehealth: Payer: Self-pay | Admitting: Neurology

## 2018-11-29 NOTE — Telephone Encounter (Signed)
Ok to switch. -VRP 

## 2018-11-29 NOTE — Telephone Encounter (Signed)
Pt has been referred back to [place] for intermittent confusion and to rule out partial seizures. He has seen Dr. Leonie Man in the past (once in 2018), but is requesting to switch to Dr. Leta Baptist. Would you be ok with this?

## 2018-11-29 NOTE — Telephone Encounter (Signed)
Ok with me 

## 2018-12-03 ENCOUNTER — Encounter: Payer: Self-pay | Admitting: *Deleted

## 2018-12-03 ENCOUNTER — Telehealth: Payer: Self-pay | Admitting: *Deleted

## 2018-12-03 NOTE — Telephone Encounter (Signed)
Patient called me on Saturday 01-Dec-2018 at home and advised me his INR was 3.5. (Target 2.5 - 3.5). He was instructed at that time to reduce to  ONE (1) tablet of his warfarin all days of week--EXCEPT on Wednesdays, take 1 and 1/2 tablets. Repeat INR on 10-Dec-2018.

## 2018-12-03 NOTE — Telephone Encounter (Signed)
He had shared with me on Saturday and I provided him instructions from the [place] program. Will make an entry in to [facility]. Thank you.

## 2018-12-03 NOTE — Telephone Encounter (Signed)
INR self testing  10/31 INR  3.5

## 2018-12-04 ENCOUNTER — Other Ambulatory Visit: Payer: Self-pay

## 2018-12-04 ENCOUNTER — Ambulatory Visit: Payer: BC Managed Care – PPO | Attending: Orthopedic Surgery | Admitting: Physical Therapy

## 2018-12-04 ENCOUNTER — Encounter: Payer: Self-pay | Admitting: Physical Therapy

## 2018-12-04 DIAGNOSIS — G44221 Chronic tension-type headache, intractable: Secondary | ICD-10-CM | POA: Insufficient documentation

## 2018-12-04 DIAGNOSIS — M5412 Radiculopathy, cervical region: Secondary | ICD-10-CM | POA: Diagnosis not present

## 2018-12-04 DIAGNOSIS — M542 Cervicalgia: Secondary | ICD-10-CM | POA: Diagnosis not present

## 2018-12-04 DIAGNOSIS — R293 Abnormal posture: Secondary | ICD-10-CM | POA: Insufficient documentation

## 2018-12-04 NOTE — Patient Instructions (Signed)
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
## 2018-12-06 ENCOUNTER — Ambulatory Visit: Payer: BC Managed Care – PPO

## 2018-12-10 ENCOUNTER — Other Ambulatory Visit: Payer: Self-pay

## 2018-12-10 ENCOUNTER — Telehealth: Payer: Self-pay | Admitting: Pharmacist

## 2018-12-10 ENCOUNTER — Ambulatory Visit: Payer: BC Managed Care – PPO

## 2018-12-10 DIAGNOSIS — R293 Abnormal posture: Secondary | ICD-10-CM | POA: Diagnosis not present

## 2018-12-10 DIAGNOSIS — G44221 Chronic tension-type headache, intractable: Secondary | ICD-10-CM

## 2018-12-10 DIAGNOSIS — M5412 Radiculopathy, cervical region: Secondary | ICD-10-CM

## 2018-12-10 DIAGNOSIS — M542 Cervicalgia: Secondary | ICD-10-CM | POA: Diagnosis not present

## 2018-12-10 NOTE — Therapy (Signed)
[facility] [address]  [place], [place], [address] Phone: [phone]   Fax:  [phone]  Physical Therapy Treatment  Patient Details  Name: Brian Lloyd MRN: [id number] Date of Birth: [birth year] Referring Provider (PT): [name], MD   Encounter Date: 12/10/2018  PT End of Session - 12/10/18 0850    Visit Number  9    Number of Visits  12    Date for PT Re-Evaluation  12/17/18    Authorization Type  BCBS - VL: 30    PT Start Time  0840    PT Stop Time  0930    PT Time Calculation (min)  50 min    Activity Tolerance  Patient tolerated treatment well    Behavior During Therapy  [facility] for tasks assessed/performed       Past Medical History:  Diagnosis Date  . Acute cerebral infarction ([place]) 11/18/2015   Bifrontal embolic infarcts MRI/MRA 11/18/15; known aortic valve vegetation on lovenox/coumadin/ASA  . Antiphospholipid antibody syndrome ([place]) 11/06/2015   11/04/15 significant elevation of IgG anticardiolipin & beta-2-GP-1 antibodies  . 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 Aortic valve mass   . 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 Arthritis    hands  . Cerebellar infarct ([place]) 10/06/2015   Cardiac MRI showed acute to subacute lacunar infarct in the left cerebellum  . DVT (deep venous thrombosis) (HCC)    left leg, on Xarelto  . DVT, lower extremity, distal, chronic ([place]) 08/2015   Left calf DVT following injury  . 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 S/P aortic valve replacement with bioprosthetic valve 11/24/2015   25 mm [facility] Ease bovine pericardial tissue valve  . Thrombocytopenia ([place])     Past Surgical History:  Procedure Laterality Date  . AORTIC VALVE REPLACEMENT N/A 11/24/2015   Procedure: AORTIC VALVE REPLACEMENT;  Surgeon: Rexene Alberts, MD;  Location: [place];  Service: Open Heart Surgery;  Laterality: N/A;  . CARDIAC CATHETERIZATION N/A  10/29/2015   Procedure: Coronary/Graft Angiography;  Surgeon: Burnell Blanks, MD;  Location: [place] [facility];  Service: Cardiovascular;  Laterality: N/A;  . CORONARY ARTERY BYPASS GRAFT N/A 11/24/2015   Procedure: CORONARY ARTERY BYPASS GRAFTING (CABG)x 2 WITH ENDOSCOPIC HARVESTING OF RIGHT SAPHENOUS VEIN -SVG to LAD -SVG to OM1;  Surgeon: Rexene Alberts, MD;  Location: [place];  Service: Open Heart Surgery;  Laterality: N/A;  . EXCISION OF ATRIAL MYXOMA N/A 11/24/2015   Procedure: EXCISION OF AORTIC VALVE MASS ;  Surgeon: Rexene Alberts, MD;  Location: [place];  Service: Open Heart Surgery;  Laterality: N/A;  . HERNIA REPAIR Bilateral    inguinal  . MOUTH SURGERY     root canal  . TEE WITHOUT CARDIOVERSION N/A 10/22/2015   Procedure: TRANSESOPHAGEAL ECHOCARDIOGRAM (TEE);  Surgeon: Fay Records, MD;  Location: [facility] ENDOSCOPY;  Service: Cardiovascular;  Laterality: N/A;  . TEE WITHOUT CARDIOVERSION N/A 11/24/2015   Procedure: TRANSESOPHAGEAL ECHOCARDIOGRAM (TEE);  Surgeon: Rexene Alberts, MD;  Location: [place];  Service: Open Heart Surgery;  Laterality: N/A;  . TRANSTHORACIC ECHOCARDIOGRAM  10/09/2015   Mild LVH. EF 60-65%. Pseudo-normal grade 2 diastolic dysfunction. 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 TEE recommended.  Marland Kitchen UMBILICAL HERNIA REPAIR N/A 02/01/2018   Procedure: OPEN UMBILICAL HERNIA REPAIR ERAS PATHWAY;  Surgeon: Ileana Roup, MD;  [facility];  Service:  General;  Laterality: N/A;    There were no vitals filed for this visit.  Subjective Assessment - 12/10/18 0843    Subjective  Pt. reporting average pain level for neck pain 2/10.  Does feel that DN provided him "neck and upper shoulders" pain relief for 2.5 days after last session.    Pertinent History  cerebellar CVA affecting vision    Diagnostic tests  Per pt, cervical x-ray at MD office shows degenrative changes at C5-6; 11/12/18 - Head CT: No acute intracranial  pathology.    Patient Stated Goals  "pain relief & ability to do more physical activities"    Currently in Pain?  Yes    Pain Score  2     Pain Location  Neck    Pain Orientation  Lower    Pain Descriptors / Indicators  Aching    Pain Type  Chronic pain    Pain Radiating Towards  into R lateral shoulder    Pain Frequency  Intermittent    Aggravating Factors   working on ladder with overhead scrubbing windows    Multiple Pain Sites  Yes    Pain Score  2    Pain Location  Head    Pain Orientation  Right   Frontal   Pain Descriptors / Indicators  Aching                       [facility] Adult PT Treatment/Exercise - 12/10/18 0001      Neck Exercises: Machines for Strengthening   UBE (Upper Arm Bike)  L3.0 x 6 min (3' fwd/3' back)      Neck Exercises: Theraband   Shoulder External Rotation  10 reps    Shoulder External Rotation Limitations  seated with red band      Neck Exercises: Standing   Other Standing Exercises  Standing TRX low row 3" x 10 reps      Neck Exercises: Prone   Neck Retraction  15 reps;5 secs      Shoulder Exercises: Prone   Flexion  Both;10 reps;Strengthening    Flexion Limitations  "Y" leaning prone orange p-ball     Extension  Both;15 reps;Strengthening    Extension Limitations  "I" leaning prone over orange p-ball on mat table    Horizontal ABduction 1  Both;10 reps;Strengthening    Horizontal ABduction 2  Both;10 reps;Strengthening    Horizontal ABduction 2 Limitations  "T" prone over orange p-ball       Manual Therapy   Manual Therapy  Soft tissue mobilization    Manual therapy comments  supine    Soft tissue mobilization  STM to B suboccipitals     Myofascial Release  B suboccipital release      Neck Exercises: Stretches   Upper Trapezius Stretch  Right;Left;1 rep;30 seconds   opposite UE holding 5#   Levator Stretch  Right;Left;30 seconds;1 rep   opposite UE holding 5#    Corner Stretch  30 seconds;2 reps    Corner Stretch  Limitations  low and middle             PT Education - 12/10/18 1302    Education Details  HEP update; prone I's, T's, prone cervical retraction    Person(s) Educated  Patient    Methods  Explanation;Demonstration;Verbal cues;Handout    Comprehension  Verbalized understanding;Returned demonstration;Verbal cues required       PT Short Term Goals - 11/13/18 0926      PT  SHORT TERM GOAL #1   Title  Patient will be independent with initial HEP    Status  Achieved   11/13/18     PT SHORT TERM GOAL #2   Title  Patient will verbalize/demonstrate understanding of neutral spine posture and proper body mechanics to reduce strain on cervical spine    Status  Achieved   11/13/18       PT Long Term Goals - 12/04/18 1015      PT LONG TERM GOAL #1   Title  Patient will be independent with ongoing/advanced HEP    Status  Partially Met      PT LONG TERM GOAL #2   Title  Patient to demonstrate appropriate posture and body mechanics needed for daily activities    Status  Partially Met      PT LONG TERM GOAL #3   Title  Patient to improve cervical AROM to WNL/WFL without pain provocation    Status  Partially Met      PT LONG TERM GOAL #4   Title  Patient to report reduction in frequency and intensity of migraines by >/= 50%    Status  On-going      PT LONG TERM GOAL #5   Title  Patient to report ability to perform ADLs, household and work-related tasks without increased pain    Status  On-going            Plan - 12/10/18 0858    Clinical Impression Statement  Brian Lloyd reporting 2.5 days of relief from neck pain and somewhat improved headache intensity after last session's DN.  Feels that DN improved his cervical ROM and would be open to further DN in future visits.  Admits to cleaning twenty-four windows at his home over weekend while standing on ladder which he feels increased his R shoulder pain.  Session today focused on progression of cervical and periscapular  strengthening along with continued anterior chest stretching.  Pt. noted decreased pain to end therex from initial subjective report and denied need for modalities to end session.  HEP updated (see pt. education).    Personal Factors and Comorbidities  Comorbidity 3+;Time since onset of injury/illness/exacerbation;Past/Current Experience;Profession    Comorbidities  OA; h/o cerebellar/ocualr CVA with residual visual deficits; AVR; CABG; DVT    Rehab Potential  Fair    PT Treatment/Interventions  ADLs/Self Care Home Management;Cryotherapy;Electrical Stimulation;Iontophoresis 15m/ml Dexamethasone;Moist Heat;Traction;Ultrasound;Functional mobility training;Therapeutic activities;Therapeutic exercise;Balance training;Neuromuscular re-education;Patient/family education;Manual techniques;Passive range of motion;Dry needling;Taping;Spinal Manipulations    PT Home Exercise Plan  11/05/18 - Chin tuck, UT & LS stretches, 3-way doorway pec stretch, scap retraction; 11/13/18 - SCM stretch, cervical rotation SNAGs, hooklying scap retraction + horiz ABD & ER with red TB;  12/10/18 - prone I's, T's, Prone cerv retraction    Consulted and Agree with Plan of Care  Patient       Patient will benefit from skilled therapeutic intervention in order to improve the following deficits and impairments:  Pain, Decreased activity tolerance, Decreased coordination, Decreased range of motion, Decreased strength, Hypomobility, Increased muscle spasms, Impaired perceived functional ability, Impaired UE functional use, Improper body mechanics, Postural dysfunction  Visit Diagnosis: Cervicalgia  Radiculopathy, cervical region  Chronic tension-type headache, intractable  Abnormal posture     Problem List Patient Active Problem List   Diagnosis Date Noted  . Encephalopathy 11/12/2018  . S/P aortic valve replacement with bioprosthetic valve 11/24/2015  . Diplopia   . Cerebrovascular accident (CVA) due to embolism of  vertebral artery (  [place]) 11/19/2015  . Acute cerebral infarction ([place]) 11/18/2015  . 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 Thrombocytopenia ([place])   . 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 History of CVA (cerebrovascular accident) without residual deficits 10/19/2015  . Cerebellar infarct ([place]) 10/06/2015  . 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  [place], [place], [address] Phone: [phone]   Fax:  [phone]  Name: Brian Lloyd MRN: [id number] Date of Birth: September 03, 1960

## 2018-12-10 NOTE — Telephone Encounter (Signed)
Patient calls with POC FS PST INR results from today = 3.0 on 37.5mg  warfarin/wk. Advised to CONTINUE same regimen (5mg  QD--Except on Wednesdays, take 7.5mg ) Repeat weekly. Repeat INR 24-Dec-2018.

## 2018-12-12 ENCOUNTER — Ambulatory Visit: Payer: BC Managed Care – PPO | Admitting: Physical Therapy

## 2018-12-12 ENCOUNTER — Encounter: Payer: Self-pay | Admitting: Physical Therapy

## 2018-12-12 ENCOUNTER — Other Ambulatory Visit: Payer: Self-pay

## 2018-12-12 DIAGNOSIS — R293 Abnormal posture: Secondary | ICD-10-CM

## 2018-12-12 DIAGNOSIS — M5412 Radiculopathy, cervical region: Secondary | ICD-10-CM

## 2018-12-12 DIAGNOSIS — M542 Cervicalgia: Secondary | ICD-10-CM

## 2018-12-12 DIAGNOSIS — G44221 Chronic tension-type headache, intractable: Secondary | ICD-10-CM | POA: Diagnosis not present

## 2018-12-12 NOTE — Therapy (Signed)
[facility] [address]  [place], [place], [address] Phone: [phone]   Fax:  [phone]  Physical Therapy Treatment / Progress Note / Recert  Patient Details  Name: Brian Lloyd MRN: [id number] Date of Birth: 05-05-1960 Referring Provider (PT): [name], MD  Progress Note  Reporting Period 11/05/2018 to 12/12/2018  See note below for Objective Data and Assessment of Progress/Goals.     Encounter Date: 12/12/2018  PT End of Session - 12/12/18 0857    Visit Number  10    Number of Visits  16    Date for PT Re-Evaluation  01/16/19    Authorization Type  BCBS - VL: 30    PT Start Time  0852    PT Stop Time  0937    PT Time Calculation (min)  45 min    Activity Tolerance  Patient tolerated treatment well    Behavior During Therapy  [facility] for tasks assessed/performed       Past Medical History:  Diagnosis Date  . Acute cerebral infarction ([place]) 11/18/2015   Bifrontal embolic infarcts MRI/MRA 11/18/15; known aortic valve vegetation on lovenox/coumadin/ASA  . Antiphospholipid antibody syndrome ([place]) 11/06/2015   11/04/15 significant elevation of IgG anticardiolipin & beta-2-GP-1 antibodies  . 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 Aortic valve mass   . 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 Arthritis    hands  . Cerebellar infarct ([place]) 10/06/2015   Cardiac MRI showed acute to subacute lacunar infarct in the left cerebellum  . DVT (deep venous thrombosis) (HCC)    left leg, on Xarelto  . DVT, lower extremity, distal, chronic ([place]) 08/2015   Left calf DVT following injury  . Libman-Sacks endocarditis ([place])   . S/P aortic valve replacement with bioprosthetic valve 11/24/2015   25 mm [facility] Ease bovine pericardial tissue valve  . Thrombocytopenia ([place])     Past Surgical History:  Procedure Laterality Date  . AORTIC VALVE REPLACEMENT N/A 11/24/2015   Procedure:  AORTIC VALVE REPLACEMENT;  Surgeon: Rexene Alberts, MD;  Location: [place];  Service: Open Heart Surgery;  Laterality: N/A;  . CARDIAC CATHETERIZATION N/A 10/29/2015   Procedure: Coronary/Graft Angiography;  Surgeon: Burnell Blanks, MD;  Location: [facility];  Service: Cardiovascular;  Laterality: N/A;  . CORONARY ARTERY BYPASS GRAFT N/A 11/24/2015   Procedure: CORONARY ARTERY BYPASS GRAFTING (CABG)x 2 WITH ENDOSCOPIC HARVESTING OF RIGHT SAPHENOUS VEIN -SVG to LAD -SVG to OM1;  Surgeon: Rexene Alberts, MD;  Location: [place];  Service: Open Heart Surgery;  Laterality: N/A;  . EXCISION OF ATRIAL MYXOMA N/A 11/24/2015   Procedure: EXCISION OF AORTIC VALVE MASS ;  Surgeon: Rexene Alberts, MD;  Location: [place];  Service: Open Heart Surgery;  Laterality: N/A;  . HERNIA REPAIR Bilateral    inguinal  . MOUTH SURGERY     root canal  . TEE WITHOUT CARDIOVERSION N/A 10/22/2015   Procedure: TRANSESOPHAGEAL ECHOCARDIOGRAM (TEE);  Surgeon: Fay Records, MD;  Location: [facility] ENDOSCOPY;  Service: Cardiovascular;  Laterality: N/A;  . TEE WITHOUT CARDIOVERSION N/A 11/24/2015   Procedure: TRANSESOPHAGEAL ECHOCARDIOGRAM (TEE);  Surgeon: Rexene Alberts, MD;  Location: [place];  Service: Open Heart Surgery;  Laterality: N/A;  . TRANSTHORACIC ECHOCARDIOGRAM  10/09/2015   Mild LVH. EF 60-65%. Pseudo-normal grade 2 diastolic dysfunction. 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 TEE recommended.  Marland Kitchen  UMBILICAL HERNIA REPAIR N/A 02/01/2018   Procedure: OPEN UMBILICAL HERNIA REPAIR ERAS PATHWAY;  Surgeon: Ileana Roup, MD;  [facility];  Service: General;  Laterality: N/A;    There were no vitals filed for this visit.  Subjective Assessment - 12/12/18 0855    Subjective  Pt. reporting increased pain today which he attributes to not taking meds and poor weather.    Pertinent History  cerebellar CVA affecting vision    Diagnostic tests  Per pt, cervical x-ray  at MD office shows degenrative changes at C5-6; 11/12/18 - Head CT: No acute intracranial pathology.    Patient Stated Goals  "pain relief & ability to do more physical activities"    Currently in Pain?  Yes    Pain Score  7     Pain Location  Neck    Pain Orientation  Lower    Pain Descriptors / Indicators  Aching    Pain Type  Chronic pain    Pain Frequency  Intermittent    Aggravating Factors   poor weather    Multiple Pain Sites  Yes    Pain Score  7    Pain Location  Head    Pain Orientation  Anterior    Pain Descriptors / Indicators  Aching    Pain Type  Chronic pain    Pain Onset  More than a month ago    Pain Frequency  Intermittent    Aggravating Factors   poor weather         [facility] PT Assessment - 12/12/18 0852      Assessment   Medical Diagnosis  Neck pain secondary to cerivical DDD    Referring Provider (PT)  [name], MD    Next MD Visit  PRN      Observation/Other Assessments   Focus on Therapeutic Outcomes (FOTO)   Neck - 58% (42% limitation)      AROM   Cervical Flexion  62    Cervical Extension  58   pain   Cervical - Right Side Bend  34   pain & tightness   Cervical - Left Side Bend  30   pain & tightness   Cervical - Right Rotation  74   pain at end ROM   Cervical - Left Rotation  76   pain at end ROM     Strength   Right Shoulder Flexion  4+/5    Right Shoulder Extension  4+/5    Right Shoulder ABduction  4+/5    Right Shoulder Internal Rotation  4+/5    Right Shoulder External Rotation  4/5    Left Shoulder Flexion  4+/5    Left Shoulder Extension  4+/5    Left Shoulder ABduction  4+/5    Left Shoulder Internal Rotation  4+/5    Left Shoulder External Rotation  4/5                   [facility] Adult PT Treatment/Exercise - 12/12/18 0852      Neck Exercises: Machines for Strengthening   UBE (Upper Arm Bike)  L2.5 x 6 min (3' fwd/3' back)      Manual Therapy   Manual Therapy  Soft tissue mobilization;Myofascial  release;Passive ROM;Manual Traction    Manual therapy comments  prone & supine    Soft tissue mobilization  STM to B suboccipitals, cervical paraspinals, scalenes, UT & LS    Myofascial Release  B suboccipital release    Passive  ROM  cervical PROM all directions with gentle stretch at end ROM - well tolerated    Manual Traction  gentle manual distraction 5 x 30 sec        Trigger Point Dry Needling - 12/12/18 0852    Consent Given?  Yes    Muscles Treated Head and Neck  Suboccipitals;Oblique capitus;Splenius capitus;Semispinalis capitus   R>L   Oblique Capitus Response  Twitch response elicited;Palpable increased muscle length    Splenius capitus Response  Twitch reponse elicited;Palpable increased muscle length    Semispinalis capitus Response  Twitch reponse elicited;Palpable increased muscle length             PT Short Term Goals - 11/13/18 0926      PT SHORT TERM GOAL #1   Title  Patient will be independent with initial HEP    Status  Achieved   11/13/18     PT SHORT TERM GOAL #2   Title  Patient will verbalize/demonstrate understanding of neutral spine posture and proper body mechanics to reduce strain on cervical spine    Status  Achieved   11/13/18       PT Long Term Goals - 12/12/18 0906      PT LONG TERM GOAL #1   Title  Patient will be independent with ongoing/advanced HEP    Status  Partially Met      PT LONG TERM GOAL #2   Title  Patient to demonstrate appropriate posture and body mechanics needed for daily activities    Status  Achieved   12/12/18     PT LONG TERM GOAL #3   Title  Patient to improve cervical AROM to WNL/WFL without pain provocation    Status  Partially Met      PT LONG TERM GOAL #4   Title  Patient to report reduction in frequency and intensity of migraines by >/= 50%    Status  On-going   12/12/18 - Pt reporting 30% reduction     PT LONG TERM GOAL #5   Title  Patient to report ability to perform ADLs, household and  work-related tasks without increased pain    Status  Achieved   12/12/18           Plan - 12/12/18 [place] noting good progress with PT, with 30% reduction in frequency and intensity of headaches as well as decreased neck pain and improving cervical ROM. He still experiences some pain +/- tightness at end ROM for all motions except flexion but overall cervical ROM now essentially WFL/WNL. B shoulder strength now more symmetrical with overall strength grossly 4+/5 and no pain reported on MMT resistance. He reports increased pain today which he attributes to the inclement weather but states he intentionally did not take any pain meds as he wanted to see how effective the DN was on its own. Given very positive response to initial trial of DN with benefits last for a few days, again performed DN to upper cervical musculature in conjunction with manual STM and MFR - positive twitch responses elicited with palpable reduction in muscle tension. Patient again reporting resolution of headache as well as improved neck flexibility and ROM by end of session. All STGs now met with good progress demonstrated toward LTGs (#3 & 5 met, with remaining LTGs at least partially met). Given positive response to PT and some ongoing deficits, patient expressing desire to continue with PT, therefore will recommend recert reducing frequency to  1x/wk after next week to allow for further progression of HEP as well as manual therapy including DN as indicated.    Personal Factors and Comorbidities  Comorbidity 3+;Time since onset of injury/illness/exacerbation;Past/Current Experience;Profession    Comorbidities  OA; h/o cerebellar/ocualr CVA with residual visual deficits; AVR; CABG; DVT    Rehab Potential  Fair    PT Frequency  1x / week   starting week of 12/24/18   PT Duration  4 weeks    PT Treatment/Interventions  ADLs/Self Care Home Management;Cryotherapy;Electrical  Stimulation;Iontophoresis 57m/ml Dexamethasone;Moist Heat;Traction;Ultrasound;Functional mobility training;Therapeutic activities;Therapeutic exercise;Balance training;Neuromuscular re-education;Patient/family education;Manual techniques;Passive range of motion;Dry needling;Taping;Spinal Manipulations    PT Next Visit Plan  Postural stretching & strengthening; manual therapy & modalities PRN including traction as benefit noted    PT Home Exercise Plan  11/05/18 - Chin tuck, UT & LS stretches, 3-way doorway pec stretch, scap retraction; 11/13/18 - SCM stretch, cervical rotation SNAGs, hooklying scap retraction + horiz ABD & ER with red TB; 12/10/18 - prone I's & T's, prone cerv retraction    Consulted and Agree with Plan of Care  Patient       Patient will benefit from skilled therapeutic intervention in order to improve the following deficits and impairments:  Pain, Decreased activity tolerance, Decreased coordination, Decreased range of motion, Decreased strength, Hypomobility, Increased muscle spasms, Impaired perceived functional ability, Impaired UE functional use, Improper body mechanics, Postural dysfunction  Visit Diagnosis: Cervicalgia  Radiculopathy, cervical region  Chronic tension-type headache, intractable  Abnormal posture     Problem List Patient Active Problem List   Diagnosis Date Noted  . Encephalopathy 11/12/2018  . S/P aortic valve replacement with bioprosthetic valve 11/24/2015  . Diplopia   . 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 Acute cerebral infarction ([place] 11/18/2015  . Libman-Sacks endocarditis ([place]   . Antiphospholipid antibody syndrome ([place] 11/06/2015  . Thrombocytopenia ([place]   . 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 History of CVA (cerebrovascular accident) without residual deficits 10/19/2015  . Cerebellar infarct ([place] 10/06/2015  . 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

## 2018-12-13 ENCOUNTER — Encounter: Payer: BC Managed Care – PPO | Admitting: Physician Assistant

## 2018-12-13 NOTE — Progress Notes (Signed)
This encounter was created in error - please disregard.

## 2018-12-17 ENCOUNTER — Encounter: Payer: Self-pay | Admitting: Physical Therapy

## 2018-12-17 ENCOUNTER — Ambulatory Visit: Payer: BC Managed Care – PPO | Admitting: Physical Therapy

## 2018-12-17 ENCOUNTER — Other Ambulatory Visit: Payer: Self-pay

## 2018-12-17 DIAGNOSIS — M542 Cervicalgia: Secondary | ICD-10-CM | POA: Diagnosis not present

## 2018-12-17 DIAGNOSIS — M5412 Radiculopathy, cervical region: Secondary | ICD-10-CM | POA: Diagnosis not present

## 2018-12-17 DIAGNOSIS — G44221 Chronic tension-type headache, intractable: Secondary | ICD-10-CM

## 2018-12-17 DIAGNOSIS — R293 Abnormal posture: Secondary | ICD-10-CM | POA: Diagnosis not present

## 2018-12-17 NOTE — Therapy (Signed)
[facility] [address]  [place], [place], [address] Phone: [phone]   Fax:  [phone]  Physical Therapy Treatment  Patient Details  Name: Brian Lloyd MRN: [id number] Date of Birth: 1960/04/28 Referring Provider (PT): [name], MD   Encounter Date: 12/17/2018  PT End of Session - 12/17/18 0853    Visit Number  11    Number of Visits  16    Date for PT Re-Evaluation  01/16/19    Authorization Type  BCBS - VL: 30    PT Start Time  0846    PT Stop Time  0937    PT Time Calculation (min)  51 min    Activity Tolerance  Patient tolerated treatment well    Behavior During Therapy  [facility] for tasks assessed/performed       Past Medical History:  Diagnosis Date  . Acute cerebral infarction ([place]) 11/18/2015   Bifrontal embolic infarcts MRI/MRA 11/18/15; known aortic valve vegetation on lovenox/coumadin/ASA  . Antiphospholipid antibody syndrome ([place]) 11/06/2015   11/04/15 significant elevation of IgG anticardiolipin & beta-2-GP-1 antibodies  . 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 Aortic valve mass   . 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 Arthritis    hands  . Cerebellar infarct ([place]) 10/06/2015   Cardiac MRI showed acute to subacute lacunar infarct in the left cerebellum  . DVT (deep venous thrombosis) (HCC)    left leg, on Xarelto  . DVT, lower extremity, distal, chronic ([place]) 08/2015   Left calf DVT following injury  . Libman-Sacks endocarditis ([place])   . S/P aortic valve replacement with bioprosthetic valve 11/24/2015   25 mm [facility] Ease bovine pericardial tissue valve  . Thrombocytopenia ([place])     Past Surgical History:  Procedure Laterality Date  . AORTIC VALVE REPLACEMENT N/A 11/24/2015   Procedure: AORTIC VALVE REPLACEMENT;  Surgeon: Rexene Alberts, MD;  Location: [place];  Service: Open Heart Surgery;  Laterality: N/A;  . CARDIAC CATHETERIZATION N/A  10/29/2015   Procedure: Coronary/Graft Angiography;  Surgeon: Burnell Blanks, MD;  Location: [place] [facility];  Service: Cardiovascular;  Laterality: N/A;  . CORONARY ARTERY BYPASS GRAFT N/A 11/24/2015   Procedure: CORONARY ARTERY BYPASS GRAFTING (CABG)x 2 WITH ENDOSCOPIC HARVESTING OF RIGHT SAPHENOUS VEIN -SVG to LAD -SVG to OM1;  Surgeon: Rexene Alberts, MD;  Location: [place];  Service: Open Heart Surgery;  Laterality: N/A;  . EXCISION OF ATRIAL MYXOMA N/A 11/24/2015   Procedure: EXCISION OF AORTIC VALVE MASS ;  Surgeon: Rexene Alberts, MD;  Location: [place];  Service: Open Heart Surgery;  Laterality: N/A;  . HERNIA REPAIR Bilateral    inguinal  . MOUTH SURGERY     root canal  . TEE WITHOUT CARDIOVERSION N/A 10/22/2015   Procedure: TRANSESOPHAGEAL ECHOCARDIOGRAM (TEE);  Surgeon: Fay Records, MD;  [facility] ENDOSCOPY;  Service: Cardiovascular;  Laterality: N/A;  . TEE WITHOUT CARDIOVERSION N/A 11/24/2015   Procedure: TRANSESOPHAGEAL ECHOCARDIOGRAM (TEE);  Surgeon: Rexene Alberts, MD;  Location: [place];  Service: Open Heart Surgery;  Laterality: N/A;  . TRANSTHORACIC ECHOCARDIOGRAM  10/09/2015   Mild LVH. EF 60-65%. Pseudo-normal grade 2 diastolic dysfunction. 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 TEE recommended.  Marland Kitchen UMBILICAL HERNIA REPAIR N/A 02/01/2018   Procedure: OPEN UMBILICAL HERNIA REPAIR ERAS PATHWAY;  Surgeon: Ileana Roup, MD;  [facility];  Service:  General;  Laterality: N/A;    There were no vitals filed for this visit.  Subjective Assessment - 12/17/18 0851    Subjective  Pt. reporting no pain in his neck and shoulders, but experienced several headaches and is reporting numbness in down his R UE over the weekend.    Pertinent History  cerebellar CVA affecting vision    Diagnostic tests  Per pt, cervical x-ray at MD office shows degenrative changes at C5-6; 11/12/18 - Head CT: No acute intracranial pathology.     Patient Stated Goals  "pain relief & ability to do more physical activities"    Currently in Pain?  No/denies    Pain Radiating Towards  R UE down into fingertips                       [facility] Adult PT Treatment/Exercise - 12/17/18 0001      Neck Exercises: Machines for Strengthening   UBE (Upper Arm Bike)  L2.5 x 6 min (3' fwd/3' back)      Neck Exercises: Standing   Other Standing Exercises  door stretch x 3, wall angles x 10 reps, pt unable to keep his elbows on the wall past 90 degrees of shoulder abduction      Neck Exercises: Supine   Upper Extremity D1  10 reps;Theraband    Theraband Level (UE D1)  Level 2 (Red)    UE D1 Limitations   supine to keep spine in neutral position    Upper Extremity D2  10 reps;Theraband    Theraband Level (UE D2)  Level 2 (Red)    UE D2 Limitations  supine to keep in neutral position    Other Supine Exercise  chest press with 4# weghts in each hand x 30 reps    Other Supine Exercise  serratus punches with 4# weight x 10       Neck Exercises: Prone   Neck Retraction  10 reps;5 secs      Modalities   Modalities  Moist Heat      Moist Heat Therapy   Number Minutes Moist Heat  8 Minutes    Moist Heat Location  Cervical      Manual Therapy   Manual Therapy  Soft tissue mobilization;Myofascial release;Passive ROM;Manual Traction    Manual therapy comments  supine x 20 minutes    Soft tissue mobilization  STM to B suboccipitals, cervical paraspinals, scalenes, UT & LS    Myofascial Release  B suboccipital release    Passive ROM  cervical PROM all directions with gentle stretch at end ROM - well tolerated    Manual Traction  gentle manual distraction 5 x 30 sec                PT Short Term Goals - 11/13/18 0926      PT SHORT TERM GOAL #1   Title  Patient will be independent with initial HEP    Status  Achieved   11/13/18     PT SHORT TERM GOAL #2   Title  Patient will verbalize/demonstrate understanding of neutral  spine posture and proper body mechanics to reduce strain on cervical spine    Status  Achieved   11/13/18       PT Long Term Goals - 12/17/18 1430      PT LONG TERM GOAL #1   Title  Patient will be independent with ongoing/advanced HEP    Status  Partially Met  PT LONG TERM GOAL #2   Title  Patient to demonstrate appropriate posture and body mechanics needed for daily activities    Status  Achieved      PT LONG TERM GOAL #3   Title  Patient to improve cervical AROM to WNL/WFL without pain provocation    Status  Partially Met      PT LONG TERM GOAL #4   Title  Patient to report reduction in frequency and intensity of migraines by >/= 50%    Status  On-going      PT LONG TERM GOAL #5   Title  Patient to report ability to perform ADLs, household and work-related tasks without increased pain    Status  Achieved            Plan - 12/17/18 1426    Clinical Impression Statement  Pt reporting great progress since beginning therapy. Still concerned about headaches he experienced over the weekend. Pt reported that he was pleased with response to DN and felt the benefits the following day. Pt tolerating STM and occipital release to cervical spine. No new goals met this session. Pt with normal tolreance to heat at end of session. Continue with skilled PT with options of reducing frequency with next weekds visit.    Personal Factors and Comorbidities  Comorbidity 3+;Time since onset of injury/illness/exacerbation;Past/Current Experience;Profession    Comorbidities  OA; h/o cerebellar/ocualr CVA with residual visual deficits; AVR; CABG; DVT    Examination-Activity Limitations  Sleep;Lift    Examination-Participation Restrictions  Driving    Stability/Clinical Decision Making  Evolving/Moderate complexity    PT Frequency  1x / week    PT Duration  4 weeks    PT Treatment/Interventions  ADLs/Self Care Home Management;Cryotherapy;Electrical Stimulation;Iontophoresis 20m/ml  Dexamethasone;Moist Heat;Traction;Ultrasound;Functional mobility training;Therapeutic activities;Therapeutic exercise;Balance training;Neuromuscular re-education;Patient/family education;Manual techniques;Passive range of motion;Dry needling;Taping;Spinal Manipulations    PT Next Visit Plan  Postural stretching & strengthening; manual therapy & modalities PRN including traction as benefit noted    PT Home Exercise Plan  11/05/18 - Chin tuck, UT & LS stretches, 3-way doorway pec stretch, scap retraction; 11/13/18 - SCM stretch, cervical rotation SNAGs, hooklying scap retraction + horiz ABD & ER with red TB; 12/10/18 - prone I's & T's, prone cerv retraction    Consulted and Agree with Plan of Care  Patient       Patient will benefit from skilled therapeutic intervention in order to improve the following deficits and impairments:  Pain, Decreased activity tolerance, Decreased coordination, Decreased range of motion, Decreased strength, Hypomobility, Increased muscle spasms, Impaired perceived functional ability, Impaired UE functional use, Improper body mechanics, Postural dysfunction  Visit Diagnosis: Cervicalgia  Radiculopathy, cervical region  Chronic tension-type headache, intractable  Abnormal posture     Problem List Patient Active Problem List   Diagnosis Date Noted  . Encephalopathy 11/12/2018  . S/P aortic valve replacement with bioprosthetic valve 11/24/2015  . Diplopia   . 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 Acute cerebral infarction ([place] 11/18/2015  . Libman-Sacks endocarditis (HN[place]   . Antiphospholipid antibody syndrome (HT[place] 11/06/2015  . Thrombocytopenia ([facility]   . 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 History of CVA (cerebrovascular accident) without residual deficits 10/19/2015  . Cerebellar infarct ([facility] 10/06/2015  . 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

## 2018-12-19 ENCOUNTER — Ambulatory Visit: Payer: BC Managed Care – PPO | Admitting: Physical Therapy

## 2018-12-19 ENCOUNTER — Encounter: Payer: Self-pay | Admitting: Physical Therapy

## 2018-12-19 ENCOUNTER — Other Ambulatory Visit: Payer: Self-pay

## 2018-12-19 DIAGNOSIS — G44221 Chronic tension-type headache, intractable: Secondary | ICD-10-CM | POA: Diagnosis not present

## 2018-12-19 DIAGNOSIS — M542 Cervicalgia: Secondary | ICD-10-CM

## 2018-12-19 DIAGNOSIS — R293 Abnormal posture: Secondary | ICD-10-CM

## 2018-12-19 DIAGNOSIS — M5412 Radiculopathy, cervical region: Secondary | ICD-10-CM | POA: Diagnosis not present

## 2018-12-19 NOTE — Therapy (Signed)
[facility] [place] [facility] [address]  [place], [place], [address] Phone: [phone]   Fax:  [phone]  Physical Therapy Treatment  Patient Details  Name: Cal Gindlesperger MRN: [id number] Date of Birth: 11-Jul-1960 Referring Provider (PT): [name], MD   Encounter Date: 12/19/2018  PT End of Session - 12/19/18 0850    Visit Number  12    Number of Visits  16    Date for PT Re-Evaluation  01/16/19    Authorization Type  BCBS - VL: 30    PT Start Time  0850    PT Stop Time  0929    PT Time Calculation (min)  39 min    Activity Tolerance  Patient tolerated treatment well    Behavior During Therapy  [facility] for tasks assessed/performed       Past Medical History:  Diagnosis Date  . Acute cerebral infarction ([place]) 11/18/2015   Bifrontal embolic infarcts MRI/MRA 11/18/15; known aortic valve vegetation on lovenox/coumadin/ASA  . Antiphospholipid antibody syndrome ([place]) 11/06/2015   11/04/15 significant elevation of IgG anticardiolipin & beta-2-GP-1 antibodies  . 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 Aortic valve mass   . 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 Arthritis    hands  . Cerebellar infarct ([place]) 10/06/2015   Cardiac MRI showed acute to subacute lacunar infarct in the left cerebellum  . DVT (deep venous thrombosis) (HCC)    left leg, on Xarelto  . DVT, lower extremity, distal, chronic ([place]) 08/2015   Left calf DVT following injury  . Libman-Sacks endocarditis ([place])   . S/P aortic valve replacement with bioprosthetic valve 11/24/2015   25 mm [facility] Ease bovine pericardial tissue valve  . Thrombocytopenia ([place])     Past Surgical History:  Procedure Laterality Date  . AORTIC VALVE REPLACEMENT N/A 11/24/2015   Procedure: AORTIC VALVE REPLACEMENT;  Surgeon: Rexene Alberts, MD;  Location: [place];  Service: Open Heart Surgery;  Laterality: N/A;  . CARDIAC CATHETERIZATION N/A  10/29/2015   Procedure: Coronary/Graft Angiography;  Surgeon: Burnell Blanks, MD;  Location: [place] CV LAB;  Service: Cardiovascular;  Laterality: N/A;  . CORONARY ARTERY BYPASS GRAFT N/A 11/24/2015   Procedure: CORONARY ARTERY BYPASS GRAFTING (CABG)x 2 WITH ENDOSCOPIC HARVESTING OF RIGHT SAPHENOUS VEIN -SVG to LAD -SVG to OM1;  Surgeon: Rexene Alberts, MD;  Location: [place];  Service: Open Heart Surgery;  Laterality: N/A;  . EXCISION OF ATRIAL MYXOMA N/A 11/24/2015   Procedure: EXCISION OF AORTIC VALVE MASS ;  Surgeon: Rexene Alberts, MD;  Location: [place];  Service: Open Heart Surgery;  Laterality: N/A;  . HERNIA REPAIR Bilateral    inguinal  . MOUTH SURGERY     root canal  . TEE WITHOUT CARDIOVERSION N/A 10/22/2015   Procedure: TRANSESOPHAGEAL ECHOCARDIOGRAM (TEE);  Surgeon: Fay Records, MD;  [facility] ENDOSCOPY;  Service: Cardiovascular;  Laterality: N/A;  . TEE WITHOUT CARDIOVERSION N/A 11/24/2015   Procedure: TRANSESOPHAGEAL ECHOCARDIOGRAM (TEE);  Surgeon: Rexene Alberts, MD;  Location: [place];  Service: Open Heart Surgery;  Laterality: N/A;  . TRANSTHORACIC ECHOCARDIOGRAM  10/09/2015   Mild LVH. EF 60-65%. Pseudo-normal grade 2 diastolic dysfunction. 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 TEE recommended.  Marland Kitchen UMBILICAL HERNIA REPAIR N/A 02/01/2018   Procedure: OPEN UMBILICAL HERNIA REPAIR ERAS PATHWAY;  Surgeon: Ileana Roup, MD;  Location: [facility];  Service:  General;  Laterality: N/A;    There were no vitals filed for this visit.  Subjective Assessment - 12/19/18 0854    Subjective  Pt reporting the therapist on Monday fixed my shoulder. No pain today.    Pertinent History  cerebellar CVA affecting vision    Diagnostic tests  Per pt, cervical x-ray at MD office shows degenrative changes at C5-6; 11/12/18 - Head CT: No acute intracranial pathology.    Patient Stated Goals  "pain relief & ability to do more physical  activities"    Currently in Pain?  No/denies                       [facility] Adult PT Treatment/Exercise - 12/19/18 0850      Exercises   Exercises  Neck      Neck Exercises: Machines for Strengthening   UBE (Upper Arm Bike)  L2.5 x 6 min (3' fwd/3' back)      Neck Exercises: Standing   Wall Push Ups  10 reps   2 sets   Upper Extremity D1  10 reps;Flexion;Extension;Theraband    Theraband Level (UE D1)  Level 2 (Red)    Upper Extremity D2  10 reps;Flexion;Extension;Theraband    Theraband Level (UE D2)  Level 2 (Red)      Manual Therapy   Manual Therapy  Soft tissue mobilization;Myofascial release    Manual therapy comments  supine    Soft tissue mobilization  STM to R anterior deltoid    Myofascial Release  manual TPR & MFR to R anterior deltoid       Trigger Point Dry Needling - 12/19/18 0850    Consent Given?  Yes    Muscles Treated Upper Quadrant  Deltoid   Right   Deltoid Response  Twitch response elicited;Palpable increased muscle length           PT Education - 12/19/18 0929    Education Details  HEP update - red TB UE diagonals, wall pushups    Person(s) Educated  Patient    Methods  Explanation;Demonstration;Handout    Comprehension  Verbalized understanding;Returned demonstration;Need further instruction       PT Short Term Goals - 11/13/18 0926      PT SHORT TERM GOAL #1   Title  Patient will be independent with initial HEP    Status  Achieved   11/13/18     PT SHORT TERM GOAL #2   Title  Patient will verbalize/demonstrate understanding of neutral spine posture and proper body mechanics to reduce strain on cervical spine    Status  Achieved   11/13/18       PT Long Term Goals - 12/17/18 1430      PT LONG TERM GOAL #1   Title  Patient will be independent with ongoing/advanced HEP    Status  Partially Met      PT LONG TERM GOAL #2   Title  Patient to demonstrate appropriate posture and body mechanics needed for daily activities     Status  Achieved      PT LONG TERM GOAL #3   Title  Patient to improve cervical AROM to WNL/WFL without pain provocation    Status  Partially Met      PT LONG TERM GOAL #4   Title  Patient to report reduction in frequency and intensity of migraines by >/= 50%    Status  On-going      PT LONG TERM GOAL #5  Title  Patient to report ability to perform ADLs, household and work-related tasks without increased pain    Status  Achieved            Plan - 12/19/18 0856    Clinical Impression Statement  Caleb reporting good relief of R shoulder pain following manual PT last visit but noting some pain during progression of UE diagonals to upright position today. Addressed area of increased muscle tension and ttp in R anterior deltoid today with further manual therapy including DN with improved exercise tolerance following this. Overall he is very pleased with his progress with PT, noting near resolution of neck and shoulder pain and reduction in frequency and intensity of headaches. He reports he is scheduled for an upcoming appointment with Andrey Spearman, MD on 01/08/19 where he hopes to further address the headaches with the MD, and reports he may also f/u with his ophthalmologist regarding his vision to determine if this is also impacting his headaches. Given current progress will proceed with reduction in frequency to 1x/wk for up to 4 weeks as indicated for further progression of HEP as well as manual therapy including DN as indicated.    Personal Factors and Comorbidities  Comorbidity 3+;Time since onset of injury/illness/exacerbation;Past/Current Experience;Profession    Comorbidities  OA; h/o cerebellar/ocualr CVA with residual visual deficits; AVR; CABG; DVT    Examination-Activity Limitations  --    Examination-Participation Restrictions  --    Stability/Clinical Decision Making  Evolving/Moderate complexity    PT Frequency  1x / week    PT Duration  4 weeks    PT  Treatment/Interventions  ADLs/Self Care Home Management;Cryotherapy;Electrical Stimulation;Iontophoresis 36m/ml Dexamethasone;Moist Heat;Traction;Ultrasound;Functional mobility training;Therapeutic activities;Therapeutic exercise;Balance training;Neuromuscular re-education;Patient/family education;Manual techniques;Passive range of motion;Dry needling;Taping;Spinal Manipulations    PT Next Visit Plan  Postural stretching & strengthening; manual therapy & modalities PRN including traction as benefit noted    PT Home Exercise Plan  11/05/18 - Chin tuck, UT & LS stretches, 3-way doorway pec stretch, scap retraction; 11/13/18 - SCM stretch, cervical rotation SNAGs, hooklying scap retraction + horiz ABD & ER with red TB; 12/10/18 - prone I's & T's, prone cerv retraction; 12/19/18 - standing red TB UE diagonals, wall pushups    Consulted and Agree with Plan of Care  Patient       Patient will benefit from skilled therapeutic intervention in order to improve the following deficits and impairments:  Pain, Decreased activity tolerance, Decreased coordination, Decreased range of motion, Decreased strength, Hypomobility, Increased muscle spasms, Impaired perceived functional ability, Impaired UE functional use, Improper body mechanics, Postural dysfunction  Visit Diagnosis: Cervicalgia  Radiculopathy, cervical region  Chronic tension-type headache, intractable  Abnormal posture     Problem List Patient Active Problem List   Diagnosis Date Noted  . Encephalopathy 11/12/2018  . S/P aortic valve replacement with bioprosthetic valve 11/24/2015  . Diplopia   . 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 Acute cerebral infarction ([place] 11/18/2015  . Libman-Sacks endocarditis ([place]   . Antiphospholipid antibody syndrome ([place] 11/06/2015  . Thrombocytopenia ([place]   . 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 History of CVA (cerebrovascular accident) without residual  deficits 10/19/2015  . Cerebellar infarct ([place] 10/06/2015  . 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 [place] [address]  Phone: [phone]   Fax:  [phone]  Name: Sigfredo Schreier MRN: [id number] Date of Birth: 1960-04-18

## 2018-12-19 NOTE — Patient Instructions (Signed)
    Home exercise program created by Mairead Schwarzkopf, PT.  For questions, please contact Aadvik Roker via phone at [phone] or email at [email or web address]  [facility] [address]  [address] [place], [place], [address] Phone: [phone]   Fax:  [phone]    

## 2018-12-24 ENCOUNTER — Telehealth: Payer: Self-pay | Admitting: *Deleted

## 2018-12-24 ENCOUNTER — Telehealth: Payer: Self-pay | Admitting: Pharmacist

## 2018-12-24 NOTE — Telephone Encounter (Signed)
Results of PST, FS, POC INR made available to me today = 3.2 on 37.5mg  warfarin/wk as 5mg  QD, EXCEPT on Wednesdays, take 7.5mg  (1 and 1/2 x 5mg ). Repeat PST FS POC INR on 07-Jan-2019.

## 2018-12-24 NOTE — Telephone Encounter (Signed)
Patient has been contacted and advised to remain on same regimen:  1 and 1/2 x 5mg  every Wednesday; 1x5mg  all other days. Repeat patient self testing, point of care finger-stick INR on 07-Jan-2019 and provide results to me.

## 2018-12-24 NOTE — Telephone Encounter (Signed)
Received fax from [facility] with INR results from 09/05/2018 to 12/21/2018. Last INR 3.2 on 12/21/2018. Results placed in Dr. Gladstone Pih box. Hubbard Hartshorn, RN, BSN

## 2018-12-26 ENCOUNTER — Ambulatory Visit: Payer: BC Managed Care – PPO | Admitting: Physical Therapy

## 2018-12-26 ENCOUNTER — Encounter: Payer: Self-pay | Admitting: Physical Therapy

## 2018-12-26 ENCOUNTER — Other Ambulatory Visit: Payer: Self-pay

## 2018-12-26 DIAGNOSIS — M542 Cervicalgia: Secondary | ICD-10-CM

## 2018-12-26 DIAGNOSIS — R293 Abnormal posture: Secondary | ICD-10-CM

## 2018-12-26 DIAGNOSIS — G44221 Chronic tension-type headache, intractable: Secondary | ICD-10-CM | POA: Diagnosis not present

## 2018-12-26 DIAGNOSIS — M5412 Radiculopathy, cervical region: Secondary | ICD-10-CM | POA: Diagnosis not present

## 2018-12-26 NOTE — Therapy (Addendum)
[facility] [address]  [place], [place], [address] Phone: [phone]   Fax:  [phone]  Physical Therapy Treatment / Discharge Summary  Patient Details  Name: Brian Lloyd MRN: [id number] Date of Birth: 1960-03-28 Referring Provider (PT): [name], MD   Encounter Date: 12/26/2018  PT End of Session - 12/26/18 0845    Visit Number  13    Number of Visits  16    Date for PT Re-Evaluation  01/16/19    Authorization Type  BCBS - VL: 30    PT Start Time  0845    PT Stop Time  0925    PT Time Calculation (min)  40 min    Activity Tolerance  Patient tolerated treatment well    Behavior During Therapy  [facility] for tasks assessed/performed       Past Medical History:  Diagnosis Date  . Acute cerebral infarction ([place]) 11/18/2015   Bifrontal embolic infarcts MRI/MRA 11/18/15; known aortic valve vegetation on lovenox/coumadin/ASA  . Antiphospholipid antibody syndrome ([place]) 11/06/2015   11/04/15 significant elevation of IgG anticardiolipin & beta-2-GP-1 antibodies  . 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 Aortic valve mass   . 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 Arthritis    hands  . Cerebellar infarct ([place]) 10/06/2015   Cardiac MRI showed acute to subacute lacunar infarct in the left cerebellum  . DVT (deep venous thrombosis) (HCC)    left leg, on Xarelto  . DVT, lower extremity, distal, chronic ([place]) 08/2015   Left calf DVT following injury  . 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 S/P aortic valve replacement with bioprosthetic valve 11/24/2015   25 mm [facility] Ease bovine pericardial tissue valve  . Thrombocytopenia ([place])     Past Surgical History:  Procedure Laterality Date  . AORTIC VALVE REPLACEMENT N/A 11/24/2015   Procedure: AORTIC VALVE REPLACEMENT;  Surgeon: Rexene Alberts, MD;  Location: [place];  Service: Open Heart Surgery;  Laterality: N/A;  . CARDIAC  CATHETERIZATION N/A 10/29/2015   Procedure: Coronary/Graft Angiography;  Surgeon: Burnell Blanks, MD;  Location: [place] CV LAB;  Service: Cardiovascular;  Laterality: N/A;  . CORONARY ARTERY BYPASS GRAFT N/A 11/24/2015   Procedure: CORONARY ARTERY BYPASS GRAFTING (CABG)x 2 WITH ENDOSCOPIC HARVESTING OF RIGHT SAPHENOUS VEIN -SVG to LAD -SVG to OM1;  Surgeon: Rexene Alberts, MD;  Location: [place];  Service: Open Heart Surgery;  Laterality: N/A;  . EXCISION OF ATRIAL MYXOMA N/A 11/24/2015   Procedure: EXCISION OF AORTIC VALVE MASS ;  Surgeon: Rexene Alberts, MD;  Location: [place];  Service: Open Heart Surgery;  Laterality: N/A;  . HERNIA REPAIR Bilateral    inguinal  . MOUTH SURGERY     root canal  . TEE WITHOUT CARDIOVERSION N/A 10/22/2015   Procedure: TRANSESOPHAGEAL ECHOCARDIOGRAM (TEE);  Surgeon: Fay Records, MD;  Location: [facility] ENDOSCOPY;  Service: Cardiovascular;  Laterality: N/A;  . TEE WITHOUT CARDIOVERSION N/A 11/24/2015   Procedure: TRANSESOPHAGEAL ECHOCARDIOGRAM (TEE);  Surgeon: Rexene Alberts, MD;  Location: [facility];  Service: Open Heart Surgery;  Laterality: N/A;  . TRANSTHORACIC ECHOCARDIOGRAM  10/09/2015   Mild LVH. EF 60-65%. Pseudo-normal grade 2 diastolic dysfunction. 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 TEE recommended.  Marland Kitchen UMBILICAL HERNIA REPAIR N/A 02/01/2018   Procedure: OPEN UMBILICAL HERNIA REPAIR ERAS PATHWAY;  Surgeon: Ileana Roup, MD;  [facility]: [facility];  Service: General;  Laterality: N/A;    There were no vitals filed for this visit.  Subjective Assessment - 12/26/18 0848    Subjective  Pt reporting PT has really helped his neck, back and shoulder. Still bothered by headaches and will be neurologist on 12/8. Also considering following up with eye MD.    Pertinent History  cerebellar CVA affecting vision    Diagnostic tests  Per pt, cervical x-ray at MD office shows degenrative changes at C5-6; 11/12/18 -  Head CT: No acute intracranial pathology.    Patient Stated Goals  "pain relief & ability to do more physical activities"    Currently in Pain?  Yes    Pain Score  3    3-4/10   Pain Location  Neck    Pain Orientation  Lower    Pain Descriptors / Indicators  Dull;Sharp    Pain Type  Chronic pain    Pain Radiating Towards  none    Pain Frequency  Intermittent    Pain Score  4   3-4/10   Pain Location  Head    Pain Orientation  Anterior    Pain Descriptors / Indicators  Headache    Pain Score  5   4-5/10   Pain Location  Shoulder    Pain Orientation  Left;Anterior    Pain Descriptors / Indicators  Stabbing    Pain Type  Acute pain    Pain Frequency  Intermittent                       [facility] Adult PT Treatment/Exercise - 12/26/18 0845      Exercises   Exercises  Neck      Neck Exercises: Machines for Strengthening   UBE (Upper Arm Bike)  L2.5 x 6 min (3' fwd/3' back)      Manual Therapy   Manual Therapy  Soft tissue mobilization;Myofascial release    Manual therapy comments  prone    Soft tissue mobilization  STM to B cervical paraspinals, UT/LS and L anterior deltoid    Myofascial Release  manual TPR & MFR B UT & LS, L anterior deltoid       Trigger Point Dry Needling - 12/26/18 0845    Muscles Treated Head and Neck  Upper trapezius;Levator scapulae    Upper Trapezius Response  Twitch reponse elicited;Palpable increased muscle length   Right   Levator Scapulae Response  Twitch response elicited;Palpable increased muscle length   Bilateral            PT Short Term Goals - 11/13/18 0926      PT SHORT TERM GOAL #1   Title  Patient will be independent with initial HEP    Status  Achieved   11/13/18     PT SHORT TERM GOAL #2   Title  Patient will verbalize/demonstrate understanding of neutral spine posture and proper body mechanics to reduce strain on cervical spine    Status  Achieved   11/13/18       PT Long Term Goals - 12/17/18 1430       PT LONG TERM GOAL #1   Title  Patient will be independent with ongoing/advanced HEP    Status  Partially Met      PT LONG TERM GOAL #2   Title  Patient to demonstrate appropriate posture and body mechanics needed for daily activities    Status  Achieved      PT LONG  TERM GOAL #3   Title  Patient to improve cervical AROM to WNL/WFL without pain provocation    Status  Partially Met      PT LONG TERM GOAL #4   Title  Patient to report reduction in frequency and intensity of migraines by >/= 50%    Status  On-going      PT LONG TERM GOAL #5   Title  Patient to report ability to perform ADLs, household and work-related tasks without increased pain    Status  Achieved            Plan - 12/26/18 0927    Clinical Impression Statement  Brian Lloyd reporting good overall relief of neck, back and R shoulder pain with PT thus far with no recent UE radicular symptoms noted. He does state today that his L shoulder is flared up after prolonged projects working on his property. Increased muscle tension noted in B UT and LS as well as increased muscle tension and ttp in L anterior deltoid. Addressed increase muscle tension and TPs/taut bands with further manual therapy including DN to B LS and R UT with patient reporting good relief of pain. He denies need for further review of HEP currently, therefore remainder of session spent briefly reviewing posture and body mechanics education as well as need to avoid overdoing activities which exacerbate pain by taking rest/stretch breaks and breaking projects into smaller tasks over extended time frame.    Personal Factors and Comorbidities  Comorbidity 3+;Time since onset of injury/illness/exacerbation;Past/Current Experience;Profession    Comorbidities  OA; h/o cerebellar/ocualr CVA with residual visual deficits; AVR; CABG; DVT    Stability/Clinical Decision Making  Evolving/Moderate complexity    PT Frequency  1x / week    PT Duration  4 weeks    PT  Treatment/Interventions  ADLs/Self Care Home Management;Cryotherapy;Electrical Stimulation;Iontophoresis 24m/ml Dexamethasone;Moist Heat;Traction;Ultrasound;Functional mobility training;Therapeutic activities;Therapeutic exercise;Balance training;Neuromuscular re-education;Patient/family education;Manual techniques;Passive range of motion;Dry needling;Taping;Spinal Manipulations    PT Next Visit Plan  Postural stretching & strengthening; manual therapy & modalities PRN including traction as benefit noted    PT Home Exercise Plan  11/05/18 - Chin tuck, UT & LS stretches, 3-way doorway pec stretch, scap retraction; 11/13/18 - SCM stretch, cervical rotation SNAGs, hooklying scap retraction + horiz ABD & ER with red TB; 12/10/18 - prone I's & T's, prone cerv retraction; 12/19/18 - standing red TB UE diagonals, wall pushups    Consulted and Agree with Plan of Care  Patient       Patient will benefit from skilled therapeutic intervention in order to improve the following deficits and impairments:  Pain, Decreased activity tolerance, Decreased coordination, Decreased range of motion, Decreased strength, Hypomobility, Increased muscle spasms, Impaired perceived functional ability, Impaired UE functional use, Improper body mechanics, Postural dysfunction  Visit Diagnosis: Cervicalgia  Radiculopathy, cervical region  Chronic tension-type headache, intractable  Abnormal posture     Problem List Patient Active Problem List   Diagnosis Date Noted  . Encephalopathy 11/12/2018  . S/P aortic valve replacement with bioprosthetic valve 11/24/2015  . Diplopia   . 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 Acute cerebral infarction ([place] 11/18/2015  . Libman-Sacks endocarditis ([place]   . Antiphospholipid antibody syndrome ([place] 11/06/2015  . Thrombocytopenia ([place]   . 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 History of CVA (cerebrovascular accident) without residual  deficits 10/19/2015  . Cerebellar infarct ([place] 10/06/2015  . Chronic deep vein thrombosis (DVT) of distal vein of left lower  extremity ([place]) 08/01/2015    Percival Spanish, PT, MPT 12/26/2018, 11:00 AM  [facility] [address]  [place], [place], [address] Phone: [phone]   Fax:  [phone]  Name: Brian Lloyd MRN: [id number] Date of Birth: 08-06-1960   PHYSICAL THERAPY DISCHARGE SUMMARY  Visits from Start of Care: 13  Current functional level related to goals / functional outcomes:   Refer to above clinical impression for status as of last visit on 12/26/2018. Patient cancelled remaining scheduled appointments in December as of 01/08/2019 due to a potential COVID-19 exposure and has not scheduled any further visits in >30 days, therefore will proceed with discharge from PT for this episode.   Remaining deficits:   As above.   Education / Equipment:   HEP  Plan: Patient agrees to discharge.  Patient goals were partially met. Patient is being discharged due to not returning since the last visit.  ?????     Percival Spanish, PT, MPT 01/31/19, 9:59 AM  [facility] [address]  [place], [place], [address] Phone: [phone]   Fax:  [phone]

## 2019-01-02 ENCOUNTER — Ambulatory Visit: Payer: BC Managed Care – PPO | Admitting: Physical Therapy

## 2019-01-02 DIAGNOSIS — M47812 Spondylosis without myelopathy or radiculopathy, cervical region: Secondary | ICD-10-CM | POA: Diagnosis not present

## 2019-01-08 ENCOUNTER — Other Ambulatory Visit: Payer: Self-pay

## 2019-01-08 ENCOUNTER — Encounter: Payer: Self-pay | Admitting: Diagnostic Neuroimaging

## 2019-01-08 ENCOUNTER — Ambulatory Visit (INDEPENDENT_AMBULATORY_CARE_PROVIDER_SITE_OTHER): Payer: BC Managed Care – PPO | Admitting: Diagnostic Neuroimaging

## 2019-01-08 VITALS — BP 127/89 | HR 74 | Temp 97.6°F | Ht 68.0 in | Wt 189.0 lb

## 2019-01-08 DIAGNOSIS — G43109 Migraine with aura, not intractable, without status migrainosus: Secondary | ICD-10-CM | POA: Diagnosis not present

## 2019-01-08 DIAGNOSIS — R413 Other amnesia: Secondary | ICD-10-CM | POA: Diagnosis not present

## 2019-01-08 MED ORDER — AMITRIPTYLINE HCL 25 MG PO TABS: 25.0000 mg | ORAL_TABLET | Freq: Every day | ORAL | 6 refills | Status: DC

## 2019-01-08 NOTE — Progress Notes (Signed)
[facility]  PATIENT: Brian Lloyd DOB: Nov 19, 1960  REFERRING CLINICIAN: Deirdre Pippins, MD HISTORY FROM: patient  REASON FOR VISIT: new consult    HISTORICAL  CHIEF COMPLAINT:  Chief Complaint  Patient presents with  . Referral    Referral for confusion and headache wakes up with a headache and takes tylenol and takes norco at night, he states it radiates to his neck area pt alone he is currently in physical therapy    HISTORY OF PRESENT ILLNESS:   58 year old male here for evaluation of memory loss and headaches.  History of primary antiphospholipid antibody syndrome, DVT, endocarditis, aortic valve replacement, embolic strokes in 1062.  Patient has history of headaches starting in 2015 with global pain, neck pain, nausea, photophobia and phonophobia.  Patient took Tylenol as needed for these headaches but did not have a specific diagnosis.   Patient then developed double vision and dizziness in September 2017, had MRI of the brain which demonstrated acute ischemic infarction in the left cerebellum.  Ultimately he was diagnosed with antiphospholipid antibody syndrome, aortic valve mass/vegetation, and underwent aortic valve replacement.  Since that time headaches have continued.  In 2018 patient followed up in outpatient neurology clinic, was diagnosed with migraine with aura in addition to his strokes, and was recommended to start on migraine treatments.  However patient did not start these treatments.  For past 1 year patient has a daily low-grade headache.  It also intermittently fluctuates and increases intensity.  He also has intermittent migraine headaches (2-3 per week) with throbbing sensation, right-sided pain, visual distortion and disturbances, seeing geometric shapes, shining lights, nausea, photophobia, phonophobia, neck pain.Marland Kitchen He takes daily Tylenol and occasional hydrocodone.  Patient also reports some generalized memory loss, forgetfulness,  decreased attention.  He reports getting at least 7 to 8 hours of sleep per night.  No snoring or daytime sleepiness.  He has some stress related to work, headaches, his medical conditions.    REVIEW OF SYSTEMS: Full 14 system review of systems performed and negative with exception of: As per HPI.  ALLERGIES: Allergies  Allergen Reactions  . Naproxen Anaphylaxis    HOME MEDICATIONS: Outpatient Medications Prior to Visit  Medication Sig Dispense Refill  . acetaminophen (TYLENOL) 500 MG tablet Take 1,000 mg by mouth every 6 (six) hours as needed for moderate pain or headache.     Marland Kitchen aspirin EC 81 MG tablet Take 81 mg by mouth daily.    Marland Kitchen HYDROcodone-acetaminophen (NORCO) 7.5-325 MG tablet TALE 1 TABLET AS NEEDED TWICE A DAY FOR SEVERE PAIN (MAY FILL 11/01/2018) 30 DAYS    . Multiple Vitamins-Minerals (MULTIVITAMIN PO) Take 1 tablet by mouth daily.    . psyllium (METAMUCIL) 58.6 % packet Take 1 packet by mouth daily.    Marland Kitchen warfarin (COUMADIN) 5 MG tablet Take 1 and 1/2 tablet by mouth daily, EXCEPT on Mondays, Wednesdays and Fridays--take only one (1) tablet. 36 tablet 1   No facility-administered medications prior to visit.     PAST MEDICAL HISTORY: Past Medical History:  Diagnosis Date  . Acute cerebral infarction ([place]) 11/18/2015   Bifrontal embolic infarcts MRI/MRA 11/18/15; known aortic valve vegetation on lovenox/coumadin/ASA  . Antiphospholipid antibody syndrome ([facility] [place] [facility]) 11/06/2015   11/04/15 significant elevation of IgG anticardiolipin & beta-2-GP-1 antibodies  . 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 Aortic valve mass   . 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 Arthritis    hands  . Cerebellar infarct ([place]) 10/06/2015  Cardiac MRI showed acute to subacute lacunar infarct in the left cerebellum  . DVT (deep venous thrombosis) ([facility])    left leg, on Xarelto  . 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 S/P aortic valve replacement with bioprosthetic valve 11/24/2015   25 mm [facility] Ease bovine pericardial tissue valve  . Thrombocytopenia ([facility])     PAST SURGICAL HISTORY: Past Surgical History:  Procedure Laterality Date  . AORTIC VALVE REPLACEMENT N/A 11/24/2015   Procedure: AORTIC VALVE REPLACEMENT;  Surgeon: Purcell Nails, MD;  Location: [facility] OR;  Service: Open Heart Surgery;  Laterality: N/A;  . CARDIAC CATHETERIZATION N/A 10/29/2015   Procedure: Coronary/Graft Angiography;  Surgeon: Kathleene Hazel, MD;  Location: [facility] INVASIVE CV LAB;  Service: Cardiovascular;  Laterality: N/A;  . CORONARY ARTERY BYPASS GRAFT N/A 11/24/2015   Procedure: CORONARY ARTERY BYPASS GRAFTING (CABG)x 2 WITH ENDOSCOPIC HARVESTING OF RIGHT SAPHENOUS VEIN -SVG to LAD -SVG to OM1;  Surgeon: Purcell Nails, MD;  Location: [facility] OR;  Service: Open Heart Surgery;  Laterality: N/A;  . EXCISION OF ATRIAL MYXOMA N/A 11/24/2015   Procedure: EXCISION OF AORTIC VALVE MASS ;  Surgeon: Purcell Nails, MD;  Location: [facility] OR;  Service: Open Heart Surgery;  Laterality: N/A;  . HERNIA REPAIR Bilateral    inguinal  . MOUTH SURGERY     root canal  . TEE WITHOUT CARDIOVERSION N/A 10/22/2015   Procedure: TRANSESOPHAGEAL ECHOCARDIOGRAM (TEE);  Surgeon: Pricilla Riffle, MD;  Location: [facility] ENDOSCOPY;  Service: Cardiovascular;  Laterality: N/A;  . TEE WITHOUT CARDIOVERSION N/A 11/24/2015   Procedure: TRANSESOPHAGEAL ECHOCARDIOGRAM (TEE);  Surgeon: Purcell Nails, MD;  Location: [facility] OR;  Service: Open Heart Surgery;  Laterality: N/A;  . TRANSTHORACIC ECHOCARDIOGRAM  10/09/2015   Mild LVH. EF 60-65%. Pseudo-normal grade 2 diastolic dysfunction. 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 TEE recommended.  Marland Kitchen UMBILICAL HERNIA REPAIR N/A 02/01/2018   Procedure: OPEN UMBILICAL HERNIA REPAIR ERAS PATHWAY;  Surgeon: Andria Meuse, MD;  Location: [facility] ORS;   Service: General;  Laterality: N/A;    FAMILY HISTORY: Family History  Problem Relation Age of Onset  . Heart disease Father   . Hypertension Father   . Heart disease Maternal Grandfather   . Hypertension Maternal Grandfather   . Diabetes Paternal Grandfather   . Heart disease Paternal Grandfather     SOCIAL HISTORY: Social History   Socioeconomic History  . Marital status: Married    Spouse name: Not on file  . Number of children: Not on file  . Years of education: Not on file  . Highest education level: Not on file  Occupational History  . Not on file  Social Needs  . Financial resource strain: Not on file  . Food insecurity    Worry: Not on file    Inability: Not on file  . Transportation needs    Medical: Not on file    Non-medical: Not on file  Tobacco Use  . Smoking status: Never Smoker  . Smokeless tobacco: Never Used  Substance and Sexual Activity  . Alcohol use: Not Currently    Alcohol/week: 3.0 standard drinks    Types: 1 Glasses of wine, 1 Cans of beer, 1 Shots of liquor per week  . Drug use: No  . Sexual activity: Yes  Lifestyle  . Physical activity    Days  per week: Not on file    Minutes per session: Not on file  . Stress: Not on file  Relationships  . Social Musician on phone: Not on file    Gets together: Not on file    Attends religious service: Not on file    Active member of club or organization: Not on file    Attends meetings of clubs or organizations: Not on file    Relationship status: Not on file  . Intimate partner violence    Fear of current or ex partner: Not on file    Emotionally abused: Not on file    Physically abused: Not on file    Forced sexual activity: Not on file  Other Topics Concern  . Not on file  Social History Narrative  . Not on file     PHYSICAL EXAM  GENERAL EXAM/CONSTITUTIONAL: Vitals:  Vitals:   01/08/19 1030  BP: 127/89  Pulse: 74  Temp: 97.6 F (36.4 C)  Weight: 189 lb (85.7 kg)   Height: 5\' 8"  (1.727 m)     Body mass index is 28.74 kg/m. Wt Readings from Last 3 Encounters:  01/08/19 189 lb (85.7 kg)  11/14/18 185 lb (83.9 kg)  11/12/18 185 lb 6.4 oz (84.1 kg)     Patient is in no distress; well developed, nourished and groomed; neck is supple  CARDIOVASCULAR:  Examination of carotid arteries is normal; no carotid bruits  Regular rate and rhythm, no murmurs  Examination of peripheral vascular system by observation and palpation is normal  EYES:  Ophthalmoscopic exam of optic discs and posterior segments is normal; no papilledema or hemorrhages  No exam data present  MUSCULOSKELETAL:  Gait, strength, tone, movements noted in Neurologic exam below  NEUROLOGIC: MENTAL STATUS:  No flowsheet data found.  awake, alert, oriented to person, place and time  recent and remote memory intact  normal attention and concentration  language fluent, comprehension intact, naming intact  fund of knowledge appropriate  CRANIAL NERVE:   2nd - no papilledema on fundoscopic exam  2nd, 3rd, 4th, 6th - pupils equal and reactive to light, visual fields full to confrontation, extraocular muscles intact, no nystagmus  5th - facial sensation symmetric  7th - facial strength symmetric  8th - hearing intact  9th - palate elevates symmetrically, uvula midline  11th - shoulder shrug symmetric  12th - tongue protrusion midline  MOTOR:   normal bulk and tone, full strength in the BUE, BLE  SENSORY:   normal and symmetric to light touch, temperature, vibration  COORDINATION:   finger-nose-finger, fine finger movements normal  REFLEXES:   deep tendon reflexes present and symmetric  GAIT/STATION:   narrow based gait     DIAGNOSTIC DATA (LABS, IMAGING, TESTING) - I reviewed patient records, labs, notes, testing and imaging myself where available.  Lab Results  Component Value Date   WBC 5.9 11/12/2018   HGB 15.6 11/12/2018   HCT 45.1  11/12/2018   MCV 84.5 11/12/2018   PLT 102 (L) 11/12/2018      Component Value Date/Time   NA 138 11/12/2018 1140   NA 138 04/18/2016 1209   K 5.1 11/12/2018 1140   CL 102 11/12/2018 1140   CO2 25 11/12/2018 1140   GLUCOSE 106 (H) 11/12/2018 1140   BUN 20 11/12/2018 1140   BUN 18 04/18/2016 1209   CREATININE 1.28 (H) 11/12/2018 1140   CREATININE 1.27 10/26/2015 1114   CALCIUM 9.4 11/12/2018 1140  PROT 7.6 11/12/2018 1140   PROT 8.0 04/18/2016 1209   ALBUMIN 4.4 11/12/2018 1140   ALBUMIN 4.7 04/18/2016 1209   AST 32 11/12/2018 1140   ALT 33 11/12/2018 1140   ALKPHOS 51 11/12/2018 1140   BILITOT 1.1 11/12/2018 1140   BILITOT 0.5 04/18/2016 1209   GFRNONAA >60 11/12/2018 1140   GFRAA >60 11/12/2018 1140   No results found for: CHOL, HDL, LDLCALC, LDLDIRECT, TRIG, CHOLHDL Lab Results  Component Value Date   HGBA1C 5.3 11/19/2015   No results found for: VITAMINB12 Lab Results  Component Value Date   TSH 2.983 11/12/2018    11/18/15 MRI brain / MRA head [I reviewed images myself and agree with interpretation. -VRP]  1. Interval evolution of punctate left cerebellar infarct. 2. Subacute cortical infarct in the anterior right frontal lobe with associated enhancement. The differential diagnosis would include less likely in a septic emboli with focal encephalitis. There is no abscess formation. 3. Acute punctate cortical nonhemorrhagic infarct in the anterior left frontal lobe. 4. Infarcts of varying ages are compatible with a central embolic source in this patient with endocarditis. 5. Normal variant MRA circle of Willis without significant proximal stenosis, aneurysm, or branch vessel occlusion.   ASSESSMENT AND PLAN  58 y.o. year old male here with history of primary antiphospholipid antibody syndrome, DVT, endocarditis, aortic valve replacement, embolic strokes in 2017.   Dx:  1. Migraine with aura and without status migrainosus, not intractable     PLAN:   MIGRAINE WITH AURA (starting 2015; prior to strokes) - start amitriptyline 25mg  at bedtime - use tylenol as needed; try to wean off  - may consider nurtec or ubrelvy for migraine rescue in future - could consider triptan, but caution due to history of strokes (although no evidence of intracranial stenosis on last MRA)  MEMORY LOSS (could be secondary to chronic daily headaches and stress; not likely due to strokes) - optimize mood, headache treatment  STROKE PREVENTION - continue aspirin and Coumadin for antiphospholipid antibody syndrome  Meds ordered this encounter  Medications  . amitriptyline (ELAVIL) 25 MG tablet    Sig: Take 1 tablet (25 mg total) by mouth at bedtime.    Dispense:  30 tablet    Refill:  6   Return in about 6 months (around 07/09/2019).    Suanne MarkerVIKRAM R. Dejana Pugsley, MD 01/08/2019, 11:15 AM Certified in Neurology, Neurophysiology and Neuroimaging  [facility] [address], [address] [place], [place] [address] [phone]

## 2019-01-08 NOTE — Patient Instructions (Signed)
MIGRAINE WITH AURA  - start amitriptyline 25mg  at bedtime (prevention)  - use tylenol as needed; try to wean off over next 1-2 months  - review migraine triggers online  To prevent or relieve headaches, try the following:  Cool Compress. Lie down and place a cool compress on your head.   Avoid headache triggers. If certain foods or odors seem to have triggered your migraines in the past, avoid them. A headache diary might help you identify triggers.   Include physical activity in your daily routine.   Manage stress. Find healthy ways to cope with the stressors, such as delegating tasks on your to-do list.   Practice relaxation techniques. Try deep breathing, yoga, massage and visualization.   Eat regularly. Eating regularly scheduled meals and maintaining a healthy diet might help prevent headaches. Also, drink plenty of fluids.   Follow a regular sleep schedule. Sleep deprivation might contribute to headaches  Consider biofeedback. With this mind-body technique, you learn to control certain bodily functions - such as muscle tension, heart rate and blood pressure - to prevent headaches or reduce headache pain.

## 2019-01-09 ENCOUNTER — Encounter: Payer: Self-pay | Admitting: Diagnostic Neuroimaging

## 2019-01-09 ENCOUNTER — Ambulatory Visit: Payer: BC Managed Care – PPO | Admitting: Physical Therapy

## 2019-01-10 DIAGNOSIS — Z20828 Contact with and (suspected) exposure to other viral communicable diseases: Secondary | ICD-10-CM | POA: Diagnosis not present

## 2019-01-11 ENCOUNTER — Telehealth: Payer: Self-pay | Admitting: *Deleted

## 2019-01-11 DIAGNOSIS — D6861 Antiphospholipid syndrome: Secondary | ICD-10-CM | POA: Diagnosis not present

## 2019-01-11 DIAGNOSIS — Z7901 Long term (current) use of anticoagulants: Secondary | ICD-10-CM | POA: Diagnosis not present

## 2019-01-11 NOTE — Telephone Encounter (Signed)
Received fax from [facility] with INR results from 09/05/2018 to 01/11/2019. Last INR 2.3 on 01/11/2019. Results placed in Dr. Gladstone Pih box. Hubbard Hartshorn, RN, BSN

## 2019-01-16 ENCOUNTER — Encounter: Payer: Self-pay | Admitting: Physical Therapy

## 2019-01-30 ENCOUNTER — Other Ambulatory Visit: Payer: Self-pay | Admitting: Diagnostic Neuroimaging

## 2019-02-01 DIAGNOSIS — S060XAA Concussion with loss of consciousness status unknown, initial encounter: Secondary | ICD-10-CM

## 2019-02-01 HISTORY — DX: Concussion with loss of consciousness status unknown, initial encounter: S06.0XAA

## 2019-03-04 ENCOUNTER — Telehealth: Payer: Self-pay | Admitting: Pharmacist

## 2019-03-04 NOTE — Telephone Encounter (Signed)
Patient texted me results of PST FS POC INR 2.3 (target 2.5 - 3.5) on 40mg  warfarin/wk. INCREASED to 45mg  warfarin/wk (1x5mg  T/Th/Sa; 1 and 1/2 x 5mg  Su/Mo/Wed/Fri). Repeat PSF FS POC INR on 15FEB21. No bleeding.

## 2019-03-28 DIAGNOSIS — M47812 Spondylosis without myelopathy or radiculopathy, cervical region: Secondary | ICD-10-CM | POA: Diagnosis not present

## 2019-03-30 ENCOUNTER — Telehealth: Payer: Self-pay | Admitting: Pharmacist

## 2019-03-30 NOTE — Telephone Encounter (Signed)
Patient texted me results of PST FS POC INR = 2.9 (target 2.5 - 3.5) on 45mg  warfarin/wk. Will increase to 47.5mg  warfarin/wk. 1x5mg  Tu/Th; 1 and 1/2 x 5mg  (7.5mg  dose) all other days. Repeat INR 22MAR21. No bleeding per patient.

## 2019-04-30 ENCOUNTER — Other Ambulatory Visit: Payer: Self-pay | Admitting: Internal Medicine

## 2019-04-30 DIAGNOSIS — I63119 Cerebral infarction due to embolism of unspecified vertebral artery: Secondary | ICD-10-CM

## 2019-04-30 DIAGNOSIS — D6861 Antiphospholipid syndrome: Secondary | ICD-10-CM

## 2019-06-26 DIAGNOSIS — M47812 Spondylosis without myelopathy or radiculopathy, cervical region: Secondary | ICD-10-CM | POA: Diagnosis not present

## 2019-07-09 ENCOUNTER — Ambulatory Visit: Payer: BC Managed Care – PPO | Admitting: Diagnostic Neuroimaging

## 2019-07-19 DIAGNOSIS — D6861 Antiphospholipid syndrome: Secondary | ICD-10-CM | POA: Diagnosis not present

## 2019-07-19 DIAGNOSIS — Z7901 Long term (current) use of anticoagulants: Secondary | ICD-10-CM | POA: Diagnosis not present

## 2019-07-22 ENCOUNTER — Telehealth: Payer: Self-pay | Admitting: Pharmacist

## 2019-07-22 NOTE — Telephone Encounter (Signed)
Called the patient with results of PST FS POC INR that had been provided to me today 21-JUN-21, 2.7 (target 2.5 - 3.5) on 42.5mg  warfarin/wk. INCREASED to 45mg  warfarin/wk as 1&1/2 x 5mg  (7.5mg ) on Su/Mo/We/Fri; 1x5mg  on Tu/Th/Sat. Repeat PST FS POC INR on 12-JUL-21. Patient denies any bleeding or symptoms of embolic events.

## 2019-07-28 ENCOUNTER — Other Ambulatory Visit: Payer: Self-pay

## 2019-07-28 ENCOUNTER — Emergency Department (HOSPITAL_BASED_OUTPATIENT_CLINIC_OR_DEPARTMENT_OTHER)
Admission: EM | Admit: 2019-07-28 | Discharge: 2019-07-28 | Disposition: A | Discharge status: Home / Self Care | Payer: BC Managed Care – PPO | Attending: Emergency Medicine | Admitting: Emergency Medicine

## 2019-07-28 ENCOUNTER — Encounter (HOSPITAL_BASED_OUTPATIENT_CLINIC_OR_DEPARTMENT_OTHER): Payer: Self-pay | Admitting: Emergency Medicine

## 2019-07-28 ENCOUNTER — Emergency Department (HOSPITAL_BASED_OUTPATIENT_CLINIC_OR_DEPARTMENT_OTHER): Payer: BC Managed Care – PPO

## 2019-07-28 DIAGNOSIS — Y92410 Unspecified street and highway as the place of occurrence of the external cause: Secondary | ICD-10-CM | POA: Insufficient documentation

## 2019-07-28 DIAGNOSIS — Z7982 Long term (current) use of aspirin: Secondary | ICD-10-CM | POA: Insufficient documentation

## 2019-07-28 DIAGNOSIS — Z7901 Long term (current) use of anticoagulants: Secondary | ICD-10-CM | POA: Diagnosis not present

## 2019-07-28 DIAGNOSIS — Y999 Unspecified external cause status: Secondary | ICD-10-CM | POA: Insufficient documentation

## 2019-07-28 DIAGNOSIS — S060X0A Concussion without loss of consciousness, initial encounter: Secondary | ICD-10-CM

## 2019-07-28 DIAGNOSIS — D1802 Hemangioma of intracranial structures: Secondary | ICD-10-CM | POA: Diagnosis not present

## 2019-07-28 DIAGNOSIS — Z951 Presence of aortocoronary bypass graft: Secondary | ICD-10-CM | POA: Insufficient documentation

## 2019-07-28 DIAGNOSIS — Y9389 Activity, other specified: Secondary | ICD-10-CM | POA: Insufficient documentation

## 2019-07-28 DIAGNOSIS — S069X9A Unspecified intracranial injury with loss of consciousness of unspecified duration, initial encounter: Secondary | ICD-10-CM | POA: Diagnosis not present

## 2019-07-28 DIAGNOSIS — R42 Dizziness and giddiness: Secondary | ICD-10-CM | POA: Diagnosis not present

## 2019-07-28 DIAGNOSIS — S0990XA Unspecified injury of head, initial encounter: Secondary | ICD-10-CM | POA: Diagnosis not present

## 2019-07-28 DIAGNOSIS — S199XXA Unspecified injury of neck, initial encounter: Secondary | ICD-10-CM | POA: Diagnosis not present

## 2019-07-28 MED ORDER — CYCLOBENZAPRINE HCL 10 MG PO TABS
10.0000 mg | ORAL_TABLET | Freq: Two times a day (BID) | ORAL | 0 refills | Status: DC | PRN
Start: 2019-07-28 — End: 2019-11-19

## 2019-07-28 NOTE — ED Notes (Signed)
Patient transported to CT 
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
## 2019-08-28 DIAGNOSIS — U071 COVID-19: Secondary | ICD-10-CM | POA: Diagnosis not present

## 2019-08-28 DIAGNOSIS — Z20822 Contact with and (suspected) exposure to covid-19: Secondary | ICD-10-CM | POA: Diagnosis not present

## 2019-08-30 ENCOUNTER — Telehealth: Payer: Self-pay | Admitting: Pharmacist

## 2019-08-30 ENCOUNTER — Telehealth: Payer: Self-pay | Admitting: *Deleted

## 2019-08-30 NOTE — Telephone Encounter (Signed)
Made aware by Triage Nurse that PST FS POC INR results had been received for the patient. INR = 4.0 (target 2.5-3.5). Had been on 45mg  warfarin/wk. REDUCED to 40mg  warfarin/wk. Patient has been sent new instructions and provided by phone as well. No bleeding symptoms endorsed by the patient by phone. States he has been confirmed as COVID-19 POSITIVE by laboratory testing. Was asking about symptomatic relief for myalgia's. Discussed use of ibuprofen at FDA approved OTC labeling as well as the use in an alternating fashion--use of acetaminophen, not to exceed the FDA approved labeling provided by the manufacturer.

## 2019-08-30 NOTE — Telephone Encounter (Signed)
Received fax from [facility] with INR results from 11/07/2018 to 08/28/2019. Last INR 4.0 on 08/28/2019. Called Dr. Alexandria Lodge and made him aware. Results placed in Dr. Saralyn Pilar office. Kinnie Feil, BSN, RN-BC

## 2019-09-20 DIAGNOSIS — M25532 Pain in left wrist: Secondary | ICD-10-CM | POA: Diagnosis not present

## 2019-09-20 DIAGNOSIS — S40912A Unspecified superficial injury of left shoulder, initial encounter: Secondary | ICD-10-CM | POA: Diagnosis not present

## 2019-09-20 DIAGNOSIS — M25512 Pain in left shoulder: Secondary | ICD-10-CM | POA: Diagnosis not present

## 2019-11-01 HISTORY — PX: OTHER SURGICAL HISTORY: SHX169

## 2019-11-18 ENCOUNTER — Encounter: Payer: Self-pay | Admitting: Cardiology

## 2019-11-19 ENCOUNTER — Ambulatory Visit (INDEPENDENT_AMBULATORY_CARE_PROVIDER_SITE_OTHER): Payer: BC Managed Care – PPO | Admitting: Cardiology

## 2019-11-19 ENCOUNTER — Encounter: Payer: Self-pay | Admitting: *Deleted

## 2019-11-19 ENCOUNTER — Encounter: Payer: Self-pay | Admitting: Cardiology

## 2019-11-19 ENCOUNTER — Other Ambulatory Visit: Payer: Self-pay

## 2019-11-19 VITALS — BP 122/84 | HR 69 | Ht 68.0 in | Wt 184.0 lb

## 2019-11-19 DIAGNOSIS — R55 Syncope and collapse: Secondary | ICD-10-CM | POA: Insufficient documentation

## 2019-11-19 DIAGNOSIS — D6861 Antiphospholipid syndrome: Secondary | ICD-10-CM

## 2019-11-19 DIAGNOSIS — Z953 Presence of xenogenic heart valve: Secondary | ICD-10-CM

## 2019-11-19 DIAGNOSIS — M3211 Endocarditis in systemic lupus erythematosus: Secondary | ICD-10-CM

## 2019-11-19 DIAGNOSIS — Z951 Presence of aortocoronary bypass graft: Secondary | ICD-10-CM | POA: Diagnosis not present

## 2019-11-19 DIAGNOSIS — I825Z2 Chronic embolism and thrombosis of unspecified deep veins of left distal lower extremity: Secondary | ICD-10-CM | POA: Diagnosis not present

## 2019-11-19 NOTE — Patient Instructions (Signed)
Medication Instructions:  No changes  *If you need a refill on your cardiac medications before your next appointment, please call your pharmacy*   Lab Work: Not needed If you have labs (blood work) drawn today and your tests are completely normal, you will receive your results only by: Marland Kitchen MyChart Message (if you have MyChart) OR . A paper copy in the mail If you have any lab test that is abnormal or we need to change your treatment, we will call you to review the results.   Testing/Procedures:  will be schedule at [address] Your physician has requested that you have an echocardiogram. Echocardiography is a painless test that uses sound waves to create images of your heart. It provides your doctor with information about the size and shape of your heart and how well your heart's chambers and valves are working. This procedure takes approximately one hour. There are no restrictions for this procedure.  And  Your physician has recommended that you wear a 14  DAY ZIO-PATCH monitor. The Zio patch cardiac monitor continuously records heart rhythm data, this is for patients being evaluated for multiple types heart rhythms. For the first 24 hours post application, please avoid getting the Zio monitor wet in the shower or by excessive sweating during exercise. After that, feel free to carry on with regular activities. Keep soaps and lotions away from the ZIO XT Patch.  Someone will be calling you to let you know when this is mailed.  This will be mailed to you, please expect 7-10 days to receive.      Call [facility] Customer Care at [phone] if you have questions regarding your ZIO XT patch monitor.  Call them immediately if you see an orange light blinking on your monitor.   If your monitor falls off in less than 4 days contact our Monitor department at [phone].  If your monitor becomes loose or falls off after 4 days call [employer] at [phone] for  suggestions on securing your monitor    Follow-Up: At [facility], you and your health needs are our priority.  As part of our continuing mission to provide you with exceptional heart care, we have created designated Provider Care Teams.  These Care Teams include your primary Cardiologist (physician) and Advanced Practice Providers (APPs -  Physician Assistants and Nurse Practitioners) who all work together to provide you with the care you need, when you need it.    Your next appointment:   2 to 3  month(s)  The format for your next appointment:   In Person  Provider:   Bryan Lemma, MD   Other Instructions   ZIO XT- Long Term Monitor Instructions   Your physician has requested you wear your ZIO patch monitor___14____days.   This is a single patch monitor.  [employer] supplies one patch monitor per enrollment.  Additional stickers are not available.   Please do not apply patch if you will be having a Nuclear Stress Test, Echocardiogram, Cardiac CT, MRI, or Chest Xray during the time frame you would be wearing the monitor. The patch cannot be worn during these tests.  You cannot remove and re-apply the ZIO XT patch monitor.   Your ZIO patch monitor will be sent USPS Priority mail from [facility] directly to your home address. The monitor may also be mailed to a PO BOX if home delivery is not available.   It may take 3-5 days to receive your monitor after you have  been enrolled.   Once you have received you monitor, please review enclosed instructions.  Your monitor has already been registered assigning a specific monitor serial # to you.   Applying the monitor   Shave hair from upper left chest.   Hold abrader disc by orange tab.  Rub abrader in 40 strokes over left upper chest as indicated in your monitor instructions.   Clean area with 4 enclosed alcohol pads .  Use all pads to assure are is cleaned thoroughly.  Let dry.   Apply patch as indicated in monitor  instructions.  Patch will be place under collarbone on left side of chest with arrow pointing upward.   Rub patch adhesive wings for 2 minutes.Remove white label marked "1".  Remove white label marked "2".  Rub patch adhesive wings for 2 additional minutes.   While looking in a mirror, press and release button in center of patch.  A small green light will flash 3-4 times .  This will be your only indicator the monitor has been turned on.     Do not shower for the first 24 hours.  You may shower after the first 24 hours.   Press button if you feel a symptom. You will hear a small click.  Record Date, Time and Symptom in the Patient Log Book.   When you are ready to remove patch, follow instructions on last 2 pages of Patient Log Book.  Stick patch monitor onto last page of Patient Log Book.   Place Patient Log Book in [place] box.  Use locking tab on box and tape box closed securely.  The [employer] and [employer] has [employer] on it.  Please place in mailbox as soon as possible.  Your physician should have your test results approximately 7 days after the monitor has been mailed back to [facility].   Call [facility] Customer Care at [phone] if you have questions regarding your ZIO XT patch monitor.  Call them immediately if you see an orange light blinking on your monitor.   If your monitor falls off in less than 4 days contact our Monitor department at [phone].  If your monitor becomes loose or falls off after 4 days call [employer] at [phone] for suggestions on securing your monitor.

## 2019-11-19 NOTE — Progress Notes (Signed)
Patient ID: Brian Lloyd, male   DOB: 1960-08-31, 59 y.o.   MRN: [id number] Patient enrolled for [employer] to ship a 14 day ZIO XT long term holter monitor to his home.
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
## 2019-11-24 ENCOUNTER — Encounter: Payer: Self-pay | Admitting: Cardiology

## 2019-11-24 NOTE — Assessment & Plan Note (Signed)
He had CABG, but not for CAD.  It was for an anomalous left main.  Preop cardiac cath showed no significant CAD. He is on aspirin as part of his treatment for Antiphospholipid. Blood pressures well controlled. We will discuss monitoring lipid panel on follow-up visit.

## 2019-11-24 NOTE — Assessment & Plan Note (Signed)
Diagnostic but Dr. Cyndie Chime back in 2017 due to platelet clumping on CBC in setting of aortic mass.  Significant positive results confirming Antiphospholipid Antibody Syndrome with Libman-Sacks Endocarditis.  On warfarin plus aspirin long-term. INR levels have been checked by PCP, but I do not see any recent INR levels.  We will need to confirm with him on follow-up visit that he is actually having INR levels checked.

## 2019-11-24 NOTE — Assessment & Plan Note (Addendum)
He seems to doing pretty well and follow-up echocardiograms of showed normal functioning aortic valve.  No stenosis or regurgitation.  No recurrent vegetations/masses.  With him having recurrent neurologic symptoms, I would like to reevaluate the valve with an echocardiogram.  Plan: Check 2D echocardiogram  Continue to reiterate need for SBE prophylaxis.

## 2019-11-24 NOTE — Assessment & Plan Note (Signed)
Status post AVR complicated by left main compromising believed to CABG.  Follow-up echocardiogram in 2019 was stable.  However now with him having recurrent visual issues, need to confirm no thrombus or other abnormalities especially in light of the fact that I do not have INR levels.  He is to continue to be on warfarin for Antiphospholipid Antibody Syndrome.

## 2019-11-24 NOTE — Assessment & Plan Note (Signed)
Difficult scenario with abdominal pain discussed with complicated history with prior strokes, ocular migraines and now with recurrent vision difficulties following a concussion.  Is also having tachycardia spells associated with near syncope.  Plan is to evaluate with an echocardiogram and a 2-week monitor since he is having these tachycardia spells.

## 2019-11-24 NOTE — Assessment & Plan Note (Signed)
Likely with central event leading to diagnosis of anticardiolipin antibody syndrome.  Long since healed.  Was transitioned from Xarelto to warfarin.  I do not see INR levels for the last year, but apparently has been followed by PCP.

## 2019-11-29 ENCOUNTER — Other Ambulatory Visit (INDEPENDENT_AMBULATORY_CARE_PROVIDER_SITE_OTHER): Payer: BC Managed Care – PPO

## 2019-11-29 DIAGNOSIS — R55 Syncope and collapse: Secondary | ICD-10-CM | POA: Diagnosis not present

## 2019-12-03 ENCOUNTER — Other Ambulatory Visit (HOSPITAL_COMMUNITY): Payer: BC Managed Care – PPO

## 2019-12-03 ENCOUNTER — Telehealth: Payer: Self-pay | Admitting: Cardiology

## 2019-12-03 NOTE — Telephone Encounter (Signed)
Patient is scheduled to have an echo today at 3:05 PM. He is wearing a heart monitor and he would like to know if the placement of the monitor will interfere with echo. Please advise.

## 2019-12-03 NOTE — Telephone Encounter (Signed)
Returned the call to the patient. His echo has been rescheduled for 11/24

## 2019-12-19 DIAGNOSIS — M47812 Spondylosis without myelopathy or radiculopathy, cervical region: Secondary | ICD-10-CM | POA: Diagnosis not present

## 2019-12-20 DIAGNOSIS — R55 Syncope and collapse: Secondary | ICD-10-CM | POA: Diagnosis not present

## 2019-12-25 ENCOUNTER — Other Ambulatory Visit (HOSPITAL_COMMUNITY): Payer: BC Managed Care – PPO

## 2020-01-02 DIAGNOSIS — D6861 Antiphospholipid syndrome: Secondary | ICD-10-CM | POA: Diagnosis not present

## 2020-01-02 DIAGNOSIS — Z7901 Long term (current) use of anticoagulants: Secondary | ICD-10-CM | POA: Diagnosis not present

## 2020-01-22 ENCOUNTER — Ambulatory Visit (HOSPITAL_COMMUNITY): Payer: BC Managed Care – PPO | Attending: Cardiology

## 2020-01-22 ENCOUNTER — Other Ambulatory Visit: Payer: Self-pay

## 2020-01-22 DIAGNOSIS — Z953 Presence of xenogenic heart valve: Secondary | ICD-10-CM | POA: Diagnosis not present

## 2020-01-22 DIAGNOSIS — Z951 Presence of aortocoronary bypass graft: Secondary | ICD-10-CM | POA: Diagnosis not present

## 2020-01-22 DIAGNOSIS — M3211 Endocarditis in systemic lupus erythematosus: Secondary | ICD-10-CM | POA: Insufficient documentation

## 2020-01-22 HISTORY — PX: TRANSTHORACIC ECHOCARDIOGRAM: SHX275

## 2020-01-22 LAB — ECHOCARDIOGRAM COMPLETE
AR max vel: 1.18 cm2
AV Area VTI: 1.16 cm2
AV Area mean vel: 1.21 cm2
AV Mean grad: 10 mmHg
AV Peak grad: 19 mmHg
Ao pk vel: 2.18 m/s
Area-P 1/2: 4.04 cm2
S' Lateral: 3.1 cm

## 2020-02-05 ENCOUNTER — Other Ambulatory Visit: Payer: Self-pay | Admitting: Pharmacist

## 2020-02-05 DIAGNOSIS — D6861 Antiphospholipid syndrome: Secondary | ICD-10-CM

## 2020-02-05 DIAGNOSIS — I63119 Cerebral infarction due to embolism of unspecified vertebral artery: Secondary | ICD-10-CM

## 2020-02-05 MED ORDER — WARFARIN SODIUM 5 MG PO TABS: ORAL_TABLET | ORAL | 1 refills | Status: DC

## 2020-02-05 NOTE — Telephone Encounter (Signed)
Patient texts requesting 3 month supply/refill of warfarin 5mg . Currently taking 40mg  total/wk. 5mg  qd EXCEPT 7.5mg  M/Th. requires 8 tabs/wk x 12wk = 96 tabs (will round to 100). Will reiterate requirement of every 4 week patient self-testing by point of care fingerstick device he has.

## 2020-02-18 ENCOUNTER — Telehealth: Payer: Self-pay | Admitting: *Deleted

## 2020-02-18 ENCOUNTER — Telehealth (INDEPENDENT_AMBULATORY_CARE_PROVIDER_SITE_OTHER): Payer: BC Managed Care – PPO | Admitting: Cardiology

## 2020-02-18 ENCOUNTER — Encounter: Payer: Self-pay | Admitting: Cardiology

## 2020-02-18 DIAGNOSIS — D6861 Antiphospholipid syndrome: Secondary | ICD-10-CM

## 2020-02-18 DIAGNOSIS — R410 Disorientation, unspecified: Secondary | ICD-10-CM | POA: Insufficient documentation

## 2020-02-18 DIAGNOSIS — Z953 Presence of xenogenic heart valve: Secondary | ICD-10-CM

## 2020-02-18 DIAGNOSIS — I825Z2 Chronic embolism and thrombosis of unspecified deep veins of left distal lower extremity: Secondary | ICD-10-CM | POA: Diagnosis not present

## 2020-02-18 DIAGNOSIS — M3211 Endocarditis in systemic lupus erythematosus: Secondary | ICD-10-CM | POA: Diagnosis not present

## 2020-02-18 DIAGNOSIS — I63119 Cerebral infarction due to embolism of unspecified vertebral artery: Secondary | ICD-10-CM | POA: Diagnosis not present
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
## 2020-02-18 NOTE — Assessment & Plan Note (Signed)
History of stroke related to Libman-Sacks endocarditis, complicated by head injury in 2021.  Now having intermittent episodes of loss of ability to focus.  Symptoms sound like they may be related to his seizure activity.  Suggest neurology evaluation.

## 2020-02-18 NOTE — Assessment & Plan Note (Signed)
Interestingly enough, DVT in the setting of injury led to final diagnosis of Antiphospholipid/Anticardiolipin Antibody Syndrome associated with f Libman-Sacks
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
## 2020-02-18 NOTE — Assessment & Plan Note (Signed)
Remains on warfarin goal INR 2.5-3.5.

## 2020-02-18 NOTE — Assessment & Plan Note (Signed)
Well-seated valve.  Continue to reiterate SBE prophylaxis required.

## 2020-02-18 NOTE — Patient Instructions (Addendum)
Medication Instructions:  No new changes  *If you need a refill on your cardiac medications before your next appointment, please call your pharmacy*   Lab Work:  Continue to follow-up with PCP    Testing/Procedures:  No new testing for cardiac concerns.   Follow-Up: At [facility], you and your health needs are our priority.  As part of our continuing mission to provide you with exceptional heart care, we have created designated Provider Care Teams.  These Care Teams include your primary Cardiologist (physician) and Advanced Practice Providers (APPs -  Physician Assistants and Nurse Practitioners) who all work together to provide you with the care you need, when you need it.     Your next appointment:   October 2022  ( 9 months)  The format for your next appointment:   In Person  Provider:   Bryan Lemma, MD   Other Instructions Would recommend discussing with PCP concerns about these episodes of confusion.  It may warrant going back to neurology for evaluation.  Also want to remind you that if you are having any GI procedures or intensive dentist procedures, this would require the use of antibiotic prophylaxis for endocarditis because of your prosthetic valve.

## 2020-02-18 NOTE — Telephone Encounter (Signed)
RN spoke to patient. Instruction were given  from today's virtual visit 02/18/20 .  AVS SUMMARY has been sent by mychart. Aware needs SBE prophylaxis.  Recall placed for follow up appointment for Oct 2022.   Patient verbalized understanding

## 2020-02-18 NOTE — Assessment & Plan Note (Signed)
Successful complicated AVR with resultant two-vessel CABG because of left main compromise. Normal functioning valve.  No further aortic valve issues.  Remains on anticoagulation for antiphospholipid antibody syndrome.

## 2020-02-21 ENCOUNTER — Telehealth: Payer: BC Managed Care – PPO | Admitting: Cardiology

## 2020-03-07 DIAGNOSIS — Z03818 Encounter for observation for suspected exposure to other biological agents ruled out: Secondary | ICD-10-CM | POA: Diagnosis not present

## 2020-03-07 DIAGNOSIS — Z20822 Contact with and (suspected) exposure to covid-19: Secondary | ICD-10-CM | POA: Diagnosis not present

## 2020-04-15 DIAGNOSIS — Z7901 Long term (current) use of anticoagulants: Secondary | ICD-10-CM | POA: Diagnosis not present

## 2020-04-15 DIAGNOSIS — D6861 Antiphospholipid syndrome: Secondary | ICD-10-CM | POA: Diagnosis not present

## 2020-04-23 DIAGNOSIS — M47812 Spondylosis without myelopathy or radiculopathy, cervical region: Secondary | ICD-10-CM | POA: Diagnosis not present

## 2020-05-06 DIAGNOSIS — Z1212 Encounter for screening for malignant neoplasm of rectum: Secondary | ICD-10-CM | POA: Diagnosis not present

## 2020-05-06 DIAGNOSIS — Z1211 Encounter for screening for malignant neoplasm of colon: Secondary | ICD-10-CM | POA: Diagnosis not present

## 2020-06-11 ENCOUNTER — Telehealth: Payer: Self-pay | Admitting: Pharmacist

## 2020-06-11 NOTE — Telephone Encounter (Signed)
Patient PST FS POC INR results from 6-MAY-22 made available to me today. CONTINUE SAME REGIMEN (5mg  all days of week-EXCEPT on Mondays/Thursdays--take 7.5mg  (1&1/2 x 5mg ). Repeat on 20-JUN-22. No bleeding reported. INR in target range (3.0 value [2.5 - 3.5 target]).

## 2020-07-01 ENCOUNTER — Other Ambulatory Visit: Payer: Self-pay | Admitting: Pharmacist

## 2020-07-01 DIAGNOSIS — I63119 Cerebral infarction due to embolism of unspecified vertebral artery: Secondary | ICD-10-CM

## 2020-07-01 DIAGNOSIS — D6861 Antiphospholipid syndrome: Secondary | ICD-10-CM

## 2020-07-01 MED ORDER — WARFARIN SODIUM 5 MG PO TABS: ORAL_TABLET | ORAL | 1 refills | Status: DC

## 2020-07-24 DIAGNOSIS — D6861 Antiphospholipid syndrome: Secondary | ICD-10-CM | POA: Diagnosis not present

## 2020-07-24 DIAGNOSIS — M47812 Spondylosis without myelopathy or radiculopathy, cervical region: Secondary | ICD-10-CM | POA: Diagnosis not present

## 2020-07-24 DIAGNOSIS — Z7901 Long term (current) use of anticoagulants: Secondary | ICD-10-CM | POA: Diagnosis not present

## 2020-07-24 DIAGNOSIS — D6859 Other primary thrombophilia: Secondary | ICD-10-CM | POA: Diagnosis not present

## 2020-08-27 ENCOUNTER — Telehealth: Payer: Self-pay | Admitting: Pharmacist

## 2020-08-27 NOTE — Telephone Encounter (Signed)
Called by [employer], vendor of patient self testing point of care device to the patient, reporting INR today = 6.0 on 40mg  warfarin/wk. I called the patient, went to VM but left VM AND texted him to OMIT/HOLD today and tomorrow's warfarin. I will ask him to REPEAT INR on Friday, August 28, 2020 and subsequent dosing instructions will be provided based upon INR IF the INR tomorrow has fallen appropriately.

## 2020-08-28 ENCOUNTER — Telehealth: Payer: Self-pay | Admitting: Pharmacist

## 2020-08-28 NOTE — Telephone Encounter (Signed)
Patient called to report PST FS POC INR. Stated his device was giving him an error reading. Had the patient check battery indicator (3/4 battery reading); Had him power-off/power back on. Had him check the chip to strip (both coordinated). Finally got another reading and it showed "6" again. Patient states he had problems within the past couple of weeks, went to a Pharmacy that performs FS INRs and his value then was "3". Given this patient's history of multiple embolic events, I cannot rely upon this devices' reading for today (he did omit yesterday's dose). With shared decision making, we agree to use his usual dose of 7.5mg  today followed by 5mg  on Sa/Su. He agrees to come to CLINIC on Monday for me to perform an INR. He was instructed to contact his INR device vendor for advice/issuance of a replacement device. He said he would. Will see patient on Monday 1-AUG-22 after 11:00 am.

## 2020-08-31 ENCOUNTER — Ambulatory Visit (INDEPENDENT_AMBULATORY_CARE_PROVIDER_SITE_OTHER): Payer: BC Managed Care – PPO | Admitting: Pharmacist

## 2020-08-31 DIAGNOSIS — I825Z2 Chronic embolism and thrombosis of unspecified deep veins of left distal lower extremity: Secondary | ICD-10-CM | POA: Diagnosis not present

## 2020-08-31 DIAGNOSIS — D6861 Antiphospholipid syndrome: Secondary | ICD-10-CM | POA: Diagnosis not present

## 2020-08-31 DIAGNOSIS — I63119 Cerebral infarction due to embolism of unspecified vertebral artery: Secondary | ICD-10-CM | POA: Diagnosis not present

## 2020-08-31 DIAGNOSIS — Z953 Presence of xenogenic heart valve: Secondary | ICD-10-CM

## 2020-08-31 DIAGNOSIS — Z8673 Personal history of transient ischemic attack (TIA), and cerebral infarction without residual deficits: Secondary | ICD-10-CM

## 2020-08-31 DIAGNOSIS — I639 Cerebral infarction, unspecified: Secondary | ICD-10-CM

## 2020-08-31 LAB — POCT INR: INR: 6.3 — AB (ref 2.0–3.0)

## 2020-08-31 NOTE — Progress Notes (Signed)
Anticoagulation Management Brian Lloyd is a 60 y.o. male who reports to the clinic for monitoring of warfarin treatment.    Indication:  Patient has antiphospholipid antibody syndrome requiring warfarin oral anticoagulation with targeted INR 2.5 - 3.5 as established by the patient's historical hematologist (Dr. Cephas Darby) before his retirement; History of CVA; History of aortic valve replacement surgery with bioprosthetic valve.   Duration: indefinite Supervising physician:  Dr. Tera Mater  Anticoagulation Clinic Visit History: Patient does not report signs/symptoms of bleeding or thromboembolism  Other recent changes: No diet, medications, lifestyle changes except as noted in the patient findings.  Anticoagulation Episode Summary     Current INR goal:  2.5-3.5  TTR:  39.6 % (4.4 y)  Next INR check:  09/02/2020  INR from last check:  6.3 (08/31/2020)  Weekly max warfarin dose:    Target end date:  Indefinite  INR check location:  Anticoagulation Clinic  Preferred lab:    Send INR reminders to:     Indications   Acute cerebral infarction (HCC) [I63.9] Antiphospholipid antibody syndrome (HCC) [D68.61] 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) [I82.5Z2] Cerebrovascular accident (CVA) due to embolism of vertebral artery (HCC) [I63.119] History of CVA (cerebrovascular accident) without residual deficits [Z86.73] S/P aortic valve replacement with bioprosthetic valve [Z95.3]        Comments:  Patient commenced attending this clinic on 11/12/2015         Allergies  Allergen Reactions   Naproxen Anaphylaxis    Current Outpatient Medications:    acetaminophen (TYLENOL) 500 MG tablet, Take 1,000 mg by mouth every 6 (six) hours as needed for moderate pain or headache. , Disp: , Rfl:    aspirin EC 81 MG tablet, Take 81 mg by mouth daily., Disp: , Rfl:    Multiple Vitamins-Minerals (MULTIVITAMIN PO), Take 1 tablet by mouth daily.,  Disp: , Rfl:    psyllium (METAMUCIL) 58.6 % packet, Take 1 packet by mouth daily., Disp: , Rfl:    Saw Palmetto, Serenoa repens, (SAW PALMETTO PO), Take 1 capsule by mouth 2 (two) times daily., Disp: , Rfl:    warfarin (COUMADIN) 5 MG tablet, Take one-and-one-half (1 & 1/2) tablets on Mondays and Thursdays. All other days, take only one (1) tablet., Disp: 100 tablet, Rfl: 1   HYDROcodone-acetaminophen (NORCO) 7.5-325 MG tablet, TALE 1 TABLET AS NEEDED TWICE A DAY FOR SEVERE PAIN (MAY FILL 11/01/2018) 30 DAYS (Patient not taking: Reported on 08/31/2020), Disp: , Rfl:  Past Medical History:  Diagnosis Date   Acute cerebral infarction (HCC) 11/18/2015   Bifrontal embolic infarcts MRI/MRA 11/18/15; known aortic valve vegetation on lovenox/coumadin/ASA   Antiphospholipid antibody syndrome (HCC) 11/06/2015   11/04/15 significant elevation of IgG anticardiolipin & beta-2-GP-1 antibodies   Arthritis    hands   Cerebellar infarct (HCC) 10/06/2015   Cardiac MRI showed acute to subacute lacunar infarct in the left cerebellum   DVT, lower extremity, distal, chronic (HCC) 08/2015   Left calf DVT following injury   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 with ischemia Intra-Op => emergent two-vessel CABG: SVG-dLAD & SVG-OM)   Thrombocytopenia (HCC)    Social History   Socioeconomic History   Marital status: Married    Spouse name: Not on file   Number of children: Not on file   Years of education: Not on file   Highest education level:  Not on file  Occupational History   Not on file  Tobacco Use   Smoking status: Never   Smokeless tobacco: Never  Substance and Sexual Activity   Alcohol use: Not Currently    Alcohol/week: 3.0 standard drinks    Types: 1 Glasses of wine, 1 Cans of beer, 1 Shots of liquor per week   Drug use: No   Sexual activity: Yes  Other Topics Concern   Not on file  Social History Narrative   Not on file   Social Determinants of Health   Financial Resource Strain: Not on file  Food Insecurity: Not on file  Transportation Needs: Not on file  Physical Activity: Not on file  Stress: Not on file  Social Connections: Not on file   Family History  Problem Relation Age of Onset   Heart disease Father    Hypertension Father    Heart disease Maternal Grandfather    Hypertension Maternal Grandfather    Diabetes Paternal Grandfather    Heart disease Paternal Grandfather     ASSESSMENT Recent Results: The most recent result is correlated with  dose of 7.5mg  today followed by 5mg  on Sa/Su and seen today in [facility].  Lab Results  Component Value Date   INR 6.3 (A) 08/31/2020   INR 3.3 (A) 09/17/2018   INR 3.0 08/31/2018    Anticoagulation Dosing: Description   Patient instructed to OMIT doses on Monday 1-AUG-22 and Tuesday 2-AUG-22 and report to his Primary Care Provider on Wednesday 3-AUG-22, OR perform patient self testing and provide the result to his primary care physician for future dosing of warfarin by the primary care physician.      INR today: Supratherapeutic  PLAN Weekly dose was decreased by OMITTING today's dose of warfarin and tomorrow's dose of warfarin and Wednesday's dose of warfarin. Patient was advised to purchase and drink one bottle of Lipton's Green Tea for the vitamin K1 content to decrease INR. Should INR be LESS than 2.5 on Wednesday 3-AUG-22, I would recommend using enoxaparin/Lovenox 1mg  per kilogram, subcutaneously administered every 12 hours until such time the INR  is ABOVE 2.5. Range for this patient as established by his hematologist is 2.5 - 3.5.   Patient Instructions  Patient instructed to take medications as defined in the Anti-coagulation Track section of this encounter.  Patient instructed to OMIT today's dose and doses on Tuesday and Wednesday. Patient instructed to take NO WARFARIN until advised by his primary care provider on Wednesday.  Patient verbalized understanding of these instructions.   Patient advised to contact clinic or seek medical attention if signs/symptoms of bleeding or thromboembolism occur.  Patient verbalized understanding by repeating back information and was advised to contact me if further medication-related questions arise. Patient was also provided an information handout.  Follow-up Return in 2 days (on 09/02/2020) for Follow up INR.  Monday, PharmD, CPP  15 minutes spent face-to-face with the patient during the encounter. 50% of time spent on education, including signs/sx bleeding and clotting, as well as food and drug interactions with warfarin. 50% of time was spent on fingerprick  POC INR sample collection,processing, results determination, and documentation in [email or web address].

## 2020-08-31 NOTE — Patient Instructions (Signed)
Patient instructed to take medications as defined in the Anti-coagulation Track section of this encounter.  Patient instructed to OMIT today's dose and doses on Tuesday and Wednesday. Patient instructed to take NO WARFARIN until advised by his primary care provider on Wednesday.  Patient verbalized understanding of these instructions.

## 2020-09-01 NOTE — Progress Notes (Signed)
INTERNAL MEDICINE TEACHING ATTENDING ADDENDUM   I agree with pharmacy recommendations as outlined in their note.   Sabrine Patchen, MD  

## 2020-09-07 ENCOUNTER — Telehealth: Payer: Self-pay | Admitting: Pharmacist

## 2020-09-07 DIAGNOSIS — Z7901 Long term (current) use of anticoagulants: Secondary | ICD-10-CM | POA: Diagnosis not present

## 2020-09-07 DIAGNOSIS — D6861 Antiphospholipid syndrome: Secondary | ICD-10-CM | POA: Diagnosis not present

## 2020-09-07 NOTE — Telephone Encounter (Signed)
Patient has performed PST FS POC INR determinations on 5-AUG-22 and 8-AUG-22. On 5-AUG-22, his INR was 2.6 (target 2.5 - 3.5 as established by Dr. Cyndie Chime before his retirement, higher range accounted for due to multiple embolic events, and a hypercoagulable state.). He was instructed on 5-AUG-22 to take 1 and 1/2 x 5mg  (7.5mg  dose) for two consecutive days (5-AUG-22; 6-AUG-22) then 5mg  on Sunday 7-AUG-22. INR repeated today is 3.7 (target goal 2.5 - 3.5). Have provided the patient the following instructions:  1x5mg  on Monday, Tuesday, Thursday, Saturday and Sunday; 1 and 1/2 x 5mg  (7.5mg  dose) on Wednesday and Friday. Repeat PST FS POC INR on 15-AUG-22.

## 2020-09-15 ENCOUNTER — Telehealth: Payer: Self-pay

## 2020-09-15 NOTE — Telephone Encounter (Signed)
Received fax INR self-testing Summary report from Remote INR with new INR result from 09/15/20 listed at 3.6. Will send in-basket message to Dr. Alexandria Lodge and place faxed result on Dr. Saralyn Pilar desk. SChaplin, RN,BSN

## 2020-10-02 ENCOUNTER — Telehealth: Payer: Self-pay

## 2020-10-02 ENCOUNTER — Other Ambulatory Visit: Payer: Self-pay | Admitting: Internal Medicine

## 2020-10-02 ENCOUNTER — Telehealth: Payer: Self-pay | Admitting: Hematology & Oncology

## 2020-10-02 DIAGNOSIS — Z8673 Personal history of transient ischemic attack (TIA), and cerebral infarction without residual deficits: Secondary | ICD-10-CM

## 2020-10-02 DIAGNOSIS — D6861 Antiphospholipid syndrome: Secondary | ICD-10-CM

## 2020-10-02 NOTE — Progress Notes (Signed)
  Spoke with Dr Alexandria Lodge, patient needing referral to hematology.  Patient historically followed by Dr Cyndie Chime has continued to see Dr Alexandria Lodge for his anticoagulation now needing to transfer care to physician.  Dr Cyndie Chime had historically requested INR range of 2.5-3.5

## 2020-10-02 NOTE — Telephone Encounter (Signed)
Patient called requesting to transfer to our office as it is closer to him. Informed him just to inform Dr.Groce and he can place a referral to our office and we can get him set up. Patient verbalized understanding

## 2020-10-02 NOTE — Telephone Encounter (Signed)
Called and left a VM for patient regarding appointments scheduled at [facility] w/ Dr Myna Hidalgo.  I also left the phone number to call if he needed to reschedule

## 2020-10-23 DIAGNOSIS — M47812 Spondylosis without myelopathy or radiculopathy, cervical region: Secondary | ICD-10-CM | POA: Diagnosis not present

## 2020-10-26 ENCOUNTER — Other Ambulatory Visit: Payer: Self-pay

## 2020-10-26 ENCOUNTER — Encounter: Payer: Self-pay | Admitting: Hematology & Oncology

## 2020-10-26 ENCOUNTER — Inpatient Hospital Stay: Payer: BC Managed Care – PPO | Attending: Hematology & Oncology

## 2020-10-26 ENCOUNTER — Inpatient Hospital Stay (HOSPITAL_BASED_OUTPATIENT_CLINIC_OR_DEPARTMENT_OTHER): Payer: BC Managed Care – PPO | Admitting: Hematology & Oncology

## 2020-10-26 VITALS — BP 149/86 | HR 69 | Temp 98.4°F | Resp 18 | Ht 68.0 in | Wt 177.1 lb

## 2020-10-26 DIAGNOSIS — D6861 Antiphospholipid syndrome: Secondary | ICD-10-CM

## 2020-10-26 DIAGNOSIS — Z7901 Long term (current) use of anticoagulants: Secondary | ICD-10-CM | POA: Diagnosis not present

## 2020-10-26 DIAGNOSIS — Z79899 Other long term (current) drug therapy: Secondary | ICD-10-CM | POA: Diagnosis not present

## 2020-10-26 DIAGNOSIS — Z7982 Long term (current) use of aspirin: Secondary | ICD-10-CM

## 2020-10-26 DIAGNOSIS — Z86718 Personal history of other venous thrombosis and embolism: Secondary | ICD-10-CM | POA: Insufficient documentation

## 2020-10-26 LAB — CBC WITH DIFFERENTIAL (CANCER CENTER ONLY)
Abs Immature Granulocytes: 0.02 10*3/uL (ref 0.00–0.07)
Basophils Absolute: 0 10*3/uL (ref 0.0–0.1)
Basophils Relative: 1 %
Eosinophils Absolute: 0.1 10*3/uL (ref 0.0–0.5)
Eosinophils Relative: 2 %
HCT: 40 % (ref 39.0–52.0)
Hemoglobin: 13.9 g/dL (ref 13.0–17.0)
Immature Granulocytes: 0 %
Lymphocytes Relative: 21 %
Lymphs Abs: 1.1 10*3/uL (ref 0.7–4.0)
MCH: 29.8 pg (ref 26.0–34.0)
MCHC: 34.8 g/dL (ref 30.0–36.0)
MCV: 85.8 fL (ref 80.0–100.0)
Monocytes Absolute: 0.3 10*3/uL (ref 0.1–1.0)
Monocytes Relative: 6 %
Neutro Abs: 3.7 10*3/uL (ref 1.7–7.7)
Neutrophils Relative %: 70 %
Platelet Count: 106 10*3/uL — ABNORMAL LOW (ref 150–400)
RBC: 4.66 MIL/uL (ref 4.22–5.81)
RDW: 12.1 % (ref 11.5–15.5)
WBC Count: 5.3 10*3/uL (ref 4.0–10.5)
nRBC: 0 % (ref 0.0–0.2)

## 2020-10-26 LAB — CMP (CANCER CENTER ONLY)
ALT: 23 U/L (ref 0–44)
AST: 24 U/L (ref 15–41)
Albumin: 4.4 g/dL (ref 3.5–5.0)
Alkaline Phosphatase: 42 U/L (ref 38–126)
Anion gap: 9 (ref 5–15)
BUN: 21 mg/dL (ref 8–23)
CO2: 27 mmol/L (ref 22–32)
Calcium: 9.5 mg/dL (ref 8.9–10.3)
Chloride: 106 mmol/L (ref 98–111)
Creatinine: 1.23 mg/dL (ref 0.61–1.24)
GFR, Estimated: >60 mL/min (ref >60)
Glucose, Bld: 113 mg/dL — ABNORMAL HIGH (ref 70–99)
Potassium: 4 mmol/L (ref 3.5–5.1)
Sodium: 142 mmol/L (ref 135–145)
Total Bilirubin: 0.7 mg/dL (ref 0.3–1.2)
Total Protein: 7.3 g/dL (ref 6.5–8.1)
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
## 2020-10-27 LAB — CARDIOLIPIN ANTIBODIES, IGG, IGM, IGA
Anticardiolipin IgA: 16 APL U/mL — ABNORMAL HIGH (ref 0–11)
Anticardiolipin IgG: 129 GPL U/mL — ABNORMAL HIGH (ref 0–14)
Anticardiolipin IgM: 83 MPL U/mL — ABNORMAL HIGH (ref 0–12)

## 2020-10-28 LAB — BETA-2-GLYCOPROTEIN I ABS, IGG/M/A
Beta-2 Glyco I IgG: >150 GPI IgG units — ABNORMAL HIGH (ref 0–20)
Beta-2-Glycoprotein I IgA: 65 GPI IgA units — ABNORMAL HIGH (ref 0–25)
Beta-2-Glycoprotein I IgM: 120 GPI IgM units — ABNORMAL HIGH (ref 0–32)

## 2020-11-02 LAB — LUPUS ANTICOAGULANT PANEL
DRVVT: >180 s — ABNORMAL HIGH (ref 0.0–47.0)
PTT Lupus Anticoagulant: 136 s — ABNORMAL HIGH (ref 0.0–51.9)

## 2020-11-02 LAB — PTT-LA MIX: PTT-LA Mix: 99.7 s — ABNORMAL HIGH (ref 0.0–48.9)

## 2020-11-02 LAB — DRVVT MIX: dRVVT Mix: 108.7 s — ABNORMAL HIGH (ref 0.0–40.4)

## 2020-11-02 LAB — HEXAGONAL PHASE PHOSPHOLIPID: Hexagonal Phase Phospholipid: 71 s — ABNORMAL HIGH (ref 0–11)

## 2020-11-02 LAB — DRVVT CONFIRM: dRVVT Confirm: >3 ratio — ABNORMAL HIGH (ref 0.8–1.2)

## 2020-11-10 ENCOUNTER — Other Ambulatory Visit: Payer: BC Managed Care – PPO

## 2020-11-18 DIAGNOSIS — Z7901 Long term (current) use of anticoagulants: Secondary | ICD-10-CM | POA: Diagnosis not present

## 2020-11-18 DIAGNOSIS — D6861 Antiphospholipid syndrome: Secondary | ICD-10-CM | POA: Diagnosis not present

## 2020-12-29 ENCOUNTER — Other Ambulatory Visit: Payer: Self-pay

## 2020-12-29 DIAGNOSIS — I63119 Cerebral infarction due to embolism of unspecified vertebral artery: Secondary | ICD-10-CM

## 2020-12-29 DIAGNOSIS — D6861 Antiphospholipid syndrome: Secondary | ICD-10-CM

## 2020-12-29 MED ORDER — WARFARIN SODIUM 5 MG PO TABS: ORAL_TABLET | ORAL | 1 refills | Status: DC

## 2021-01-04 ENCOUNTER — Inpatient Hospital Stay: Payer: BC Managed Care – PPO | Admitting: Hematology & Oncology

## 2021-01-04 ENCOUNTER — Other Ambulatory Visit: Payer: Self-pay

## 2021-01-04 ENCOUNTER — Inpatient Hospital Stay: Payer: BC Managed Care – PPO | Attending: Hematology & Oncology

## 2021-01-04 ENCOUNTER — Encounter: Payer: Self-pay | Admitting: Hematology & Oncology

## 2021-01-04 DIAGNOSIS — Z79899 Other long term (current) drug therapy: Secondary | ICD-10-CM | POA: Insufficient documentation

## 2021-01-04 DIAGNOSIS — Z86718 Personal history of other venous thrombosis and embolism: Secondary | ICD-10-CM | POA: Insufficient documentation

## 2021-01-04 DIAGNOSIS — D6861 Antiphospholipid syndrome: Secondary | ICD-10-CM | POA: Diagnosis not present

## 2021-01-04 DIAGNOSIS — Z7901 Long term (current) use of anticoagulants: Secondary | ICD-10-CM | POA: Insufficient documentation

## 2021-01-04 DIAGNOSIS — Z7982 Long term (current) use of aspirin: Secondary | ICD-10-CM | POA: Insufficient documentation

## 2021-01-04 LAB — CMP (CANCER CENTER ONLY)
ALT: 32 U/L (ref 0–44)
AST: 27 U/L (ref 15–41)
Albumin: 4.4 g/dL (ref 3.5–5.0)
Alkaline Phosphatase: 48 U/L (ref 38–126)
Anion gap: 8 (ref 5–15)
BUN: 24 mg/dL — ABNORMAL HIGH (ref 8–23)
CO2: 28 mmol/L (ref 22–32)
Calcium: 10.1 mg/dL (ref 8.9–10.3)
Chloride: 103 mmol/L (ref 98–111)
Creatinine: 1.24 mg/dL (ref 0.61–1.24)
GFR, Estimated: >60 mL/min (ref >60)
Glucose, Bld: 110 mg/dL — ABNORMAL HIGH (ref 70–99)
Potassium: 4.3 mmol/L (ref 3.5–5.1)
Sodium: 139 mmol/L (ref 135–145)
Total Bilirubin: 0.6 mg/dL (ref 0.3–1.2)
Total Protein: 7.5 g/dL (ref 6.5–8.1)

## 2021-01-04 LAB — PROTIME-INR
INR: 2.4 — ABNORMAL HIGH (ref 0.8–1.2)
Prothrombin Time: 26.4 seconds — ABNORMAL HIGH (ref 11.4–15.2)

## 2021-01-04 LAB — CBC WITH DIFFERENTIAL (CANCER CENTER ONLY)
Abs Immature Granulocytes: 0.04 10*3/uL (ref 0.00–0.07)
Basophils Absolute: 0 10*3/uL (ref 0.0–0.1)
Basophils Relative: 1 %
Eosinophils Absolute: 0.1 10*3/uL (ref 0.0–0.5)
Eosinophils Relative: 2 %
HCT: 45.3 % (ref 39.0–52.0)
Hemoglobin: 15.5 g/dL (ref 13.0–17.0)
Immature Granulocytes: 1 %
Lymphocytes Relative: 20 %
Lymphs Abs: 1.3 10*3/uL (ref 0.7–4.0)
MCH: 29.7 pg (ref 26.0–34.0)
MCHC: 34.2 g/dL (ref 30.0–36.0)
MCV: 86.8 fL (ref 80.0–100.0)
Monocytes Absolute: 0.4 10*3/uL (ref 0.1–1.0)
Monocytes Relative: 6 %
Neutro Abs: 4.6 10*3/uL (ref 1.7–7.7)
Neutrophils Relative %: 70 %
Platelet Count: 109 10*3/uL — ABNORMAL LOW (ref 150–400)
RBC: 5.22 MIL/uL (ref 4.22–5.81)
RDW: 12.1 % (ref 11.5–15.5)
WBC Count: 6.5 10*3/uL (ref 4.0–10.5)
nRBC: 0 % (ref 0.0–0.2)

## 2021-01-05 ENCOUNTER — Other Ambulatory Visit: Payer: Self-pay

## 2021-01-05 ENCOUNTER — Inpatient Hospital Stay (HOSPITAL_BASED_OUTPATIENT_CLINIC_OR_DEPARTMENT_OTHER): Payer: BC Managed Care – PPO | Admitting: Hematology & Oncology

## 2021-01-05 ENCOUNTER — Encounter: Payer: Self-pay | Admitting: Hematology & Oncology

## 2021-01-05 VITALS — BP 128/76 | HR 74 | Temp 97.8°F | Resp 18 | Wt 180.0 lb

## 2021-01-05 DIAGNOSIS — D6861 Antiphospholipid syndrome: Secondary | ICD-10-CM

## 2021-01-05 NOTE — Progress Notes (Signed)
Hematology and Oncology Follow Up Visit  Brian Lloyd [id number] 1960/05/04 60 y.o. 01/05/2021   Principle Diagnosis:  Antiphospholipid antibody syndrome  Current Therapy:   Lifelong Coumadin/low-dose aspirin --patient has home INR monitoring system.  Coumadin clinic is managing the INR.     Interim History:  Brian Lloyd is in for follow-up.  He is doing quite nicely.  We first saw him back in September.  Since then, he has been doing quite nicely.  He checks his Coumadin at home.  There apparently is a link that we can go onto the Interweb 2 see what the numbers are.  He has had no problems with bleeding.  He had a good Thanksgiving.  He had a very nice Christmas.  In January, he will be heading down to [place] for vacation.  He is still working.  He is quite busy at work.  He has had no problems with cough or shortness of breath.  He has had no issues with COVID.  I think that he said that his last INR was about 3.  The Coumadin Clinic gets his results and helps him monitor the Coumadin dosing.  Overall, his performance status is ECOG 0.     Medications:  Current Outpatient Medications:    aspirin EC 81 MG tablet, Take 81 mg by mouth daily., Disp: , Rfl:    Multiple Vitamin (MULTIVITAMINS PO), 1 tablet, Disp: , Rfl:    psyllium (METAMUCIL) 58.6 % packet, Take 1 packet by mouth daily., Disp: , Rfl:    Saw Palmetto, Serenoa repens, (SAW PALMETTO PO), Take 1 capsule by mouth 2 (two) times daily., Disp: , Rfl:    warfarin (COUMADIN) 5 MG tablet, Take one-and-one-half (1 & 1/2) tablets on Mondays and Thursdays. All other days, take only one (1) tablet., Disp: 100 tablet, Rfl: 1   acetaminophen (TYLENOL) 500 MG tablet, Take 1,000 mg by mouth every 6 (six) hours as needed for moderate pain or headache.  (Patient not taking: Reported on 01/05/2021), Disp: , Rfl:    HYDROcodone-acetaminophen (NORCO) 7.5-325 MG tablet, , Disp: , Rfl:    tetrahydrozoline 0.05 % ophthalmic  solution, 1 drop into affected eye as needed (Patient not taking: Reported on 01/05/2021), Disp: , Rfl:   Allergies:  Allergies  Allergen Reactions   Naproxen Anaphylaxis    Past Medical History, Surgical history, Social history, and Family History were reviewed and updated.  Review of Systems: Review of Systems  Constitutional: Negative.   HENT:  Negative.    Eyes: Negative.   Respiratory: Negative.    Cardiovascular: Negative.   Gastrointestinal: Negative.   Endocrine: Negative.   Genitourinary: Negative.    Musculoskeletal:  Positive for back pain, myalgias and neck pain.  Skin: Negative.   Neurological:  Positive for dizziness.  Hematological:  Bruises/bleeds easily.  Psychiatric/Behavioral: Negative.     Physical Exam:  weight is 180 lb (81.6 kg). His oral temperature is 97.8 F (36.6 C). His blood pressure is 128/76 and his pulse is 74. His respiration is 18 and oxygen saturation is 100%.   Wt Readings from Last 3 Encounters:  01/05/21 180 lb (81.6 kg)  01/04/21 180 lb (81.6 kg)  10/26/20 177 lb 1.9 oz (80.3 kg)    Physical Exam Vitals reviewed.  HENT:     Head: Normocephalic and atraumatic.  Eyes:     Pupils: Pupils are equal, round, and reactive to light.  Cardiovascular:     Rate and Rhythm: Normal rate and regular rhythm.  Heart sounds: Normal heart sounds.  Pulmonary:     Effort: Pulmonary effort is normal.     Breath sounds: Normal breath sounds.  Abdominal:     General: Bowel sounds are normal.     Palpations: Abdomen is soft.  Musculoskeletal:        General: No tenderness or deformity. Normal range of motion.     Cervical back: Normal range of motion.  Lymphadenopathy:     Cervical: No cervical adenopathy.  Skin:    General: Skin is warm and dry.     Findings: No erythema or rash.  Neurological:     Mental Status: He is alert and oriented to person, place, and time.  Psychiatric:        Behavior: Behavior normal.        Thought Content:  Thought content normal.        Judgment: Judgment normal.     Lab Results  Component Value Date   WBC 6.5 01/04/2021   HGB 15.5 01/04/2021   HCT 45.3 01/04/2021   MCV 86.8 01/04/2021   PLT 109 (L) 01/04/2021     Chemistry      Component Value Date/Time   NA 139 01/04/2021 1149   NA 138 04/18/2016 1209   K 4.3 01/04/2021 1149   CL 103 01/04/2021 1149   CO2 28 01/04/2021 1149   BUN 24 (H) 01/04/2021 1149   BUN 18 04/18/2016 1209   CREATININE 1.24 01/04/2021 1149   CREATININE 1.27 10/26/2015 1114      Component Value Date/Time   CALCIUM 10.1 01/04/2021 1149   ALKPHOS 48 01/04/2021 1149   AST 27 01/04/2021 1149   ALT 32 01/04/2021 1149   BILITOT 0.6 01/04/2021 1149      Impression and Plan: Brian Lloyd is a very nice 60 year old white male.  He has a antiphospholipid antibody syndrome.  He is on lifelong Coumadin and low-dose aspirin.  Things are looking pretty good right now.  Again, he does his monitoring at home.  This certainly is easy for him.  The Coumadin Clinic helps [place] out.  I really do not see that we had to make any adjustments right now.  His lab work looks fine.  He has good renal function.  Right now, we will plan to get back in the springtime.  We will try to move his appointments out further further.  [name], MD 12/6/20229:54 AM

## 2021-01-21 DIAGNOSIS — M47812 Spondylosis without myelopathy or radiculopathy, cervical region: Secondary | ICD-10-CM | POA: Diagnosis not present

## 2021-06-03 ENCOUNTER — Inpatient Hospital Stay: Payer: No Typology Code available for payment source | Attending: Hematology & Oncology

## 2021-06-03 ENCOUNTER — Other Ambulatory Visit: Payer: Self-pay

## 2021-06-03 ENCOUNTER — Other Ambulatory Visit: Payer: Self-pay | Admitting: Hematology & Oncology

## 2021-06-03 ENCOUNTER — Inpatient Hospital Stay (HOSPITAL_BASED_OUTPATIENT_CLINIC_OR_DEPARTMENT_OTHER): Payer: No Typology Code available for payment source | Admitting: Hematology & Oncology

## 2021-06-03 ENCOUNTER — Encounter: Payer: Self-pay | Admitting: Hematology & Oncology

## 2021-06-03 VITALS — BP 133/89 | HR 69 | Temp 98.2°F | Resp 18 | Wt 178.0 lb

## 2021-06-03 DIAGNOSIS — D6861 Antiphospholipid syndrome: Secondary | ICD-10-CM

## 2021-06-03 DIAGNOSIS — Z86718 Personal history of other venous thrombosis and embolism: Secondary | ICD-10-CM | POA: Diagnosis present

## 2021-06-03 DIAGNOSIS — Z7901 Long term (current) use of anticoagulants: Secondary | ICD-10-CM | POA: Insufficient documentation

## 2021-06-03 DIAGNOSIS — I825Z2 Chronic embolism and thrombosis of unspecified deep veins of left distal lower extremity: Secondary | ICD-10-CM

## 2021-06-03 DIAGNOSIS — Z7982 Long term (current) use of aspirin: Secondary | ICD-10-CM | POA: Diagnosis not present

## 2021-06-03 DIAGNOSIS — I63119 Cerebral infarction due to embolism of unspecified vertebral artery: Secondary | ICD-10-CM

## 2021-06-03 LAB — CMP (CANCER CENTER ONLY)
ALT: 29 U/L (ref 0–44)
AST: 24 U/L (ref 15–41)
Albumin: 4.6 g/dL (ref 3.5–5.0)
Alkaline Phosphatase: 44 U/L (ref 38–126)
Anion gap: 6 (ref 5–15)
BUN: 24 mg/dL — ABNORMAL HIGH (ref 8–23)
CO2: 30 mmol/L (ref 22–32)
Calcium: 9.7 mg/dL (ref 8.9–10.3)
Chloride: 105 mmol/L (ref 98–111)
Creatinine: 1.25 mg/dL — ABNORMAL HIGH (ref 0.61–1.24)
GFR, Estimated: >60 mL/min (ref >60)
Glucose, Bld: 112 mg/dL — ABNORMAL HIGH (ref 70–99)
Potassium: 4.6 mmol/L (ref 3.5–5.1)
Sodium: 141 mmol/L (ref 135–145)
Total Bilirubin: 0.6 mg/dL (ref 0.3–1.2)
Total Protein: 7.7 g/dL (ref 6.5–8.1)

## 2021-06-03 LAB — CBC WITH DIFFERENTIAL (CANCER CENTER ONLY)
Abs Immature Granulocytes: 0.03 10*3/uL (ref 0.00–0.07)
Basophils Absolute: 0 10*3/uL (ref 0.0–0.1)
Basophils Relative: 1 %
Eosinophils Absolute: 0.1 10*3/uL (ref 0.0–0.5)
Eosinophils Relative: 2 %
HCT: 43.2 % (ref 39.0–52.0)
Hemoglobin: 15 g/dL (ref 13.0–17.0)
Immature Granulocytes: 1 %
Lymphocytes Relative: 22 %
Lymphs Abs: 1.4 10*3/uL (ref 0.7–4.0)
MCH: 30.3 pg (ref 26.0–34.0)
MCHC: 34.7 g/dL (ref 30.0–36.0)
MCV: 87.3 fL (ref 80.0–100.0)
Monocytes Absolute: 0.4 10*3/uL (ref 0.1–1.0)
Monocytes Relative: 7 %
Neutro Abs: 4.2 10*3/uL (ref 1.7–7.7)
Neutrophils Relative %: 67 %
Platelet Count: 97 10*3/uL — ABNORMAL LOW (ref 150–400)
RBC: 4.95 MIL/uL (ref 4.22–5.81)
RDW: 11.9 % (ref 11.5–15.5)
WBC Count: 6.2 10*3/uL (ref 4.0–10.5)
nRBC: 0 % (ref 0.0–0.2)

## 2021-06-03 LAB — PROTIME-INR
INR: 2.2 — ABNORMAL HIGH (ref 0.8–1.2)
Prothrombin Time: 24.2 seconds — ABNORMAL HIGH (ref 11.4–15.2)

## 2021-06-03 NOTE — Progress Notes (Signed)
?Hematology and Oncology Follow Up Visit ? ?[employer] ?[id number] ?[birth year] 61 y.o. ?06/03/2021 ? ? ?Principle Diagnosis:  ?Antiphospholipid antibody syndrome ? ?Current Therapy:   ?Lifelong Coumadin/low-dose aspirin --patient has home INR monitoring system.  Coumadin clinic is managing the INR. ?    ?Interim History:  Brian Lloyd is in for follow-up.  The problem that we now have is that he is having a lot of neck pain.  He has been followed by Dr. Shon Baton of [facility].  He had a recent MRI of the cervical spine.  Apparently, this showed a lot of problems at C5-C6.  I will have to get a copy of this report. ? ?He is going to need that I have epidural steroid that sounds like. ? ?He is on Coumadin.  He is on lifelong Coumadin and low-dose aspirin. ? ?We are going to have to adjust his Coumadin protocol for these injections. ? ?I am sure he will have to be on Lovenox as a "bridge" for the injections. ? ?Otherwise, he seems to be feeling okay.  He has had no bleeding.  He has had no cough or shortness of breath. ? ?I just feel bad he has had this issue with the neck. ? ?His appetite has been good.  He has had no nausea or vomiting.  There is no change in bowel or bladder habits. ? ?Overall, I would say his performance status is ECOG 1.   ? ?  ?Medications:  ?Current Outpatient Medications:  ?  acetaminophen (TYLENOL) 500 MG tablet, Take 1,000 mg by mouth every 6 (six) hours as needed for moderate pain or headache.  (Patient not taking: Reported on 01/05/2021), Disp: , Rfl:  ?  aspirin EC 81 MG tablet, Take 81 mg by mouth daily., Disp: , Rfl:  ?  HYDROcodone-acetaminophen (NORCO) 7.5-325 MG tablet, , Disp: , Rfl:  ?  Multiple Vitamin (MULTIVITAMINS PO), 1 tablet, Disp: , Rfl:  ?  psyllium (METAMUCIL) 58.6 % packet, Take 1 packet by mouth daily., Disp: , Rfl:  ?  Saw Palmetto, Serenoa repens, (SAW PALMETTO PO), Take 1 capsule by mouth 2 (two) times daily., Disp: , Rfl:  ?  tetrahydrozoline  0.05 % ophthalmic solution, 1 drop into affected eye as needed (Patient not taking: Reported on 01/05/2021), Disp: , Rfl:  ?  warfarin (COUMADIN) 5 MG tablet, TAKE 1 AND 1/2 TABLET ON MONDAYS AND THURSDAYS. ALL OTHER DAYS, TAKE ONLY ONE (1) TABLET., Disp: 100 tablet, Rfl: 1 ? ?Allergies:  ?Allergies  ?Allergen Reactions  ? Naproxen Anaphylaxis  ? ? ?Past Medical History, Surgical history, Social history, and Family History were reviewed and updated. ? ?Review of Systems: ?Review of Systems  ?Constitutional: Negative.   ?HENT:  Negative.    ?Eyes: Negative.   ?Respiratory: Negative.    ?Cardiovascular: Negative.   ?Gastrointestinal: Negative.   ?Endocrine: Negative.   ?Genitourinary: Negative.    ?Musculoskeletal:  Positive for back pain, myalgias and neck pain.  ?Skin: Negative.   ?Neurological:  Positive for dizziness.  ?Hematological:  Bruises/bleeds easily.  ?Psychiatric/Behavioral: Negative.    ? ?Physical Exam: ? weight is 178 lb (80.7 kg). His oral temperature is 98.2 ?F (36.8 ?C). His blood pressure is 133/89 and his pulse is 69. His respiration is 18 and oxygen saturation is 98%.  ? ?Wt Readings from Last 3 Encounters:  ?06/03/21 178 lb (80.7 kg)  ?01/05/21 180 lb (81.6 kg)  ?01/04/21 180 lb (81.6 kg)  ? ? ?Physical Exam ?Vitals reviewed.  ?  HENT:  ?   Head: Normocephalic and atraumatic.  ?Eyes:  ?   Pupils: Pupils are equal, round, and reactive to light.  ?Cardiovascular:  ?   Rate and Rhythm: Normal rate and regular rhythm.  ?   Heart sounds: Normal heart sounds.  ?Pulmonary:  ?   Effort: Pulmonary effort is normal.  ?   Breath sounds: Normal breath sounds.  ?Abdominal:  ?   General: Bowel sounds are normal.  ?   Palpations: Abdomen is soft.  ?Musculoskeletal:     ?   General: No tenderness or deformity. Normal range of motion.  ?   Cervical back: Normal range of motion.  ?Lymphadenopathy:  ?   Cervical: No cervical adenopathy.  ?Skin: ?   General: Skin is warm and dry.  ?   Findings: No erythema or rash.   ?Neurological:  ?   Mental Status: He is alert and oriented to person, place, and time.  ?Psychiatric:     ?   Behavior: Behavior normal.     ?   Thought Content: Thought content normal.     ?   Judgment: Judgment normal.  ? ? ? ?Lab Results  ?Component Value Date  ? WBC 6.2 06/03/2021  ? HGB 15.0 06/03/2021  ? HCT 43.2 06/03/2021  ? MCV 87.3 06/03/2021  ? PLT 97 (L) 06/03/2021  ? ?  Chemistry   ?   ?Component Value Date/Time  ? NA 139 01/04/2021 1149  ? NA 138 04/18/2016 1209  ? K 4.3 01/04/2021 1149  ? CL 103 01/04/2021 1149  ? CO2 28 01/04/2021 1149  ? BUN 24 (H) 01/04/2021 1149  ? BUN 18 04/18/2016 1209  ? CREATININE 1.24 01/04/2021 1149  ? CREATININE 1.27 10/26/2015 1114  ?    ?Component Value Date/Time  ? CALCIUM 10.1 01/04/2021 1149  ? ALKPHOS 48 01/04/2021 1149  ? AST 27 01/04/2021 1149  ? ALT 32 01/04/2021 1149  ? BILITOT 0.6 01/04/2021 1149  ?  ? ? ?Impression and Plan: ?Brian Lloyd is a very nice 61 year old white male.  He has a antiphospholipid antibody syndrome.  He is on lifelong Coumadin and low-dose aspirin. ? ?Again, going to have to think about Lovenox as a "bridge" during his epidural injections.  He is not sure when he will have this.  I am sure that [facility] will let us know. ? ?Again we will have to have him on Lovenox before and after the injections so that his Coumadin can get out of his system and then get back to being therapeutic. ? ?Otherwise, I think he is doing nicely.  I just really hate that he is having this issue with his neck. ? ?We will probably plan to get him back to see [place] in another few months. ? ?[name], MD ?5/4/202310:22 AM  ?

## 2021-06-09 ENCOUNTER — Encounter: Payer: Self-pay | Admitting: *Deleted

## 2021-06-14 ENCOUNTER — Ambulatory Visit: Payer: No Typology Code available for payment source | Admitting: Diagnostic Neuroimaging

## 2021-06-14 ENCOUNTER — Encounter: Payer: Self-pay | Admitting: Diagnostic Neuroimaging

## 2021-06-14 VITALS — BP 128/84 | HR 67 | Ht 68.0 in | Wt 179.0 lb

## 2021-06-14 DIAGNOSIS — F32A Depression, unspecified: Secondary | ICD-10-CM | POA: Diagnosis not present

## 2021-06-14 DIAGNOSIS — F419 Anxiety disorder, unspecified: Secondary | ICD-10-CM

## 2021-06-14 DIAGNOSIS — G43109 Migraine with aura, not intractable, without status migrainosus: Secondary | ICD-10-CM

## 2021-06-14 DIAGNOSIS — R413 Other amnesia: Secondary | ICD-10-CM

## 2021-06-14 MED ORDER — NURTEC 75 MG PO TBDP: 75.0000 mg | ORAL_TABLET | Freq: Every day | ORAL | 6 refills | Status: DC | PRN

## 2021-06-14 NOTE — Patient Instructions (Addendum)
?-   optimize mood, headache treatment ? ?- consider SSRI (sertraline) ? ?- consider psychology evaluation ? ?- start nurtec 75mg  as needed for migraine rescue (max 8 per month) ?
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
## 2021-06-15 ENCOUNTER — Telehealth: Payer: Self-pay | Admitting: *Deleted

## 2021-06-15 NOTE — Telephone Encounter (Signed)
Called [facility] help desk, spoke with Gi Or Norman to initiate nurtec PA. She stated I need to call [phone] for PA. Called # reached Rx benefits, was given website, they do not take PA over the phone. Went to [employer] site and submitted PA.  ?

## 2021-06-15 NOTE — Telephone Encounter (Signed)
Nurtec PA, Key: [id number], [id number]. received this message: ?[employer] does not manage PA for this patient. Please contact the number on the back of the members card for further assistance. Will call after 8 am, [phone]. ?

## 2021-06-15 NOTE — Telephone Encounter (Signed)
Received fax from Rx Benefits re: Nurtec approved from 06/15/21 to 06/15/2022. Questions, call [phone]. [place] # [id number] ?Approval letter faxed to pharmacy. ?

## 2021-07-19 ENCOUNTER — Inpatient Hospital Stay: Payer: No Typology Code available for payment source | Attending: Hematology & Oncology

## 2021-07-19 ENCOUNTER — Inpatient Hospital Stay: Payer: No Typology Code available for payment source | Admitting: Hematology & Oncology

## 2021-07-19 ENCOUNTER — Other Ambulatory Visit: Payer: Self-pay | Admitting: Lab

## 2021-07-19 ENCOUNTER — Encounter: Payer: Self-pay | Admitting: Hematology & Oncology

## 2021-07-19 VITALS — BP 140/84 | HR 66 | Temp 98.2°F | Resp 18 | Wt 176.0 lb

## 2021-07-19 DIAGNOSIS — Z7982 Long term (current) use of aspirin: Secondary | ICD-10-CM | POA: Insufficient documentation

## 2021-07-19 DIAGNOSIS — Z86718 Personal history of other venous thrombosis and embolism: Secondary | ICD-10-CM | POA: Diagnosis present

## 2021-07-19 DIAGNOSIS — Z7901 Long term (current) use of anticoagulants: Secondary | ICD-10-CM | POA: Insufficient documentation

## 2021-07-19 DIAGNOSIS — D6861 Antiphospholipid syndrome: Secondary | ICD-10-CM

## 2021-07-19 DIAGNOSIS — I825Z2 Chronic embolism and thrombosis of unspecified deep veins of left distal lower extremity: Secondary | ICD-10-CM

## 2021-07-19 LAB — PROTIME-INR
INR: 2.2 — ABNORMAL HIGH (ref 0.8–1.2)
Prothrombin Time: 24.5 seconds — ABNORMAL HIGH (ref 11.4–15.2)

## 2021-07-19 LAB — CMP (CANCER CENTER ONLY)
ALT: 27 U/L (ref 0–44)
AST: 22 U/L (ref 15–41)
Albumin: 4.4 g/dL (ref 3.5–5.0)
Alkaline Phosphatase: 46 U/L (ref 38–126)
Anion gap: 7 (ref 5–15)
BUN: 24 mg/dL — ABNORMAL HIGH (ref 8–23)
CO2: 27 mmol/L (ref 22–32)
Calcium: 9.7 mg/dL (ref 8.9–10.3)
Chloride: 107 mmol/L (ref 98–111)
Creatinine: 1.34 mg/dL — ABNORMAL HIGH (ref 0.61–1.24)
GFR, Estimated: 60 mL/min — ABNORMAL LOW (ref >60)
Glucose, Bld: 120 mg/dL — ABNORMAL HIGH (ref 70–99)
Potassium: 4.2 mmol/L (ref 3.5–5.1)
Sodium: 141 mmol/L (ref 135–145)
Total Bilirubin: 0.7 mg/dL (ref 0.3–1.2)
Total Protein: 7.7 g/dL (ref 6.5–8.1)

## 2021-07-19 LAB — CBC WITH DIFFERENTIAL (CANCER CENTER ONLY)
Abs Immature Granulocytes: 0.04 10*3/uL (ref 0.00–0.07)
Basophils Absolute: 0 10*3/uL (ref 0.0–0.1)
Basophils Relative: 1 %
Eosinophils Absolute: 0.1 10*3/uL (ref 0.0–0.5)
Eosinophils Relative: 2 %
HCT: 44.6 % (ref 39.0–52.0)
Hemoglobin: 15.3 g/dL (ref 13.0–17.0)
Immature Granulocytes: 1 %
Lymphocytes Relative: 16 %
Lymphs Abs: 1 10*3/uL (ref 0.7–4.0)
MCH: 30 pg (ref 26.0–34.0)
MCHC: 34.3 g/dL (ref 30.0–36.0)
MCV: 87.5 fL (ref 80.0–100.0)
Monocytes Absolute: 0.4 10*3/uL (ref 0.1–1.0)
Monocytes Relative: 6 %
Neutro Abs: 4.7 10*3/uL (ref 1.7–7.7)
Neutrophils Relative %: 74 %
Platelet Count: 99 10*3/uL — ABNORMAL LOW (ref 150–400)
RBC: 5.1 MIL/uL (ref 4.22–5.81)
RDW: 11.9 % (ref 11.5–15.5)
WBC Count: 6.3 10*3/uL (ref 4.0–10.5)
nRBC: 0 % (ref 0.0–0.2)

## 2021-07-19 NOTE — Progress Notes (Signed)
Hematology and Oncology Follow Up Visit  Brian Lloyd [id number] 1961-01-05 61 y.o. 07/19/2021   Principle Diagnosis:  Antiphospholipid antibody syndrome  Current Therapy:   Lifelong Coumadin/low-dose aspirin --patient has home INR monitoring system.  Coumadin clinic is managing the INR.     Interim History:  Brian Lloyd is in for follow-up.  He still has not had injections for the neck issue.  I really think he is going need to have the neck operated on.  I do not see a problem with him having surgery.  I realize that he has the hypercoagulable issue.  However, we cannot get him through surgery with anticoagulation.  We can have him on Lovenox for a while.  Again, his quality of life clearly is being dictated by his neck.  I will have to speak with Dr. Shon Baton to see if we cannot move things along with respect to him having surgery.  Otherwise, he injured his the index finger on the right hand.  Again, there was no bleeding with this.  He is on Coumadin.  I am awaiting the PT/INR.  He has had no cough or shortness of breath.  He has had no nausea or vomiting.  There is been no change in bowel or bladder habits.  He has had no leg swelling.  Overall, his performance status is probably ECOG 0.      Medications:  Current Outpatient Medications:    acetaminophen (TYLENOL) 500 MG tablet, Take 1,000 mg by mouth every 6 (six) hours as needed for moderate pain or headache., Disp: , Rfl:    aspirin EC 81 MG tablet, Take 81 mg by mouth daily., Disp: , Rfl:    HYDROcodone-acetaminophen (NORCO) 7.5-325 MG tablet, , Disp: , Rfl:    Multiple Vitamin (MULTIVITAMINS PO), 1 tablet, Disp: , Rfl:    psyllium (METAMUCIL) 58.6 % packet, Take 1 packet by mouth daily., Disp: , Rfl:    Rimegepant Sulfate (NURTEC) 75 MG TBDP, Take 75 mg by mouth daily as needed., Disp: 8 tablet, Rfl: 6   Saw Palmetto, Serenoa repens, (SAW PALMETTO PO), Take 1 capsule by mouth 2 (two) times daily., Disp: , Rfl:     tetrahydrozoline 0.05 % ophthalmic solution, , Disp: , Rfl:    warfarin (COUMADIN) 5 MG tablet, TAKE 1 AND 1/2 TABLET ON MONDAYS AND THURSDAYS. ALL OTHER DAYS, TAKE ONLY ONE (1) TABLET. (Patient taking differently: TAKE 1 AND 1/2 TABLET ON MONDAYS, WEDNESDAY AND FRIDAY. ALL OTHER DAYS, TAKE ONLY ONE (1) TABLET.), Disp: 100 tablet, Rfl: 1  Allergies:  Allergies  Allergen Reactions   Naproxen Anaphylaxis    Past Medical History, Surgical history, Social history, and Family History were reviewed and updated.  Review of Systems: Review of Systems  Constitutional: Negative.   HENT:  Negative.    Eyes: Negative.   Respiratory: Negative.    Cardiovascular: Negative.   Gastrointestinal: Negative.   Endocrine: Negative.   Genitourinary: Negative.    Musculoskeletal:  Positive for back pain, myalgias and neck pain.  Skin: Negative.   Neurological:  Positive for dizziness.  Hematological:  Bruises/bleeds easily.  Psychiatric/Behavioral: Negative.      Physical Exam:  weight is 176 lb 0.6 oz (79.9 kg). His oral temperature is 98.2 F (36.8 C). His blood pressure is 140/84 and his pulse is 66. His respiration is 18 and oxygen saturation is 98%.   Wt Readings from Last 3 Encounters:  07/19/21 176 lb 0.6 oz (79.9 kg)  06/14/21 179 lb (81.2 kg)  06/03/21 178 lb (80.7 kg)    Physical Exam Vitals reviewed.  HENT:     Head: Normocephalic and atraumatic.  Eyes:     Pupils: Pupils are equal, round, and reactive to light.  Cardiovascular:     Rate and Rhythm: Normal rate and regular rhythm.     Heart sounds: Normal heart sounds.  Pulmonary:     Effort: Pulmonary effort is normal.     Breath sounds: Normal breath sounds.  Abdominal:     General: Bowel sounds are normal.     Palpations: Abdomen is soft.  Musculoskeletal:        General: No tenderness or deformity. Normal range of motion.     Cervical back: Normal range of motion.  Lymphadenopathy:     Cervical: No cervical  adenopathy.  Skin:    General: Skin is warm and dry.     Findings: No erythema or rash.  Neurological:     Mental Status: He is alert and oriented to person, place, and time.  Psychiatric:        Behavior: Behavior normal.        Thought Content: Thought content normal.        Judgment: Judgment normal.      Lab Results  Component Value Date   WBC 6.3 07/19/2021   HGB 15.3 07/19/2021   HCT 44.6 07/19/2021   MCV 87.5 07/19/2021   PLT 99 (L) 07/19/2021     Chemistry      Component Value Date/Time   NA 141 06/03/2021 0949   NA 138 04/18/2016 1209   K 4.6 06/03/2021 0949   CL 105 06/03/2021 0949   CO2 30 06/03/2021 0949   BUN 24 (H) 06/03/2021 0949   BUN 18 04/18/2016 1209   CREATININE 1.25 (H) 06/03/2021 0949   CREATININE 1.27 10/26/2015 1114      Component Value Date/Time   CALCIUM 9.7 06/03/2021 0949   ALKPHOS 44 06/03/2021 0949   AST 24 06/03/2021 0949   ALT 29 06/03/2021 0949   BILITOT 0.6 06/03/2021 0949      Impression and Plan: Brian Lloyd is a very nice 61 year old white male.  He has a antiphospholipid antibody syndrome.  He is on lifelong Coumadin and low-dose aspirin.  Again, his neck is his biggest issue.  This is the determinant of his quality of life.  It sounds like he really needs to have this fixed.  Again I do not see a problem with him having surgery.  We can have him on perioperative Lovenox and then switch back over to Coumadin.  I think this would be safe and effective.  We will have to plan to get him back to see [place] in another couple months.  I am sure that we will be in contact with him sooner if Dr. Shon Baton can operate on him.  Again I would have him on Lovenox for a while until we know everything is stable and then get him back onto Coumadin.  6/19/202310:20 AM

## 2021-08-25 ENCOUNTER — Other Ambulatory Visit: Payer: Self-pay | Admitting: *Deleted

## 2021-08-25 DIAGNOSIS — I63119 Cerebral infarction due to embolism of unspecified vertebral artery: Secondary | ICD-10-CM

## 2021-08-25 DIAGNOSIS — D6861 Antiphospholipid syndrome: Secondary | ICD-10-CM

## 2021-08-25 MED ORDER — WARFARIN SODIUM 5 MG PO TABS: ORAL_TABLET | ORAL | 1 refills | Status: DC

## 2021-09-15 ENCOUNTER — Other Ambulatory Visit: Payer: Self-pay

## 2021-09-15 ENCOUNTER — Encounter: Payer: Self-pay | Admitting: Hematology & Oncology

## 2021-09-15 ENCOUNTER — Inpatient Hospital Stay: Payer: No Typology Code available for payment source | Admitting: Hematology & Oncology

## 2021-09-15 ENCOUNTER — Inpatient Hospital Stay: Payer: No Typology Code available for payment source | Attending: Hematology & Oncology

## 2021-09-15 VITALS — BP 137/86 | HR 64 | Temp 98.2°F | Resp 18 | Ht 68.0 in | Wt 178.4 lb

## 2021-09-15 DIAGNOSIS — Z7901 Long term (current) use of anticoagulants: Secondary | ICD-10-CM | POA: Diagnosis not present

## 2021-09-15 DIAGNOSIS — I825Z2 Chronic embolism and thrombosis of unspecified deep veins of left distal lower extremity: Secondary | ICD-10-CM

## 2021-09-15 DIAGNOSIS — D6861 Antiphospholipid syndrome: Secondary | ICD-10-CM | POA: Diagnosis present

## 2021-09-15 DIAGNOSIS — Z86718 Personal history of other venous thrombosis and embolism: Secondary | ICD-10-CM | POA: Insufficient documentation

## 2021-09-15 DIAGNOSIS — Z7982 Long term (current) use of aspirin: Secondary | ICD-10-CM | POA: Diagnosis not present

## 2021-09-15 LAB — CMP (CANCER CENTER ONLY)
ALT: 24 U/L (ref 0–44)
AST: 21 U/L (ref 15–41)
Albumin: 4.4 g/dL (ref 3.5–5.0)
Alkaline Phosphatase: 47 U/L (ref 38–126)
Anion gap: 7 (ref 5–15)
BUN: 23 mg/dL (ref 8–23)
CO2: 28 mmol/L (ref 22–32)
Calcium: 9.4 mg/dL (ref 8.9–10.3)
Chloride: 105 mmol/L (ref 98–111)
Creatinine: 1.27 mg/dL — ABNORMAL HIGH (ref 0.61–1.24)
GFR, Estimated: >60 mL/min (ref >60)
Glucose, Bld: 112 mg/dL — ABNORMAL HIGH (ref 70–99)
Potassium: 4.5 mmol/L (ref 3.5–5.1)
Sodium: 140 mmol/L (ref 135–145)
Total Bilirubin: 0.6 mg/dL (ref 0.3–1.2)
Total Protein: 7.7 g/dL (ref 6.5–8.1)

## 2021-09-15 LAB — CBC WITH DIFFERENTIAL (CANCER CENTER ONLY)
Abs Immature Granulocytes: 0.02 10*3/uL (ref 0.00–0.07)
Basophils Absolute: 0 10*3/uL (ref 0.0–0.1)
Basophils Relative: 1 %
Eosinophils Absolute: 0.1 10*3/uL (ref 0.0–0.5)
Eosinophils Relative: 2 %
HCT: 42.1 % (ref 39.0–52.0)
Hemoglobin: 14.5 g/dL (ref 13.0–17.0)
Immature Granulocytes: 0 %
Lymphocytes Relative: 23 %
Lymphs Abs: 1.1 10*3/uL (ref 0.7–4.0)
MCH: 30 pg (ref 26.0–34.0)
MCHC: 34.4 g/dL (ref 30.0–36.0)
MCV: 87.2 fL (ref 80.0–100.0)
Monocytes Absolute: 0.4 10*3/uL (ref 0.1–1.0)
Monocytes Relative: 7 %
Neutro Abs: 3.1 10*3/uL (ref 1.7–7.7)
Neutrophils Relative %: 67 %
Platelet Count: 76 10*3/uL — ABNORMAL LOW (ref 150–400)
RBC: 4.83 MIL/uL (ref 4.22–5.81)
RDW: 12.1 % (ref 11.5–15.5)
WBC Count: 4.7 10*3/uL (ref 4.0–10.5)
nRBC: 0 % (ref 0.0–0.2)

## 2021-09-15 LAB — PROTIME-INR
INR: 2.4 — ABNORMAL HIGH (ref 0.8–1.2)
Prothrombin Time: 26.1 seconds — ABNORMAL HIGH (ref 11.4–15.2)

## 2021-09-15 NOTE — Progress Notes (Signed)
Hematology and Oncology Follow Up Visit  Brian Lloyd [id number] 08/05/1960 61 y.o. 09/15/2021   Principle Diagnosis:  Antiphospholipid antibody syndrome  Current Therapy:   Lifelong Coumadin/low-dose aspirin --patient has home INR monitoring system.  Coumadin clinic is managing the INR.     Interim History:  Mr. Snowdon is in for follow-up.  He is doing okay.  His neck still is causing a lot of problems for him.  He is not sure if he wants to have neck surgery.  I think that he is going to possibly have surgery maybe in November.  He does see Dr. Shon Baton of Orthopedic Surgery.  Otherwise, he is doing well.  His INR today was 2.9.  He is on Coumadin at I think 7.5 alternating with 5 mg.  He has had no problems with cough or shortness of breath.  He has had no nausea or vomiting.  He has had no change in bowel or bladder habits.  He has had no problems with leg swelling.  There is been no bleeding.  Overall, his performance status is ECOG 1.     Medications:  Current Outpatient Medications:    acetaminophen (TYLENOL) 500 MG tablet, Take 1,000 mg by mouth every 6 (six) hours as needed for moderate pain or headache., Disp: , Rfl:    aspirin EC 81 MG tablet, Take 81 mg by mouth daily., Disp: , Rfl:    HYDROcodone-acetaminophen (NORCO) 7.5-325 MG tablet, , Disp: , Rfl:    Multiple Vitamin (MULTIVITAMINS PO), 1 tablet, Disp: , Rfl:    psyllium (METAMUCIL) 58.6 % packet, Take 1 packet by mouth daily., Disp: , Rfl:    Rimegepant Sulfate (NURTEC) 75 MG TBDP, Take 75 mg by mouth daily as needed., Disp: 8 tablet, Rfl: 6   Saw Palmetto, Serenoa repens, (SAW PALMETTO PO), Take 1 capsule by mouth 2 (two) times daily., Disp: , Rfl:    tetrahydrozoline 0.05 % ophthalmic solution, , Disp: , Rfl:    warfarin (COUMADIN) 5 MG tablet, TAKE 1 AND 1/2 TABLET ON MONDAYS, WEDNESDAY AND FRIDAY. ALL OTHER DAYS, TAKE ONLY ONE (1) TABLET., Disp: 100 tablet, Rfl: 1  Allergies:  Allergies  Allergen  Reactions   Naproxen Anaphylaxis    Past Medical History, Surgical history, Social history, and Family History were reviewed and updated.  Review of Systems: Review of Systems  Constitutional: Negative.   HENT:  Negative.    Eyes: Negative.   Respiratory: Negative.    Cardiovascular: Negative.   Gastrointestinal: Negative.   Endocrine: Negative.   Genitourinary: Negative.    Musculoskeletal:  Positive for back pain, myalgias and neck pain.  Skin: Negative.   Neurological:  Positive for dizziness.  Hematological:  Bruises/bleeds easily.  Psychiatric/Behavioral: Negative.      Physical Exam:  height is 5\' 8"  (1.727 m) and weight is 178 lb 6.4 oz (80.9 kg). His oral temperature is 98.2 F (36.8 C). His blood pressure is 137/86 and his pulse is 64. His respiration is 18 and oxygen saturation is 100%.   Wt Readings from Last 3 Encounters:  09/15/21 178 lb 6.4 oz (80.9 kg)  07/19/21 176 lb 0.6 oz (79.9 kg)  06/14/21 179 lb (81.2 kg)    Physical Exam Vitals reviewed.  HENT:     Head: Normocephalic and atraumatic.  Eyes:     Pupils: Pupils are equal, round, and reactive to light.  Cardiovascular:     Rate and Rhythm: Normal rate and regular rhythm.     Heart sounds:  Normal heart sounds.  Pulmonary:     Effort: Pulmonary effort is normal.     Breath sounds: Normal breath sounds.  Abdominal:     General: Bowel sounds are normal.     Palpations: Abdomen is soft.  Musculoskeletal:        General: No tenderness or deformity. Normal range of motion.     Cervical back: Normal range of motion.  Lymphadenopathy:     Cervical: No cervical adenopathy.  Skin:    General: Skin is warm and dry.     Findings: No erythema or rash.  Neurological:     Mental Status: He is alert and oriented to person, place, and time.  Psychiatric:        Behavior: Behavior normal.        Thought Content: Thought content normal.        Judgment: Judgment normal.      Lab Results  Component  Value Date   WBC 4.7 09/15/2021   HGB 14.5 09/15/2021   HCT 42.1 09/15/2021   MCV 87.2 09/15/2021   PLT 76 (L) 09/15/2021     Chemistry      Component Value Date/Time   NA 140 09/15/2021 0928   NA 138 04/18/2016 1209   K 4.5 09/15/2021 0928   CL 105 09/15/2021 0928   CO2 28 09/15/2021 0928   BUN 23 09/15/2021 0928   BUN 18 04/18/2016 1209   CREATININE 1.27 (H) 09/15/2021 0928   CREATININE 1.27 10/26/2015 1114      Component Value Date/Time   CALCIUM 9.4 09/15/2021 0928   ALKPHOS 47 09/15/2021 0928   AST 21 09/15/2021 0928   ALT 24 09/15/2021 0928   BILITOT 0.6 09/15/2021 0928      Impression and Plan: Mr. Piercefield is a very nice 61 year old white male.  He has a antiphospholipid antibody syndrome.  He is on lifelong Coumadin and low-dose aspirin.  I noted that his platelet count is a little bit lower.  This also will need to be followed.  Again, from my point of view, I do not see a problem with him having neck surgery.  We just have to get him off Coumadin and onto Lovenox.  This probably would be about a 4 or 5-day transition.  I would like to see him back in October.  Maybe, at that point, will be a better idea as to whether or not he will have surgery.   8/16/202310:31 AM

## 2021-11-17 ENCOUNTER — Inpatient Hospital Stay: Payer: No Typology Code available for payment source | Attending: Hematology & Oncology

## 2021-11-17 ENCOUNTER — Inpatient Hospital Stay: Payer: No Typology Code available for payment source | Admitting: Hematology & Oncology

## 2021-11-17 ENCOUNTER — Other Ambulatory Visit: Payer: Self-pay

## 2021-11-17 ENCOUNTER — Encounter: Payer: Self-pay | Admitting: Hematology & Oncology

## 2021-11-17 VITALS — BP 114/76 | HR 72 | Temp 97.6°F | Resp 18 | Ht 68.0 in | Wt 174.0 lb

## 2021-11-17 DIAGNOSIS — I825Z2 Chronic embolism and thrombosis of unspecified deep veins of left distal lower extremity: Secondary | ICD-10-CM

## 2021-11-17 DIAGNOSIS — D6861 Antiphospholipid syndrome: Secondary | ICD-10-CM

## 2021-11-17 DIAGNOSIS — Z7901 Long term (current) use of anticoagulants: Secondary | ICD-10-CM | POA: Insufficient documentation

## 2021-11-17 DIAGNOSIS — Z86718 Personal history of other venous thrombosis and embolism: Secondary | ICD-10-CM | POA: Diagnosis present

## 2021-11-17 DIAGNOSIS — Z7982 Long term (current) use of aspirin: Secondary | ICD-10-CM | POA: Diagnosis not present

## 2021-11-17 LAB — SAVE SMEAR(SSMR), FOR PROVIDER SLIDE REVIEW

## 2021-11-17 LAB — CBC WITH DIFFERENTIAL (CANCER CENTER ONLY)
Abs Immature Granulocytes: 0.02 10*3/uL (ref 0.00–0.07)
Basophils Absolute: 0 10*3/uL (ref 0.0–0.1)
Basophils Relative: 1 %
Eosinophils Absolute: 0.1 10*3/uL (ref 0.0–0.5)
Eosinophils Relative: 3 %
HCT: 45.8 % (ref 39.0–52.0)
Hemoglobin: 15.6 g/dL (ref 13.0–17.0)
Immature Granulocytes: 0 %
Lymphocytes Relative: 21 %
Lymphs Abs: 1.1 10*3/uL (ref 0.7–4.0)
MCH: 30.1 pg (ref 26.0–34.0)
MCHC: 34.1 g/dL (ref 30.0–36.0)
MCV: 88.2 fL (ref 80.0–100.0)
Monocytes Absolute: 0.3 10*3/uL (ref 0.1–1.0)
Monocytes Relative: 5 %
Neutro Abs: 3.7 10*3/uL (ref 1.7–7.7)
Neutrophils Relative %: 70 %
Platelet Count: 121 10*3/uL — ABNORMAL LOW (ref 150–400)
RBC: 5.19 MIL/uL (ref 4.22–5.81)
RDW: 12.3 % (ref 11.5–15.5)
WBC Count: 5.2 10*3/uL (ref 4.0–10.5)
nRBC: 0 % (ref 0.0–0.2)

## 2021-11-17 LAB — CMP (CANCER CENTER ONLY)
ALT: 27 U/L (ref 0–44)
AST: 25 U/L (ref 15–41)
Albumin: 4.6 g/dL (ref 3.5–5.0)
Alkaline Phosphatase: 54 U/L (ref 38–126)
Anion gap: 8 (ref 5–15)
BUN: 24 mg/dL — ABNORMAL HIGH (ref 8–23)
CO2: 28 mmol/L (ref 22–32)
Calcium: 10 mg/dL (ref 8.9–10.3)
Chloride: 105 mmol/L (ref 98–111)
Creatinine: 1.46 mg/dL — ABNORMAL HIGH (ref 0.61–1.24)
GFR, Estimated: 54 mL/min — ABNORMAL LOW (ref >60)
Glucose, Bld: 127 mg/dL — ABNORMAL HIGH (ref 70–99)
Potassium: 4.5 mmol/L (ref 3.5–5.1)
Sodium: 141 mmol/L (ref 135–145)
Total Bilirubin: 0.6 mg/dL (ref 0.3–1.2)
Total Protein: 7.9 g/dL (ref 6.5–8.1)

## 2021-11-17 LAB — PROTIME-INR
INR: 1.9 — ABNORMAL HIGH (ref 0.8–1.2)
Prothrombin Time: 21.3 seconds — ABNORMAL HIGH (ref 11.4–15.2)

## 2021-11-17 NOTE — Progress Notes (Signed)
Hematology and Oncology Follow Up Visit  Brian Lloyd [id number] 03-Jul-1960 61 y.o. 11/17/2021   Principle Diagnosis:  Antiphospholipid antibody syndrome  Current Therapy:   Lifelong Coumadin/low-dose aspirin --patient has home INR monitoring system.  Coumadin clinic is managing the INR.     Interim History:  Brian Lloyd is in for follow-up.  He is doing quite well.  He and his wife are down in [place] for a conference.  He really enjoyed himself.  He is going to a physician in [place].  This physician does cervical spine work.  They is not a Psychologist, sport and exercise.  It sounds like things are working pretty well for Brian Lloyd.  He had a birthday 3 days ago.  He had a very nice birthday.  He has had no bleeding.  Coumadin has been pretty therapeutic.  He said yesterday the INR was 2.7.  He has had no change in bowel or bladder habits.  He has had no leg swelling or pain.  He has had no cough or shortness of breath.  He has had no headache.  Overall, I would say that his performance status is probably ECOG 0.       Medications:  Current Outpatient Medications:    acetaminophen (TYLENOL) 500 MG tablet, Take 1,000 mg by mouth every 6 (six) hours as needed for moderate pain or headache., Disp: , Rfl:    aspirin EC 81 MG tablet, Take 81 mg by mouth daily., Disp: , Rfl:    HYDROcodone-acetaminophen (NORCO) 7.5-325 MG tablet, , Disp: , Rfl:    Multiple Vitamin (MULTIVITAMINS PO), 1 tablet, Disp: , Rfl:    psyllium (METAMUCIL) 58.6 % packet, Take 1 packet by mouth daily., Disp: , Rfl:    Rimegepant Sulfate (NURTEC) 75 MG TBDP, Take 75 mg by mouth daily as needed., Disp: 8 tablet, Rfl: 6   Saw Palmetto, Serenoa repens, (SAW PALMETTO PO), Take 1 capsule by mouth 2 (two) times daily., Disp: , Rfl:    tetrahydrozoline 0.05 % ophthalmic solution, , Disp: , Rfl:    warfarin (COUMADIN) 5 MG tablet, TAKE 1 AND 1/2 TABLET ON MONDAYS, WEDNESDAY AND FRIDAY. ALL OTHER DAYS, TAKE ONLY ONE (1)  TABLET., Disp: 100 tablet, Rfl: 1  Allergies:  Allergies  Allergen Reactions   Naproxen Anaphylaxis    Past Medical History, Surgical history, Social history, and Family History were reviewed and updated.  Review of Systems: Review of Systems  Constitutional: Negative.   HENT:  Negative.    Eyes: Negative.   Respiratory: Negative.    Cardiovascular: Negative.   Gastrointestinal: Negative.   Endocrine: Negative.   Genitourinary: Negative.    Musculoskeletal:  Positive for back pain, myalgias and neck pain.  Skin: Negative.   Neurological:  Positive for dizziness.  Hematological:  Bruises/bleeds easily.  Psychiatric/Behavioral: Negative.      Physical Exam:  height is 5\' 8"  (1.727 m) and weight is 174 lb (78.9 kg). His oral temperature is 97.6 F (36.4 C). His blood pressure is 114/76 and his pulse is 72. His respiration is 18 and oxygen saturation is 99%.   Wt Readings from Last 3 Encounters:  11/17/21 174 lb (78.9 kg)  09/15/21 178 lb 6.4 oz (80.9 kg)  07/19/21 176 lb 0.6 oz (79.9 kg)    Physical Exam Vitals reviewed.  HENT:     Head: Normocephalic and atraumatic.  Eyes:     Pupils: Pupils are equal, round, and reactive to light.  Cardiovascular:     Rate and Rhythm: Normal  rate and regular rhythm.     Heart sounds: Normal heart sounds.  Pulmonary:     Effort: Pulmonary effort is normal.     Breath sounds: Normal breath sounds.  Abdominal:     General: Bowel sounds are normal.     Palpations: Abdomen is soft.  Musculoskeletal:        General: No tenderness or deformity. Normal range of motion.     Cervical back: Normal range of motion.  Lymphadenopathy:     Cervical: No cervical adenopathy.  Skin:    General: Skin is warm and dry.     Findings: No erythema or rash.  Neurological:     Mental Status: He is alert and oriented to person, place, and time.  Psychiatric:        Behavior: Behavior normal.        Thought Content: Thought content normal.         Judgment: Judgment normal.      Lab Results  Component Value Date   WBC 5.2 11/17/2021   HGB 15.6 11/17/2021   HCT 45.8 11/17/2021   MCV 88.2 11/17/2021   PLT 121 (L) 11/17/2021     Chemistry      Component Value Date/Time   NA 141 11/17/2021 0944   NA 138 04/18/2016 1209   K 4.5 11/17/2021 0944   CL 105 11/17/2021 0944   CO2 28 11/17/2021 0944   BUN 24 (H) 11/17/2021 0944   BUN 18 04/18/2016 1209   CREATININE 1.46 (H) 11/17/2021 0944   CREATININE 1.27 10/26/2015 1114      Component Value Date/Time   CALCIUM 10.0 11/17/2021 0944   ALKPHOS 54 11/17/2021 0944   AST 25 11/17/2021 0944   ALT 27 11/17/2021 0944   BILITOT 0.6 11/17/2021 0944      Impression and Plan: Brian Lloyd is a very nice 61 year old white male.  He has a antiphospholipid antibody syndrome.  He is on lifelong Coumadin and low-dose aspirin.  I am happy that his platelet count is better.  This continues to trend upward which is a nice finding.  Again, looks like the neck surgery will not have to be done.  I am happy about that.  He really did not wish to have surgery.  We will plan to get him back in 3 more months.  I think this is a very reasonable time period.    10/18/202310:32 AM

## 2021-12-31 ENCOUNTER — Telehealth: Payer: Self-pay | Admitting: Pharmacist

## 2021-12-31 ENCOUNTER — Telehealth: Payer: Self-pay | Admitting: *Deleted

## 2021-12-31 ENCOUNTER — Encounter: Payer: Self-pay | Admitting: Internal Medicine

## 2021-12-31 NOTE — Telephone Encounter (Signed)
I was out of the hospital today at [facility] where I am a faculty. I received a call at 10:12 am from [facility]--who, after the patient uploads their own value, this company alerts the provider of record on their document of any out of range INRs. The company apprised me that this patient's INR was 5.7 as reported by the patient at to their company this morning. The patient had been seen by Dr. Cephas Darby who retired from our practice. My last visit with the patient was in August 2022 at which time the patient was apprised that because he was not seeing a PCP in the [facility] that I could no longer provide anticoagulation management oversight. I suspect that the routing of the fax data and calls to me by [employer] [facility] [employer] stem from the fact that the original point of care fingerstick patient self-testing application to this company listed Dr., Jackquline Berlin was at one point his PCP provider in the [facility]. She left after finishing her IM Program. Mr. Rogelia Boga indicated in a text to me on October 02, 2020, at 0802h that "in other news, I will be contacting Dr. Arlan Organ at [facility] to see if he will see me as a patient given that Dr. Cyndie Chime has retired." He then texted on October 02, 2020, at 1141h that he had spoken with Elmarie Shiley at [facility] "who said you (me/JBG) would need to send a consult request to Dr. Myna Hidalgo to set up an appointment to transfer my care." On October 02, 2020, the following entry by Dr. Mikey Bussing was made as well as a consult request to Dr. Myna Hidalgo: "Spoke with Dr Alexandria Lodge, patient needing referral to hematology. Patient historically followed by Dr Cyndie Chime has continued to see Dr Alexandria Lodge for his anticoagulation now needing to transfer care to physician. Dr Cyndie Chime had historically requested INR range of 2.5-3.5." The patient was called by Dr.  Myna Hidalgo' s support staff on October 02, 2020, at 1257h to establish him as a patient.  I have reviewed notes documented by Dr. Myna Hidalgo. He mentions that "The Coumadin Clinic gets his results and helps him monitor the Coumadin dosing." Unfortunately, with the patient receiving his primary care by Dr. Lenise Arena [facility]) I cannot see the patient based upon the policy and procedures of the [facility] as well as per my credentialing by [facility] that stipulates my sponsoring physicians as an Advanced Practice Provider (Clinical Pharmacist Practitioner) is by the Internal Medicine Teaching Service Faculty Attending Physicians only. Review of notes reveals that warfarin refill authorizations have been per Dr. Myna Hidalgo' s office. Two notes today by Cleatis Polka. Tobey, RN reveals that she has called the patient twice advising no warfarin and to report to the ED if bleeding were to ensue. I called the patient tonight from home at 7:29pm to see if he had contacted Dr. Myna Hidalgo' s office as I had instructed him, and he indicated he had not. We discussed the bleeding that he had mentioned when I spoke to him at approximately 1015h earlier today. He indicates that there had, "earlier in the week, been some blood on the tissue paper after I had to strain to have a constipate stool." He denies any gross bleeding of bright red blood or dark tarry stools per rectum. Regarding his self-reporting of a nosebleed, he stated "It was some specks of blood on the Kleenex that I attribute  to the dry air." Given these patient self-reports in the setting of an INR of 5.7 on a regimen that he states he has been taking as:  1 &  x 5mg  on MWF; 1x5mg  all other days." the patient was instructed to OMIT todays dose of warfarin (as he had been instructed in our 1215h call as well as by Dr. ' s nurses documentation as well). Additionally, I instructed him to go tonight to a [facility], or a convenience store or the grocery store  and purchase two bottles of Lipton's Green Tea and consume these two approximate eight ounce bottles that would provide a total of approximately 188 micrograms of vitamin K1. Patient was again reminded to repeat patient self-testing, point of care, finger stick INR on Saturday 2-DEC-23. I will on this occasion and with co-signatory of Dr. [id number], be responsible for managing this clinical scenario over the weekend. Will need to discuss further on Monday with Dr. Sunday to align future management of warfarin. Myna Hidalgo, PharmD, CPP, RPh Clinical Pharmacist Practitioner Professor of Pharmacy, [facility] Clinical Assistant Professor of Medicine, [facility]

## 2021-12-31 NOTE — Telephone Encounter (Signed)
Call received from [facility] L[facility] to notify Dr. Myna Hidalgo that pt.'s INR was 5.7 from a fax that he received today from an at home INR monitor. Fayrene Fearing stated that he has spoken with pt and that pt states he has had a slight nose bleed and some blood in his stool.  James instructed pt to contact this office ASAP.  At this time, no call has been received from patient.  Dr. Myna Hidalgo notified.

## 2021-12-31 NOTE — Telephone Encounter (Signed)
Call placed to patient and message left asking pt to call office back immediately regarding elevated INR.

## 2021-12-31 NOTE — Progress Notes (Unsigned)
Received a self-testing fax of INR of 5.7.  Reached out to Dr Alexandria Lodge, this patient was previous a patient of Dr Cyndie Chime that has now been transferred to Dr Myna Hidalgo,  Dr Alexandria Lodge was also called about this result and called the patient, who noted some blood in stool and slight nose bleed. He instructed patient to call Dr Gustavo Lah office now.  He has also called Dr Gustavo Lah office as well.  He does not currently have access to Epic.

## 2021-12-31 NOTE — Telephone Encounter (Signed)
Second call placed to patient and message left requesting pt to contact on call MD regarding elevated INR.  Pt instructed to go immediately to the ER with any bleeding and to hold Coumadin until he speaks with a medical provider.

## 2022-01-01 ENCOUNTER — Telehealth: Payer: Self-pay | Admitting: Pharmacist

## 2022-01-01 NOTE — Telephone Encounter (Signed)
Patient provided PST FS POC INR this morning after omitting warfarin dose of 1-DEC-23 and two bottles of Lipton's Green tea (total amount 4.7 vitamin K1 approximately 188 micrograms.)  INR = 4.6. Was advised to again OMIT today's warfarin dose and repeat INR on 3-DEC-23.

## 2022-01-02 ENCOUNTER — Telehealth: Payer: Self-pay | Admitting: Pharmacist

## 2022-01-02 NOTE — Telephone Encounter (Signed)
Patient performed patient self testing finger stick point of care INR this morning and reported to me via text result of 2.4 (target range 2.5 - 3.5) after omitting second dose of warfarin yesterday when his INR was 4.6. Patient was advised to recommence warfarin today with 1 & 1/2 x 5 mg  (7.5 mg) dose today and one (1) x 5 mg tablet on Monday 4-DEC-23 and repeat  INR on Tuesday 5-DEC-23.

## 2022-01-05 ENCOUNTER — Telehealth: Payer: Self-pay | Admitting: Pharmacist

## 2022-01-05 ENCOUNTER — Inpatient Hospital Stay: Payer: No Typology Code available for payment source | Attending: Hematology & Oncology

## 2022-01-05 ENCOUNTER — Telehealth: Payer: Self-pay

## 2022-01-05 ENCOUNTER — Other Ambulatory Visit: Payer: Self-pay

## 2022-01-05 DIAGNOSIS — D6861 Antiphospholipid syndrome: Secondary | ICD-10-CM | POA: Insufficient documentation

## 2022-01-05 DIAGNOSIS — Z86718 Personal history of other venous thrombosis and embolism: Secondary | ICD-10-CM | POA: Diagnosis present

## 2022-01-05 DIAGNOSIS — I825Z2 Chronic embolism and thrombosis of unspecified deep veins of left distal lower extremity: Secondary | ICD-10-CM

## 2022-01-05 LAB — PROTIME-INR
INR: 1.3 — ABNORMAL HIGH (ref 0.8–1.2)
Prothrombin Time: 16.1 seconds — ABNORMAL HIGH (ref 11.4–15.2)

## 2022-01-05 NOTE — Telephone Encounter (Signed)
Reviewed patient INR lab result with Dr. Myna Hidalgo. Per Dr. Myna Hidalgo patient to increase his coumadin dose to 7.5 mg daily (1.5 pills) until further notice. Pt aware that he will need labs repeated in one week and the scheduling department will reach out to schedule the lab appointment. Pt educated on his current INR level. Pt repeated change in his medication schedule and verbalized understanding and had no further questions.

## 2022-01-05 NOTE — Telephone Encounter (Signed)
On Friday, 1-DEC-23, an out-of-range INR was reported to me by [employer]. 1-DEC-23 INR = 5.7. The patient was advised to OMIT dose for that evening and consume two (2) 8-ounce bottles of Lipton's Green Tea to provide 188 micrograms of vitamin K1.  2-DEC-23 INR = 4.6 after above. The patient was advised to omit the dose on 2-DEC-23 and repeat the INR the following morning, Sunday, 3-DEC-23.  3-DEC-23 INR = 2.4 The Patient was advised to resume warfarin by taking 7.5mg  on Sunday, 3-DEC-23, and 5mg  on Monday, 4-DEC-23, and repeating the INR on Monday. The patient did not contact me with INR, nor did Wednesday.   Today, [employer] faxed these values as reflected above, as well as the value for Tuesday, 5-DEC-23, that was NOT reported to me by the patient or Sunday.  Appears from this note (below, and communicated to the patient) that Dr. [employer] or his office will be managing the warfarin for this patient.   THANK YOU!  6-DEC-23 INR = 1.5 Per Myna Hidalgo, RN for Dr. Wonda Amis:  "Reviewed patient INR lab result with Dr. Myna Hidalgo. Per Dr. Myna Hidalgo patient to increase his coumadin dose to 7.5 mg daily (1.5 pills) until further notice. Pt aware that he will need labs repeated in one week and the scheduling department will reach out to schedule the lab appointment. Pt educated on his current INR level. Pt repeated change in his medication schedule and verbalized understanding and had no further questions."

## 2022-01-12 ENCOUNTER — Inpatient Hospital Stay: Payer: No Typology Code available for payment source

## 2022-01-12 ENCOUNTER — Telehealth: Payer: Self-pay

## 2022-01-12 DIAGNOSIS — D6861 Antiphospholipid syndrome: Secondary | ICD-10-CM | POA: Diagnosis not present

## 2022-01-12 DIAGNOSIS — I825Z2 Chronic embolism and thrombosis of unspecified deep veins of left distal lower extremity: Secondary | ICD-10-CM

## 2022-01-12 LAB — PROTIME-INR
INR: 2.6 — ABNORMAL HIGH (ref 0.8–1.2)
Prothrombin Time: 28 seconds — ABNORMAL HIGH (ref 11.4–15.2)

## 2022-01-12 NOTE — Telephone Encounter (Signed)
INR result of 2.6 and Dr. Myna Hidalgo aware. Pt called and aware of results. At this time patient aware to continue his coumadin dose of 7.5 mg daily (1.5 pills). Pt aware scheduling will call him to set up a lab only appointment in 2 weeks. Pt verbalized understanding and had no further questions.

## 2022-01-25 ENCOUNTER — Other Ambulatory Visit: Payer: Self-pay | Admitting: *Deleted

## 2022-01-25 DIAGNOSIS — D6861 Antiphospholipid syndrome: Secondary | ICD-10-CM

## 2022-01-25 DIAGNOSIS — I825Z2 Chronic embolism and thrombosis of unspecified deep veins of left distal lower extremity: Secondary | ICD-10-CM

## 2022-01-26 ENCOUNTER — Inpatient Hospital Stay: Payer: No Typology Code available for payment source

## 2022-01-26 DIAGNOSIS — D6861 Antiphospholipid syndrome: Secondary | ICD-10-CM

## 2022-01-26 DIAGNOSIS — I825Z2 Chronic embolism and thrombosis of unspecified deep veins of left distal lower extremity: Secondary | ICD-10-CM

## 2022-01-26 LAB — PROTIME-INR
INR: 2.8 — ABNORMAL HIGH (ref 0.8–1.2)
Prothrombin Time: 29.2 seconds — ABNORMAL HIGH (ref 11.4–15.2)

## 2022-02-14 ENCOUNTER — Other Ambulatory Visit: Payer: Self-pay | Admitting: Hematology & Oncology

## 2022-02-14 DIAGNOSIS — D6861 Antiphospholipid syndrome: Secondary | ICD-10-CM

## 2022-02-14 DIAGNOSIS — I63119 Cerebral infarction due to embolism of unspecified vertebral artery: Secondary | ICD-10-CM

## 2022-02-16 ENCOUNTER — Other Ambulatory Visit: Payer: Self-pay

## 2022-02-16 DIAGNOSIS — I825Z2 Chronic embolism and thrombosis of unspecified deep veins of left distal lower extremity: Secondary | ICD-10-CM

## 2022-02-17 ENCOUNTER — Encounter: Payer: Self-pay | Admitting: Hematology & Oncology

## 2022-02-17 ENCOUNTER — Inpatient Hospital Stay: Payer: No Typology Code available for payment source | Attending: Hematology & Oncology

## 2022-02-17 ENCOUNTER — Inpatient Hospital Stay: Payer: No Typology Code available for payment source | Admitting: Hematology & Oncology

## 2022-02-17 VITALS — BP 134/81 | HR 72 | Temp 97.9°F | Resp 18 | Ht 67.0 in | Wt 176.8 lb

## 2022-02-17 DIAGNOSIS — Z7901 Long term (current) use of anticoagulants: Secondary | ICD-10-CM | POA: Insufficient documentation

## 2022-02-17 DIAGNOSIS — Z86718 Personal history of other venous thrombosis and embolism: Secondary | ICD-10-CM | POA: Diagnosis present

## 2022-02-17 DIAGNOSIS — I825Z2 Chronic embolism and thrombosis of unspecified deep veins of left distal lower extremity: Secondary | ICD-10-CM

## 2022-02-17 DIAGNOSIS — D6861 Antiphospholipid syndrome: Secondary | ICD-10-CM | POA: Insufficient documentation

## 2022-02-17 DIAGNOSIS — Z7982 Long term (current) use of aspirin: Secondary | ICD-10-CM | POA: Diagnosis not present

## 2022-02-17 LAB — CMP (CANCER CENTER ONLY)
ALT: 25 U/L (ref 0–44)
AST: 26 U/L (ref 15–41)
Albumin: 4.6 g/dL (ref 3.5–5.0)
Alkaline Phosphatase: 39 U/L (ref 38–126)
Anion gap: 7 (ref 5–15)
BUN: 22 mg/dL (ref 8–23)
CO2: 28 mmol/L (ref 22–32)
Calcium: 9.8 mg/dL (ref 8.9–10.3)
Chloride: 105 mmol/L (ref 98–111)
Creatinine: 1.16 mg/dL (ref 0.61–1.24)
GFR, Estimated: >60 mL/min (ref >60)
Glucose, Bld: 113 mg/dL — ABNORMAL HIGH (ref 70–99)
Potassium: 4.9 mmol/L (ref 3.5–5.1)
Sodium: 140 mmol/L (ref 135–145)
Total Bilirubin: 0.6 mg/dL (ref 0.3–1.2)
Total Protein: 7.9 g/dL (ref 6.5–8.1)

## 2022-02-17 LAB — CBC WITH DIFFERENTIAL (CANCER CENTER ONLY)
Abs Immature Granulocytes: 0.02 10*3/uL (ref 0.00–0.07)
Basophils Absolute: 0 10*3/uL (ref 0.0–0.1)
Basophils Relative: 0 %
Eosinophils Absolute: 0.1 10*3/uL (ref 0.0–0.5)
Eosinophils Relative: 2 %
HCT: 44 % (ref 39.0–52.0)
Hemoglobin: 15.1 g/dL (ref 13.0–17.0)
Immature Granulocytes: 0 %
Lymphocytes Relative: 21 %
Lymphs Abs: 1.1 10*3/uL (ref 0.7–4.0)
MCH: 30.1 pg (ref 26.0–34.0)
MCHC: 34.3 g/dL (ref 30.0–36.0)
MCV: 87.6 fL (ref 80.0–100.0)
Monocytes Absolute: 0.4 10*3/uL (ref 0.1–1.0)
Monocytes Relative: 7 %
Neutro Abs: 3.7 10*3/uL (ref 1.7–7.7)
Neutrophils Relative %: 70 %
Platelet Count: 111 10*3/uL — ABNORMAL LOW (ref 150–400)
RBC: 5.02 MIL/uL (ref 4.22–5.81)
RDW: 12.1 % (ref 11.5–15.5)
WBC Count: 5.4 10*3/uL (ref 4.0–10.5)
nRBC: 0 % (ref 0.0–0.2)

## 2022-02-17 LAB — PROTIME-INR
INR: 2.2 — ABNORMAL HIGH (ref 0.8–1.2)
Prothrombin Time: 24.4 seconds — ABNORMAL HIGH (ref 11.4–15.2)

## 2022-02-17 NOTE — Progress Notes (Signed)
Hematology and Oncology Follow Up Visit  Brian Lloyd [id number] 07/01/1960 62 y.o. 02/17/2022   Principle Diagnosis:  Antiphospholipid antibody syndrome  Current Therapy:   Lifelong Coumadin/low-dose aspirin --patient has home INR monitoring system.  Coumadin clinic is managing the INR.     Interim History:  [name] is in for follow-up.  We last saw him back in October.  Since then, he has been doing okay.  He was having a little bit of problems with his Coumadin dosing.  At some point, his Coumadin level got quite high.  He had not changed any medications.  He had not change his dose.  We had to follow this along.  His levels go back to where we wanted him to be.  He monitors his Coumadin at home.  We try to keep his INR between 2.5-3.0.  His neck is doing a lot better.  He is not having any surgery which is nice to see.  I think he was doing some physical therapy.  He has had no problems with bleeding or bruising.  His appetite has been good.  He ate well over the Holiday season.  He has had no change in bowel or bladder habits.  He has had no problems with leg swelling or pain.  He has had no issues with COVID or Influenza.  Overall, I would say that his performance status is probably ECOG 0.     Medications:  Current Outpatient Medications:    aspirin EC 81 MG tablet, Take 81 mg by mouth daily., Disp: , Rfl:    HYDROcodone-acetaminophen (NORCO) 7.5-325 MG tablet, , Disp: , Rfl:    Multiple Vitamin (MULTIVITAMINS PO), 1 tablet, Disp: , Rfl:    psyllium (METAMUCIL) 58.6 % packet, Take 1 packet by mouth daily., Disp: , Rfl:    Rimegepant Sulfate (NURTEC) 75 MG TBDP, Take 75 mg by mouth daily as needed., Disp: 8 tablet, Rfl: 6   Saw Palmetto, Serenoa repens, (SAW PALMETTO PO), Take 1 capsule by mouth 2 (two) times daily., Disp: , Rfl:    warfarin (COUMADIN) 5 MG tablet, TAKE 1 AND 1/2 TABLET ON MONDAYS AND THURSDAYS. ALL OTHER DAYS, TAKE ONLY ONE (1) TABLET., Disp:  100 tablet, Rfl: 1   acetaminophen (TYLENOL) 500 MG tablet, Take 1,000 mg by mouth every 6 (six) hours as needed for moderate pain or headache. (Patient not taking: Reported on 02/17/2022), Disp: , Rfl:    tetrahydrozoline 0.05 % ophthalmic solution, , Disp: , Rfl:   Allergies:  Allergies  Allergen Reactions   Naproxen Anaphylaxis    Past Medical History, Surgical history, Social history, and Family History were reviewed and updated.  Review of Systems: Review of Systems  Constitutional: Negative.   HENT:  Negative.    Eyes: Negative.   Respiratory: Negative.    Cardiovascular: Negative.   Gastrointestinal: Negative.   Endocrine: Negative.   Genitourinary: Negative.    Musculoskeletal:  Positive for back pain, myalgias and neck pain.  Skin: Negative.   Neurological:  Positive for dizziness.  Hematological:  Bruises/bleeds easily.  Psychiatric/Behavioral: Negative.      Physical Exam:  height is 5\' 7"  (1.702 m) and weight is 176 lb 12 oz (80.2 kg). His oral temperature is 97.9 F (36.6 C). His blood pressure is 134/81 and his pulse is 72. His respiration is 18 and oxygen saturation is 98%.   Wt Readings from Last 3 Encounters:  02/17/22 176 lb 12 oz (80.2 kg)  11/17/21 174 lb (78.9 kg)  09/15/21 178 lb 6.4 oz (80.9 kg)    Physical Exam Vitals reviewed.  HENT:     Head: Normocephalic and atraumatic.  Eyes:     Pupils: Pupils are equal, round, and reactive to light.  Cardiovascular:     Rate and Rhythm: Normal rate and regular rhythm.     Heart sounds: Normal heart sounds.  Pulmonary:     Effort: Pulmonary effort is normal.     Breath sounds: Normal breath sounds.  Abdominal:     General: Bowel sounds are normal.     Palpations: Abdomen is soft.  Musculoskeletal:        General: No tenderness or deformity. Normal range of motion.     Cervical back: Normal range of motion.  Lymphadenopathy:     Cervical: No cervical adenopathy.  Skin:    General: Skin is warm  and dry.     Findings: No erythema or rash.  Neurological:     Mental Status: He is alert and oriented to person, place, and time.  Psychiatric:        Behavior: Behavior normal.        Thought Content: Thought content normal.        Judgment: Judgment normal.      Lab Results  Component Value Date   WBC 5.4 02/17/2022   HGB 15.1 02/17/2022   HCT 44.0 02/17/2022   MCV 87.6 02/17/2022   PLT 111 (L) 02/17/2022     Chemistry      Component Value Date/Time   NA 140 02/17/2022 0944   NA 138 04/18/2016 1209   K 4.9 02/17/2022 0944   CL 105 02/17/2022 0944   CO2 28 02/17/2022 0944   BUN 22 02/17/2022 0944   BUN 18 04/18/2016 1209   CREATININE 1.16 02/17/2022 0944   CREATININE 1.27 10/26/2015 1114      Component Value Date/Time   CALCIUM 9.8 02/17/2022 0944   ALKPHOS 39 02/17/2022 0944   AST 26 02/17/2022 0944   ALT 25 02/17/2022 0944   BILITOT 0.6 02/17/2022 0944      Impression and Plan: [name] is a very nice 62 year old white male.  He has a antiphospholipid antibody syndrome.  He is on lifelong Coumadin and low-dose aspirin.  His platelet count is low but holding steady.  I think this is part of the APA syndrome.  His renal function also is better.  Again I am glad that there is no neck surgery that he needs for the cervical spine.  We will continue to follow him along in 3 months.  Again he does his Coumadin checks at home.    1/18/202410:45 AM

## 2022-03-09 ENCOUNTER — Telehealth: Payer: Self-pay | Admitting: *Deleted

## 2022-03-09 NOTE — Telephone Encounter (Signed)
Received fax from [place] with INR results from 07/27/21 to 03/08/22. Last INR 3.5 on 03/08/22. Results placed in Dr. Gladstone Pih office.

## 2022-03-29 ENCOUNTER — Telehealth: Payer: Self-pay

## 2022-03-29 NOTE — Telephone Encounter (Signed)
INR of 1.9 received from Hardin Negus, MD aware. Changing coumadin to alternating 7.'5mg'$  and '10mg'$   and recheck in 1 week. Called patient and confirmed he will start with '10mg'$  tonight, Thursday, Saturday and Monday and 7.'5mg'$  on Wednesday, Friday, Sunday and Tuesday and recheck it on Tuesday. Patient will call back Tuesday 3/5 with results.   Hardin Negus will also be faxing a order request for patient to continue using the INR check at home. Will complete order and fax back once received.

## 2022-04-04 ENCOUNTER — Telehealth: Payer: Self-pay

## 2022-04-04 NOTE — Telephone Encounter (Signed)
Pt called in stating that his INR was elevated at 4.1 today and he has had a recent Coumadin dose change to '5mg'$  tablet 1.5 on M,W,F and 1 tablet on all other days.  Per Dr Marin Olp hold Coumadin x 2 days and then start back at 1 ('5mg'$ ) tablet a day and recheck INR in 7 days. Advised pt and requested he make [place] aware of results. Pt agreed and stated understanding.

## 2022-04-12 ENCOUNTER — Telehealth: Payer: Self-pay

## 2022-04-12 NOTE — Telephone Encounter (Signed)
Received patients INR results via fax- INR 2.2 yesterday 3/11. Pt currently taking '5mg'$  daily. MD notified. Pt to continue taking '5mg'$  daily. Left message with patient to inform him and to call back if he has any questions or concerns regarding this.

## 2022-05-20 ENCOUNTER — Inpatient Hospital Stay: Payer: No Typology Code available for payment source | Admitting: Hematology & Oncology

## 2022-05-20 ENCOUNTER — Inpatient Hospital Stay: Payer: No Typology Code available for payment source | Attending: Hematology & Oncology

## 2022-05-20 ENCOUNTER — Encounter: Payer: Self-pay | Admitting: Hematology & Oncology

## 2022-05-20 VITALS — BP 122/83 | HR 66 | Temp 97.8°F | Resp 20 | Ht 67.0 in | Wt 170.1 lb

## 2022-05-20 DIAGNOSIS — Z86718 Personal history of other venous thrombosis and embolism: Secondary | ICD-10-CM | POA: Insufficient documentation

## 2022-05-20 DIAGNOSIS — D6861 Antiphospholipid syndrome: Secondary | ICD-10-CM | POA: Insufficient documentation

## 2022-05-20 DIAGNOSIS — Z7982 Long term (current) use of aspirin: Secondary | ICD-10-CM | POA: Insufficient documentation

## 2022-05-20 DIAGNOSIS — I825Z2 Chronic embolism and thrombosis of unspecified deep veins of left distal lower extremity: Secondary | ICD-10-CM | POA: Diagnosis not present

## 2022-05-20 DIAGNOSIS — Z7901 Long term (current) use of anticoagulants: Secondary | ICD-10-CM | POA: Diagnosis not present

## 2022-05-20 LAB — CBC WITH DIFFERENTIAL (CANCER CENTER ONLY)
Abs Immature Granulocytes: 0.03 10*3/uL (ref 0.00–0.07)
Basophils Absolute: 0 10*3/uL (ref 0.0–0.1)
Basophils Relative: 1 %
Eosinophils Absolute: 0.1 10*3/uL (ref 0.0–0.5)
Eosinophils Relative: 2 %
HCT: 44.6 % (ref 39.0–52.0)
Hemoglobin: 15.3 g/dL (ref 13.0–17.0)
Immature Granulocytes: 1 %
Lymphocytes Relative: 20 %
Lymphs Abs: 1.1 10*3/uL (ref 0.7–4.0)
MCH: 29.7 pg (ref 26.0–34.0)
MCHC: 34.3 g/dL (ref 30.0–36.0)
MCV: 86.6 fL (ref 80.0–100.0)
Monocytes Absolute: 0.4 10*3/uL (ref 0.1–1.0)
Monocytes Relative: 7 %
Neutro Abs: 3.8 10*3/uL (ref 1.7–7.7)
Neutrophils Relative %: 69 %
Platelet Count: 101 10*3/uL — ABNORMAL LOW (ref 150–400)
RBC: 5.15 MIL/uL (ref 4.22–5.81)
RDW: 12.2 % (ref 11.5–15.5)
WBC Count: 5.3 10*3/uL (ref 4.0–10.5)
nRBC: 0 % (ref 0.0–0.2)

## 2022-05-20 LAB — CMP (CANCER CENTER ONLY)
ALT: 22 U/L (ref 0–44)
AST: 20 U/L (ref 15–41)
Albumin: 4.3 g/dL (ref 3.5–5.0)
Alkaline Phosphatase: 48 U/L (ref 38–126)
Anion gap: 7 (ref 5–15)
BUN: 21 mg/dL (ref 8–23)
CO2: 28 mmol/L (ref 22–32)
Calcium: 9.2 mg/dL (ref 8.9–10.3)
Chloride: 105 mmol/L (ref 98–111)
Creatinine: 1.33 mg/dL — ABNORMAL HIGH (ref 0.61–1.24)
GFR, Estimated: >60 mL/min (ref >60)
Glucose, Bld: 111 mg/dL — ABNORMAL HIGH (ref 70–99)
Potassium: 4.2 mmol/L (ref 3.5–5.1)
Sodium: 140 mmol/L (ref 135–145)
Total Bilirubin: 0.6 mg/dL (ref 0.3–1.2)
Total Protein: 7.6 g/dL (ref 6.5–8.1)

## 2022-05-20 LAB — PROTIME-INR
INR: 2.1 — ABNORMAL HIGH (ref 0.8–1.2)
Prothrombin Time: 23.1 seconds — ABNORMAL HIGH (ref 11.4–15.2)

## 2022-05-20 NOTE — Progress Notes (Signed)
Hematology and Oncology Follow Up Visit  Brian Lloyd [id number] 01-20-1961 62 y.o. 05/20/2022   Principle Diagnosis:  Antiphospholipid antibody syndrome  Current Therapy:   Lifelong Coumadin/low-dose aspirin --patient has home INR monitoring system.  Coumadin clinic is managing the INR.     Interim History:  Brian Lloyd is in for follow-up.  We last saw him back in January.  Since then, he has been doing pretty well.  He is going have a good weekend.  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  He is doing well on the Coumadin.  I think he takes 10 mg a day.  He monitors the INR at home.  Today, his INR was 2.1.  I would not change his Coumadin dose.  He has had no issues with nausea or vomiting.  There is been no bleeding.  He has had no issues with legs.  He does have a lot of neck issues.  He is trying to hold off on surgery.  He has had no problems with his appetite.  He is urinating without difficulty.  He does have a little bit of urinary frequency.    Thankfully, he has had no problems with COVID.  Overall, I would say that his performance status is probably ECOG 1.     Medications:  Current Outpatient Medications:    acetaminophen (TYLENOL) 500 MG tablet, Take 1,000 mg by mouth every 6 (six) hours as needed for moderate pain or headache., Disp: , Rfl:    aspirin EC 81 MG tablet, Take 81 mg by mouth daily., Disp: , Rfl:    HYDROcodone-acetaminophen (NORCO) 7.5-325 MG tablet, Take 1 tablet by mouth every 12 (twelve) hours., Disp: , Rfl:    Multiple Vitamin (MULTIVITAMINS PO), daily., Disp: , Rfl:    psyllium (METAMUCIL) 58.6 % packet, Take 1 packet by mouth daily., Disp: , Rfl:    Saw Palmetto, Serenoa repens, (SAW PALMETTO PO), Take 1 capsule by mouth 2 (two) times daily., Disp: , Rfl:    tetrahydrozoline 0.05 % ophthalmic solution, Place 1 drop into both eyes daily. Visine-Takes only prn., Disp: , Rfl:    warfarin (COUMADIN) 5 MG tablet, TAKE 1 AND 1/2 TABLET ON  MONDAYS AND THURSDAYS. ALL OTHER DAYS, TAKE ONLY ONE (1) TABLET. (Patient taking differently: TAKE 1 AND 1/2 TABLET ON Mondays, Wednesdays and Fridays.  ALL OTHER DAYS, TAKE ONLY ONE (1) TABLET.), Disp: 100 tablet, Rfl: 1  Allergies:  Allergies  Allergen Reactions   Naproxen Anaphylaxis    Past Medical History, Surgical history, Social history, and Family History were reviewed and updated.  Review of Systems: Review of Systems  Constitutional: Negative.   HENT:  Negative.    Eyes: Negative.   Respiratory: Negative.    Cardiovascular: Negative.   Gastrointestinal: Negative.   Endocrine: Negative.   Genitourinary: Negative.    Musculoskeletal:  Positive for back pain, myalgias and neck pain.  Skin: Negative.   Neurological:  Positive for dizziness.  Hematological:  Bruises/bleeds easily.  Psychiatric/Behavioral: Negative.      Physical Exam:  height is  (1.702 m) and weight is 170 lb 1.3 oz (77.1 kg). His oral temperature is 97.8 F (36.6 C). His blood pressure is 122/83 and his pulse is 66. His respiration is 20 and oxygen saturation is 98%.   Wt Readings from Last 3 Encounters:  05/20/22 170 lb 1.3 oz (77.1 kg)  02/17/22 176 lb 12 oz (80.2 kg)  11/17/21 174 lb (78.9 kg)  Physical Exam Vitals reviewed.  HENT:     Head: Normocephalic and atraumatic.  Eyes:     Pupils: Pupils are equal, round, and reactive to light.  Cardiovascular:     Rate and Rhythm: Normal rate and regular rhythm.     Heart sounds: Normal heart sounds.  Pulmonary:     Effort: Pulmonary effort is normal.     Breath sounds: Normal breath sounds.  Abdominal:     General: Bowel sounds are normal.     Palpations: Abdomen is soft.  Musculoskeletal:        General: No tenderness or deformity. Normal range of motion.     Cervical back: Normal range of motion.  Lymphadenopathy:     Cervical: No cervical adenopathy.  Skin:    General: Skin is warm and dry.     Findings: No erythema or rash.   Neurological:     Mental Status: He is alert and oriented to person, place, and time.  Psychiatric:        Behavior: Behavior normal.        Thought Content: Thought content normal.        Judgment: Judgment normal.      Lab Results  Component Value Date   WBC 5.3 05/20/2022   HGB 15.3 05/20/2022   HCT 44.6 05/20/2022   MCV 86.6 05/20/2022   PLT 101 (L) 05/20/2022     Chemistry      Component Value Date/Time   NA 140 05/20/2022 0926   NA 138 04/18/2016 1209   K 4.2 05/20/2022 0926   CL 105 05/20/2022 0926   CO2 28 05/20/2022 0926   BUN 21 05/20/2022 0926   BUN 18 04/18/2016 1209   CREATININE 1.33 (H) 05/20/2022 0926   CREATININE 1.27 10/26/2015 1114      Component Value Date/Time   CALCIUM 9.2 05/20/2022 0926   ALKPHOS 48 05/20/2022 0926   AST 20 05/20/2022 0926   ALT 22 05/20/2022 0926   BILITOT 0.6 05/20/2022 0926      Impression and Plan: Brian Lloyd is a very nice 62 year old white male.  He has a antiphospholipid antibody syndrome.  He is on lifelong Coumadin and low-dose aspirin.  His platelet count is low but holding steady.  I think this is part of the APA syndrome.  Again I am glad that there is no neck surgery that he needs for the cervical spine.  We will continue to follow him along in 3 months.  Again he does his Coumadin checks at home.    4/19/202410:21 AM

## 2022-09-12 ENCOUNTER — Inpatient Hospital Stay: Payer: No Typology Code available for payment source | Admitting: Hematology & Oncology

## 2022-09-12 ENCOUNTER — Inpatient Hospital Stay: Payer: No Typology Code available for payment source | Attending: Hematology & Oncology

## 2022-09-22 DIAGNOSIS — R413 Other amnesia: Secondary | ICD-10-CM | POA: Insufficient documentation

## 2022-09-26 ENCOUNTER — Other Ambulatory Visit: Payer: Self-pay | Admitting: Hematology & Oncology

## 2022-09-26 DIAGNOSIS — I63119 Cerebral infarction due to embolism of unspecified vertebral artery: Secondary | ICD-10-CM

## 2022-09-26 DIAGNOSIS — D6861 Antiphospholipid syndrome: Secondary | ICD-10-CM

## 2022-12-23 ENCOUNTER — Other Ambulatory Visit: Payer: Self-pay | Admitting: Hematology & Oncology

## 2022-12-23 DIAGNOSIS — I63119 Cerebral infarction due to embolism of unspecified vertebral artery: Secondary | ICD-10-CM

## 2022-12-23 DIAGNOSIS — D6861 Antiphospholipid syndrome: Secondary | ICD-10-CM

## 2023-04-21 ENCOUNTER — Inpatient Hospital Stay: Attending: Hematology & Oncology

## 2023-04-21 ENCOUNTER — Other Ambulatory Visit: Payer: Self-pay

## 2023-04-21 ENCOUNTER — Inpatient Hospital Stay (HOSPITAL_BASED_OUTPATIENT_CLINIC_OR_DEPARTMENT_OTHER): Admitting: Hematology & Oncology

## 2023-04-21 ENCOUNTER — Encounter: Payer: Self-pay | Admitting: Hematology & Oncology

## 2023-04-21 VITALS — BP 138/93 | HR 68 | Temp 97.9°F | Resp 18 | Ht 68.0 in | Wt 173.0 lb

## 2023-04-21 DIAGNOSIS — I63119 Cerebral infarction due to embolism of unspecified vertebral artery: Secondary | ICD-10-CM

## 2023-04-21 DIAGNOSIS — Z7982 Long term (current) use of aspirin: Secondary | ICD-10-CM | POA: Diagnosis not present

## 2023-04-21 DIAGNOSIS — Z7901 Long term (current) use of anticoagulants: Secondary | ICD-10-CM | POA: Insufficient documentation

## 2023-04-21 DIAGNOSIS — D6861 Antiphospholipid syndrome: Secondary | ICD-10-CM | POA: Diagnosis present

## 2023-04-21 DIAGNOSIS — I825Z2 Chronic embolism and thrombosis of unspecified deep veins of left distal lower extremity: Secondary | ICD-10-CM

## 2023-04-21 LAB — CBC WITH DIFFERENTIAL (CANCER CENTER ONLY)
Abs Immature Granulocytes: 0.05 10*3/uL (ref 0.00–0.07)
Basophils Absolute: 0 10*3/uL (ref 0.0–0.1)
Basophils Relative: 1 %
Eosinophils Absolute: 0.1 10*3/uL (ref 0.0–0.5)
Eosinophils Relative: 2 %
HCT: 41.5 % (ref 39.0–52.0)
Hemoglobin: 14.2 g/dL (ref 13.0–17.0)
Immature Granulocytes: 1 %
Lymphocytes Relative: 19 %
Lymphs Abs: 1.2 10*3/uL (ref 0.7–4.0)
MCH: 29.8 pg (ref 26.0–34.0)
MCHC: 34.2 g/dL (ref 30.0–36.0)
MCV: 87.2 fL (ref 80.0–100.0)
Monocytes Absolute: 0.4 10*3/uL (ref 0.1–1.0)
Monocytes Relative: 6 %
Neutro Abs: 4.6 10*3/uL (ref 1.7–7.7)
Neutrophils Relative %: 71 %
Platelet Count: 115 10*3/uL — ABNORMAL LOW (ref 150–400)
RBC: 4.76 MIL/uL (ref 4.22–5.81)
RDW: 12.5 % (ref 11.5–15.5)
WBC Count: 6.4 10*3/uL (ref 4.0–10.5)
nRBC: 0 % (ref 0.0–0.2)

## 2023-04-21 LAB — CMP (CANCER CENTER ONLY)
ALT: 20 U/L (ref 0–44)
AST: 22 U/L (ref 15–41)
Albumin: 4.6 g/dL (ref 3.5–5.0)
Alkaline Phosphatase: 45 U/L (ref 38–126)
Anion gap: 7 (ref 5–15)
BUN: 21 mg/dL (ref 8–23)
CO2: 28 mmol/L (ref 22–32)
Calcium: 9.4 mg/dL (ref 8.9–10.3)
Chloride: 106 mmol/L (ref 98–111)
Creatinine: 1.25 mg/dL — ABNORMAL HIGH (ref 0.61–1.24)
GFR, Estimated: >60 mL/min (ref >60)
Glucose, Bld: 105 mg/dL — ABNORMAL HIGH (ref 70–99)
Potassium: 4.4 mmol/L (ref 3.5–5.1)
Sodium: 141 mmol/L (ref 135–145)
Total Bilirubin: 0.7 mg/dL (ref 0.0–1.2)
Total Protein: 7.6 g/dL (ref 6.5–8.1)

## 2023-04-21 LAB — PROTIME-INR
INR: 2.1 — ABNORMAL HIGH (ref 0.8–1.2)
Prothrombin Time: 23.5 s — ABNORMAL HIGH (ref 11.4–15.2)

## 2023-04-21 MED ORDER — WARFARIN SODIUM 5 MG PO TABS: ORAL_TABLET | ORAL | 4 refills | Status: DC

## 2023-04-21 NOTE — Progress Notes (Signed)
 Hematology and Oncology Follow Up Visit  Brian Lloyd [id number] [birth year] 63 y.o. 04/21/2023   Principle Diagnosis:  Antiphospholipid antibody syndrome  Current Therapy:   Lifelong Coumadin/low-dose aspirin --patient has home INR monitoring system.  Coumadin clinic is managing the INR.     Interim History:  [name] is in for follow-up.  We last saw him about a year ago.  Unfortunately, a lot is having to him since we last saw him.  Looks like he may have Lewy body dementia.  I just hate this for him.  He is not able to work.  I think his wife is going to retire.  I think he goes to the Coumadin clinic for his INR.  I am not sure that he is still doing this.  I will have to probably find out from his wife.  Today, his INR is 2.1..   He has had no bleeding.  He has had no cough or shortness of breath.  He has had no leg pain or leg swelling.  His neck does not seem to bother him as much.  He has never had surgery for the neck even though has had bad degenerative disease.  He has had a decent appetite.  He has had no nausea or vomiting.  There has been no bowel or bladder incontinence.  He has had no rashes.  Overall, I would say that his performance status is ECOG 2.     Medications:  Current Outpatient Medications:    HYDROcodone-acetaminophen (NORCO/VICODIN) 5-325 MG tablet, Take 2 tablets by mouth every 6 (six) hours as needed., Disp: , Rfl:    acetaminophen (TYLENOL) 500 MG tablet, Take 1,000 mg by mouth every 6 (six) hours as needed for moderate pain or headache., Disp: , Rfl:    aspirin EC 81 MG tablet, Take 81 mg by mouth daily., Disp: , Rfl:    Multiple Vitamin (MULTIVITAMINS PO), daily., Disp: , Rfl:    psyllium (METAMUCIL) 58.6 % packet, Take 1 packet by mouth daily., Disp: , Rfl:    Saw Palmetto, Serenoa repens, (SAW PALMETTO PO), Take 1 capsule by mouth 2 (two) times daily., Disp: , Rfl:    tetrahydrozoline 0.05 % ophthalmic solution, Place 1 drop into  both eyes daily. Visine-Takes only prn., Disp: , Rfl:    warfarin (COUMADIN) 5 MG tablet, TAKE 1 AND 1/2 TABLET ON MONDAYS AND THURSDAYS. ALL OTHER DAYS, TAKE ONLY ONE (1) TABLET., Disp: 100 tablet, Rfl: 1  Allergies:  Allergies  Allergen Reactions   Naproxen Anaphylaxis   Pseudoephedrine-Naproxen Na Er Anaphylaxis    Past Medical History, Surgical history, Social history, and Family History were reviewed and updated.  Review of Systems: Review of Systems  Constitutional: Negative.   HENT:  Negative.    Eyes: Negative.   Respiratory: Negative.    Cardiovascular: Negative.   Gastrointestinal: Negative.   Endocrine: Negative.   Genitourinary: Negative.    Musculoskeletal:  Positive for back pain, myalgias and neck pain.  Skin: Negative.   Neurological:  Positive for dizziness.  Hematological:  Bruises/bleeds easily.  Psychiatric/Behavioral: Negative.      Physical Exam:  height is 5\' 8"  (1.727 m) and weight is 173 lb (78.5 kg). His oral temperature is 97.9 F (36.6 C). His blood pressure is 138/93 (abnormal) and his pulse is 68. His respiration is 18 and oxygen saturation is 98%.   Wt Readings from Last 3 Encounters:  04/21/23 173 lb (78.5 kg)  05/20/22 170 lb 1.3 oz (77.1 kg)  02/17/22 176 lb 12 oz (80.2 kg)    Physical Exam Vitals reviewed.  HENT:     Head: Normocephalic and atraumatic.  Eyes:     Pupils: Pupils are equal, round, and reactive to light.  Cardiovascular:     Rate and Rhythm: Normal rate and regular rhythm.     Heart sounds: Normal heart sounds.  Pulmonary:     Effort: Pulmonary effort is normal.     Breath sounds: Normal breath sounds.  Abdominal:     General: Bowel sounds are normal.     Palpations: Abdomen is soft.  Musculoskeletal:        General: No tenderness or deformity. Normal range of motion.     Cervical back: Normal range of motion.  Lymphadenopathy:     Cervical: No cervical adenopathy.  Skin:    General: Skin is warm and dry.      Findings: No erythema or rash.  Neurological:     Mental Status: He is alert and oriented to person, place, and time.  Psychiatric:        Behavior: Behavior normal.        Thought Content: Thought content normal.        Judgment: Judgment normal.     Lab Results  Component Value Date   WBC 6.4 04/21/2023   HGB 14.2 04/21/2023   HCT 41.5 04/21/2023   MCV 87.2 04/21/2023   PLT 115 (L) 04/21/2023     Chemistry      Component Value Date/Time   NA 141 04/21/2023 1145   NA 138 04/18/2016 1209   K 4.4 04/21/2023 1145   CL 106 04/21/2023 1145   CO2 28 04/21/2023 1145   BUN 21 04/21/2023 1145   BUN 18 04/18/2016 1209   CREATININE 1.25 (H) 04/21/2023 1145   CREATININE 1.27 10/26/2015 1114      Component Value Date/Time   CALCIUM 9.4 04/21/2023 1145   ALKPHOS 45 04/21/2023 1145   AST 22 04/21/2023 1145   ALT 20 04/21/2023 1145   BILITOT 0.7 04/21/2023 1145      Impression and Plan: [name] is a very nice 64 year old white male.  He has a antiphospholipid antibody syndrome.  He is on lifelong Coumadin and low-dose aspirin.  So far, everything is going pretty well for him with respect to his anticoagulation.  Again, I will have to try to speak to his wife to see how his Coumadin is being followed.  I would like to get him back to see [place] in about 4 months.  I realize that it might become more difficult for him to see [place].  I just want to make his quality of life as good as possible.    3/21/202512:42 PM

## 2023-06-02 ENCOUNTER — Inpatient Hospital Stay: Attending: Hematology & Oncology

## 2023-06-02 DIAGNOSIS — I825Z2 Chronic embolism and thrombosis of unspecified deep veins of left distal lower extremity: Secondary | ICD-10-CM

## 2023-06-02 DIAGNOSIS — D6861 Antiphospholipid syndrome: Secondary | ICD-10-CM | POA: Insufficient documentation

## 2023-06-02 DIAGNOSIS — Z7901 Long term (current) use of anticoagulants: Secondary | ICD-10-CM | POA: Diagnosis not present

## 2023-06-02 LAB — CBC WITH DIFFERENTIAL (CANCER CENTER ONLY)
Abs Immature Granulocytes: 0.05 10*3/uL (ref 0.00–0.07)
Basophils Absolute: 0 10*3/uL (ref 0.0–0.1)
Basophils Relative: 1 %
Eosinophils Absolute: 0.1 10*3/uL (ref 0.0–0.5)
Eosinophils Relative: 2 %
HCT: 41.8 % (ref 39.0–52.0)
Hemoglobin: 14.4 g/dL (ref 13.0–17.0)
Immature Granulocytes: 1 %
Lymphocytes Relative: 18 %
Lymphs Abs: 1.2 10*3/uL (ref 0.7–4.0)
MCH: 29.1 pg (ref 26.0–34.0)
MCHC: 34.4 g/dL (ref 30.0–36.0)
MCV: 84.4 fL (ref 80.0–100.0)
Monocytes Absolute: 0.4 10*3/uL (ref 0.1–1.0)
Monocytes Relative: 6 %
Neutro Abs: 4.8 10*3/uL (ref 1.7–7.7)
Neutrophils Relative %: 72 %
Platelet Count: 90 10*3/uL — ABNORMAL LOW (ref 150–400)
RBC: 4.95 MIL/uL (ref 4.22–5.81)
RDW: 12.5 % (ref 11.5–15.5)
Smear Review: NORMAL
WBC Count: 6.6 10*3/uL (ref 4.0–10.5)
nRBC: 0 % (ref 0.0–0.2)

## 2023-06-02 LAB — CMP (CANCER CENTER ONLY)
ALT: 28 U/L (ref 0–44)
AST: 27 U/L (ref 15–41)
Albumin: 4.5 g/dL (ref 3.5–5.0)
Alkaline Phosphatase: 46 U/L (ref 38–126)
Anion gap: 8 (ref 5–15)
BUN: 27 mg/dL — ABNORMAL HIGH (ref 8–23)
CO2: 27 mmol/L (ref 22–32)
Calcium: 9.3 mg/dL (ref 8.9–10.3)
Chloride: 106 mmol/L (ref 98–111)
Creatinine: 1.33 mg/dL — ABNORMAL HIGH (ref 0.61–1.24)
GFR, Estimated: 60 mL/min — ABNORMAL LOW (ref >60)
Glucose, Bld: 120 mg/dL — ABNORMAL HIGH (ref 70–99)
Potassium: 4.5 mmol/L (ref 3.5–5.1)
Sodium: 141 mmol/L (ref 135–145)
Total Bilirubin: 0.5 mg/dL (ref 0.0–1.2)
Total Protein: 7.8 g/dL (ref 6.5–8.1)

## 2023-06-02 LAB — PROTIME-INR
INR: 3.9 — ABNORMAL HIGH (ref 0.8–1.2)
Prothrombin Time: 38.4 s — ABNORMAL HIGH (ref 11.4–15.2)

## 2023-07-14 ENCOUNTER — Other Ambulatory Visit: Payer: Self-pay | Admitting: *Deleted

## 2023-07-14 DIAGNOSIS — I63119 Cerebral infarction due to embolism of unspecified vertebral artery: Secondary | ICD-10-CM

## 2023-07-14 DIAGNOSIS — I825Z2 Chronic embolism and thrombosis of unspecified deep veins of left distal lower extremity: Secondary | ICD-10-CM

## 2023-07-14 DIAGNOSIS — D6861 Antiphospholipid syndrome: Secondary | ICD-10-CM

## 2023-07-17 ENCOUNTER — Inpatient Hospital Stay: Attending: Hematology & Oncology

## 2023-08-28 ENCOUNTER — Other Ambulatory Visit: Payer: Self-pay

## 2023-08-28 ENCOUNTER — Ambulatory Visit: Payer: Self-pay | Admitting: Hematology & Oncology

## 2023-08-28 ENCOUNTER — Inpatient Hospital Stay: Attending: Hematology & Oncology

## 2023-08-28 DIAGNOSIS — Z7982 Long term (current) use of aspirin: Secondary | ICD-10-CM | POA: Diagnosis not present

## 2023-08-28 DIAGNOSIS — D6861 Antiphospholipid syndrome: Secondary | ICD-10-CM | POA: Insufficient documentation

## 2023-08-28 DIAGNOSIS — I825Z2 Chronic embolism and thrombosis of unspecified deep veins of left distal lower extremity: Secondary | ICD-10-CM

## 2023-08-28 DIAGNOSIS — Z7901 Long term (current) use of anticoagulants: Secondary | ICD-10-CM | POA: Insufficient documentation

## 2023-08-28 DIAGNOSIS — I63119 Cerebral infarction due to embolism of unspecified vertebral artery: Secondary | ICD-10-CM

## 2023-08-28 LAB — CBC WITH DIFFERENTIAL (CANCER CENTER ONLY)
Abs Immature Granulocytes: 0.02 K/uL (ref 0.00–0.07)
Basophils Absolute: 0 K/uL (ref 0.0–0.1)
Basophils Relative: 1 %
Eosinophils Absolute: 0.1 K/uL (ref 0.0–0.5)
Eosinophils Relative: 1 %
HCT: 41 % (ref 39.0–52.0)
Hemoglobin: 14.1 g/dL (ref 13.0–17.0)
Immature Granulocytes: 0 %
Lymphocytes Relative: 16 %
Lymphs Abs: 0.9 K/uL (ref 0.7–4.0)
MCH: 29.4 pg (ref 26.0–34.0)
MCHC: 34.4 g/dL (ref 30.0–36.0)
MCV: 85.4 fL (ref 80.0–100.0)
Monocytes Absolute: 0.3 K/uL (ref 0.1–1.0)
Monocytes Relative: 5 %
Neutro Abs: 4.3 K/uL (ref 1.7–7.7)
Neutrophils Relative %: 77 %
Platelet Count: 54 K/uL — ABNORMAL LOW (ref 150–400)
RBC: 4.8 MIL/uL (ref 4.22–5.81)
RDW: 12.6 % (ref 11.5–15.5)
WBC Count: 5.6 K/uL (ref 4.0–10.5)
nRBC: 0 % (ref 0.0–0.2)

## 2023-08-28 LAB — CMP (CANCER CENTER ONLY)
ALT: 33 U/L (ref 0–44)
AST: 31 U/L (ref 15–41)
Albumin: 4.5 g/dL (ref 3.5–5.0)
Alkaline Phosphatase: 49 U/L (ref 38–126)
Anion gap: 10 (ref 5–15)
BUN: 17 mg/dL (ref 8–23)
CO2: 24 mmol/L (ref 22–32)
Calcium: 9.2 mg/dL (ref 8.9–10.3)
Chloride: 108 mmol/L (ref 98–111)
Creatinine: 1.33 mg/dL — ABNORMAL HIGH (ref 0.61–1.24)
GFR, Estimated: 60 mL/min — ABNORMAL LOW (ref >60)
Glucose, Bld: 114 mg/dL — ABNORMAL HIGH (ref 70–99)
Potassium: 4.6 mmol/L (ref 3.5–5.1)
Sodium: 142 mmol/L (ref 135–145)
Total Bilirubin: 0.6 mg/dL (ref 0.0–1.2)
Total Protein: 7.4 g/dL (ref 6.5–8.1)

## 2023-08-28 LAB — PROTIME-INR
INR: 2.2 — ABNORMAL HIGH (ref 0.8–1.2)
Prothrombin Time: 25.4 s — ABNORMAL HIGH (ref 11.4–15.2)

## 2023-08-30 ENCOUNTER — Telehealth: Payer: Self-pay | Admitting: Hematology & Oncology

## 2023-08-30 NOTE — Telephone Encounter (Signed)
 Called to schedule lab only appt per inbasket. LVM to return call for scheduling.

## 2023-09-01 ENCOUNTER — Telehealth: Payer: Self-pay | Admitting: Hematology & Oncology

## 2023-09-01 NOTE — Telephone Encounter (Signed)
 Called to schedule lab only appt per inbasket. LVM to return call for scheduling.

## 2023-09-06 ENCOUNTER — Other Ambulatory Visit: Payer: Self-pay | Admitting: Hematology & Oncology

## 2023-09-06 DIAGNOSIS — D6861 Antiphospholipid syndrome: Secondary | ICD-10-CM

## 2023-09-06 DIAGNOSIS — I63119 Cerebral infarction due to embolism of unspecified vertebral artery: Secondary | ICD-10-CM

## 2023-09-11 ENCOUNTER — Inpatient Hospital Stay: Attending: Hematology & Oncology

## 2023-09-11 DIAGNOSIS — D6861 Antiphospholipid syndrome: Secondary | ICD-10-CM | POA: Insufficient documentation

## 2023-09-11 LAB — CBC WITH DIFFERENTIAL (CANCER CENTER ONLY)
Abs Granulocyte: 4 K/uL (ref 1.5–6.5)
Abs Immature Granulocytes: 0.03 K/uL (ref 0.00–0.07)
Basophils Absolute: 0 K/uL (ref 0.0–0.1)
Basophils Relative: 1 %
Eosinophils Absolute: 0.1 K/uL (ref 0.0–0.5)
Eosinophils Relative: 1 %
HCT: 41.5 % (ref 39.0–52.0)
Hemoglobin: 14.4 g/dL (ref 13.0–17.0)
Immature Granulocytes: 1 %
Lymphocytes Relative: 19 %
Lymphs Abs: 1 K/uL (ref 0.7–4.0)
MCH: 29.7 pg (ref 26.0–34.0)
MCHC: 34.7 g/dL (ref 30.0–36.0)
MCV: 85.6 fL (ref 80.0–100.0)
Monocytes Absolute: 0.4 K/uL (ref 0.1–1.0)
Monocytes Relative: 7 %
Neutro Abs: 4 K/uL (ref 1.7–7.7)
Neutrophils Relative %: 71 %
Platelet Count: 80 K/uL — ABNORMAL LOW (ref 150–400)
RBC: 4.85 MIL/uL (ref 4.22–5.81)
RDW: 12.5 % (ref 11.5–15.5)
WBC Count: 5.5 K/uL (ref 4.0–10.5)
nRBC: 0 % (ref 0.0–0.2)

## 2023-10-06 ENCOUNTER — Other Ambulatory Visit: Payer: Self-pay

## 2023-10-06 DIAGNOSIS — D6861 Antiphospholipid syndrome: Secondary | ICD-10-CM

## 2023-10-06 DIAGNOSIS — I63119 Cerebral infarction due to embolism of unspecified vertebral artery: Secondary | ICD-10-CM

## 2023-10-06 DIAGNOSIS — I825Z2 Chronic embolism and thrombosis of unspecified deep veins of left distal lower extremity: Secondary | ICD-10-CM

## 2023-10-09 ENCOUNTER — Inpatient Hospital Stay

## 2023-10-09 ENCOUNTER — Inpatient Hospital Stay: Attending: Hematology & Oncology

## 2023-10-09 ENCOUNTER — Inpatient Hospital Stay: Admitting: Hematology & Oncology

## 2023-10-09 DIAGNOSIS — Z7982 Long term (current) use of aspirin: Secondary | ICD-10-CM | POA: Insufficient documentation

## 2023-10-09 DIAGNOSIS — Z5181 Encounter for therapeutic drug level monitoring: Secondary | ICD-10-CM | POA: Insufficient documentation

## 2023-10-09 DIAGNOSIS — Z7901 Long term (current) use of anticoagulants: Secondary | ICD-10-CM | POA: Insufficient documentation

## 2023-10-09 DIAGNOSIS — Z79899 Other long term (current) drug therapy: Secondary | ICD-10-CM | POA: Insufficient documentation

## 2023-10-09 DIAGNOSIS — D6861 Antiphospholipid syndrome: Secondary | ICD-10-CM | POA: Insufficient documentation

## 2023-10-20 ENCOUNTER — Inpatient Hospital Stay

## 2023-10-20 ENCOUNTER — Inpatient Hospital Stay: Admitting: Hematology & Oncology

## 2023-10-20 ENCOUNTER — Encounter: Payer: Self-pay | Admitting: Hematology & Oncology

## 2023-10-20 ENCOUNTER — Other Ambulatory Visit: Payer: Self-pay

## 2023-10-20 DIAGNOSIS — I63119 Cerebral infarction due to embolism of unspecified vertebral artery: Secondary | ICD-10-CM

## 2023-10-20 DIAGNOSIS — Z79899 Other long term (current) drug therapy: Secondary | ICD-10-CM | POA: Diagnosis not present

## 2023-10-20 DIAGNOSIS — D6861 Antiphospholipid syndrome: Secondary | ICD-10-CM | POA: Diagnosis not present

## 2023-10-20 DIAGNOSIS — I825Z2 Chronic embolism and thrombosis of unspecified deep veins of left distal lower extremity: Secondary | ICD-10-CM

## 2023-10-20 DIAGNOSIS — Z5181 Encounter for therapeutic drug level monitoring: Secondary | ICD-10-CM | POA: Diagnosis not present

## 2023-10-20 DIAGNOSIS — Z7982 Long term (current) use of aspirin: Secondary | ICD-10-CM | POA: Diagnosis not present

## 2023-10-20 DIAGNOSIS — Z7901 Long term (current) use of anticoagulants: Secondary | ICD-10-CM | POA: Diagnosis not present

## 2023-10-20 LAB — CBC WITH DIFFERENTIAL (CANCER CENTER ONLY)
Abs Immature Granulocytes: 0.04 K/uL (ref 0.00–0.07)
Basophils Absolute: 0 K/uL (ref 0.0–0.1)
Basophils Relative: 1 %
Eosinophils Absolute: 0.1 K/uL (ref 0.0–0.5)
Eosinophils Relative: 1 %
HCT: 42.2 % (ref 39.0–52.0)
Hemoglobin: 14.5 g/dL (ref 13.0–17.0)
Immature Granulocytes: 1 %
Lymphocytes Relative: 18 %
Lymphs Abs: 1.2 K/uL (ref 0.7–4.0)
MCH: 29.7 pg (ref 26.0–34.0)
MCHC: 34.4 g/dL (ref 30.0–36.0)
MCV: 86.3 fL (ref 80.0–100.0)
Monocytes Absolute: 0.4 K/uL (ref 0.1–1.0)
Monocytes Relative: 6 %
Neutro Abs: 4.8 K/uL (ref 1.7–7.7)
Neutrophils Relative %: 73 %
Platelet Count: 114 K/uL — ABNORMAL LOW (ref 150–400)
RBC: 4.89 MIL/uL (ref 4.22–5.81)
RDW: 12.9 % (ref 11.5–15.5)
WBC Count: 6.5 K/uL (ref 4.0–10.5)
nRBC: 0 % (ref 0.0–0.2)

## 2023-10-20 LAB — CMP (CANCER CENTER ONLY)
ALT: 49 U/L — ABNORMAL HIGH (ref 0–44)
AST: 51 U/L — ABNORMAL HIGH (ref 15–41)
Albumin: 4.8 g/dL (ref 3.5–5.0)
Alkaline Phosphatase: 55 U/L (ref 38–126)
Anion gap: 10 (ref 5–15)
BUN: 24 mg/dL — ABNORMAL HIGH (ref 8–23)
CO2: 26 mmol/L (ref 22–32)
Calcium: 9.5 mg/dL (ref 8.9–10.3)
Chloride: 107 mmol/L (ref 98–111)
Creatinine: 1.24 mg/dL (ref 0.61–1.24)
GFR, Estimated: >60 mL/min (ref >60)
Glucose, Bld: 118 mg/dL — ABNORMAL HIGH (ref 70–99)
Potassium: 4.5 mmol/L (ref 3.5–5.1)
Sodium: 143 mmol/L (ref 135–145)
Total Bilirubin: 0.6 mg/dL (ref 0.0–1.2)
Total Protein: 7.9 g/dL (ref 6.5–8.1)

## 2023-10-20 LAB — PROTIME-INR
INR: 2.9 — ABNORMAL HIGH (ref 0.8–1.2)
Prothrombin Time: 31.8 s — ABNORMAL HIGH (ref 11.4–15.2)

## 2023-10-20 MED ORDER — WARFARIN SODIUM 5 MG PO TABS: ORAL_TABLET | ORAL | 1 refills | Status: AC

## 2023-10-20 NOTE — Progress Notes (Signed)
 Hematology and Oncology Follow Up Visit  Brian Lloyd [id number] 11/02/1960 63 y.o. 10/20/2023   Principle Diagnosis:  Antiphospholipid antibody syndrome  Current Therapy:   Lifelong Coumadin /low-dose aspirin  --patient has home INR monitoring system.  Coumadin  clinic is managing the INR.     Interim History:  Brian Lloyd is in for follow-up.  We last saw him in March of this past year.  His wife is over in [place] right now.  It sounds like she is going to retire soon.  She is celebrating her retirement with some friends.  He does have the Lewy body dementia.  It is hard to say if this is progressing.  I suspect that this probably is progressing.  He is on Coumadin .  His INR today is 2.9 which is perfect.  He still has a problem with his neck.  He has arthritis in the neck.  He probably needs to have surgery but I am not sure if this is going to be done given his Lewy body dementia.  His appetite seems to be doing okay.  He has had no nausea or vomiting.  He has had no change in bowel or bladder habits.  He has had no fever.  Thankfully, he has avoided COVID.  Overall, I would have just a that his performance status is ECOG 2.      Medications:  Current Outpatient Medications:    donepezil (ARICEPT) 10 MG tablet, Take 10 mg by mouth daily., Disp: , Rfl:    escitalopram (LEXAPRO) 5 MG tablet, Take 5 mg by mouth daily., Disp: , Rfl:    acetaminophen  (TYLENOL ) 500 MG tablet, Take 1,000 mg by mouth every 6 (six) hours as needed for moderate pain or headache., Disp: , Rfl:    aspirin  EC 81 MG tablet, Take 81 mg by mouth daily., Disp: , Rfl:    Cholecalciferol (D 1000) 25 MCG (1000 UT) capsule, Take 1 capsule by mouth daily., Disp: , Rfl:    HYDROcodone -acetaminophen  (NORCO/VICODIN) 5-325 MG tablet, Take 2 tablets by mouth every 6 (six) hours as needed., Disp: , Rfl:    Multiple Vitamin (MULTIVITAMINS PO), daily., Disp: , Rfl:    psyllium (METAMUCIL) 58.6 % packet, Take 1  packet by mouth daily., Disp: , Rfl:    Saw Palmetto, Serenoa repens, (SAW PALMETTO PO), Take 1 capsule by mouth 2 (two) times daily., Disp: , Rfl:    tetrahydrozoline 0.05 % ophthalmic solution, Place 1 drop into both eyes daily. Visine-Takes only prn., Disp: , Rfl:    warfarin (COUMADIN ) 5 MG tablet, TAKE 1 AND 1/2 TABLET ON MONDAYS, WEDNESDAYS, and FRIDAYS. ALL OTHER DAYS, TAKE ONLY ONE (1) TABLET., Disp: 100 tablet, Rfl: 1  Allergies:  Allergies  Allergen Reactions   Naproxen Anaphylaxis   Pseudoephedrine-Naproxen Na Er Anaphylaxis    Past Medical History, Surgical history, Social history, and Family History were reviewed and updated.  Review of Systems: Review of Systems  Constitutional: Negative.   HENT:  Negative.    Eyes: Negative.   Respiratory: Negative.    Cardiovascular: Negative.   Gastrointestinal: Negative.   Endocrine: Negative.   Genitourinary: Negative.    Musculoskeletal:  Positive for back pain, myalgias and neck pain.  Skin: Negative.   Neurological:  Positive for dizziness.  Hematological:  Bruises/bleeds easily.  Psychiatric/Behavioral: Negative.      Physical Exam:  Vital signs show temperature of 98.4.  Pulse 65.  Blood pressure 124/85.  Weight is 163 pounds.  Wt Readings from Last 3 Encounters:  04/21/23 173 lb (78.5 kg)  05/20/22 170 lb 1.3 oz (77.1 kg)  02/17/22 176 lb 12 oz (80.2 kg)    Physical Exam Vitals reviewed.  HENT:     Head: Normocephalic and atraumatic.  Eyes:     Pupils: Pupils are equal, round, and reactive to light.  Cardiovascular:     Rate and Rhythm: Normal rate and regular rhythm.     Heart sounds: Normal heart sounds.  Pulmonary:     Effort: Pulmonary effort is normal.     Breath sounds: Normal breath sounds.  Abdominal:     General: Bowel sounds are normal.     Palpations: Abdomen is soft.  Musculoskeletal:        General: No tenderness or deformity. Normal range of motion.     Cervical back: Normal range of  motion.  Lymphadenopathy:     Cervical: No cervical adenopathy.  Skin:    General: Skin is warm and dry.     Findings: No erythema or rash.  Neurological:     Mental Status: He is alert and oriented to person, place, and time.  Psychiatric:        Behavior: Behavior normal.        Thought Content: Thought content normal.        Judgment: Judgment normal.      Lab Results  Component Value Date   WBC 6.5 10/20/2023   HGB 14.5 10/20/2023   HCT 42.2 10/20/2023   MCV 86.3 10/20/2023   PLT 114 (L) 10/20/2023     Chemistry      Component Value Date/Time   NA 143 10/20/2023 1419   NA 138 04/18/2016 1209   K 4.5 10/20/2023 1419   CL 107 10/20/2023 1419   CO2 26 10/20/2023 1419   BUN 24 (H) 10/20/2023 1419   BUN 18 04/18/2016 1209   CREATININE 1.24 10/20/2023 1419   CREATININE 1.27 10/26/2015 1114      Component Value Date/Time   CALCIUM  9.5 10/20/2023 1419   ALKPHOS 55 10/20/2023 1419   AST 51 (H) 10/20/2023 1419   ALT 49 (H) 10/20/2023 1419   BILITOT 0.6 10/20/2023 1419      Impression and Plan: Brian Lloyd is a very nice 63 year old white male.  He has a antiphospholipid antibody syndrome.  He is on lifelong Coumadin  and low-dose aspirin .  His INR is perfect right now.  There will be no change in his Coumadin  dose.  I hate that he has his Lewy body dementia.  Hopefully, this will not progress too quickly.  I think we can probably get him through the holiday season now.  I would like to get him back to see us  in about 4 months.    9/19/20252:52 PM

## 2023-11-20 ENCOUNTER — Ambulatory Visit: Payer: Self-pay | Admitting: Hematology & Oncology

## 2023-11-20 ENCOUNTER — Inpatient Hospital Stay: Attending: Hematology & Oncology

## 2023-11-20 DIAGNOSIS — D6861 Antiphospholipid syndrome: Secondary | ICD-10-CM | POA: Diagnosis present

## 2023-11-20 LAB — PROTIME-INR
INR: 2.6 — ABNORMAL HIGH (ref 0.8–1.2)
Prothrombin Time: 29.4 s — ABNORMAL HIGH (ref 11.4–15.2)

## 2023-11-29 DIAGNOSIS — G894 Chronic pain syndrome: Secondary | ICD-10-CM | POA: Diagnosis not present

## 2023-11-29 DIAGNOSIS — Z6826 Body mass index (BMI) 26.0-26.9, adult: Secondary | ICD-10-CM | POA: Diagnosis not present

## 2023-12-18 ENCOUNTER — Ambulatory Visit: Payer: Self-pay | Admitting: Hematology & Oncology

## 2023-12-18 ENCOUNTER — Inpatient Hospital Stay: Attending: Hematology & Oncology

## 2023-12-18 DIAGNOSIS — Z7901 Long term (current) use of anticoagulants: Secondary | ICD-10-CM | POA: Diagnosis not present

## 2023-12-18 DIAGNOSIS — D6861 Antiphospholipid syndrome: Secondary | ICD-10-CM | POA: Diagnosis present

## 2023-12-18 LAB — PROTIME-INR
INR: 2.8 — ABNORMAL HIGH (ref 0.8–1.2)
Prothrombin Time: 31 s — ABNORMAL HIGH (ref 11.4–15.2)

## 2024-01-01 ENCOUNTER — Inpatient Hospital Stay

## 2024-01-04 ENCOUNTER — Ambulatory Visit: Attending: Geriatric Medicine | Admitting: Physical Therapy

## 2024-01-04 ENCOUNTER — Encounter: Payer: Self-pay | Admitting: Physical Therapy

## 2024-01-04 DIAGNOSIS — R29818 Other symptoms and signs involving the nervous system: Secondary | ICD-10-CM | POA: Diagnosis present

## 2024-01-04 DIAGNOSIS — R41840 Attention and concentration deficit: Secondary | ICD-10-CM | POA: Insufficient documentation

## 2024-01-04 DIAGNOSIS — M6281 Muscle weakness (generalized): Secondary | ICD-10-CM | POA: Diagnosis present

## 2024-01-04 DIAGNOSIS — R41844 Frontal lobe and executive function deficit: Secondary | ICD-10-CM | POA: Insufficient documentation

## 2024-01-04 DIAGNOSIS — R278 Other lack of coordination: Secondary | ICD-10-CM | POA: Insufficient documentation

## 2024-01-04 DIAGNOSIS — Z9181 History of falling: Secondary | ICD-10-CM | POA: Diagnosis present

## 2024-01-04 DIAGNOSIS — R2689 Other abnormalities of gait and mobility: Secondary | ICD-10-CM | POA: Insufficient documentation

## 2024-01-04 DIAGNOSIS — R293 Abnormal posture: Secondary | ICD-10-CM | POA: Diagnosis present

## 2024-01-04 DIAGNOSIS — R2681 Unsteadiness on feet: Secondary | ICD-10-CM | POA: Diagnosis present

## 2024-01-04 DIAGNOSIS — R41842 Visuospatial deficit: Secondary | ICD-10-CM | POA: Diagnosis present
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
## 2024-01-09 ENCOUNTER — Encounter: Payer: Self-pay | Admitting: Physical Therapy

## 2024-01-09 ENCOUNTER — Ambulatory Visit: Admitting: Physical Therapy

## 2024-01-09 VITALS — BP 114/72 | HR 75

## 2024-01-09 DIAGNOSIS — M6281 Muscle weakness (generalized): Secondary | ICD-10-CM

## 2024-01-09 DIAGNOSIS — R293 Abnormal posture: Secondary | ICD-10-CM | POA: Diagnosis not present

## 2024-01-09 DIAGNOSIS — R2681 Unsteadiness on feet: Secondary | ICD-10-CM

## 2024-01-09 DIAGNOSIS — R2689 Other abnormalities of gait and mobility: Secondary | ICD-10-CM

## 2024-01-09 DIAGNOSIS — Z9181 History of falling: Secondary | ICD-10-CM
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
## 2024-01-15 ENCOUNTER — Ambulatory Visit: Admitting: Physical Therapy

## 2024-01-15 ENCOUNTER — Inpatient Hospital Stay: Attending: Hematology & Oncology

## 2024-01-15 VITALS — BP 125/90 | HR 69

## 2024-01-15 DIAGNOSIS — I63119 Cerebral infarction due to embolism of unspecified vertebral artery: Secondary | ICD-10-CM

## 2024-01-15 DIAGNOSIS — D6861 Antiphospholipid syndrome: Secondary | ICD-10-CM | POA: Diagnosis present

## 2024-01-15 DIAGNOSIS — R293 Abnormal posture: Secondary | ICD-10-CM | POA: Diagnosis not present

## 2024-01-15 DIAGNOSIS — R2689 Other abnormalities of gait and mobility: Secondary | ICD-10-CM

## 2024-01-15 DIAGNOSIS — M6281 Muscle weakness (generalized): Secondary | ICD-10-CM

## 2024-01-15 DIAGNOSIS — Z9181 History of falling: Secondary | ICD-10-CM

## 2024-01-15 LAB — CBC WITH DIFFERENTIAL (CANCER CENTER ONLY)
Abs Immature Granulocytes: 0.03 K/uL (ref 0.00–0.07)
Basophils Absolute: 0 K/uL (ref 0.0–0.1)
Basophils Relative: 0 %
Eosinophils Absolute: 0.1 K/uL (ref 0.0–0.5)
Eosinophils Relative: 1 %
HCT: 41.8 % (ref 39.0–52.0)
Hemoglobin: 14.3 g/dL (ref 13.0–17.0)
Immature Granulocytes: 1 %
Lymphocytes Relative: 20 %
Lymphs Abs: 1.1 K/uL (ref 0.7–4.0)
MCH: 29.7 pg (ref 26.0–34.0)
MCHC: 34.2 g/dL (ref 30.0–36.0)
MCV: 86.9 fL (ref 80.0–100.0)
Monocytes Absolute: 0.4 K/uL (ref 0.1–1.0)
Monocytes Relative: 7 %
Neutro Abs: 4 K/uL (ref 1.7–7.7)
Neutrophils Relative %: 71 %
Platelet Count: 69 K/uL — ABNORMAL LOW (ref 150–400)
RBC: 4.81 MIL/uL (ref 4.22–5.81)
RDW: 12.3 % (ref 11.5–15.5)
WBC Count: 5.6 K/uL (ref 4.0–10.5)
nRBC: 0 % (ref 0.0–0.2)

## 2024-01-15 LAB — CMP (CANCER CENTER ONLY)
ALT: 36 U/L (ref 0–44)
AST: 43 U/L — ABNORMAL HIGH (ref 15–41)
Albumin: 4.3 g/dL (ref 3.5–5.0)
Alkaline Phosphatase: 49 U/L (ref 38–126)
Anion gap: 10 (ref 5–15)
BUN: 25 mg/dL — ABNORMAL HIGH (ref 8–23)
CO2: 25 mmol/L (ref 22–32)
Calcium: 8.9 mg/dL (ref 8.9–10.3)
Chloride: 104 mmol/L (ref 98–111)
Creatinine: 1.19 mg/dL (ref 0.61–1.24)
GFR, Estimated: >60 mL/min (ref >60)
Glucose, Bld: 105 mg/dL — ABNORMAL HIGH (ref 70–99)
Potassium: 4.3 mmol/L (ref 3.5–5.1)
Sodium: 139 mmol/L (ref 135–145)
Total Bilirubin: 0.5 mg/dL (ref 0.0–1.2)
Total Protein: 7.6 g/dL (ref 6.5–8.1)

## 2024-01-15 LAB — PROTIME-INR
INR: 3.8 — ABNORMAL HIGH (ref 0.8–1.2)
Prothrombin Time: 38.9 s — ABNORMAL HIGH (ref 11.4–15.2)
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
## 2024-01-18 ENCOUNTER — Ambulatory Visit: Admitting: Physical Therapy

## 2024-01-18 VITALS — BP 139/94 | HR 67

## 2024-01-18 DIAGNOSIS — R2681 Unsteadiness on feet: Secondary | ICD-10-CM

## 2024-01-18 DIAGNOSIS — R2689 Other abnormalities of gait and mobility: Secondary | ICD-10-CM

## 2024-01-18 DIAGNOSIS — R293 Abnormal posture: Secondary | ICD-10-CM | POA: Diagnosis not present

## 2024-01-18 DIAGNOSIS — M6281 Muscle weakness (generalized): Secondary | ICD-10-CM
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
## 2024-01-22 ENCOUNTER — Encounter: Payer: Self-pay | Admitting: Physical Therapy

## 2024-01-22 ENCOUNTER — Ambulatory Visit: Admitting: Physical Therapy

## 2024-01-22 VITALS — BP 140/81 | HR 63

## 2024-01-22 DIAGNOSIS — R293 Abnormal posture: Secondary | ICD-10-CM | POA: Diagnosis not present

## 2024-01-22 DIAGNOSIS — M6281 Muscle weakness (generalized): Secondary | ICD-10-CM

## 2024-01-22 DIAGNOSIS — R2681 Unsteadiness on feet: Secondary | ICD-10-CM

## 2024-01-22 DIAGNOSIS — R2689 Other abnormalities of gait and mobility: Secondary | ICD-10-CM
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
## 2024-01-29 ENCOUNTER — Ambulatory Visit: Admitting: Physical Therapy

## 2024-01-29 ENCOUNTER — Encounter: Payer: Self-pay | Admitting: Physical Therapy

## 2024-01-29 ENCOUNTER — Ambulatory Visit: Admitting: Occupational Therapy

## 2024-01-29 VITALS — BP 136/80 | HR 64

## 2024-01-29 DIAGNOSIS — R29818 Other symptoms and signs involving the nervous system: Secondary | ICD-10-CM

## 2024-01-29 DIAGNOSIS — R2681 Unsteadiness on feet: Secondary | ICD-10-CM

## 2024-01-29 DIAGNOSIS — R293 Abnormal posture: Secondary | ICD-10-CM

## 2024-01-29 DIAGNOSIS — R41844 Frontal lobe and executive function deficit: Secondary | ICD-10-CM

## 2024-01-29 DIAGNOSIS — R41840 Attention and concentration deficit: Secondary | ICD-10-CM

## 2024-01-29 DIAGNOSIS — M6281 Muscle weakness (generalized): Secondary | ICD-10-CM

## 2024-01-29 DIAGNOSIS — R278 Other lack of coordination: Secondary | ICD-10-CM

## 2024-01-29 DIAGNOSIS — R41842 Visuospatial deficit: Secondary | ICD-10-CM

## 2024-01-29 DIAGNOSIS — R2689 Other abnormalities of gait and mobility: Secondary | ICD-10-CM
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
## 2024-01-31 ENCOUNTER — Ambulatory Visit: Admitting: Occupational Therapy

## 2024-01-31 ENCOUNTER — Ambulatory Visit: Admitting: Physical Therapy

## 2024-01-31 VITALS — BP 132/85 | HR 67

## 2024-01-31 DIAGNOSIS — R2681 Unsteadiness on feet: Secondary | ICD-10-CM

## 2024-01-31 DIAGNOSIS — M6281 Muscle weakness (generalized): Secondary | ICD-10-CM

## 2024-01-31 DIAGNOSIS — R278 Other lack of coordination: Secondary | ICD-10-CM

## 2024-01-31 DIAGNOSIS — R41840 Attention and concentration deficit: Secondary | ICD-10-CM

## 2024-01-31 DIAGNOSIS — R41842 Visuospatial deficit: Secondary | ICD-10-CM

## 2024-01-31 DIAGNOSIS — R41844 Frontal lobe and executive function deficit: Secondary | ICD-10-CM

## 2024-01-31 DIAGNOSIS — R2689 Other abnormalities of gait and mobility: Secondary | ICD-10-CM

## 2024-01-31 DIAGNOSIS — R29818 Other symptoms and signs involving the nervous system: Secondary | ICD-10-CM

## 2024-01-31 DIAGNOSIS — Z9181 History of falling: Secondary | ICD-10-CM

## 2024-01-31 DIAGNOSIS — R293 Abnormal posture: Secondary | ICD-10-CM | POA: Diagnosis not present

## 2024-01-31 NOTE — Patient Instructions (Addendum)
 PWR! Hand Exercises Perform each exercise at least 10 repetitions 1x/day, and PWR! PUSH throughout the day when you are having trouble using your hands (picking up small objects, writing, eating, typing, buttoning, etc.). ** Make each movement big and deliberate; feel the movement.  PWR! UP: Fists to open fingers BIG  PWR! Rock:  Move wrists up and down [employer]! Twist: Twist palms up and down BIG  PWR! Step: Step your thumb to index finger while keeping other fingers straight. Flick fingers out BIG (thumb out/straighten fingers). Repeat with other fingers.  PWR! PUSH: Push hands out BIG. Elbows straight, wrists up, fingers open and spread apart BIG.      Coordination Exercises  Perform the following exercises for 20 minutes 1 times per day. Perform with both hand(s). Perform using big movements.  Flipping Cards: Place deck of cards on the table. Flip cards over by opening your hand big to grasp and then turn your palm up big, opening hand fully to release.  Deal cards: Hold 1/2 or whole deck in your hand. Use thumb to push card off top of deck with one big push.  Flip card between each finger.  Place card on tabletop.  Then flick fingers (extend fingers) powerfully to slide card off table (can have chair/box below table to catch the cards).  Rotate ball with fingertips: Pick up with fingers/thumb and move as much as you can with each turn/movement (clockwise and counter-clockwise).  Toss ball from one hand to the other: Toss big/high.  Deliberately open with toss and deliberately close hand after catch.  Toss ball in the air and catch with the same hand: Toss big/high.  Deliberately open with toss and deliberately close hand after catch.  Juggle 2 balls: Do not go fast. Pause after each toss.  Rotate 2 golf balls in your hand: Both directions.  Pick up coins and place in coin bank or container: Open hand big and pick up with big, intentional movements. Do not drag coin to the  edge.  Pick up coins and stack one at a time: Open hand big and pick up with big, intentional movements. Do not drag coin to the edge. (5-10 in a stack)  Pick up 5-10 coins one at a time and hold in palm. Then, move coins from palm to fingertips one at time and place in coin bank/container.  Pick up 5-10 coins one at a time and hold in palm. Then, move coins from palm to fingertips one at a time to stack.  Practice writing: Slow down, write big, and focus on forming each letter.
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
## 2024-02-05 ENCOUNTER — Encounter: Payer: Self-pay | Admitting: Speech Pathology

## 2024-02-05 ENCOUNTER — Ambulatory Visit: Attending: Geriatric Medicine | Admitting: Speech Pathology

## 2024-02-05 ENCOUNTER — Encounter: Payer: Self-pay | Admitting: Physical Therapy

## 2024-02-05 ENCOUNTER — Ambulatory Visit: Admitting: Physical Therapy

## 2024-02-05 ENCOUNTER — Ambulatory Visit: Admitting: Occupational Therapy

## 2024-02-05 VITALS — BP 132/81 | HR 63

## 2024-02-05 DIAGNOSIS — R293 Abnormal posture: Secondary | ICD-10-CM | POA: Insufficient documentation

## 2024-02-05 DIAGNOSIS — R471 Dysarthria and anarthria: Secondary | ICD-10-CM | POA: Insufficient documentation

## 2024-02-05 DIAGNOSIS — R41841 Cognitive communication deficit: Secondary | ICD-10-CM | POA: Insufficient documentation

## 2024-02-05 DIAGNOSIS — R2681 Unsteadiness on feet: Secondary | ICD-10-CM

## 2024-02-05 DIAGNOSIS — R29818 Other symptoms and signs involving the nervous system: Secondary | ICD-10-CM | POA: Insufficient documentation

## 2024-02-05 DIAGNOSIS — R278 Other lack of coordination: Secondary | ICD-10-CM | POA: Insufficient documentation

## 2024-02-05 DIAGNOSIS — M6281 Muscle weakness (generalized): Secondary | ICD-10-CM

## 2024-02-05 DIAGNOSIS — R41840 Attention and concentration deficit: Secondary | ICD-10-CM | POA: Insufficient documentation

## 2024-02-05 DIAGNOSIS — R2689 Other abnormalities of gait and mobility: Secondary | ICD-10-CM | POA: Insufficient documentation

## 2024-02-05 DIAGNOSIS — R41842 Visuospatial deficit: Secondary | ICD-10-CM | POA: Insufficient documentation

## 2024-02-05 DIAGNOSIS — R41844 Frontal lobe and executive function deficit: Secondary | ICD-10-CM | POA: Insufficient documentation

## 2024-02-05 NOTE — Patient Instructions (Signed)
Tips to reduce freezing episodes with standing or walking:  Stand tall with your feet wide, so that you can rock and weight shift through your hips. Don't try to fight the freeze: if you begin taking slower, faster, smaller steps, STOP, get your posture tall, and RESET your posture and balance.  Take a deep breath before taking the BIG step to start again. March in place, with high knee stepping, to get started walking again. Use auditory cues:  Count out loud, think of a familiar tune or song or cadence, use pocket metronome, to use rhythm to get started walking again. Use visual cues:  Use a line to step over, use laser pointer line to step over, (using BIG steps) to start walking again. Use visual targets to keep your posture tall (look ahead and focus on an object or target at eye level). As you approach where your destination with walking, count your steps out loud and/or focus on your target with your eyes until you are fully there. Use appropriate assistive device, as advised by your physical therapist to assist with taking longer, consistent steps.  
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
## 2024-02-08 ENCOUNTER — Ambulatory Visit: Admitting: Occupational Therapy

## 2024-02-08 ENCOUNTER — Ambulatory Visit: Admitting: Physical Therapy

## 2024-02-08 VITALS — BP 131/87 | HR 68

## 2024-02-08 DIAGNOSIS — R29818 Other symptoms and signs involving the nervous system: Secondary | ICD-10-CM

## 2024-02-08 DIAGNOSIS — R41844 Frontal lobe and executive function deficit: Secondary | ICD-10-CM

## 2024-02-08 DIAGNOSIS — R2681 Unsteadiness on feet: Secondary | ICD-10-CM

## 2024-02-08 DIAGNOSIS — R41842 Visuospatial deficit: Secondary | ICD-10-CM

## 2024-02-08 DIAGNOSIS — R278 Other lack of coordination: Secondary | ICD-10-CM

## 2024-02-08 DIAGNOSIS — R2689 Other abnormalities of gait and mobility: Secondary | ICD-10-CM

## 2024-02-08 DIAGNOSIS — M6281 Muscle weakness (generalized): Secondary | ICD-10-CM

## 2024-02-08 DIAGNOSIS — R41840 Attention and concentration deficit: Secondary | ICD-10-CM
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
## 2024-02-08 NOTE — Patient Instructions (Signed)
 PWR! Hand Exercises Perform each exercise at least 10 repetitions 1x/day, and PWR! PUSH throughout the day when you are having trouble using your hands (picking up small objects, writing, eating, typing, buttoning, etc.). ** Make each movement big and deliberate; feel the movement.  PWR! UP: Fists to open fingers BIG  PWR! Rock:  Move wrists up and down [employer]! Twist: Twist palms up and down BIG  PWR! Step: Step your thumb to index finger while keeping other fingers straight. Flick fingers out BIG (thumb out/straighten fingers). Repeat with other fingers.  PWR! PUSH: Push hands out BIG. Elbows straight, wrists up, fingers open and spread apart BIG.
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
## 2024-02-12 ENCOUNTER — Ambulatory Visit: Admitting: Speech Pathology

## 2024-02-12 ENCOUNTER — Encounter: Payer: Self-pay | Admitting: Speech Pathology

## 2024-02-12 ENCOUNTER — Ambulatory Visit: Admitting: Physical Therapy

## 2024-02-12 ENCOUNTER — Ambulatory Visit: Admitting: Occupational Therapy

## 2024-02-12 ENCOUNTER — Encounter: Payer: Self-pay | Admitting: Occupational Therapy

## 2024-02-12 VITALS — BP 131/88 | HR 67

## 2024-02-12 DIAGNOSIS — M6281 Muscle weakness (generalized): Secondary | ICD-10-CM

## 2024-02-12 DIAGNOSIS — R2689 Other abnormalities of gait and mobility: Secondary | ICD-10-CM

## 2024-02-12 DIAGNOSIS — R278 Other lack of coordination: Secondary | ICD-10-CM

## 2024-02-12 DIAGNOSIS — R29818 Other symptoms and signs involving the nervous system: Secondary | ICD-10-CM

## 2024-02-12 DIAGNOSIS — R41840 Attention and concentration deficit: Secondary | ICD-10-CM

## 2024-02-12 DIAGNOSIS — R41841 Cognitive communication deficit: Secondary | ICD-10-CM

## 2024-02-12 DIAGNOSIS — R41842 Visuospatial deficit: Secondary | ICD-10-CM

## 2024-02-12 DIAGNOSIS — R293 Abnormal posture: Secondary | ICD-10-CM

## 2024-02-12 DIAGNOSIS — R2681 Unsteadiness on feet: Secondary | ICD-10-CM

## 2024-02-12 DIAGNOSIS — R41844 Frontal lobe and executive function deficit: Secondary | ICD-10-CM

## 2024-02-12 DIAGNOSIS — R471 Dysarthria and anarthria: Secondary | ICD-10-CM

## 2024-02-12 NOTE — Patient Instructions (Addendum)
" °  Brian Lloyd ([id number])  [birth year]  [phone]  Practice your address with intent  I'll have Chicken Tika Masala please  I'll take a [facility] as well  I'll take a Dynamite roll and a [place] roll  A [place] please  Let's go to [employer] or [employer]  I'll take the [employer] Special please and a [employer] going, [place]?  Have you been bowling?  What's for dinner?  Where do you want to go eat?         "
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
## 2024-02-15 ENCOUNTER — Ambulatory Visit: Admitting: Physical Therapy

## 2024-02-15 ENCOUNTER — Ambulatory Visit: Admitting: Occupational Therapy

## 2024-02-15 ENCOUNTER — Other Ambulatory Visit: Payer: Self-pay

## 2024-02-15 VITALS — BP 141/86 | HR 69

## 2024-02-15 DIAGNOSIS — R41840 Attention and concentration deficit: Secondary | ICD-10-CM

## 2024-02-15 DIAGNOSIS — R2681 Unsteadiness on feet: Secondary | ICD-10-CM

## 2024-02-15 DIAGNOSIS — M6281 Muscle weakness (generalized): Secondary | ICD-10-CM

## 2024-02-15 DIAGNOSIS — R278 Other lack of coordination: Secondary | ICD-10-CM

## 2024-02-15 DIAGNOSIS — R41842 Visuospatial deficit: Secondary | ICD-10-CM

## 2024-02-15 DIAGNOSIS — R2689 Other abnormalities of gait and mobility: Secondary | ICD-10-CM

## 2024-02-15 DIAGNOSIS — R29818 Other symptoms and signs involving the nervous system: Secondary | ICD-10-CM

## 2024-02-15 DIAGNOSIS — I825Z2 Chronic embolism and thrombosis of unspecified deep veins of left distal lower extremity: Secondary | ICD-10-CM

## 2024-02-15 NOTE — Patient Instructions (Addendum)
 Compensation Strategies for Tremors  When eating, try the following: Eat out of bowls, divided plates, or use a plate guard (available at a medical supply store) and eat with a spoon so that you have an edge to scoop up food. Try raising your plate so that there is less distance between the plate and mouth. Try stabilizing elbows on the table or against your body. Use utensil with built-up/larger grips as they are easier to hold.  When writing, try the following:   Stabilize forearm on the table Take your time as rushing/being stressed can increase tremors. Try a felt-tipped pen, it does not glide as much.  Avoid gel pens (they move too much). Consider using pre-printed labels with your name and address (carry them with you when you go out) or you can get stamps with your address or signature on it. Use a small tape recorder to record messages/reminders for yourself. Use pens with bigger grips.  When brushing your teeth, putting on make-up, or styling hair, try the following: Use an electric toothbrush. Use items with built-up grips. Stabilize your elbows against your body or on the counter. Use long-handled brushes/combs. Use a hair dryer with a stand.  In general: Avoid stress, fatigue, or rushing as this can increase tremors. Sit down for activities that require more control/coordination.  BUTTONING  Open hands big before fastening or unfastening each button. Use deliberate movements - angry buttons.  Button by using push-pull method - push button through hole with one hand and pull the fabric back with the other hand.   Unfasten by using pull-push method - pull the fabric back with one hand and push the button through with the other.
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
## 2024-02-16 ENCOUNTER — Inpatient Hospital Stay: Attending: Hematology & Oncology

## 2024-02-16 ENCOUNTER — Encounter: Payer: Self-pay | Admitting: Hematology & Oncology

## 2024-02-16 ENCOUNTER — Inpatient Hospital Stay: Admitting: Hematology & Oncology

## 2024-02-16 VITALS — BP 139/82 | HR 64 | Temp 98.4°F | Resp 20 | Ht 68.0 in | Wt 172.0 lb

## 2024-02-16 DIAGNOSIS — D6861 Antiphospholipid syndrome: Secondary | ICD-10-CM | POA: Insufficient documentation

## 2024-02-16 DIAGNOSIS — Z7901 Long term (current) use of anticoagulants: Secondary | ICD-10-CM | POA: Diagnosis not present

## 2024-02-16 DIAGNOSIS — G3183 Dementia with Lewy bodies: Secondary | ICD-10-CM | POA: Insufficient documentation

## 2024-02-16 DIAGNOSIS — I825Z2 Chronic embolism and thrombosis of unspecified deep veins of left distal lower extremity: Secondary | ICD-10-CM

## 2024-02-16 DIAGNOSIS — F028 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 without behavioral disturbance: Secondary | ICD-10-CM | POA: Diagnosis not present

## 2024-02-16 DIAGNOSIS — Z79899 Other long term (current) drug therapy: Secondary | ICD-10-CM | POA: Insufficient documentation

## 2024-02-16 DIAGNOSIS — Z7982 Long term (current) use of aspirin: Secondary | ICD-10-CM | POA: Insufficient documentation

## 2024-02-16 LAB — CBC WITH DIFFERENTIAL (CANCER CENTER ONLY)
Abs Immature Granulocytes: 0.03 K/uL (ref 0.00–0.07)
Basophils Absolute: 0 K/uL (ref 0.0–0.1)
Basophils Relative: 1 %
Eosinophils Absolute: 0.1 K/uL (ref 0.0–0.5)
Eosinophils Relative: 2 %
HCT: 41.8 % (ref 39.0–52.0)
Hemoglobin: 14.2 g/dL (ref 13.0–17.0)
Immature Granulocytes: 1 %
Lymphocytes Relative: 19 %
Lymphs Abs: 1 K/uL (ref 0.7–4.0)
MCH: 29.5 pg (ref 26.0–34.0)
MCHC: 34 g/dL (ref 30.0–36.0)
MCV: 86.9 fL (ref 80.0–100.0)
Monocytes Absolute: 0.4 K/uL (ref 0.1–1.0)
Monocytes Relative: 7 %
Neutro Abs: 3.8 K/uL (ref 1.7–7.7)
Neutrophils Relative %: 70 %
Platelet Count: 115 K/uL — ABNORMAL LOW (ref 150–400)
RBC: 4.81 MIL/uL (ref 4.22–5.81)
RDW: 12.3 % (ref 11.5–15.5)
WBC Count: 5.3 K/uL (ref 4.0–10.5)
nRBC: 0 % (ref 0.0–0.2)

## 2024-02-16 LAB — CMP (CANCER CENTER ONLY)
ALT: 34 U/L (ref 0–44)
AST: 32 U/L (ref 15–41)
Albumin: 4.4 g/dL (ref 3.5–5.0)
Alkaline Phosphatase: 49 U/L (ref 38–126)
Anion gap: 11 (ref 5–15)
BUN: 21 mg/dL (ref 8–23)
CO2: 26 mmol/L (ref 22–32)
Calcium: 9.2 mg/dL (ref 8.9–10.3)
Chloride: 103 mmol/L (ref 98–111)
Creatinine: 1.17 mg/dL (ref 0.61–1.24)
GFR, Estimated: >60 mL/min
Glucose, Bld: 104 mg/dL — ABNORMAL HIGH (ref 70–99)
Potassium: 4.5 mmol/L (ref 3.5–5.1)
Sodium: 140 mmol/L (ref 135–145)
Total Bilirubin: 0.5 mg/dL (ref 0.0–1.2)
Total Protein: 7.8 g/dL (ref 6.5–8.1)

## 2024-02-16 LAB — PROTIME-INR
INR: 2.9 — ABNORMAL HIGH (ref 0.8–1.2)
Prothrombin Time: 31.6 s — ABNORMAL HIGH (ref 11.4–15.2)

## 2024-02-16 NOTE — Progress Notes (Signed)
 " Hematology and Oncology Follow Up Visit  Brian Lloyd [id number] Apr 29, 1960 64 y.o. 02/16/2024   Principle Diagnosis:  Antiphospholipid antibody syndrome  Current Therapy:   Lifelong Coumadin /low-dose aspirin  --patient has home INR monitoring system.  Coumadin  clinic is managing the INR.     Interim History:  [name] is in for follow-up.  We last saw him in September.  Since then, he has been doing okay.  He had decent holiday season.  He has wife will hopefully be going to [place] at the end of this month.  He is dealing with the Lewy body dementia.  He seems to be doing pretty well with this.  He drove himself to our office today.  He has his Coumadin  level checked at home.  The last level today was 2.9.  Happy about this.  He has had no bleeding.  He has had no problems with his heart.  He has had no cough or shortness of breath.  There is been no chest pain.  He has had no change in bowel or bladder habits.  He has had no nausea or vomiting.  There is been no leg swelling.  He is still dealing with the arthritis in his neck.  Again, he is managing pretty well.  Overall, I would say that his performance status is probably ECOG 2.        Medications:  Current Outpatient Medications:    acetaminophen  (TYLENOL ) 500 MG tablet, Take 1,000 mg by mouth every 6 (six) hours as needed for moderate pain or headache., Disp: , Rfl:    aspirin  EC 81 MG tablet, Take 81 mg by mouth daily., Disp: , Rfl:    Cholecalciferol (D 1000) 25 MCG (1000 UT) capsule, Take 1 capsule by mouth daily., Disp: , Rfl:    escitalopram (LEXAPRO) 5 MG tablet, Take 5 mg by mouth daily., Disp: , Rfl:    HYDROcodone -acetaminophen  (NORCO/VICODIN) 5-325 MG tablet, Take 2 tablets by mouth every 6 (six) hours as needed., Disp: , Rfl:    Multiple Vitamin (MULTIVITAMINS PO), daily., Disp: , Rfl:    psyllium (METAMUCIL) 58.6 % packet, Take 1 packet by mouth daily., Disp: , Rfl:    Saw Palmetto, Serenoa repens,  (SAW PALMETTO PO), Take 1 capsule by mouth 2 (two) times daily., Disp: , Rfl:    warfarin (COUMADIN ) 5 MG tablet, TAKE 1 AND 1/2 TABLET ON MONDAYS, WEDNESDAYS, and FRIDAYS. ALL OTHER DAYS, TAKE ONLY ONE (1) TABLET., Disp: 100 tablet, Rfl: 1   donepezil (ARICEPT) 10 MG tablet, Take 10 mg by mouth daily. (Patient not taking: Reported on 02/16/2024), Disp: , Rfl:    tetrahydrozoline 0.05 % ophthalmic solution, Place 1 drop into both eyes daily. Visine-Takes only prn. (Patient not taking: Reported on 02/16/2024), Disp: , Rfl:   Allergies:  Allergies  Allergen Reactions   Naproxen Anaphylaxis   Pseudoephedrine-Naproxen Na Er Anaphylaxis    Past Medical History, Surgical history, Social history, and Family History were reviewed and updated.  Review of Systems: Review of Systems  Constitutional: Negative.   HENT:  Negative.    Eyes: Negative.   Respiratory: Negative.    Cardiovascular: Negative.   Gastrointestinal: Negative.   Endocrine: Negative.   Genitourinary: Negative.    Musculoskeletal:  Positive for back pain, myalgias and neck pain.  Skin: Negative.   Neurological:  Positive for dizziness.  Hematological:  Bruises/bleeds easily.  Psychiatric/Behavioral: Negative.      Physical Exam:  Vital signs show temperature of 98.4.  Pulse 64.  Blood pressure 139/82.  Weight is 172 pounds.    Wt Readings from Last 3 Encounters:  02/16/24 172 lb 0.6 oz (78 kg)  10/20/23 163 lb (73.9 kg)  04/21/23 173 lb (78.5 kg)    Physical Exam Vitals reviewed.  HENT:     Head: Normocephalic and atraumatic.  Eyes:     Pupils: Pupils are equal, round, and reactive to light.  Cardiovascular:     Rate and Rhythm: Normal rate and regular rhythm.     Heart sounds: Normal heart sounds.  Pulmonary:     Effort: Pulmonary effort is normal.     Breath sounds: Normal breath sounds.  Abdominal:     General: Bowel sounds are normal.     Palpations: Abdomen is soft.  Musculoskeletal:        General:  No tenderness or deformity. Normal range of motion.     Cervical back: Normal range of motion.  Lymphadenopathy:     Cervical: No cervical adenopathy.  Skin:    General: Skin is warm and dry.     Findings: No erythema or rash.  Neurological:     Mental Status: He is alert and oriented to person, place, and time.  Psychiatric:        Behavior: Behavior normal.        Thought Content: Thought content normal.        Judgment: Judgment normal.      Lab Results  Component Value Date   WBC 5.3 02/16/2024   HGB 14.2 02/16/2024   HCT 41.8 02/16/2024   MCV 86.9 02/16/2024   PLT 115 (L) 02/16/2024     Chemistry      Component Value Date/Time   NA 139 01/15/2024 1217   NA 138 04/18/2016 1209   K 4.3 01/15/2024 1217   CL 104 01/15/2024 1217   CO2 25 01/15/2024 1217   BUN 25 (H) 01/15/2024 1217   BUN 18 04/18/2016 1209   CREATININE 1.19 01/15/2024 1217   CREATININE 1.27 10/26/2015 1114      Component Value Date/Time   CALCIUM  8.9 01/15/2024 1217   ALKPHOS 49 01/15/2024 1217   AST 43 (H) 01/15/2024 1217   ALT 36 01/15/2024 1217   BILITOT 0.5 01/15/2024 1217      Impression and Plan:  [name] is a very nice 64 year old white male.  He has a antiphospholipid antibody syndrome.  He is on lifelong Coumadin  and low-dose aspirin .  His INR is perfect right now.  There will be no change in his Coumadin  dose.  Again, he is dealing with the Lewy body dementia.  I really think he is doing pretty well with this.  It does not look like this is progressing in that quickly.  We will still plan for follow-up in about 4 months or so.   1/16/202612:42 PM  "

## 2024-02-19 ENCOUNTER — Ambulatory Visit: Admitting: Occupational Therapy

## 2024-02-19 ENCOUNTER — Encounter: Payer: Self-pay | Admitting: Occupational Therapy

## 2024-02-19 DIAGNOSIS — R2681 Unsteadiness on feet: Secondary | ICD-10-CM

## 2024-02-19 DIAGNOSIS — M6281 Muscle weakness (generalized): Secondary | ICD-10-CM

## 2024-02-19 DIAGNOSIS — R278 Other lack of coordination: Secondary | ICD-10-CM

## 2024-02-19 DIAGNOSIS — R41842 Visuospatial deficit: Secondary | ICD-10-CM

## 2024-02-19 DIAGNOSIS — R29818 Other symptoms and signs involving the nervous system: Secondary | ICD-10-CM

## 2024-02-19 DIAGNOSIS — R41840 Attention and concentration deficit: Secondary | ICD-10-CM
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
## 2024-02-21 ENCOUNTER — Ambulatory Visit: Admitting: Occupational Therapy

## 2024-02-21 ENCOUNTER — Encounter: Payer: Self-pay | Admitting: Occupational Therapy

## 2024-02-21 DIAGNOSIS — R278 Other lack of coordination: Secondary | ICD-10-CM

## 2024-02-21 DIAGNOSIS — R29818 Other symptoms and signs involving the nervous system: Secondary | ICD-10-CM

## 2024-02-21 DIAGNOSIS — M6281 Muscle weakness (generalized): Secondary | ICD-10-CM

## 2024-02-21 DIAGNOSIS — R41842 Visuospatial deficit: Secondary | ICD-10-CM

## 2024-02-21 DIAGNOSIS — R41840 Attention and concentration deficit: Secondary | ICD-10-CM

## 2024-02-21 DIAGNOSIS — R2681 Unsteadiness on feet: Secondary | ICD-10-CM

## 2024-02-21 NOTE — Patient Instructions (Addendum)
 Ways to prevent future Parkinson's related complications:  1.   Exercise regularly/daily. Challenge yourself SAFELY and try to get different types of exercise including: PWR! Moves, Cardio (cycling), strength and balance training, and stretches (Yoga)   2.   Focus on BIGGER movements during daily activities- really reach overhead, straighten elbows and extend fingers  3.   When dressing (especially jacket/coat) or reaching for your seatbelt make sure to use your body to assist by twisting while you reach and looking at where you are reaching - this can help to minimize stress on the shoulder and reduce the risk of a rotator cuff tear  4.   Swing your arms when you walk (unless using walker)! People with PD are at increased risk for frozen shoulder and swinging your arms can reduce this risk.  5. Drink plenty of water and eat a high fiber diet (fresh fruits and veggies, nuts/seeds, beans, some fish). Avoid canned foods, reduce sugar intake and dairy when possible  6. Do NOT take your Parkinson's medication around meals (take at least 30 min before a meal, or 1 hour after a meal), especially protein as it blocks the absorption of the medicine and will not work effectively  7. Keep your feet apart when you are standing (wider stance) to allow you to have better balance and to reach further with your arms. Also make sure your feet are apart before standing up.   8. Stay social! - Go out with friends, play card games with others, join a community group or community PD exercise class  9. Get a good night's sleep! Staying active during the day and developing a good bedtime routine can help with this.   10. Communicate with your neurologist any concerns and maintain overall health - keep blood pressure regulated, reduce stress/anxiety, good nutrition/gut health, etc.             (Exercise) Monday Tuesday Wednesday Thursday Friday Saturday Sunday   PWR! Hands           Coordination            PWR! Seated           PWR! Standing           Other PT ex's           Speech           Walking
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
## 2024-02-26 ENCOUNTER — Ambulatory Visit: Admitting: Occupational Therapy

## 2024-02-29 ENCOUNTER — Ambulatory Visit: Admitting: Occupational Therapy

## 2024-02-29 ENCOUNTER — Encounter: Payer: Self-pay | Admitting: Occupational Therapy

## 2024-02-29 DIAGNOSIS — R29818 Other symptoms and signs involving the nervous system: Secondary | ICD-10-CM

## 2024-02-29 DIAGNOSIS — R278 Other lack of coordination: Secondary | ICD-10-CM

## 2024-02-29 DIAGNOSIS — M6281 Muscle weakness (generalized): Secondary | ICD-10-CM

## 2024-02-29 DIAGNOSIS — R41840 Attention and concentration deficit: Secondary | ICD-10-CM

## 2024-02-29 NOTE — Patient Instructions (Signed)
 Brian Lloyd
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
## 2024-03-05 ENCOUNTER — Ambulatory Visit

## 2024-03-05 ENCOUNTER — Encounter: Payer: Self-pay | Admitting: Occupational Therapy

## 2024-03-05 ENCOUNTER — Ambulatory Visit: Admitting: Occupational Therapy

## 2024-03-05 DIAGNOSIS — R41841 Cognitive communication deficit: Secondary | ICD-10-CM

## 2024-03-05 DIAGNOSIS — R471 Dysarthria and anarthria: Secondary | ICD-10-CM

## 2024-03-05 DIAGNOSIS — R2681 Unsteadiness on feet: Secondary | ICD-10-CM

## 2024-03-05 DIAGNOSIS — M6281 Muscle weakness (generalized): Secondary | ICD-10-CM

## 2024-03-05 DIAGNOSIS — R278 Other lack of coordination: Secondary | ICD-10-CM

## 2024-03-05 DIAGNOSIS — R41842 Visuospatial deficit: Secondary | ICD-10-CM

## 2024-03-05 DIAGNOSIS — R41840 Attention and concentration deficit: Secondary | ICD-10-CM

## 2024-03-05 DIAGNOSIS — R29818 Other symptoms and signs involving the nervous system: Secondary | ICD-10-CM

## 2024-03-07 ENCOUNTER — Ambulatory Visit: Admitting: Occupational Therapy

## 2024-03-12 ENCOUNTER — Ambulatory Visit: Admitting: Occupational Therapy

## 2024-03-12 ENCOUNTER — Ambulatory Visit

## 2024-03-14 ENCOUNTER — Ambulatory Visit: Admitting: Occupational Therapy

## 2024-03-18 ENCOUNTER — Ambulatory Visit: Admitting: Speech Pathology

## 2024-03-20 ENCOUNTER — Ambulatory Visit: Admitting: Speech Pathology

## 2024-03-25 ENCOUNTER — Ambulatory Visit

## 2024-03-27 ENCOUNTER — Ambulatory Visit: Admitting: Speech Pathology

## 2024-06-14 ENCOUNTER — Inpatient Hospital Stay

## 2024-06-14 ENCOUNTER — Inpatient Hospital Stay: Admitting: Hematology & Oncology
